# Patient Record
Sex: Female | Born: 1960
Health system: Southern US, Community
[De-identification: ages and names within clinical notes are randomized; demographics above are authoritative.]

## PROBLEM LIST (undated history)

## (undated) DIAGNOSIS — R079 Chest pain, unspecified: Secondary | ICD-10-CM

## (undated) DIAGNOSIS — K219 Gastro-esophageal reflux disease without esophagitis: Secondary | ICD-10-CM

## (undated) DIAGNOSIS — M51369 Other intervertebral disc degeneration, lumbar region without mention of lumbar back pain or lower extremity pain: Secondary | ICD-10-CM

## (undated) DIAGNOSIS — F419 Anxiety disorder, unspecified: Secondary | ICD-10-CM

## (undated) DIAGNOSIS — M25541 Pain in joints of right hand: Secondary | ICD-10-CM

## (undated) DIAGNOSIS — E039 Hypothyroidism, unspecified: Secondary | ICD-10-CM

## (undated) DIAGNOSIS — I10 Essential (primary) hypertension: Secondary | ICD-10-CM

## (undated) DIAGNOSIS — M5136 Other intervertebral disc degeneration, lumbar region: Secondary | ICD-10-CM

## (undated) DIAGNOSIS — F32A Depression, unspecified: Secondary | ICD-10-CM

## (undated) DIAGNOSIS — M542 Cervicalgia: Secondary | ICD-10-CM

## (undated) DIAGNOSIS — M549 Dorsalgia, unspecified: Secondary | ICD-10-CM

## (undated) DIAGNOSIS — R0602 Shortness of breath: Secondary | ICD-10-CM

## (undated) DIAGNOSIS — M25542 Pain in joints of left hand: Secondary | ICD-10-CM

## (undated) DIAGNOSIS — K5900 Constipation, unspecified: Secondary | ICD-10-CM

## (undated) DIAGNOSIS — M791 Myalgia, unspecified site: Secondary | ICD-10-CM

## (undated) DIAGNOSIS — R5383 Other fatigue: Secondary | ICD-10-CM

## (undated) DIAGNOSIS — F329 Major depressive disorder, single episode, unspecified: Secondary | ICD-10-CM

## (undated) HISTORY — DX: Chest pain, unspecified: R07.9

## (undated) HISTORY — DX: Major depressive disorder, single episode, unspecified: F32.9

## (undated) HISTORY — DX: Other intervertebral disc degeneration, lumbar region without mention of lumbar back pain or lower extremity pain: M51.369

## (undated) HISTORY — DX: Essential (primary) hypertension: I10

## (undated) HISTORY — DX: Dorsalgia, unspecified: M54.9

## (undated) HISTORY — DX: Myalgia, unspecified site: M79.10

## (undated) HISTORY — DX: Other fatigue: R53.83

## (undated) HISTORY — DX: Other intervertebral disc degeneration, lumbar region: M51.36

## (undated) HISTORY — DX: Morbid (severe) obesity due to excess calories: E66.01

## (undated) HISTORY — DX: Shortness of breath: R06.02

## (undated) HISTORY — DX: Depression, unspecified: F32.A

## (undated) HISTORY — PX: TUBAL LIGATION: SHX77

## (undated) HISTORY — DX: Pain in joints of right hand: M25.542

## (undated) HISTORY — DX: Constipation, unspecified: K59.00

## (undated) HISTORY — DX: Pain in joints of right hand: M25.541

## (undated) HISTORY — PX: KNEE SURGERY: SHX244

## (undated) HISTORY — DX: Anxiety disorder, unspecified: F41.9

## (undated) HISTORY — PX: CHOLECYSTECTOMY: SHX55

---

## 1999-10-05 ENCOUNTER — Emergency Department (HOSPITAL_COMMUNITY): Admission: EM | Admit: 1999-10-05 | Discharge: 1999-10-05 | Payer: Self-pay | Admitting: Emergency Medicine

## 2009-07-08 ENCOUNTER — Inpatient Hospital Stay: Payer: Self-pay | Admitting: Surgery

## 2009-08-22 ENCOUNTER — Inpatient Hospital Stay: Payer: Self-pay | Admitting: Internal Medicine

## 2009-10-15 ENCOUNTER — Ambulatory Visit: Payer: Self-pay | Admitting: Gastroenterology

## 2010-05-20 ENCOUNTER — Ambulatory Visit: Payer: Self-pay

## 2010-05-29 ENCOUNTER — Ambulatory Visit: Payer: Self-pay | Admitting: Family Medicine

## 2011-06-02 ENCOUNTER — Ambulatory Visit: Payer: Self-pay

## 2013-01-17 ENCOUNTER — Ambulatory Visit: Payer: Self-pay

## 2013-11-01 ENCOUNTER — Ambulatory Visit (INDEPENDENT_AMBULATORY_CARE_PROVIDER_SITE_OTHER): Payer: BC Managed Care – PPO | Admitting: Cardiovascular Disease

## 2013-11-01 ENCOUNTER — Encounter: Payer: Self-pay | Admitting: Cardiovascular Disease

## 2013-11-01 ENCOUNTER — Encounter (INDEPENDENT_AMBULATORY_CARE_PROVIDER_SITE_OTHER): Payer: Self-pay

## 2013-11-01 VITALS — BP 148/94 | HR 73 | Ht 64.0 in | Wt 180.5 lb

## 2013-11-01 VITALS — BP 140/82 | HR 68 | Ht 64.0 in | Wt 180.5 lb

## 2013-11-01 DIAGNOSIS — M542 Cervicalgia: Secondary | ICD-10-CM

## 2013-11-01 DIAGNOSIS — R5383 Other fatigue: Secondary | ICD-10-CM

## 2013-11-01 DIAGNOSIS — R5381 Other malaise: Secondary | ICD-10-CM

## 2013-11-01 DIAGNOSIS — R0602 Shortness of breath: Secondary | ICD-10-CM | POA: Insufficient documentation

## 2013-11-01 DIAGNOSIS — R000 Tachycardia, unspecified: Secondary | ICD-10-CM

## 2013-11-01 NOTE — Procedures (Signed)
Exercise Treadmill Test  Treadmill ordered for recent epsiodes of chest pain.  Resting EKG shows NSR with rate of 69 bpm, no significant ST or T wave changes Resting blood pressure of 148/94. Stand bruce protocal was used.  Patient exercised for 9 min,  Peak heart rate of 146 bpm.  This was 88% of the maximum predicted heart rate (target heart rate 141). Achieved 10.1 METS No symptoms of chest pain or lightheadedness were reported at peak stress or in recovery.  Peak Blood pressure recorded was 198/90 Heart rate at 3 minutes in recovery was 86 bpm. No significant ST changes concerning for ischemia.   FINAL IMPRESSION: Normal exercise stress test. No significant EKG changes concerning for ischemia. Good exercise tolerance.

## 2013-11-01 NOTE — Patient Instructions (Signed)
  No medication changes were made. For shortness of breath, fatigue, we will schedule a routine treadmill study   Please call us if you have new issues that need to be addressed before your next appt.

## 2013-11-01 NOTE — Assessment & Plan Note (Signed)
Neck pain sounds atypical in nature. Suggest she try Aleve or other NSAIDs. We'll order routine treadmill study to exclude ischemia

## 2013-11-01 NOTE — Progress Notes (Signed)
Patient ID: Emily [name], female    DOB: 1961-01-13, 53 y.o.   MRN: [id number]  HPI Comments: [name] is a very pleasant 53 year old woman with history of depression, hypertension, obesity is a patient of Dr. Dossie Arbourrissman who presents for evaluation of shortness of breath, pain in her neck and jaw.  She reports that her husband has a long history of coronary artery disease and given her symptoms over the past several weeks, he encouraged her to be evaluated in our office. She reports that over the past several weeks she has had episodes of neck pain on the left radiating up to her jaw. She denies any heavy lifting, no trauma to her neck. Denies having reproducible neck pain or jaw pain or chest pain with exertion. She does have shortness of breath but is uncertain if this is from weight gain of 25 pounds over the past year. Recently had a honeymoon and noticed shortness of breath on [place] trip to [place]. She also feels tired much of the time and is concerned her fatigue could be from heart disease.   She does report a significant family history. Father had MI in his 360s, other grandparents had heart attacks in their 4960s. Mother had stroke and heart failure in her 7070s  No significant smoking history apart from when she was very young, no diabetes history  EKG shows normal sinus rhythm with heart rates in the 60s, no significant ST or T wave changes   Outpatient Encounter Prescriptions as of 11/01/2013  Medication Sig  . levothyroxine (SYNTHROID, LEVOTHROID) 75 MCG tablet Take 75 mcg by mouth daily before breakfast.   . losartan (COZAAR) 50 MG tablet Take 50 mg by mouth daily.  . Nutritional Supplements (JUICE PLUS FIBRE PO) Take by mouth daily.  Marland Kitchen. venlafaxine XR (EFFEXOR-XR) 75 MG 24 hr capsule Take 75 mg by mouth daily with breakfast.     Review of Systems  Constitutional: Negative.   HENT: Negative.   Eyes: Negative.   Respiratory: Positive for shortness of breath.   Cardiovascular: Negative.    Gastrointestinal: Negative.   Endocrine: Negative.   Musculoskeletal: Positive for neck pain.  Skin: Negative.   Allergic/Immunologic: Negative.   Neurological: Negative.   Hematological: Negative.   Psychiatric/Behavioral: Negative.   All other systems reviewed and are negative.   BP 140/82  Pulse 68  Ht 5\' 4"  (1.626 m)  Wt 180 lb 8 oz (81.874 kg)  BMI 30.97 kg/m2  Physical Exam  Nursing note and vitals reviewed. Constitutional: She is oriented to person, place, and time. She appears well-developed and well-nourished.  HENT:  Head: Normocephalic.  Nose: Nose normal.  Mouth/Throat: Oropharynx is clear and moist.  Eyes: Conjunctivae are normal. Pupils are equal, round, and reactive to light.  Neck: Normal range of motion. Neck supple. No JVD present.  Cardiovascular: Normal rate, regular rhythm, S1 normal, S2 normal, normal heart sounds and intact distal pulses.  Exam reveals no gallop and no friction rub.   No murmur heard. Pulmonary/Chest: Effort normal and breath sounds normal. No respiratory distress. She has no wheezes. She has no rales. She exhibits no tenderness.  Abdominal: Soft. Bowel sounds are normal. She exhibits no distension. There is no tenderness.  Musculoskeletal: Normal range of motion. She exhibits no edema and no tenderness.  Lymphadenopathy:    She has no cervical adenopathy.  Neurological: She is alert and oriented to person, place, and time. Coordination normal.  Skin: Skin is warm and dry. No rash noted.  No erythema.  Psychiatric: She has a normal mood and affect. Her behavior is normal. Judgment and thought content normal.    Assessment and Plan

## 2013-11-01 NOTE — Patient Instructions (Signed)
You are doing well. No medication changes were made.  Your stress test did not show significant changes concerning for heart blockage. No further testing at this time.  Please call us if you have new issues that need to be addressed before your next appt.

## 2013-11-01 NOTE — Assessment & Plan Note (Signed)
Etiology of shortness of breath is unclear. She reports 25 pound weight gain over the past year. She does not do any regular exercise. Routine treadmill study has been ordered to exclude cardiac source of her shortness of breath. Clinical exam is essentially benign, EKG normal.

## 2013-11-01 NOTE — Assessment & Plan Note (Signed)
Etiology of her fatigue is unclear. She reports good sleep hygiene at nighttime. Uncertain if this could be from history of depression. Unable to exclude ischemia as a cause of her symptoms. We will do a routine treadmill study today to exclude cardiac etiology

## 2014-03-03 ENCOUNTER — Encounter: Payer: Self-pay | Admitting: Neurology

## 2014-03-03 ENCOUNTER — Ambulatory Visit (INDEPENDENT_AMBULATORY_CARE_PROVIDER_SITE_OTHER): Payer: BC Managed Care – PPO | Admitting: Neurology

## 2014-03-03 VITALS — BP 122/70 | HR 74 | Temp 98.0°F | Resp 18 | Ht 64.0 in | Wt 190.6 lb

## 2014-03-03 DIAGNOSIS — R413 Other amnesia: Secondary | ICD-10-CM

## 2014-03-03 DIAGNOSIS — R209 Unspecified disturbances of skin sensation: Secondary | ICD-10-CM

## 2014-03-03 DIAGNOSIS — G5710 Meralgia paresthetica, unspecified lower limb: Secondary | ICD-10-CM

## 2014-03-03 DIAGNOSIS — R200 Anesthesia of skin: Secondary | ICD-10-CM

## 2014-03-03 DIAGNOSIS — G5711 Meralgia paresthetica, right lower limb: Secondary | ICD-10-CM

## 2014-03-03 DIAGNOSIS — B0229 Other postherpetic nervous system involvement: Secondary | ICD-10-CM

## 2014-03-03 DIAGNOSIS — M2550 Pain in unspecified joint: Secondary | ICD-10-CM

## 2014-03-03 NOTE — Patient Instructions (Signed)
1.  We will check a vitamin B12 level. 2.  We will refer you for neuropsychological testing.  At this point I don't think you have dementia or cognitive impairment and it may be due to stress and anxiety. 3.  Recommend wearing wrist splints at night for hand numbness.  It may be carpal tunnel.  Can get them at any drug store. 4.  For the thigh numbness, recommend routine exercise and losing weight. 5.  Have rheumatologist send me his note. 6.  Will see you after the neuropsych testing.
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
## 2014-03-04 LAB — VITAMIN B12: Vitamin B-12: 546 pg/mL (ref 211–911)

## 2014-03-06 LAB — B. BURGDORFI ANTIBODIES: B burgdorferi Ab IgG+IgM: 0.45 {ISR}

## 2014-10-24 ENCOUNTER — Emergency Department: Payer: Self-pay | Admitting: Emergency Medicine

## 2014-10-24 LAB — BASIC METABOLIC PANEL
ANION GAP: 6 — AB (ref 7–16)
BUN: 21 mg/dL — ABNORMAL HIGH (ref 7–18)
CALCIUM: 8.8 mg/dL (ref 8.5–10.1)
CO2: 27 mmol/L (ref 21–32)
CREATININE: 0.76 mg/dL (ref 0.60–1.30)
Chloride: 106 mmol/L (ref 98–107)
GLUCOSE: 108 mg/dL — AB (ref 65–99)
Osmolality: 281 (ref 275–301)
Potassium: 3.9 mmol/L (ref 3.5–5.1)
SODIUM: 139 mmol/L (ref 136–145)

## 2014-10-24 LAB — CBC
HCT: 39.3 % (ref 35.0–47.0)
HGB: 13.1 g/dL (ref 12.0–16.0)
MCH: 31.3 pg (ref 26.0–34.0)
MCHC: 33.3 g/dL (ref 32.0–36.0)
MCV: 94 fL (ref 80–100)
Platelet: 211 10*3/uL (ref 150–440)
RBC: 4.18 10*6/uL (ref 3.80–5.20)
RDW: 13.2 % (ref 11.5–14.5)
WBC: 5.6 10*3/uL (ref 3.6–11.0)

## 2014-10-24 LAB — TROPONIN I: Troponin-I: <0.02 ng/mL

## 2014-11-17 ENCOUNTER — Ambulatory Visit: Payer: Self-pay | Admitting: Family Medicine

## 2014-12-28 ENCOUNTER — Ambulatory Visit (INDEPENDENT_AMBULATORY_CARE_PROVIDER_SITE_OTHER): Payer: BLUE CROSS/BLUE SHIELD

## 2014-12-28 ENCOUNTER — Ambulatory Visit (INDEPENDENT_AMBULATORY_CARE_PROVIDER_SITE_OTHER): Payer: BLUE CROSS/BLUE SHIELD | Admitting: Podiatry

## 2014-12-28 ENCOUNTER — Encounter: Payer: Self-pay | Admitting: Podiatry

## 2014-12-28 DIAGNOSIS — R52 Pain, unspecified: Secondary | ICD-10-CM

## 2014-12-28 DIAGNOSIS — M722 Plantar fascial fibromatosis: Secondary | ICD-10-CM

## 2014-12-28 MED ORDER — MELOXICAM 7.5 MG PO TABS: 7.5000 mg | ORAL_TABLET | Freq: Every day | ORAL | Status: DC

## 2014-12-28 MED ORDER — TRIAMCINOLONE ACETONIDE 10 MG/ML IJ SUSP
10.0000 mg | Freq: Once | INTRAMUSCULAR | Status: AC
  Administered 2014-12-29: 10 mg

## 2014-12-28 NOTE — Progress Notes (Signed)
   Subjective:    Patient ID: Emily [name], female    DOB: [birth year], 54 y.o.   MRN: [id number]  HPI 54 year old female presents the office today with her husband for bilateral heel pain which has been ongoing for approximately 3-4 months. She states that she has pain in the bottoms of her feet particularly the heel which is worse in the morning or after periods of rest which relieved by ambulation. 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 She denies any history of injury to the area or any change or increase in activity of the time of onset of symptoms. Denies any to wear numbness to her feet. The pain does not wake her up at night. She has tried stretching activities as well as soaking her foot without any resolution. Denies any swelling or redness to the area. No other complaints at this time.  Review of Systems  All other systems reviewed and are negative.      Objective:   Physical Exam AAO x3, NAD DP/PT pulses palpable bilaterally, CRT less than 3 seconds Protective sensation intact with Simms Weinstein monofilament, vibratory sensation intact, Achilles tendon reflex intact Tenderness to palpation overlying the plantar medial tubercle of the calcaneus to bilateral heels at the insertion of the plantar fascia. There is no pain along the course of plantar fascia within the arch of the foot and the plantar fascia appears intact bilaterally. There is no pain with lateral compression of the calcaneus or pain the vibratory sensation. No pain on the posterior aspect of the calcaneus or along the course/insertion of the Achilles tendon. There is no overlying edema, erythema, increase in warmth. No other areas of tenderness palpation or pain with vibratory sensation to the foot/ankle b/l. MMT 5/5, ROM WNL No open lesions or pre-ulcerative lesions are identified. No pain with calf compression, swelling, warmth, erythema.      Assessment  & Plan:  54 year old female with bilateral heel pain, likely plantar fasciitis. -X-rays were obtained and reviewed with the patient. -Treatment options were discussed with the patient including all alternatives, risks, complications. -Patient elects to proceed with steroid injection into the bilateral heels. Under sterile skin preparation, a total of 2.5cc of kenalog 10, 0.5% Marcaine plain, and 2% lidocaine plain were infiltrated into the symptomatic area without complication. A band-aid was applied. Patient tolerated the injection well without complication. Post-injection care with discussed with the patient. Discussed with the patient to ice the area over the next couple of days to help prevent a steroid flare.  -Plantar fascial tapings were applied. Also dispensed plantar fascial braces to wear once the tape is removed. -Prescribed meloxicam. Discussed side effects the medication directed to stop if any are to occur. -Continue stretching and icing activities. -Discussed shoe gear modifications. -Discussed orthotics. Will discuss more next appointment as symptoms resolve. -Follow-up in 3 weeks or sooner if any problems are to arise. In the meantime encouraged to call the office with any questions, concerns, change in symptoms.

## 2014-12-28 NOTE — Patient Instructions (Signed)
Plantar Fasciitis (Heel Spur Syndrome) with Rehab The plantar fascia is a fibrous, ligament-like, soft-tissue structure that spans the bottom of the foot. Plantar fasciitis is a condition that causes pain in the foot due to inflammation of the tissue. SYMPTOMS   Pain and tenderness on the underneath side of the foot.  Pain that worsens with standing or walking. CAUSES  Plantar fasciitis is caused by irritation and injury to the plantar fascia on the underneath side of the foot. Common mechanisms of injury include:  Direct trauma to bottom of the foot.  Damage to a small nerve that runs under the foot where the main fascia attaches to the heel bone.  Stress placed on the plantar fascia due to bone spurs. RISK INCREASES WITH:   Activities that place stress on the plantar fascia (running, jumping, pivoting, or cutting).  Poor strength and flexibility.  Improperly fitted shoes.  Tight calf muscles.  Flat feet.  Failure to warm-up properly before activity.  Obesity. PREVENTION  Warm up and stretch properly before activity.  Allow for adequate recovery between workouts.  Maintain physical fitness:  Strength, flexibility, and endurance.  Cardiovascular fitness.  Maintain a health body weight.  Avoid stress on the plantar fascia.  Wear properly fitted shoes, including arch supports for individuals who have flat feet. PROGNOSIS  If treated properly, then the symptoms of plantar fasciitis usually resolve without surgery. However, occasionally surgery is necessary. RELATED COMPLICATIONS   Recurrent symptoms that may result in a chronic condition.  Problems of the lower back that are caused by compensating for the injury, such as limping.  Pain or weakness of the foot during push-off following surgery.  Chronic inflammation, scarring, and partial or complete fascia tear, occurring more often from repeated injections. TREATMENT  Treatment initially involves the use of  ice and medication to help reduce pain and inflammation. The use of strengthening and stretching exercises may help reduce pain with activity, especially stretches of the Achilles tendon. These exercises may be performed at home or with a therapist. Your caregiver may recommend that you use heel cups of arch supports to help reduce stress on the plantar fascia. Occasionally, corticosteroid injections are given to reduce inflammation. If symptoms persist for greater than 6 months despite non-surgical (conservative), then surgery may be recommended.  MEDICATION   If pain medication is necessary, then nonsteroidal anti-inflammatory medications, such as aspirin and ibuprofen, or other minor pain relievers, such as acetaminophen, are often recommended.  Do not take pain medication within 7 days before surgery.  Prescription pain relievers may be given if deemed necessary by your caregiver. Use only as directed and only as much as you need.  Corticosteroid injections may be given by your caregiver. These injections should be reserved for the most serious cases, because they may only be given a certain number of times. HEAT AND COLD  Cold treatment (icing) relieves pain and reduces inflammation. Cold treatment should be applied for 10 to 15 minutes every 2 to 3 hours for inflammation and pain and immediately after any activity that aggravates your symptoms. Use ice packs or massage the area with a piece of ice (ice massage).  Heat treatment may be used prior to performing the stretching and strengthening activities prescribed by your caregiver, physical therapist, or athletic trainer. Use a heat pack or soak the injury in warm water. SEEK IMMEDIATE MEDICAL CARE IF:  Treatment seems to offer no benefit, or the condition worsens.  Any medications produce adverse side effects. EXERCISES RANGE   OF MOTION (ROM) AND STRETCHING EXERCISES - Plantar Fasciitis (Heel Spur Syndrome) These exercises may help you  when beginning to rehabilitate your injury. Your symptoms may resolve with or without further involvement from your physician, physical therapist or athletic trainer. While completing these exercises, remember:   Restoring tissue flexibility helps normal motion to return to the joints. This allows healthier, less painful movement and activity.  An effective stretch should be held for at least 30 seconds.  A stretch should never be painful. You should only feel a gentle lengthening or release in the stretched tissue. RANGE OF MOTION - Toe Extension, Flexion  Sit with your right / left leg crossed over your opposite knee.  Grasp your toes and gently pull them back toward the top of your foot. You should feel a stretch on the bottom of your toes and/or foot.  Hold this stretch for __________ seconds.  Now, gently pull your toes toward the bottom of your foot. You should feel a stretch on the top of your toes and or foot.  Hold this stretch for __________ seconds. Repeat __________ times. Complete this stretch __________ times per day.  RANGE OF MOTION - Ankle Dorsiflexion, Active Assisted  Remove shoes and sit on a chair that is preferably not on a carpeted surface.  Place right / left foot under knee. Extend your opposite leg for support.  Keeping your heel down, slide your right / left foot back toward the chair until you feel a stretch at your ankle or calf. If you do not feel a stretch, slide your bottom forward to the edge of the chair, while still keeping your heel down.  Hold this stretch for __________ seconds. Repeat __________ times. Complete this stretch __________ times per day.  STRETCH - Gastroc, Standing  Place hands on wall.  Extend right / left leg, keeping the front knee somewhat bent.  Slightly point your toes inward on your back foot.  Keeping your right / left heel on the floor and your knee straight, shift your weight toward the wall, not allowing your back to  arch.  You should feel a gentle stretch in the right / left calf. Hold this position for __________ seconds. Repeat __________ times. Complete this stretch __________ times per day. STRETCH - Soleus, Standing  Place hands on wall.  Extend right / left leg, keeping the other knee somewhat bent.  Slightly point your toes inward on your back foot.  Keep your right / left heel on the floor, bend your back knee, and slightly shift your weight over the back leg so that you feel a gentle stretch deep in your back calf.  Hold this position for __________ seconds. Repeat __________ times. Complete this stretch __________ times per day. STRETCH - Gastrocsoleus, Standing  Note: This exercise can place a lot of stress on your foot and ankle. Please complete this exercise only if specifically instructed by your caregiver.   Place the ball of your right / left foot on a step, keeping your other foot firmly on the same step.  Hold on to the wall or a rail for balance.  Slowly lift your other foot, allowing your body weight to press your heel down over the edge of the step.  You should feel a stretch in your right / left calf.  Hold this position for __________ seconds.  Repeat this exercise with a slight bend in your right / left knee. Repeat __________ times. Complete this stretch __________ times per day.    STRENGTHENING EXERCISES - Plantar Fasciitis (Heel Spur Syndrome)  These exercises may help you when beginning to rehabilitate your injury. They may resolve your symptoms with or without further involvement from your physician, physical therapist or athletic trainer. While completing these exercises, remember:   Muscles can gain both the endurance and the strength needed for everyday activities through controlled exercises.  Complete these exercises as instructed by your physician, physical therapist or athletic trainer. Progress the resistance and repetitions only as guided. STRENGTH -  Towel Curls  Sit in a chair positioned on a non-carpeted surface.  Place your foot on a towel, keeping your heel on the floor.  Pull the towel toward your heel by only curling your toes. Keep your heel on the floor.  If instructed by your physician, physical therapist or athletic trainer, add ____________________ at the end of the towel. Repeat __________ times. Complete this exercise __________ times per day. STRENGTH - Ankle Inversion  Secure one end of a rubber exercise band/tubing to a fixed object (table, pole). Loop the other end around your foot just before your toes.  Place your fists between your knees. This will focus your strengthening at your ankle.  Slowly, pull your big toe up and in, making sure the band/tubing is positioned to resist the entire motion.  Hold this position for __________ seconds.  Have your muscles resist the band/tubing as it slowly pulls your foot back to the starting position. Repeat __________ times. Complete this exercises __________ times per day.  Document Released: 10/06/2005 Document Revised: 12/29/2011 Document Reviewed: 01/18/2009 [employer] Patient Information 2015 [employer]. This information is not intended to replace advice given to you by your health care provider. Make sure you discuss any questions you have with your health care provider.  

## 2014-12-29 DIAGNOSIS — M722 Plantar fascial fibromatosis: Secondary | ICD-10-CM | POA: Diagnosis not present

## 2015-01-18 ENCOUNTER — Encounter: Payer: Self-pay | Admitting: Podiatry

## 2015-01-18 ENCOUNTER — Ambulatory Visit (INDEPENDENT_AMBULATORY_CARE_PROVIDER_SITE_OTHER): Payer: BLUE CROSS/BLUE SHIELD | Admitting: Podiatry

## 2015-01-18 VITALS — BP 127/68 | HR 85 | Resp 16

## 2015-01-18 DIAGNOSIS — M722 Plantar fascial fibromatosis: Secondary | ICD-10-CM | POA: Diagnosis not present

## 2015-01-18 MED ORDER — TRIAMCINOLONE ACETONIDE 10 MG/ML IJ SUSP
10.0000 mg | Freq: Once | INTRAMUSCULAR | Status: AC
  Administered 2015-01-18: 10 mg

## 2015-01-18 NOTE — Progress Notes (Signed)
Patient ID: Emily [name], female   DOB: 1961/04/16, 54 y.o.   MRN: [id number]  Subjective: 54 year old female presents the office today follow-up evaluation of bilateral heel pain, plantar fasciitis. She states that since last appointment she does have some reduction symptoms her she does continue to have pain. She says that she has pain in the morning or after periods of rest which is relieved by ambulation. Denies any numbness or tingling. Denies any swelling or redness. She's continue the stretching, icing, plantar fascial brace, anti-inflammatory. No other complaints at this time.  Objective: AAO x3, NAD DP/PT pulses palpable bilaterally, CRT less than 3 seconds Protective sensation intact with Simms Weinstein monofilament, vibratory sensation intact, Achilles There is continued tenderness to palpation overlying the plantar medial tubercle of the calcaneus to bilateral heels with the left > right at the insertion of the plantar fascia, although it does appear improved. There is no pain along the course of plantar fascia within the arch of the foot. There is no pain with lateral compression of the calcaneus or pain the vibratory sensation. No pain on the posterior aspect of the calcaneus or along the course/insertion of the Achilles tendon. There is mild equinus bilaterally.There is no overlying edema, erythema, increase in warmth. No other areas of tenderness palpation or pain with vibratory sensation to the foot/ankle. MMT 5/5, ROM WNL No open lesions or pre-ulcerative lesions are identified. No pain with calf compression, swelling, warmth, erythema.  Assessment: 54 year old female with continued heel pain bilaterally although resolving (left greater than right).  Plan: -Treatment options were discussed including alternatives, risks, complications. -Patient elects to proceed with steroid injection into the bilateral heels. Under sterile skin preparation, a total of 2.5cc of kenalog 10,  0.5% Marcaine plain, and 2% lidocaine plain were infiltrated into the symptomatic area without complication. A band-aid was applied. Patient tolerated the injection well without complication. Post-injection care with discussed with the patient. Discussed with the patient to ice the area over the next couple of days to help prevent a steroid flare.  -Plantar fascial taken and applied. At the same was removed she can continue with the plantar fascial brace. -Continue anti-inflammatory -Ice to the area -Continue stretching activities -Dispensed night splint -Discussed shoe gear modifications -Follow up in 3 weeks or sooner if any problems are to arise. In the meantime encouraged to call the office with any questions, concerns, change in symptoms.

## 2015-01-18 NOTE — Patient Instructions (Signed)
Plantar Fasciitis (Heel Spur Syndrome) with Rehab The plantar fascia is a fibrous, ligament-like, soft-tissue structure that spans the bottom of the foot. Plantar fasciitis is a condition that causes pain in the foot due to inflammation of the tissue. SYMPTOMS   Pain and tenderness on the underneath side of the foot.  Pain that worsens with standing or walking. CAUSES  Plantar fasciitis is caused by irritation and injury to the plantar fascia on the underneath side of the foot. Common mechanisms of injury include:  Direct trauma to bottom of the foot.  Damage to a small nerve that runs under the foot where the main fascia attaches to the heel bone.  Stress placed on the plantar fascia due to bone spurs. RISK INCREASES WITH:   Activities that place stress on the plantar fascia (running, jumping, pivoting, or cutting).  Poor strength and flexibility.  Improperly fitted shoes.  Tight calf muscles.  Flat feet.  Failure to warm-up properly before activity.  Obesity. PREVENTION  Warm up and stretch properly before activity.  Allow for adequate recovery between workouts.  Maintain physical fitness:  Strength, flexibility, and endurance.  Cardiovascular fitness.  Maintain a health body weight.  Avoid stress on the plantar fascia.  Wear properly fitted shoes, including arch supports for individuals who have flat feet. PROGNOSIS  If treated properly, then the symptoms of plantar fasciitis usually resolve without surgery. However, occasionally surgery is necessary. RELATED COMPLICATIONS   Recurrent symptoms that may result in a chronic condition.  Problems of the lower back that are caused by compensating for the injury, such as limping.  Pain or weakness of the foot during push-off following surgery.  Chronic inflammation, scarring, and partial or complete fascia tear, occurring more often from repeated injections. TREATMENT  Treatment initially involves the use of  ice and medication to help reduce pain and inflammation. The use of strengthening and stretching exercises may help reduce pain with activity, especially stretches of the Achilles tendon. These exercises may be performed at home or with a therapist. Your caregiver may recommend that you use heel cups of arch supports to help reduce stress on the plantar fascia. Occasionally, corticosteroid injections are given to reduce inflammation. If symptoms persist for greater than 6 months despite non-surgical (conservative), then surgery may be recommended.  MEDICATION   If pain medication is necessary, then nonsteroidal anti-inflammatory medications, such as aspirin and ibuprofen, or other minor pain relievers, such as acetaminophen, are often recommended.  Do not take pain medication within 7 days before surgery.  Prescription pain relievers may be given if deemed necessary by your caregiver. Use only as directed and only as much as you need.  Corticosteroid injections may be given by your caregiver. These injections should be reserved for the most serious cases, because they may only be given a certain number of times. HEAT AND COLD  Cold treatment (icing) relieves pain and reduces inflammation. Cold treatment should be applied for 10 to 15 minutes every 2 to 3 hours for inflammation and pain and immediately after any activity that aggravates your symptoms. Use ice packs or massage the area with a piece of ice (ice massage).  Heat treatment may be used prior to performing the stretching and strengthening activities prescribed by your caregiver, physical therapist, or athletic trainer. Use a heat pack or soak the injury in warm water. SEEK IMMEDIATE MEDICAL CARE IF:  Treatment seems to offer no benefit, or the condition worsens.  Any medications produce adverse side effects. EXERCISES RANGE   OF MOTION (ROM) AND STRETCHING EXERCISES - Plantar Fasciitis (Heel Spur Syndrome) These exercises may help you  when beginning to rehabilitate your injury. Your symptoms may resolve with or without further involvement from your physician, physical therapist or athletic trainer. While completing these exercises, remember:   Restoring tissue flexibility helps normal motion to return to the joints. This allows healthier, less painful movement and activity.  An effective stretch should be held for at least 30 seconds.  A stretch should never be painful. You should only feel a gentle lengthening or release in the stretched tissue. RANGE OF MOTION - Toe Extension, Flexion  Sit with your right / left leg crossed over your opposite knee.  Grasp your toes and gently pull them back toward the top of your foot. You should feel a stretch on the bottom of your toes and/or foot.  Hold this stretch for __________ seconds.  Now, gently pull your toes toward the bottom of your foot. You should feel a stretch on the top of your toes and or foot.  Hold this stretch for __________ seconds. Repeat __________ times. Complete this stretch __________ times per day.  RANGE OF MOTION - Ankle Dorsiflexion, Active Assisted  Remove shoes and sit on a chair that is preferably not on a carpeted surface.  Place right / left foot under knee. Extend your opposite leg for support.  Keeping your heel down, slide your right / left foot back toward the chair until you feel a stretch at your ankle or calf. If you do not feel a stretch, slide your bottom forward to the edge of the chair, while still keeping your heel down.  Hold this stretch for __________ seconds. Repeat __________ times. Complete this stretch __________ times per day.  STRETCH - Gastroc, Standing  Place hands on wall.  Extend right / left leg, keeping the front knee somewhat bent.  Slightly point your toes inward on your back foot.  Keeping your right / left heel on the floor and your knee straight, shift your weight toward the wall, not allowing your back to  arch.  You should feel a gentle stretch in the right / left calf. Hold this position for __________ seconds. Repeat __________ times. Complete this stretch __________ times per day. STRETCH - Soleus, Standing  Place hands on wall.  Extend right / left leg, keeping the other knee somewhat bent.  Slightly point your toes inward on your back foot.  Keep your right / left heel on the floor, bend your back knee, and slightly shift your weight over the back leg so that you feel a gentle stretch deep in your back calf.  Hold this position for __________ seconds. Repeat __________ times. Complete this stretch __________ times per day. STRETCH - Gastrocsoleus, Standing  Note: This exercise can place a lot of stress on your foot and ankle. Please complete this exercise only if specifically instructed by your caregiver.   Place the ball of your right / left foot on a step, keeping your other foot firmly on the same step.  Hold on to the wall or a rail for balance.  Slowly lift your other foot, allowing your body weight to press your heel down over the edge of the step.  You should feel a stretch in your right / left calf.  Hold this position for __________ seconds.  Repeat this exercise with a slight bend in your right / left knee. Repeat __________ times. Complete this stretch __________ times per day.    STRENGTHENING EXERCISES - Plantar Fasciitis (Heel Spur Syndrome)  These exercises may help you when beginning to rehabilitate your injury. They may resolve your symptoms with or without further involvement from your physician, physical therapist or athletic trainer. While completing these exercises, remember:   Muscles can gain both the endurance and the strength needed for everyday activities through controlled exercises.  Complete these exercises as instructed by your physician, physical therapist or athletic trainer. Progress the resistance and repetitions only as guided. STRENGTH -  Towel Curls  Sit in a chair positioned on a non-carpeted surface.  Place your foot on a towel, keeping your heel on the floor.  Pull the towel toward your heel by only curling your toes. Keep your heel on the floor.  If instructed by your physician, physical therapist or athletic trainer, add ____________________ at the end of the towel. Repeat __________ times. Complete this exercise __________ times per day. STRENGTH - Ankle Inversion  Secure one end of a rubber exercise band/tubing to a fixed object (table, pole). Loop the other end around your foot just before your toes.  Place your fists between your knees. This will focus your strengthening at your ankle.  Slowly, pull your big toe up and in, making sure the band/tubing is positioned to resist the entire motion.  Hold this position for __________ seconds.  Have your muscles resist the band/tubing as it slowly pulls your foot back to the starting position. Repeat __________ times. Complete this exercises __________ times per day.  Document Released: 10/06/2005 Document Revised: 12/29/2011 Document Reviewed: 01/18/2009 [employer] Patient Information 2015 [employer]. This information is not intended to replace advice given to you by your health care provider. Make sure you discuss any questions you have with your health care provider.  

## 2015-02-08 ENCOUNTER — Ambulatory Visit: Payer: BLUE CROSS/BLUE SHIELD | Admitting: Podiatry

## 2015-03-27 ENCOUNTER — Telehealth: Payer: Self-pay

## 2015-03-27 NOTE — Telephone Encounter (Signed)
Venlafaxine 150 mg Capsules

## 2015-03-28 MED ORDER — VENLAFAXINE HCL ER 75 MG PO CP24: 75.0000 mg | ORAL_CAPSULE | Freq: Every day | ORAL | Status: DC

## 2015-03-28 NOTE — Telephone Encounter (Signed)
Refilled

## 2015-04-12 DIAGNOSIS — F419 Anxiety disorder, unspecified: Secondary | ICD-10-CM

## 2015-04-12 DIAGNOSIS — I159 Secondary hypertension, unspecified: Secondary | ICD-10-CM

## 2015-04-12 DIAGNOSIS — F339 Major depressive disorder, recurrent, unspecified: Secondary | ICD-10-CM | POA: Insufficient documentation

## 2015-04-12 DIAGNOSIS — M791 Myalgia, unspecified site: Secondary | ICD-10-CM

## 2015-04-12 DIAGNOSIS — L409 Psoriasis, unspecified: Secondary | ICD-10-CM

## 2015-04-12 DIAGNOSIS — F32A Depression, unspecified: Secondary | ICD-10-CM

## 2015-04-12 DIAGNOSIS — F329 Major depressive disorder, single episode, unspecified: Secondary | ICD-10-CM

## 2015-04-12 DIAGNOSIS — M1990 Unspecified osteoarthritis, unspecified site: Secondary | ICD-10-CM | POA: Insufficient documentation

## 2015-04-12 DIAGNOSIS — I10 Essential (primary) hypertension: Secondary | ICD-10-CM | POA: Insufficient documentation

## 2015-04-13 ENCOUNTER — Ambulatory Visit (INDEPENDENT_AMBULATORY_CARE_PROVIDER_SITE_OTHER): Payer: BLUE CROSS/BLUE SHIELD | Admitting: Unknown Physician Specialty

## 2015-04-13 ENCOUNTER — Encounter: Payer: Self-pay | Admitting: Unknown Physician Specialty

## 2015-04-13 VITALS — BP 141/82 | HR 88 | Temp 98.5°F | Ht 63.0 in | Wt 173.0 lb

## 2015-04-13 DIAGNOSIS — K219 Gastro-esophageal reflux disease without esophagitis: Secondary | ICD-10-CM | POA: Diagnosis not present

## 2015-04-13 DIAGNOSIS — K5900 Constipation, unspecified: Secondary | ICD-10-CM

## 2015-04-13 MED ORDER — OMEPRAZOLE 20 MG PO CPDR: 20.0000 mg | DELAYED_RELEASE_CAPSULE | Freq: Every day | ORAL | Status: DC

## 2015-04-13 NOTE — Patient Instructions (Signed)
f you have chronic pain and are looking for alternatives to medication and surgery, you have a lot of options.   However, not all alternative pain treatments work. Some can even be risky. Some alternative treatments may help with pain from bad backs, osteoarthritis, and headaches, but have no effect on chronic pain from fibromyalgia or diabetic nerve damage.  Here's a rundown of the most commonly used alternative treatments for chronic pain.  Acupuncture. Once seen as bizarre, acupuncture is rapidly becoming a mainstream treatment for pain. Studies have found that it works for pain caused by many conditions, including fibromyalgia, osteoarthritis, back injuries, and sports injuries. How does it work? No one's quite sure. It could release pain-numbing chemicals in the body. Or it might block the pain signals coming from the nerves.  Exercise. Motion is lotion Going for a walk or swim isn't a treatment, exactly. But regular physical activity has big benefits for people with many different painful conditions. Study after study has found that physical activity can help relieve chronic pain, as well as boost energy and mood.   This is particularly true of pain related to arthritis .    Chiropractic manipulation. Although mainstream medicine has traditionally regarded spinal manipulation with suspicion, it's becoming a more accepted treatment.  I think many people respond will th chiropractic treatment.    Supplements and vitamins. There is evidence that certain dietary supplements and vitamins can help with certain types of pain. Fish oil and flaxseed oil is often used to reduce pain associated with swelling. Topical capsaicin, derived from chili peppers, may help with arthritis, diabetic nerve pain, and other conditions. There's evidence that glucosamine can help relieve moderate to severe pain from osteoarthritis in the knee.  A spice Tumeric is used my many to treat chronic pain.  Curcumin is the active  ingredient and thought to have anti-inflammatory effects and reduces pain and stiffness.  This can be found as capsules or extract (more likely to be free of contaminants). For OA: Capsule, typically 400 mg to 600 mg, three times per day; or 0.5 g to 1 g of powdered root up to 3 g per day. For RA: 500 mg twice daily.  It's best absorbed if it contains black pepper.    But when it comes to supplements, you have to be careful. High doses of B6 can damage the nerves.   Some studies suggest that supplements such as ginkgo biloba and ginseng can thin the blood and increase the risk of bleeding. This could lead to serious consequences for anyone getting surgery for chronic pain.  Finally, supplements don't always contain what they claim as supplement manufacturers are not regulated by the FDA.  I get very confused with the thousands of supplements available and which to choose.  Brands to consider: [employer], [employer], [employer], [employer], [employer], [employer] and [employer]   I usually go on Amazon and look for the highly rated products.  [employer] or [employer] are great resources and have done the research for you.     Cognitive Behavioral Therapy. Some people with chronic pain balk at the idea of seeing a therapist -- they think it implies that their pain isn't real. But studies show that depression and chronic pain often go together. Chronic pain can cause or worsen depression; depression can lower a person's tolerance for pain.  Stress-reduction techniques. There are number of approaches, including: Yoga. There's good evidence that yoga can help with chronic pain,    specifically fibromyalgia, neck pain, back pain, and arthritis. Relaxation therapy. This is actually a category of techniques that help people calm the body and release tension -- a process that might also reduce pain. Some approaches teach people how to focus on their breathing. Research shows that relaxation  therapy can help with fibromyalgia, headache, osteoarthritis, and other conditions. Hypnosis. Studies have found this approach helpful with different sorts of pain, like back pain, repetitive strain injuries, and cancer pain. Guided imagery. Research shows that guided imagery can help with conditions like headache pain, cancer pain, osteoarthritis, and fibromyalgia. How does it work? An expert would teach you ways to direct your thoughts by focusing on specific images. Music therapy. This approach gets people to either perform or listen to music. Studies have found that it can help with many different pain conditions, like osteoarthritis and cancer pain. Biofeedback. This approach teaches you how to control normally unconscious bodily functions, like blood pressure or your heart rate. Studies have found that it can help with headaches, fibromyalgia, and other conditions. Massage. It's undeniably relaxing. And there's some evidence that massage can help ease pain from rheumatoid arthritis, neck and back injuries, and fibromyalgia.  Risky Alternative Pain Treatments Experts say you should keep your expectations for alternative pain treatments modest -- especially when it comes to "miracle cures." Controlling chronic pain is not simple. A single supplement, device, or treatment is not going to make your chronic pain disappear. You a need to be suspicious of anyone pushing a treatment when the financial motive is blatant. That doesn't only apply to pain treatments advertised on dubious web sites asking for your credit card number.  Experts say that you should try to keep up to date with research into alternative treatments for chronic pain. The options for people with chronic pain are always growing -- and some of the odder treatments of today might become the mainstream treatments of tomorrow.   

## 2015-04-13 NOTE — Progress Notes (Signed)
   BP 141/82 mmHg  Pulse 88  Temp(Src) 98.5 F (36.9 C) (Oral)  Ht 5\' 3"  (1.6 m)  Wt 173 lb (78.472 kg)  BMI 30.65 kg/m2   Subjective:    Patient ID: Emily [name], female    DOB: 03/24/1961, 54 y.o.   MRN: [id number]  HPI: Emily [name] is a 54 y.o. female  Chief Complaint  Patient presents with  . Abdominal Pain    Top of stomach and goes around the right to the middle of back and shoulder blades   Abdominal Pain This is a new problem. The current episode started in the past 7 days. The onset quality is sudden. The problem occurs daily. The problem has been unchanged. The pain is located in the epigastric region. The quality of the pain is burning. The abdominal pain radiates to the back. Associated symptoms include belching, constipation and nausea. Exacerbated by: food. The pain is relieved by nothing. She has tried antacids for the symptoms. The treatment provided mild relief.     Relevant past medical, surgical, family and social history reviewed and updated as indicated. Interim medical history since our last visit reviewed. Allergies and medications reviewed and updated.  Review of Systems  Gastrointestinal: Positive for nausea, abdominal pain and constipation.    Per HPI unless specifically indicated above     Objective:    BP 141/82 mmHg  Pulse 88  Temp(Src) 98.5 F (36.9 C) (Oral)  Ht 5\' 3"  (1.6 m)  Wt 173 lb (78.472 kg)  BMI 30.65 kg/m2  Wt Readings from Last 3 Encounters:  04/13/15 173 lb (78.472 kg)  11/27/14 175 lb (79.379 kg)  03/03/14 190 lb 9.6 oz (86.456 kg)    Physical Exam  Constitutional: She is oriented to person, place, and time. She appears well-developed and well-nourished. No distress.  HENT:  Head: Normocephalic and atraumatic.  Eyes: Conjunctivae and lids are normal. Right eye exhibits no discharge. Left eye exhibits no discharge. No scleral icterus.  Cardiovascular: Normal rate and regular rhythm.   Pulmonary/Chest:  Effort normal. No respiratory distress.  Abdominal: Soft. Normal appearance and bowel sounds are normal. She exhibits no distension. There is no splenomegaly or hepatomegaly. There is tenderness in the epigastric area. There is no rigidity, no rebound, no guarding and no CVA tenderness.  Musculoskeletal: Normal range of motion.  Neurological: She is alert and oriented to person, place, and time.  Skin: Skin is intact. No rash noted. No pallor.  Psychiatric: She has a normal mood and affect. Her behavior is normal. Judgment and thought content normal.      Assessment & Plan:   Problem List Items Addressed This Visit    None    Visit Diagnoses    Gastroesophageal reflux disease without esophagitis    -  Primary    Rx for Omeprazole.  Encouraged weaning off the Meloxicam    Relevant Medications    omeprazole (PRILOSEC) 20 MG capsule    Constipation, unspecified constipation type        Probably contributing to GERD. Discussed taking Miralax        Follow up plan: Return in about 4 weeks (around 05/11/2015) for Appt with Dr Laural Benes.

## 2015-04-17 ENCOUNTER — Encounter: Payer: Self-pay | Admitting: Family Medicine

## 2015-04-17 ENCOUNTER — Ambulatory Visit (INDEPENDENT_AMBULATORY_CARE_PROVIDER_SITE_OTHER): Payer: BLUE CROSS/BLUE SHIELD | Admitting: Family Medicine

## 2015-04-17 VITALS — BP 135/79 | HR 75 | Temp 98.0°F | Ht 62.5 in | Wt 173.4 lb

## 2015-04-17 DIAGNOSIS — F32A Depression, unspecified: Secondary | ICD-10-CM

## 2015-04-17 DIAGNOSIS — F329 Major depressive disorder, single episode, unspecified: Secondary | ICD-10-CM | POA: Diagnosis not present

## 2015-04-17 MED ORDER — VENLAFAXINE HCL ER 150 MG PO CP24: 150.0000 mg | ORAL_CAPSULE | Freq: Every day | ORAL | Status: DC

## 2015-04-17 NOTE — Assessment & Plan Note (Signed)
Has been on the lower dose of her medicine again and is feeling the difference. Refill of her  of her effexor sent to the pharmacy. Continue to monitor. Call with any problems.

## 2015-04-17 NOTE — Progress Notes (Signed)
BP 135/79 mmHg  Pulse 75  Temp(Src) 98 F (36.7 C) (Oral)  Ht 5' 2.5" (1.588 m)  Wt 173 lb 6.4 oz (78.654 kg)  BMI 31.19 kg/m2  SpO2 97%   Subjective:    Patient ID: Emily [name], female    DOB: 25-Apr-1961, 54 y.o.   MRN: [id number]  HPI: Emily [name] is a 54 y.o. female  Chief Complaint  Patient presents with  . FMLA paperwork   Her father has alzheimers and her step mom is 83yo and may need open heart surgery. Thinks that she may need to take care of her dad when his wife is not able to. She needs her FMLA paperwork filled out again. She is otherwise doing well. She does note that she is on the lower dose of her medicine again and doesn't find that it is as helpful.   DEPRESSION Mood status: better Satisfied with current treatment?: yes Symptom severity: mild  Duration of current treatment : chronic Side effects: no Medication compliance: excellent compliance but last Rx came back at  and she was doing better on the 150 Psychotherapy/counseling: no  Depressed mood: yes Anxious mood: yes Anhedonia: no Significant weight loss or gain: no Insomnia: yes  Fatigue: yes Feelings of worthlessness or guilt: no Impaired concentration/indecisiveness: no Suicidal ideations: no Hopelessness: no Crying spells: no  Relevant past medical, surgical, family and social history reviewed and updated as indicated. Interim medical history since our last visit reviewed. Allergies and medications reviewed and updated.  Review of Systems  Constitutional: Negative.   Respiratory: Negative.   Cardiovascular: Negative.   Neurological: Negative.   Psychiatric/Behavioral: Negative.    Per HPI unless specifically indicated above     Objective:    BP 135/79 mmHg  Pulse 75  Temp(Src) 98 F (36.7 C) (Oral)  Ht 5' 2.5" (1.588 m)  Wt 173 lb 6.4 oz (78.654 kg)  BMI 31.19 kg/m2  SpO2 97%  Wt Readings from Last 3 Encounters:  04/17/15 173 lb 6.4 oz (78.654 kg)   04/13/15 173 lb (78.472 kg)  11/27/14 175 lb (79.379 kg)    Physical Exam  Constitutional: She is oriented to person, place, and time. She appears well-developed and well-nourished. No distress.  HENT:  Head: Normocephalic and atraumatic.  Right Ear: Hearing normal.  Left Ear: Hearing normal.  Nose: Nose normal.  Eyes: Conjunctivae and lids are normal. Right eye exhibits no discharge. Left eye exhibits no discharge. No scleral icterus.  Pulmonary/Chest: Effort normal. No respiratory distress.  Musculoskeletal: Normal range of motion.  Neurological: She is alert and oriented to person, place, and time.  Skin: Skin is warm and intact. No rash noted. No erythema. No pallor.  Psychiatric: Her speech is normal and behavior is normal. Judgment and thought content normal. Cognition and memory are normal.  Flat affect       Assessment & Plan:   Problem List Items Addressed This Visit      Other   Depression - Primary    Has been on the lower dose of her medicine again and is feeling the difference. Refill of her  of her effexor sent to the pharmacy. Continue to monitor. Call with any problems.       Relevant Medications   venlafaxine XR (EFFEXOR-XR) 150 MG 24 hr capsule    FMLA paperwork filled out for patient and give to her to take to work. Copy made and put in to be scanned into the system.    Follow  up plan: Return in about 6 months (around 10/17/2015).

## 2015-05-18 ENCOUNTER — Encounter: Payer: Self-pay | Admitting: Family Medicine

## 2015-05-18 ENCOUNTER — Ambulatory Visit (INDEPENDENT_AMBULATORY_CARE_PROVIDER_SITE_OTHER): Payer: BLUE CROSS/BLUE SHIELD | Admitting: Family Medicine

## 2015-05-18 VITALS — BP 132/70 | HR 76 | Temp 98.6°F | Wt 174.8 lb

## 2015-05-18 DIAGNOSIS — Z636 Dependent relative needing care at home: Secondary | ICD-10-CM | POA: Insufficient documentation

## 2015-05-18 NOTE — Assessment & Plan Note (Signed)
FMLA filled out for care of her father who has Alzheimer's and stepmother who is going to have open heart surgery. Faxed into her work.

## 2015-05-18 NOTE — Progress Notes (Signed)
  BP 132/70 mmHg  Pulse 76  Temp(Src) 98.6 F (37 C)  Wt 174 lb 12.8 oz (79.289 kg)  SpO2 98%   Subjective:    Patient ID: Emily [name], female    DOB: Jun 17, 1961, 54 y.o.   MRN: [id number]  HPI: Siya Flurry is a 54 y.o. female  Chief Complaint  Patient presents with  . FMLA    Relevant past medical, surgical, family and social history reviewed and updated as indicated. Interim medical history since our last visit reviewed. Allergies and medications reviewed and updated.  Review of Systems  Constitutional: Negative.   Respiratory: Negative.   Cardiovascular: Negative.   Psychiatric/Behavioral: Negative.     Per HPI unless specifically indicated above     Objective:    BP 132/70 mmHg  Pulse 76  Temp(Src) 98.6 F (37 C)  Wt 174 lb 12.8 oz (79.289 kg)  SpO2 98%  Wt Readings from Last 3 Encounters:  05/18/15 174 lb 12.8 oz (79.289 kg)  04/17/15 173 lb 6.4 oz (78.654 kg)  04/13/15 173 lb (78.472 kg)    Physical Exam  Constitutional: She is oriented to person, place, and time. She appears well-developed and well-nourished. No distress.  HENT:  Head: Normocephalic and atraumatic.  Right Ear: Hearing normal.  Left Ear: Hearing normal.  Nose: Nose normal.  Eyes: Conjunctivae and lids are normal. Right eye exhibits no discharge. Left eye exhibits no discharge. No scleral icterus.  Pulmonary/Chest: Effort normal. No respiratory distress.  Musculoskeletal: Normal range of motion.  Neurological: She is alert and oriented to person, place, and time.  Skin: Skin is warm, dry and intact. No rash noted. No erythema. No pallor.  Psychiatric: She has a normal mood and affect. Her speech is normal and behavior is normal. Judgment and thought content normal. Cognition and memory are normal.  Nursing note and vitals reviewed.   Results for orders placed or performed in visit on 10/24/14  Troponin I  Result Value Ref Range   Troponin-I < 0.02 ng/mL  CBC   Result Value Ref Range   WBC 5.6 3.6-11.0 x10 3/mm 3   RBC 4.18 3.80-5.20 X10 6/mm 3   HGB 13.1 12.0-16.0 g/dL   HCT 39.3 35.0-47.0 %   MCV 94 80-100 fL   MCH 31.3 26.0-34.0 pg   MCHC 33.3 32.0-36.0 g/dL   RDW 13.2 11.5-14.5 %   Platelet 211 150-440 x10 3/mm 3  Basic metabolic panel  Result Value Ref Range   Glucose 108 (H) 65-99 mg/dL   BUN 21 (H) 7-18 mg/dL   Creatinine 0.76 0.60-1.30 mg/dL   Sodium 139 136-145 mmol/L   Potassium 3.9 3.5-5.1 mmol/L   Chloride 106 98-107 mmol/L   Co2 27 21-32 mmol/L   Calcium, Total 8.8 8.5-10.1 mg/dL   Osmolality 281 275-301   Anion Gap 6 (L) 7-16   EGFR (African American) >60 >62mL/min   EGFR (Non-African Amer.) >60 >20mL/min      Assessment & Plan:   Problem List Items Addressed This Visit      Other   Caregiver stress - Primary    FMLA filled out for care of her father who has Alzheimer's and stepmother who is going to have open heart surgery. Faxed into her work.           Follow up plan: No Follow-up on file.

## 2015-06-22 ENCOUNTER — Encounter: Payer: Self-pay | Admitting: Family Medicine

## 2015-06-22 ENCOUNTER — Ambulatory Visit (INDEPENDENT_AMBULATORY_CARE_PROVIDER_SITE_OTHER): Payer: BLUE CROSS/BLUE SHIELD | Admitting: Family Medicine

## 2015-06-22 VITALS — BP 140/84 | HR 94 | Temp 97.6°F | Ht 63.5 in | Wt 179.0 lb

## 2015-06-22 DIAGNOSIS — G5602 Carpal tunnel syndrome, left upper limb: Secondary | ICD-10-CM | POA: Diagnosis not present

## 2015-06-22 DIAGNOSIS — G5601 Carpal tunnel syndrome, right upper limb: Secondary | ICD-10-CM

## 2015-06-22 DIAGNOSIS — I10 Essential (primary) hypertension: Secondary | ICD-10-CM

## 2015-06-22 DIAGNOSIS — E038 Other specified hypothyroidism: Secondary | ICD-10-CM | POA: Diagnosis not present

## 2015-06-22 DIAGNOSIS — E039 Hypothyroidism, unspecified: Secondary | ICD-10-CM | POA: Insufficient documentation

## 2015-06-22 DIAGNOSIS — G5603 Carpal tunnel syndrome, bilateral upper limbs: Secondary | ICD-10-CM | POA: Insufficient documentation

## 2015-06-22 NOTE — Assessment & Plan Note (Signed)
Not at goal; continue ARB; limit salt; try DASH guidelines

## 2015-06-22 NOTE — Assessment & Plan Note (Signed)
TSH was normal in January 2016

## 2015-06-22 NOTE — Patient Instructions (Addendum)
We'll refer you to a surgeon Continue the brace Try some of the recommendations below Your thyroid test in January was normal Try to avoid salt and limit sodium in your foods Try Aleve instead of ibuprofen, 220 mg every twelve hours, but take with food; stop if you develop worsening heartburn Try turmeric as a natural anti-inflammatory (for pain and arthritis). It comes in capsules where you buy aspirin and fish oil, but also as a spice where you buy pepper and garlic powder.    Carpal Tunnel Syndrome The carpal tunnel is a narrow area located on the palm side of your wrist. The tunnel is formed by the wrist bones and ligaments. Nerves, blood vessels, and tendons pass through the carpal tunnel. Repeated wrist motion or certain diseases may cause swelling within the tunnel. This swelling pinches the main nerve in the wrist (median nerve) and causes the painful hand and arm condition called carpal tunnel syndrome. CAUSES   Repeated wrist motions.  Wrist injuries.  Certain diseases like arthritis, diabetes, alcoholism, hyperthyroidism, and kidney failure.  Obesity.  Pregnancy. SYMPTOMS   A "pins and needles" feeling in your fingers or hand, especially in your thumb, index and middle fingers.  Tingling or numbness in your fingers or hand.  An aching feeling in your entire arm, especially when your wrist and elbow are bent for long periods of time.  Wrist pain that goes up your arm to your shoulder.  Pain that goes down into your palm or fingers.  A weak feeling in your hands. DIAGNOSIS  Your health care provider will take your history and perform a physical exam. An electromyography test may be needed. This test measures electrical signals sent out by your nerves into the muscles. The electrical signals are usually slowed by carpal tunnel syndrome. You may also need X-rays. TREATMENT  Carpal tunnel syndrome may clear up by itself. Your health care provider may recommend a wrist  splint or medicine such as a nonsteroidal anti-inflammatory medicine. Cortisone injections may help. Sometimes, surgery may be needed to free the pinched nerve.  HOME CARE INSTRUCTIONS   Take all medicine as directed by your health care provider. Only take over-the-counter or prescription medicines for pain, discomfort, or fever as directed by your health care provider.  If you were given a splint to keep your wrist from bending, wear it as directed. It is important to wear the splint at night. Wear the splint for as long as you have pain or numbness in your hand, arm, or wrist. This may take 1 to 2 months.  Rest your wrist from any activity that may be causing your pain. If your symptoms are work-related, you may need to talk to your employer about changing to a job that does not require using your wrist.  Put ice on your wrist after long periods of wrist activity.  Put ice in a plastic bag.  Place a towel between your skin and the bag.  Leave the ice on for 15-20 minutes, 03-04 times a day.  Keep all follow-up visits as directed by your health care provider. This includes any orthopedic referrals, physical therapy, and rehabilitation. Any delay in getting necessary care could result in a delay or failure of your condition to heal. SEEK IMMEDIATE MEDICAL CARE IF:   You have new, unexplained symptoms.  Your symptoms get worse and are not helped or controlled with medicines. MAKE SURE YOU:   Understand these instructions.  Will watch your condition.  Will get help  right away if you are not doing well or get worse. Document Released: 10/03/2000 Document Revised: 02/20/2014 Document Reviewed: 08/22/2011 [facility] Patient Information 2015 [place], [place]. This information is not intended to replace advice given to you by your health care provider. Make sure you discuss any questions you have with your health care provider. Carpal Tunnel Release Carpal tunnel release is done to relieve the  pressure on the nerves and tendons on the bottom side of your wrist.  LET YOUR CAREGIVER KNOW ABOUT:   Allergies to food or medicine.  Medicines taken, including vitamins, herbs, eyedrops, over-the-counter medicines, and creams.  Use of steroids (by mouth or creams).  Previous problems with anesthetics or numbing medicines.  History of bleeding problems or blood clots.  Previous surgery.  Other health problems, including diabetes and kidney problems.  Possibility of pregnancy, if this applies. RISKS AND COMPLICATIONS  Some problems that may happen after this procedure include:  Infection.  Damage to the nerves, arteries or tendons could occur. This would be very uncommon.  Bleeding. BEFORE THE PROCEDURE   This surgery may be done while you are asleep (general anesthetic) or may be done under a block where only your forearm and the surgical area is numb.  If the surgery is done under a block, the numbness will gradually wear off within several hours after surgery. HOME CARE INSTRUCTIONS   Have a responsible person with you for 24 hours.  Do not drive a car or use public transportation for 24 hours.  Only take over-the-counter or prescription medicines for pain, discomfort, or fever as directed by your caregiver. Take them as directed.  You may put ice on the palm side of the affected wrist.  Put ice in a plastic bag.  Place a towel between your skin and the bag.  Leave the ice on for 20 to 30 minutes, 4 times per day.  If you were given a splint to keep your wrist from bending, use it as directed. It is important to wear the splint at night or as directed. Use the splint for as long as you have pain or numbness in your hand, arm, or wrist. This may take 1 to 2 months.  Keep your hand raised (elevated) above the level of your heart as much as possible. This keeps swelling down and helps with discomfort.  Change bandages (dressings) as directed.  Keep the wound clean  and dry. SEEK MEDICAL CARE IF:   You develop pain not relieved with medications.  You develop numbness of your hand.  You develop bleeding from your surgical site.  You have an oral temperature above 102 F (38.9 C).  You develop redness or swelling of the surgical site.  You develop new, unexplained problems. SEEK IMMEDIATE MEDICAL CARE IF:   You develop a rash.  You have difficulty breathing.  You develop any reaction or side effects to medications given. Document Released: 12/27/2003 Document Revised: 12/29/2011 Document Reviewed: 08/12/2007 [facility] Patient Information 2015 [place], [place]. This information is not intended to replace advice given to you by your health care provider. Make sure you discuss any questions you have with your health care provider. DASH Eating Plan DASH stands for "Dietary Approaches to Stop Hypertension." The DASH eating plan is a healthy eating plan that has been shown to reduce high blood pressure (hypertension). Additional health benefits may include reducing the risk of type 2 diabetes mellitus, heart disease, and stroke. The DASH eating plan may also help with weight loss. WHAT  DO I NEED TO KNOW ABOUT THE DASH EATING PLAN? For the DASH eating plan, you will follow these general guidelines:  Choose foods with a percent daily value for sodium of less than 5% (as listed on the food label).  Use salt-free seasonings or herbs instead of table salt or sea salt.  Check with your health care provider or pharmacist before using salt substitutes.  Eat lower-sodium products, often labeled as "lower sodium" or "no salt added."  Eat fresh foods.  Eat more vegetables, fruits, and low-fat dairy products.  Choose whole grains. Look for the word "whole" as the first word in the ingredient list.  Choose fish and skinless chicken or [place] more often than red meat. Limit fish, poultry, and meat to 6 oz (170 g) each day.  Limit sweets, desserts, sugars,  and sugary drinks.  Choose heart-healthy fats.  Limit cheese to 1 oz (28 g) per day.  Eat more home-cooked food and less restaurant, buffet, and fast food.  Limit fried foods.  Cook foods using methods other than frying.  Limit canned vegetables. If you do use them, rinse them well to decrease the sodium.  When eating at a restaurant, ask that your food be prepared with less salt, or no salt if possible. WHAT FOODS CAN I EAT? Seek help from a dietitian for individual calorie needs. Grains Whole grain or whole wheat bread. Brown rice. Whole grain or whole wheat pasta. Quinoa, bulgur, and whole grain cereals. Low-sodium cereals. Corn or whole wheat flour tortillas. Whole grain cornbread. Whole grain crackers. Low-sodium crackers. Vegetables Fresh or frozen vegetables (raw, steamed, roasted, or grilled). Low-sodium or reduced-sodium tomato and vegetable juices. Low-sodium or reduced-sodium tomato sauce and paste. Low-sodium or reduced-sodium canned vegetables.  Fruits All fresh, canned (in natural juice), or frozen fruits. Meat and Other Protein Products Ground beef (85% or leaner), grass-fed beef, or beef trimmed of fat. Skinless chicken or [place]. Ground chicken or [place]. Pork trimmed of fat. All fish and seafood. Eggs. Dried beans, peas, or lentils. Unsalted nuts and seeds. Unsalted canned beans. Dairy Low-fat dairy products, such as skim or 1% milk, 2% or reduced-fat cheeses, low-fat ricotta or cottage cheese, or plain low-fat yogurt. Low-sodium or reduced-sodium cheeses. Fats and Oils Tub margarines without trans fats. Light or reduced-fat mayonnaise and salad dressings (reduced sodium). Avocado. Safflower, olive, or canola oils. Natural peanut or almond butter. Other Unsalted popcorn and pretzels. The items listed above may not be a complete list of recommended foods or beverages. Contact your dietitian for more options. WHAT FOODS ARE NOT RECOMMENDED? Grains White bread. White  pasta. White rice. Refined cornbread. Bagels and croissants. Crackers that contain trans fat. Vegetables Creamed or fried vegetables. Vegetables in a cheese sauce. Regular canned vegetables. Regular canned tomato sauce and paste. Regular tomato and vegetable juices. Fruits Dried fruits. Canned fruit in light or heavy syrup. Fruit juice. Meat and Other Protein Products Fatty cuts of meat. Ribs, chicken wings, bacon, sausage, bologna, salami, chitterlings, fatback, hot dogs, bratwurst, and packaged luncheon meats. Salted nuts and seeds. Canned beans with salt. Dairy Whole or 2% milk, cream, half-and-half, and cream cheese. Whole-fat or sweetened yogurt. Full-fat cheeses or blue cheese. Nondairy creamers and whipped toppings. Processed cheese, cheese spreads, or cheese curds. Condiments Onion and garlic salt, seasoned salt, table salt, and sea salt. Canned and packaged gravies. Worcestershire sauce. Tartar sauce. Barbecue sauce. Teriyaki sauce. Soy sauce, including reduced sodium. Steak sauce. Fish sauce. Oyster sauce. Cocktail sauce. Horseradish. Ketchup and mustard.  Meat flavorings and tenderizers. Bouillon cubes. Hot sauce. Tabasco sauce. Marinades. Taco seasonings. Relishes. Fats and Oils Butter, stick margarine, lard, shortening, ghee, and bacon fat. Coconut, palm kernel, or palm oils. Regular salad dressings. Other Pickles and olives. Salted popcorn and pretzels. The items listed above may not be a complete list of foods and beverages to avoid. Contact your dietitian for more information. WHERE CAN I FIND MORE INFORMATION? National Heart, Lung, and Blood Institute: [employer] Document Released: 09/25/2011 Document Revised: 02/20/2014 Document Reviewed: 08/10/2013 [facility] Patient Information 2015 [place], [place]. This information is not intended to replace advice given to you by your health care provider. Make sure you discuss any questions you have with  your health care provider.

## 2015-06-22 NOTE — Progress Notes (Signed)
BP 140/84 mmHg  Pulse 94  Temp(Src) 97.6 F (36.4 C)  Ht 5' 3.5" (1.613 m)  Wt 179 lb (81.194 kg)  BMI 31.21 kg/m2  SpO2 96%   Subjective:    Patient ID: Emily [name], female    DOB: 1960-10-21, 54 y.o.   MRN: [id number]  HPI: Emily [name] is a 54 y.o. female  Chief Complaint  Patient presents with  . Hand Pain    Bilateral hand pain and numbness. She thinks she needs surgery on them. 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   She has had numbness in her hands, goes all the way She has seen Dr. 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 Interim medical history since our last visit reviewed. Allergies and medications reviewed and updated.  Review of Systems Per HPI unless specifically indicated above     Objective:    BP 140/84 mmHg  Pulse 94  Temp(Src) 97.6 F (36.4 C)  Ht 5' 3.5" (1.613 m)  Wt 179 lb (81.194 kg)  BMI 31.21 kg/m2  SpO2 96%  Wt Readings from Last 3 Encounters:  06/22/15 179 lb (81.194 kg)  05/18/15 174 lb 12.8 oz (79.289 kg)  04/17/15 173 lb 6.4 oz (78.654 kg)    Physical Exam  Constitutional: She appears well-developed and well-nourished.  HENT:  Mouth/Throat: Mucous membranes are  normal.  Eyes: EOM are normal. No scleral icterus.  Cardiovascular: Normal rate and regular rhythm.   Pulmonary/Chest: Effort normal and breath sounds normal.  Musculoskeletal:       Right hand: She exhibits no tenderness, no bony tenderness, no deformity and no swelling. Decreased sensation noted. Decreased sensation is present in the medial distribution. Decreased strength noted. She exhibits thumb/finger opposition.       Left hand: She exhibits no tenderness, no bony tenderness, no deformity and no swelling. Decreased sensation noted. Decreased sensation is present in the medial distribution. Decreased strength noted. She exhibits thumb/finger opposition.  Positive Phalen's test bilaterally within 20 seconds  Skin: Skin is warm and dry. She is not diaphoretic. No pallor.  Psychiatric: She has a normal mood and affect. Her behavior is normal.  Quiet but good eye contact with examiner      Assessment & Plan:   Problem List Items Addressed This Visit      Cardiovascular and Mediastinum   Hypertension    Not at goal; continue ARB; limit salt; try DASH guidelines        Endocrine   Other specified hypothyroidism    TSH was normal in January 2016        Nervous and Auditory   Carpal tunnel syndrome, bilateral -  Primary    Refer to orthopaedist / hand specialist for evaluation and consideration of surgery; continue the bracing; avoid repetitive motions with the hands and wrists         Follow up plan: Return if symptoms worsen or fail to improve. An after-visit summary was printed and given to the patient at check-out.  Please see the patient instructions which may contain other information and recommendations beyond what is mentioned above in the assessment and plan.

## 2015-06-22 NOTE — Assessment & Plan Note (Signed)
Refer to orthopaedist / hand specialist for evaluation and consideration of surgery; continue the bracing; avoid repetitive motions with the hands and wrists

## 2015-06-26 ENCOUNTER — Telehealth: Payer: Self-pay | Admitting: Family Medicine

## 2015-06-26 NOTE — Telephone Encounter (Signed)
Dr. Sherie Don, how long do you want her work note to be for?

## 2015-06-26 NOTE — Telephone Encounter (Signed)
Pt called stated she has not been contacted about her referral for her hands. Pt stated she will need a doctors note as she can not work. Pt stated she will also need Dr. Sherie Don to complete FMLA paperwork, she will drop this off if that's ok. Please call pt when the work excuse is ready for pick up. Thanks.

## 2015-06-26 NOTE — Telephone Encounter (Signed)
Please send note to Tiffany next to work on ortho referral Okay to be out, FL[employer] paperwork for carpal tunnel

## 2015-06-26 NOTE — Telephone Encounter (Signed)
I'll write her out until she sees the orthopaedist and then they will take this over; approve absence until the day she sees him/her (If her appt is Sept 13th for example, write her out through Sept 14th and they will extend it however far they believe it is needed) She should be seeing someone pretty soon, within the next week or so

## 2015-06-26 NOTE — Telephone Encounter (Signed)
Dr. Sherie Don, she needs a work note. Tiffany, will you check on her referral?

## 2015-06-29 NOTE — Telephone Encounter (Signed)
Emily [name] from [facility] called inquiring about a surgical clearance for the patient. There are no slots open next week for an appointment. Pt's surgery is next Tuesday (07/03/15). Please call Cordelia Pen or Tiffany @ [facility] ASAP.

## 2015-06-29 NOTE — Telephone Encounter (Signed)
Tiffany Foxx faxed clearance to [facility] 06/29/15. Thanks.

## 2015-07-02 ENCOUNTER — Other Ambulatory Visit: Payer: Self-pay

## 2015-07-02 ENCOUNTER — Encounter: Payer: Self-pay | Admitting: *Deleted

## 2015-07-02 NOTE — Patient Instructions (Signed)
  Your procedure is scheduled on: 07-03-15 Report to [facility] 2ND FLOOR. To find out your arrival time please call [phone] between 1PM - 3PM on 07-02-15  Remember: Instructions that are not followed completely may result in serious medical risk, up to and including death, or upon the discretion of your surgeon and anesthesiologist your surgery may need to be rescheduled.    _X___ 1. Do not eat food or drink liquids after midnight. No gum chewing or hard candies.     _X___ 2. No Alcohol for 24 hours before or after surgery.   ____ 3. Bring all medications with you on the day of surgery if instructed.    ____ 4. Notify your doctor if there is any change in your medical condition     (cold, fever, infections).     Do not wear jewelry, make-up, hairpins, clips or nail polish.  Do not wear lotions, powders, or perfumes. You may wear deodorant.  Do not shave 48 hours prior to surgery. Men may shave face and neck.  Do not bring valuables to the hospital.    [facility] is not responsible for any belongings or valuables.               Contacts, dentures or bridgework may not be worn into surgery.  Leave your suitcase in the car. After surgery it may be brought to your room.  For patients admitted to the hospital, discharge time is determined by your treatment team.   Patients discharged the day of surgery will not be allowed to drive home.   Please read over the following fact sheets that you were given:      _X___ Take these medicines the morning of surgery with A SIP OF WATER:    1. SYNTHROID  2. EFFEXOR  3. LOSARTAN  4. PRILOSEC  5. TAKE A PRILOSEC Monday NIGHT   6.  ____ Fleet Enema (as directed)   ____ Use CHG Soap as directed  ____ Use inhalers on the day of surgery  ____ Stop metformin 2 days prior to surgery    ____ Take 1/2 of usual insulin dose the night before surgery and none on the morning of surgery.   ____ Stop  Coumadin/Plavix/aspirin-N/A  __X_ Stop Anti-inflammatories-STOP ALEVE NOW-NO NSAIDS OR ASA PRODUCTS-TYLENOL OK   ___ Stop supplements until after surgery-PT ALREADY TOOK FISH OIL TODAY(07-02-15)  ____ Bring C-Pap to the hospital.

## 2015-07-03 ENCOUNTER — Encounter: Payer: Self-pay | Admitting: *Deleted

## 2015-07-03 ENCOUNTER — Ambulatory Visit: Payer: BLUE CROSS/BLUE SHIELD | Admitting: Certified Registered"

## 2015-07-03 ENCOUNTER — Encounter
Admission: RE | Disposition: A | Discharge status: Home / Self Care | Payer: Self-pay | Source: Ambulatory Visit | Attending: Specialist

## 2015-07-03 ENCOUNTER — Ambulatory Visit
Admission: RE | Admit: 2015-07-03 | Discharge: 2015-07-03 | Disposition: A | Discharge status: Home / Self Care | Payer: BLUE CROSS/BLUE SHIELD | Source: Ambulatory Visit | Attending: Specialist | Admitting: Specialist

## 2015-07-03 DIAGNOSIS — R0602 Shortness of breath: Secondary | ICD-10-CM | POA: Insufficient documentation

## 2015-07-03 DIAGNOSIS — E039 Hypothyroidism, unspecified: Secondary | ICD-10-CM | POA: Diagnosis not present

## 2015-07-03 DIAGNOSIS — I10 Essential (primary) hypertension: Secondary | ICD-10-CM | POA: Insufficient documentation

## 2015-07-03 DIAGNOSIS — Z87891 Personal history of nicotine dependence: Secondary | ICD-10-CM | POA: Insufficient documentation

## 2015-07-03 DIAGNOSIS — G5602 Carpal tunnel syndrome, left upper limb: Secondary | ICD-10-CM | POA: Insufficient documentation

## 2015-07-03 DIAGNOSIS — K219 Gastro-esophageal reflux disease without esophagitis: Secondary | ICD-10-CM | POA: Insufficient documentation

## 2015-07-03 DIAGNOSIS — G709 Myoneural disorder, unspecified: Secondary | ICD-10-CM | POA: Insufficient documentation

## 2015-07-03 HISTORY — PX: CARPAL TUNNEL RELEASE: SHX101

## 2015-07-03 HISTORY — DX: Hypothyroidism, unspecified: E03.9

## 2015-07-03 HISTORY — DX: Gastro-esophageal reflux disease without esophagitis: K21.9

## 2015-07-03 SURGERY — CARPAL TUNNEL RELEASE
Anesthesia: General | Laterality: Left

## 2015-07-03 MED ORDER — ONDANSETRON HCL 4 MG/2ML IJ SOLN
INTRAMUSCULAR | Status: DC | PRN
  Administered 2015-07-03: 4 mg via INTRAVENOUS

## 2015-07-03 MED ORDER — KETAMINE HCL 50 MG/ML IJ SOLN
INTRAMUSCULAR | Status: DC | PRN
  Administered 2015-07-03: 25 mg via INTRAVENOUS

## 2015-07-03 MED ORDER — BUPIVACAINE HCL (PF) 0.5 % IJ SOLN
INTRAMUSCULAR | Status: AC
  Filled 2015-07-03: qty 30

## 2015-07-03 MED ORDER — HYDROCODONE-ACETAMINOPHEN 5-325 MG PO TABS: 1.0000 | ORAL_TABLET | Freq: Four times a day (QID) | ORAL | Status: DC | PRN

## 2015-07-03 MED ORDER — PROPOFOL 10 MG/ML IV BOLUS
INTRAVENOUS | Status: DC | PRN
  Administered 2015-07-03: 150 mg via INTRAVENOUS

## 2015-07-03 MED ORDER — BUPIVACAINE HCL 0.5 % IJ SOLN
INTRAMUSCULAR | Status: DC | PRN
  Administered 2015-07-03: 6 mL

## 2015-07-03 MED ORDER — HYDROCODONE-ACETAMINOPHEN 5-325 MG PO TABS
ORAL_TABLET | ORAL | Status: AC
  Filled 2015-07-03: qty 1

## 2015-07-03 MED ORDER — MIDAZOLAM HCL 2 MG/2ML IJ SOLN
INTRAMUSCULAR | Status: DC | PRN
  Administered 2015-07-03: 2 mg via INTRAVENOUS

## 2015-07-03 MED ORDER — ONDANSETRON HCL 4 MG/2ML IJ SOLN: 4.0000 mg | Freq: Once | INTRAMUSCULAR | Status: DC | PRN

## 2015-07-03 MED ORDER — LACTATED RINGERS IV SOLN
INTRAVENOUS | Status: DC
  Administered 2015-07-03: 07:00:00 via INTRAVENOUS

## 2015-07-03 MED ORDER — FENTANYL CITRATE (PF) 100 MCG/2ML IJ SOLN
INTRAMUSCULAR | Status: DC | PRN
  Administered 2015-07-03 (×2): 50 ug via INTRAVENOUS

## 2015-07-03 MED ORDER — LIDOCAINE HCL (CARDIAC) 20 MG/ML IV SOLN
INTRAVENOUS | Status: DC | PRN
  Administered 2015-07-03: 50 mg via INTRAVENOUS

## 2015-07-03 MED ORDER — DEXAMETHASONE SODIUM PHOSPHATE 4 MG/ML IJ SOLN
INTRAMUSCULAR | Status: DC | PRN
  Administered 2015-07-03: 5 mg via INTRAVENOUS

## 2015-07-03 MED ORDER — FENTANYL CITRATE (PF) 100 MCG/2ML IJ SOLN: 25.0000 ug | INTRAMUSCULAR | Status: DC | PRN

## 2015-07-03 MED ORDER — HYDROCODONE-ACETAMINOPHEN 5-325 MG PO TABS
1.0000 | ORAL_TABLET | Freq: Four times a day (QID) | ORAL | Status: DC | PRN
  Administered 2015-07-03: 1 via ORAL

## 2015-07-03 MED ORDER — GLYCOPYRROLATE 0.2 MG/ML IJ SOLN
INTRAMUSCULAR | Status: DC | PRN
  Administered 2015-07-03: 0.2 mg via INTRAVENOUS

## 2015-07-03 SURGICAL SUPPLY — 24 items
BANDAGE ELASTIC 3 CLIP NS LF (GAUZE/BANDAGES/DRESSINGS) ×3 IMPLANT
BNDG ESMARK 4X12 TAN STRL LF (GAUZE/BANDAGES/DRESSINGS) ×3 IMPLANT
CANISTER SUCT 1200ML W/VALVE (MISCELLANEOUS) ×3 IMPLANT
CAST PADDING 3X4FT ST 30246 (SOFTGOODS) ×4
CHLORAPREP W/TINT 26ML (MISCELLANEOUS) ×3 IMPLANT
GAUZE PETRO XEROFOAM 1X8 (MISCELLANEOUS) ×3 IMPLANT
GAUZE SPONGE 4X4 12PLY STRL (GAUZE/BANDAGES/DRESSINGS) ×3 IMPLANT
GLOVE BIO SURGEON STRL SZ7.5 (GLOVE) ×3 IMPLANT
GOWN STRL REUS W/ TWL LRG LVL3 (GOWN DISPOSABLE) ×2 IMPLANT
GOWN STRL REUS W/TWL LRG LVL3 (GOWN DISPOSABLE) ×6
KIT RM TURNOVER STRD PROC AR (KITS) ×3 IMPLANT
NS IRRIG 500ML POUR BTL (IV SOLUTION) ×3 IMPLANT
PACK EXTREMITY ARMC (MISCELLANEOUS) ×3 IMPLANT
PAD CAST CTTN 3X4 STRL (SOFTGOODS) ×2 IMPLANT
PAD GROUND ADULT SPLIT (MISCELLANEOUS) ×3 IMPLANT
PAD PREP 24X41 OB/GYN DISP (PERSONAL CARE ITEMS) ×3 IMPLANT
PADDING CAST 3IN STRL (MISCELLANEOUS) ×2
PADDING CAST BLEND 3X4 STRL (MISCELLANEOUS) ×1 IMPLANT
PADDING CAST COTTON 3X4 STRL (SOFTGOODS) ×2
STOCKINETTE 48X4 2 PLY STRL (GAUZE/BANDAGES/DRESSINGS) ×1 IMPLANT
STOCKINETTE STRL 4IN 9604848 (GAUZE/BANDAGES/DRESSINGS) ×3 IMPLANT
SUT ETHILON 4-0 (SUTURE) ×3
SUT ETHILON 4-0 FS2 18XMFL BLK (SUTURE) ×1
SUTURE ETHLN 4-0 FS2 18XMF BLK (SUTURE) ×1 IMPLANT

## 2015-07-03 NOTE — H&P (Signed)
  54 year old with bilateral carpal tunnel syndrome.  History and physical exam has been inserted into the chart in the form of a paper document.  Heart and lungs are clear.  ENT normal.  Plan: carpal tunnel release today via SDS.

## 2015-07-03 NOTE — Anesthesia Preprocedure Evaluation (Signed)
Anesthesia Evaluation  Patient identified by MRN, date of birth, ID band Patient awake    Reviewed: Allergy & Precautions, H&P , NPO status , Patient's Chart, lab work & pertinent test results, reviewed documented beta blocker date and time   Airway Mallampati: II  TM Distance: >3 FB Neck ROM: full    Dental   Pulmonary neg pulmonary ROS, neg shortness of breath, former smoker,           Cardiovascular Exercise Tolerance: Good hypertension, Normal cardiovascular exam Rate:Normal     Neuro/Psych PSYCHIATRIC DISORDERS  Neuromuscular disease    GI/Hepatic Neg liver ROS, GERD  Controlled,  Endo/Other  Hypothyroidism   Renal/GU negative Renal ROS  negative genitourinary   Musculoskeletal   Abdominal   Peds  Hematology negative hematology ROS (+)   Anesthesia Other Findings   Reproductive/Obstetrics negative OB ROS                             Anesthesia Physical Anesthesia Plan  ASA: II  Anesthesia Plan: General LMA   Post-op Pain Management:    Induction:   Airway Management Planned:   Additional Equipment:   Intra-op Plan:   Post-operative Plan:   Informed Consent: I have reviewed the patients History and Physical, chart, labs and discussed the procedure including the risks, benefits and alternatives for the proposed anesthesia with the patient or authorized representative who has indicated his/her understanding and acceptance.     Plan Discussed with: CRNA  Anesthesia Plan Comments:         Anesthesia Quick Evaluation

## 2015-07-03 NOTE — Progress Notes (Signed)
   07/03/15 0930  Clinical Encounter Type  Visited With Patient and family together  Visit Type Initial  Provided pastoral presence and support to patient and her husband in post op same day surgery area.  [employer] Kyannah Climer-pager [phone]

## 2015-07-03 NOTE — Discharge Instructions (Signed)
Elevate arm as much as possible May remove entire bandage in 24 hours, bathe, get wet, etc. Cover wound with Bandaid Wear brace as necessary for comfort after bandage is changed

## 2015-07-03 NOTE — Transfer of Care (Signed)
Immediate Anesthesia Transfer of Care Note  Patient: Emily [name]  Procedure(s) Performed: Procedure(s): CARPAL TUNNEL RELEASE (Left)  Patient Location: PACU  Anesthesia Type:General  Level of Consciousness: awake  Airway & Oxygen Therapy: Patient Spontanous Breathing and Patient connected to face mask oxygen  Post-op Assessment: Report given to RN  Post vital signs: Reviewed  Last Vitals:  Filed Vitals:   07/03/15 0811  BP: 167/72  Pulse: 66  Temp: 36.1 C  Resp: 14    Complications: No apparent anesthesia complications

## 2015-07-03 NOTE — Brief Op Note (Signed)
07/03/2015  8:16 AM  PATIENT:  Emily [name]  54 y.o. female  PRE-OPERATIVE DIAGNOSIS:  carpal tunnel syndrome  POST-OPERATIVE DIAGNOSIS:  carpal tunnel syndrome  PROCEDURE:  Procedure(s): CARPAL TUNNEL RELEASE (Left)  SURGEON:  Surgeon(s) and Role:    * Myra Rude, MD - Primary  PHYSICIAN ASSISTANT:   ASSISTANTS: none   ANESTHESIA:   general  EBL:  Total I/O In: 400 [I.V.:400] Out: -   BLOOD ADMINISTERED:none  DRAINS: none   LOCAL MEDICATIONS USED:  MARCAINE     SPECIMEN:  No Specimen  DISPOSITION OF SPECIMEN:  N/A  COUNTS:  YES  TOURNIQUET:   Total Tourniquet Time Documented: Upper Arm (Left) - 16 minutes Total: Upper Arm (Left) - 16 minutes   DICTATION: .Other Dictation: Dictation Number [id number]  PLAN OF CARE: Discharge to home after PACU  PATIENT DISPOSITION:  PACU - hemodynamically stable.   Delay start of Pharmacological VTE agent (>24hrs) due to surgical blood loss or risk of bleeding: not applicable

## 2015-07-03 NOTE — Anesthesia Procedure Notes (Signed)
Procedure Name: LMA Insertion Performed by: Everhett Bozard Pre-anesthesia Checklist: Patient identified, Patient being monitored, Timeout performed, Emergency Drugs available and Suction available Patient Re-evaluated:Patient Re-evaluated prior to inductionOxygen Delivery Method: Circle system utilized Preoxygenation: Pre-oxygenation with 100% oxygen Intubation Type: IV induction LMA: LMA inserted LMA Size: 3.0 Tube type: Oral Number of attempts: 1 Placement Confirmation: positive ETCO2 and breath sounds checked- equal and bilateral Tube secured with: Tape Dental Injury: Teeth and Oropharynx as per pre-operative assessment        

## 2015-07-04 NOTE — Op Note (Signed)
NAMEVYLET, MAFFIA NO.:  [id number]  MEDICAL RECORD NO.:  [id number]  LOCATION:  [facility]                         FACILITY:  [facility]  PHYSICIAN:  Reita Chard, MD        DATE OF BIRTH:  November 02, 1960  DATE OF PROCEDURE:  07/03/2015 DATE OF DISCHARGE:  07/03/2015                              OPERATIVE REPORT   PREOPERATIVE DIAGNOSIS:  Left carpal tunnel syndrome.  POSTOPERATIVE DIAGNOSIS:  Left carpal tunnel syndrome.  PROCEDURE:  Left carpal tunnel release.  SURGEON:  Reita Chard, M.D.  ANESTHESIA:  General.  COMPLICATIONS:  None.  TOURNIQUET TIME:  Approximately 15 minutes.  DESCRIPTION OF PROCEDURE:  After adequate induction of general anesthesia, the left upper extremity was thoroughly prepped with alcohol and ChloraPrep and draped in standard sterile fashion.  The extremity was wrapped out with the Esmarch bandage and pneumatic tourniquet elevated to 250 mmHg.  Under loupe magnification, standard volar carpal tunnel incision was made.  The dissection was carried down to the transverse retinacular ligament.  The midportion was incised with a knife.  The distal release was performed with the small scissors and the proximal release was performed with the small scissors and the carpal tunnel scissors.  Careful check was made both proximally and distally to ensure that complete release had been obtained.  The wound was thoroughly irrigated multiple times.  Skin edges were closed with 4-0 nylon.  Skin edges were infiltrated with 0.5% plain Marcaine.  A soft bulky dressing was applied.  Tourniquet was released.  The patient returned to the recovery room in satisfactory condition, having tolerated the procedure quite well.          ______________________________ Reita Chard, MD     CS/MEDQ  D:  07/03/2015  T:  07/04/2015  Job:  [id number]

## 2015-07-06 NOTE — Anesthesia Postprocedure Evaluation (Signed)
  Anesthesia Post-op Note  Patient: Emily [name]  Procedure(s) Performed: Procedure(s): CARPAL TUNNEL RELEASE (Left)  Anesthesia type:General LMA  Patient location: PACU  Post pain: Pain level controlled  Post assessment: Post-op Vital signs reviewed, Patient's Cardiovascular Status Stable, Respiratory Function Stable, Patent Airway and No signs of Nausea or vomiting  Post vital signs: Reviewed and stable  Last Vitals:  Filed Vitals:   07/03/15 0945  BP: 116/74  Pulse: 67  Temp: 36.3 C  Resp: 16    Level of consciousness: awake, alert  and patient cooperative  Complications: No apparent anesthesia complications

## 2015-08-21 ENCOUNTER — Telehealth: Payer: Self-pay

## 2015-08-21 NOTE — Telephone Encounter (Signed)
We got new paperwork for surgical clearance for her right carpal tunnel surgery. We just did surgical clearance for her left side back in Sept. Does she need to schedule another appt or can you fill out her form?

## 2015-08-21 NOTE — Telephone Encounter (Signed)
Brief visit please; it's been about two months since she was last seen Ask her who she wants to see; Dr. Laural BenesJohnson is listed as her primary; I think I saw her as an acute because Dr. Laural BenesJohnson was gone

## 2015-08-21 NOTE — Telephone Encounter (Signed)
11/3/ appointment with Dr. Laural BenesJohnson

## 2015-08-23 ENCOUNTER — Ambulatory Visit: Payer: BLUE CROSS/BLUE SHIELD | Admitting: Family Medicine

## 2015-08-27 ENCOUNTER — Ambulatory Visit (INDEPENDENT_AMBULATORY_CARE_PROVIDER_SITE_OTHER): Payer: BLUE CROSS/BLUE SHIELD | Admitting: Family Medicine

## 2015-08-27 ENCOUNTER — Encounter: Payer: Self-pay | Admitting: Family Medicine

## 2015-08-27 VITALS — BP 132/76 | HR 78 | Temp 98.2°F | Ht 63.2 in | Wt 192.0 lb

## 2015-08-27 DIAGNOSIS — Z23 Encounter for immunization: Secondary | ICD-10-CM | POA: Diagnosis not present

## 2015-08-27 DIAGNOSIS — Z01818 Encounter for other preprocedural examination: Secondary | ICD-10-CM

## 2015-08-27 DIAGNOSIS — Z1239 Encounter for other screening for malignant neoplasm of breast: Secondary | ICD-10-CM | POA: Diagnosis not present

## 2015-08-27 NOTE — Patient Instructions (Signed)
Stop the meloxicam, naproxen and fish oil Friday Nov 11 Should be good for surgery on the 18th

## 2015-08-27 NOTE — Progress Notes (Signed)
BP 132/76 mmHg  Pulse 78  Temp(Src) 98.2 F (36.8 C)  Ht 5' 3.2" (1.605 m)  Wt 192 lb (87.091 kg)  BMI 33.81 kg/m2  SpO2 98%   Subjective:    Patient ID: Emily [name], female    DOB: [birth year], 54 y.o.   MRN: [id number]  HPI: [name] is a 54 y.o. female  Chief Complaint  Patient presents with  . surgical clearance   To have CTS done on next Friday Had her 1st one done on Sept 13 and did very well with no complications.  No problems with anesthesia at that time.  Never had problems with anesthesia.  Has been feeling well. No CP, SOB or other issues. She has been pretty stressed as she has been taking care of her Dad while her Mom had open heart surgery and he has Alzheimer's disease. She is otherwise feeling well with no other concerns or complaints at this time.  She is taking fish oil and NSAIDs which will need to be stopped prior to surgery. Will need to continue BP, depression and thyroid medicine.   Relevant past medical, surgical, family and social history reviewed and updated as indicated. Interim medical history since our last visit reviewed. Allergies and medications reviewed and updated.  Review of Systems  Constitutional: Negative.   HENT: Negative.   Respiratory: Negative.   Cardiovascular: Negative.   Gastrointestinal: Negative.   Endocrine: Negative.   Neurological: Negative.   Psychiatric/Behavioral: Negative.     Per HPI unless specifically indicated above     Objective:    BP 132/76 mmHg  Pulse 78  Temp(Src) 98.2 F (36.8 C)  Ht 5' 3.2" (1.605 m)  Wt 192 lb (87.091 kg)  BMI 33.81 kg/m2  SpO2 98%  Wt Readings from Last 3 Encounters:  08/27/15 192 lb (87.091 kg)  07/02/15 175 lb (79.379 kg)  06/22/15 179 lb (81.194 kg)    Physical Exam  Constitutional: She is oriented to person, place, and time. She appears well-developed and well-nourished. No distress.  HENT:  Head: Normocephalic and atraumatic.  Right Ear: Hearing normal.  Left  Ear: Hearing normal.  Nose: Nose normal.  Mouth/Throat: Oropharynx is clear and moist. No oropharyngeal exudate.  Eyes: Conjunctivae and lids are normal. Pupils are equal, round, and reactive to light. Right eye exhibits no discharge. Left eye exhibits no discharge. No scleral icterus.  Neck: Normal range of motion. Neck supple. No JVD present. No tracheal deviation present. No thyromegaly present.  Cardiovascular: Normal rate, regular rhythm, normal heart sounds and intact distal pulses.  Exam reveals no gallop and no friction rub.   No murmur heard. Pulmonary/Chest: Effort normal and breath sounds normal. No stridor. No respiratory distress. She has no wheezes. She has no rales. She exhibits no tenderness.  Abdominal: Soft. Bowel sounds are normal. She exhibits no distension and no mass. There is no tenderness. There is no rebound and no guarding.  Musculoskeletal: Normal range of motion.  Lymphadenopathy:    She has no cervical adenopathy.  Neurological: She is alert and oriented to person, place, and time.  Skin: Skin is warm, dry and intact. No rash noted. No erythema. No pallor.  Psychiatric: She has a normal mood and affect. Her speech is normal and behavior is normal. Judgment and thought content normal. Cognition and memory are normal.  Nursing note and vitals reviewed.   Results for orders placed or performed in visit on 10/24/14  Troponin I  Result Value Ref Range  Troponin-I < 0.02 ng/mL  CBC  Result Value Ref Range   WBC 5.6 3.6-11.0 x10 3/mm 3   RBC 4.18 3.80-5.20 X10 6/mm 3   HGB 13.1 12.0-16.0 g/dL   HCT 39.3 35.0-47.0 %   MCV 94 80-100 fL   MCH 31.3 26.0-34.0 pg   MCHC 33.3 32.0-36.0 g/dL   RDW 13.2 11.5-14.5 %   Platelet 211 150-440 x10 3/mm 3  Basic metabolic panel  Result Value Ref Range   Glucose 108 (H) 65-99 mg/dL   BUN 21 (H) 7-18 mg/dL   Creatinine 0.76 0.60-1.30 mg/dL   Sodium 139 136-145 mmol/L   Potassium 3.9 3.5-5.1 mmol/L   Chloride 106 98-107  mmol/L   Co2 27 21-32 mmol/L   Calcium, Total 8.8 8.5-10.1 mg/dL   Osmolality 281 275-301   Anion Gap 6 (L) 7-16   EGFR (African American) >60 >63m/min   EGFR (Non-African Amer.) >60 >625mmin      Assessment & Plan:   Problem List Items Addressed This Visit    None    Visit Diagnoses    Preop examination    -  Primary    Never had a problem with anesthesia. BP under good control. Lungs clear. Stop NSAIDs and fish oil 1 week out. Cleared for surgery. Form filled out.     Screening for breast cancer        Mammogram ordered today.    Relevant Orders    MM Digital Screening    Immunization due        Flu shot given today.    Relevant Orders    Flu Vaccine QUAD 36+ mos PF IM (Fluarix & Fluzone Quad PF) (Completed)        Follow up plan: Return Early Feb, late Jan (1/27 or later), for Physical.

## 2015-09-07 ENCOUNTER — Encounter
Admission: RE | Disposition: A | Discharge status: Home / Self Care | Payer: Self-pay | Source: Ambulatory Visit | Attending: Specialist

## 2015-09-07 ENCOUNTER — Ambulatory Visit: Payer: BLUE CROSS/BLUE SHIELD | Admitting: Anesthesiology

## 2015-09-07 ENCOUNTER — Encounter: Payer: Self-pay | Admitting: *Deleted

## 2015-09-07 ENCOUNTER — Ambulatory Visit: Payer: Self-pay | Admitting: Specialist

## 2015-09-07 ENCOUNTER — Ambulatory Visit
Admission: RE | Admit: 2015-09-07 | Discharge: 2015-09-07 | Disposition: A | Discharge status: Home / Self Care | Payer: BLUE CROSS/BLUE SHIELD | Source: Ambulatory Visit | Attending: Specialist | Admitting: Specialist

## 2015-09-07 DIAGNOSIS — R0602 Shortness of breath: Secondary | ICD-10-CM | POA: Diagnosis not present

## 2015-09-07 DIAGNOSIS — I10 Essential (primary) hypertension: Secondary | ICD-10-CM | POA: Insufficient documentation

## 2015-09-07 DIAGNOSIS — M1990 Unspecified osteoarthritis, unspecified site: Secondary | ICD-10-CM | POA: Diagnosis not present

## 2015-09-07 DIAGNOSIS — E039 Hypothyroidism, unspecified: Secondary | ICD-10-CM | POA: Diagnosis not present

## 2015-09-07 DIAGNOSIS — Z79899 Other long term (current) drug therapy: Secondary | ICD-10-CM | POA: Diagnosis not present

## 2015-09-07 DIAGNOSIS — Z87891 Personal history of nicotine dependence: Secondary | ICD-10-CM | POA: Insufficient documentation

## 2015-09-07 DIAGNOSIS — F418 Other specified anxiety disorders: Secondary | ICD-10-CM | POA: Insufficient documentation

## 2015-09-07 DIAGNOSIS — G5601 Carpal tunnel syndrome, right upper limb: Secondary | ICD-10-CM | POA: Insufficient documentation

## 2015-09-07 DIAGNOSIS — K219 Gastro-esophageal reflux disease without esophagitis: Secondary | ICD-10-CM | POA: Insufficient documentation

## 2015-09-07 HISTORY — PX: CARPAL TUNNEL RELEASE: SHX101

## 2015-09-07 SURGERY — CARPAL TUNNEL RELEASE
Anesthesia: General | Site: Hand | Laterality: Right | Wound class: Clean

## 2015-09-07 MED ORDER — LACTATED RINGERS IV SOLN
INTRAVENOUS | Status: DC
  Administered 2015-09-07: 09:00:00 via INTRAVENOUS

## 2015-09-07 MED ORDER — FAMOTIDINE 20 MG PO TABS
ORAL_TABLET | ORAL | Status: AC
  Filled 2015-09-07: qty 1

## 2015-09-07 MED ORDER — PROPOFOL 10 MG/ML IV BOLUS
INTRAVENOUS | Status: DC | PRN
  Administered 2015-09-07: 200 mg via INTRAVENOUS

## 2015-09-07 MED ORDER — ONDANSETRON HCL 4 MG/2ML IJ SOLN
INTRAMUSCULAR | Status: DC | PRN
  Administered 2015-09-07: 4 mg via INTRAVENOUS

## 2015-09-07 MED ORDER — EPHEDRINE SULFATE 50 MG/ML IJ SOLN
INTRAMUSCULAR | Status: DC | PRN
  Administered 2015-09-07: 10 mg via INTRAVENOUS

## 2015-09-07 MED ORDER — BUPIVACAINE HCL 0.5 % IJ SOLN
INTRAMUSCULAR | Status: DC | PRN
  Administered 2015-09-07: 5 mL

## 2015-09-07 MED ORDER — FAMOTIDINE 20 MG PO TABS
20.0000 mg | ORAL_TABLET | Freq: Once | ORAL | Status: AC
  Administered 2015-09-07: 20 mg via ORAL

## 2015-09-07 MED ORDER — ONDANSETRON HCL 4 MG/2ML IJ SOLN
4.0000 mg | Freq: Once | INTRAMUSCULAR | Status: DC | PRN
Start: 2015-09-07 — End: 2015-09-07

## 2015-09-07 MED ORDER — ACETAMINOPHEN 10 MG/ML IV SOLN
INTRAVENOUS | Status: AC
  Filled 2015-09-07: qty 100

## 2015-09-07 MED ORDER — CHLORHEXIDINE GLUCONATE 4 % EX LIQD: Freq: Once | CUTANEOUS | Status: DC

## 2015-09-07 MED ORDER — BUPIVACAINE HCL (PF) 0.5 % IJ SOLN
INTRAMUSCULAR | Status: AC
  Filled 2015-09-07: qty 30

## 2015-09-07 MED ORDER — MIDAZOLAM HCL 2 MG/2ML IJ SOLN
INTRAMUSCULAR | Status: DC | PRN
  Administered 2015-09-07: 2 mg via INTRAVENOUS

## 2015-09-07 MED ORDER — TRAMADOL HCL 50 MG PO TABS: 50.0000 mg | ORAL_TABLET | Freq: Four times a day (QID) | ORAL | Status: DC | PRN

## 2015-09-07 MED ORDER — DEXAMETHASONE SODIUM PHOSPHATE 4 MG/ML IJ SOLN
INTRAMUSCULAR | Status: DC | PRN
  Administered 2015-09-07: 4 mg via INTRAVENOUS

## 2015-09-07 MED ORDER — LIDOCAINE HCL (CARDIAC) 20 MG/ML IV SOLN
INTRAVENOUS | Status: DC | PRN
  Administered 2015-09-07: 100 mg via INTRAVENOUS

## 2015-09-07 MED ORDER — ACETAMINOPHEN 10 MG/ML IV SOLN
INTRAVENOUS | Status: DC | PRN
  Administered 2015-09-07: 1000 mg via INTRAVENOUS

## 2015-09-07 MED ORDER — FENTANYL CITRATE (PF) 100 MCG/2ML IJ SOLN: 25.0000 ug | INTRAMUSCULAR | Status: DC | PRN

## 2015-09-07 MED ORDER — FENTANYL CITRATE (PF) 100 MCG/2ML IJ SOLN
INTRAMUSCULAR | Status: DC | PRN
  Administered 2015-09-07: 25 ug via INTRAVENOUS
  Administered 2015-09-07: 50 ug via INTRAVENOUS
  Administered 2015-09-07: 25 ug via INTRAVENOUS

## 2015-09-07 SURGICAL SUPPLY — 25 items
BANDAGE ELASTIC 3 LF NS (GAUZE/BANDAGES/DRESSINGS) ×2 IMPLANT
BNDG CMPR MED 5X3 ELC HKLP NS (GAUZE/BANDAGES/DRESSINGS) ×1
BNDG ESMARK 4X12 TAN STRL LF (GAUZE/BANDAGES/DRESSINGS) ×2 IMPLANT
CANISTER SUCT 1200ML W/VALVE (MISCELLANEOUS) ×2 IMPLANT
CAST PADDING 3X4FT ST 30246 (SOFTGOODS) ×2
CHLORAPREP W/TINT 26ML (MISCELLANEOUS) ×2 IMPLANT
GAUZE PETRO XEROFOAM 1X8 (MISCELLANEOUS) ×2 IMPLANT
GAUZE SPONGE 4X4 12PLY STRL (GAUZE/BANDAGES/DRESSINGS) ×2 IMPLANT
GLOVE BIO SURGEON STRL SZ7.5 (GLOVE) ×4 IMPLANT
GOWN STRL REUS W/ TWL LRG LVL3 (GOWN DISPOSABLE) ×2 IMPLANT
GOWN STRL REUS W/TWL LRG LVL3 (GOWN DISPOSABLE) ×4
KIT RM TURNOVER STRD PROC AR (KITS) ×2 IMPLANT
NS IRRIG 500ML POUR BTL (IV SOLUTION) ×2 IMPLANT
PACK EXTREMITY ARMC (MISCELLANEOUS) ×2 IMPLANT
PAD CAST CTTN 3X4 STRL (SOFTGOODS) ×2 IMPLANT
PAD GROUND ADULT SPLIT (MISCELLANEOUS) ×2 IMPLANT
PAD PREP 24X41 OB/GYN DISP (PERSONAL CARE ITEMS) ×1 IMPLANT
PADDING CAST 3IN STRL (MISCELLANEOUS) ×1
PADDING CAST BLEND 3X4 STRL (MISCELLANEOUS) ×1 IMPLANT
PADDING CAST COTTON 3X4 STRL (SOFTGOODS) ×2
STOCKINETTE 48X4 2 PLY STRL (GAUZE/BANDAGES/DRESSINGS) ×1 IMPLANT
STOCKINETTE STRL 4IN 9604848 (GAUZE/BANDAGES/DRESSINGS) ×2 IMPLANT
SUT ETHILON 4-0 (SUTURE) ×2
SUT ETHILON 4-0 FS2 18XMFL BLK (SUTURE) ×1
SUTURE ETHLN 4-0 FS2 18XMF BLK (SUTURE) ×1 IMPLANT

## 2015-09-07 NOTE — Transfer of Care (Signed)
Immediate Anesthesia Transfer of Care Note  Patient: Emily [name]  Procedure(s) Performed: Procedure(s): CARPAL TUNNEL RELEASE (Right)  Patient Location: PACU  Anesthesia Type:General  Level of Consciousness: awake and sedated  Airway & Oxygen Therapy: Patient Spontanous Breathing and Patient connected to nasal cannula oxygen  Post-op Assessment: Report given to RN and Post -op Vital signs reviewed and stable  Post vital signs: Reviewed and stable  Last Vitals:  Filed Vitals:   09/07/15 0838  BP: 127/71  Pulse: 68  Temp: 36.7 C  Resp: 16    Complications: No apparent anesthesia complications

## 2015-09-07 NOTE — Anesthesia Procedure Notes (Signed)
Procedure Name: LMA Insertion Date/Time: 09/07/2015 9:59 AM Performed by: Ginger CarneMICHELET, Deziray Nabi Pre-anesthesia Checklist: Patient identified, Emergency Drugs available, Suction available, Patient being monitored and Timeout performed Patient Re-evaluated:Patient Re-evaluated prior to inductionOxygen Delivery Method: Circle system utilized Preoxygenation: Pre-oxygenation with 100% oxygen Intubation Type: IV induction LMA: LMA inserted LMA Size: 3.5 Tube type: Oral Number of attempts: 1 Placement Confirmation: positive ETCO2 Tube secured with: Tape Dental Injury: Teeth and Oropharynx as per pre-operative assessment

## 2015-09-07 NOTE — Progress Notes (Signed)
Dr Katrinka BlazingSmith in to see pt 0930 - aware of burn on right wrist, advises will proceed with surgery

## 2015-09-07 NOTE — Anesthesia Preprocedure Evaluation (Signed)
Anesthesia Evaluation  Patient identified by MRN, date of birth, ID band Patient awake    Reviewed: Allergy & Precautions, H&P , NPO status , Patient's Chart, lab work & pertinent test results, reviewed documented beta blocker date and time   Airway Mallampati: II  TM Distance: >3 FB Neck ROM: full    Dental   Pulmonary neg pulmonary ROS, shortness of breath and with exertion, former smoker,    Pulmonary exam normal breath sounds clear to auscultation       Cardiovascular Exercise Tolerance: Good hypertension, Pt. on medications Normal cardiovascular exam Rate:Normal     Neuro/Psych PSYCHIATRIC DISORDERS Anxiety Depression Carpal tunnel syndrome  Neuromuscular disease    GI/Hepatic Neg liver ROS, GERD  Controlled,  Endo/Other  Hypothyroidism   Renal/GU negative Renal ROS  negative genitourinary   Musculoskeletal  (+) Arthritis , Osteoarthritis,    Abdominal Normal abdominal exam  (+)   Peds negative pediatric ROS (+)  Hematology negative hematology ROS (+)   Anesthesia Other Findings   Reproductive/Obstetrics negative OB ROS                             Anesthesia Physical Anesthesia Plan  ASA: II  Anesthesia Plan: General   Post-op Pain Management:    Induction: Intravenous  Airway Management Planned: LMA  Additional Equipment:   Intra-op Plan:   Post-operative Plan: Extubation in OR  Informed Consent:   Dental advisory given  Plan Discussed with: CRNA and Surgeon  Anesthesia Plan Comments:         Anesthesia Quick Evaluation

## 2015-09-07 NOTE — Discharge Instructions (Signed)
Elevate arrm at all times May remove entire bandage in 24 hours, bathe, get wet etc., cover wound with bandaid and wear brace prn    AMBULATORY SURGERY  DISCHARGE INSTRUCTIONS   1) The drugs that you were given will stay in your system until tomorrow so for the next 24 hours you should not:  A) Drive an automobile B) Make any legal decisions C) Drink any alcoholic beverage   2) You may resume regular meals tomorrow.  Today it is better to start with liquids and gradually work up to solid foods.  You may eat anything you prefer, but it is better to start with liquids, then soup and crackers, and gradually work up to solid foods.   3) Please notify your doctor immediately if you have any unusual bleeding, trouble breathing, redness and pain at the surgery site, drainage, fever, or pain not relieved by medication.    4) Additional Instructions:        Please contact your physician with any problems or Same Day Surgery at [phone], Monday through Friday 6 am to 4 pm, or [place] at [facility] Main number at [phone].

## 2015-09-07 NOTE — H&P (Signed)
  54 year old female with right carpal tunnel syndrome.  History and physical exam has been inserted into the record in the form of a paper document.  Heart and lungs are clear.  ENT is normal.  Plan: right carpal tunnel release.

## 2015-09-07 NOTE — Progress Notes (Signed)
Pr has area on right wrist, reddened, states "I burned myself about a week ago", OR room called, Alden HippMelody Tilly, RN will notify Dr. Katrinka BlazingSmith of same, will proceed with getting pt ready for surgery

## 2015-09-07 NOTE — Brief Op Note (Signed)
09/07/2015  10:54 AM  PATIENT:  Chauncey Fischerina W Ranes  54 y.o. female  PRE-OPERATIVE DIAGNOSIS:  CARPAL TUNNEL SYNDROME, right   POST-OPERATIVE DIAGNOSIS:  CARPAL TUNNEL SYNDROME, right   PROCEDURE:  Procedure(s): CARPAL TUNNEL RELEASE (Right)  SURGEON:  Surgeon(s) and Role:    * Myra Rudehristopher Ryanna Teschner, MD - Primary  PHYSICIAN ASSISTANT:   ASSISTANTS: none   ANESTHESIA:   general  EBL:  Total I/O In: 400 [I.V.:400] Out: 0   BLOOD ADMINISTERED:none  DRAINS: none   LOCAL MEDICATIONS USED:  MARCAINE     SPECIMEN:  No Specimen  DISPOSITION OF SPECIMEN:  N/A  COUNTS:  YES  TOURNIQUET:    DICTATION: .Other Dictation: Dictation Number [id number]  PLAN OF CARE: Discharge to home after PACU  PATIENT DISPOSITION:  PACU - hemodynamically stable.   Delay start of Pharmacological VTE agent (>24hrs) due to surgical blood loss or risk of bleeding: not applicable

## 2015-09-08 NOTE — Op Note (Signed)
NAMAngela [name]:  [name], Emily                 ACCOUNT NO.:  [id number]  MEDICAL RECORD NO.:  [id number]  LOCATION:  [facility]                         FACILITY:  [facility]  PHYSICIAN:  Reita Chardhris Corley Maffeo, MD        DATE OF BIRTH:  09-17-1961  DATE OF PROCEDURE:  09/07/2015 DATE OF DISCHARGE:  09/07/2015                              OPERATIVE REPORT   PREOPERATIVE DIAGNOSIS:  Right carpal tunnel syndrome.  POSTOPERATIVE DIAGNOSIS:  Right carpal tunnel syndrome.  PROCEDURE:  Right carpal tunnel release.  SURGEON:  Reita Chardhris Lachina Salsberry, MD  ANESTHESIA:  General.  COMPLICATIONS:  None.  TOURNIQUET TIME:  17 minutes.  DESCRIPTION OF PROCEDURE:  After adequate induction of general anesthesia, the right upper extremity was thoroughly prepped with alcohol and ChloraPrep and draped in standard sterile fashion.  The extremity was prepped out with the Esmarch bandage and pneumatic tourniquet elevated to 250 mmHg.  Under loupe magnification, standard volar carpal tunnel incision was made and the dissection was carried down to the transverse retinacular ligament.  This was incised in the midportion with the knife.  The distal release was performed with the small scissors and the proximal release was performed with the small scissors and the carpal tunnel scissors.  Careful check was made both proximally and distally to ensure the complete release had been obtained.  The wound was thoroughly irrigated multiple times.  Skin edges are infiltrated with 0.5% plain Marcaine.  Skin was closed with 4- 0 nylon.  Soft bulky dressing was applied.  Tourniquet was released. Patient was returned to the recovery room in satisfactory condition having tolerated the procedure quite well.          ______________________________ Reita Chardhris Manar Smalling, MD     CS/MEDQ  D:  09/07/2015  T:  09/08/2015  Job:  [id number]

## 2015-09-10 NOTE — Anesthesia Postprocedure Evaluation (Signed)
Anesthesia Post Note  Patient: Emily [name]  Procedure(s) Performed: Procedure(s) (LRB): CARPAL TUNNEL RELEASE (Right)  Patient location during evaluation: PACU Anesthesia Type: General Level of consciousness: awake, awake and alert and oriented Pain management: pain level controlled Vital Signs Assessment: post-procedure vital signs reviewed and stable Respiratory status: spontaneous breathing Cardiovascular status: blood pressure returned to baseline Anesthetic complications: no    Last Vitals:  Filed Vitals:   09/07/15 1131 09/07/15 1137  BP: 103/55 109/61  Pulse: 66 67  Temp: 36.4 C   Resp: 16 16    Last Pain:  Filed Vitals:   09/07/15 1155  PainSc: 0-No pain                 [name]

## 2015-10-16 ENCOUNTER — Encounter: Payer: BLUE CROSS/BLUE SHIELD | Admitting: Family Medicine

## 2015-10-25 ENCOUNTER — Other Ambulatory Visit: Payer: Self-pay | Admitting: Family Medicine

## 2015-10-31 ENCOUNTER — Other Ambulatory Visit: Payer: Self-pay | Admitting: Unknown Physician Specialty

## 2015-10-31 NOTE — Telephone Encounter (Signed)
Your patient.  Thanks 

## 2015-11-04 ENCOUNTER — Other Ambulatory Visit: Payer: Self-pay | Admitting: Unknown Physician Specialty

## 2015-11-05 NOTE — Telephone Encounter (Signed)
Your patient.  Thanks 

## 2015-11-12 ENCOUNTER — Ambulatory Visit
Admission: RE | Admit: 2015-11-12 | Discharge: 2015-11-12 | Disposition: A | Discharge status: Home / Self Care | Payer: BLUE CROSS/BLUE SHIELD | Source: Ambulatory Visit | Attending: Physician Assistant | Admitting: Physician Assistant

## 2015-11-12 ENCOUNTER — Telehealth: Payer: Self-pay | Admitting: *Deleted

## 2015-11-12 ENCOUNTER — Ambulatory Visit (INDEPENDENT_AMBULATORY_CARE_PROVIDER_SITE_OTHER): Payer: BLUE CROSS/BLUE SHIELD | Admitting: Physician Assistant

## 2015-11-12 ENCOUNTER — Other Ambulatory Visit
Admission: RE | Admit: 2015-11-12 | Discharge: 2015-11-12 | Disposition: A | Discharge status: Home / Self Care | Payer: BLUE CROSS/BLUE SHIELD | Source: Ambulatory Visit | Attending: Physician Assistant | Admitting: Physician Assistant

## 2015-11-12 ENCOUNTER — Encounter: Payer: Self-pay | Admitting: Physician Assistant

## 2015-11-12 VITALS — BP 142/82 | HR 78 | Ht 63.0 in | Wt 202.2 lb

## 2015-11-12 DIAGNOSIS — E039 Hypothyroidism, unspecified: Secondary | ICD-10-CM

## 2015-11-12 DIAGNOSIS — I10 Essential (primary) hypertension: Secondary | ICD-10-CM

## 2015-11-12 DIAGNOSIS — Z01812 Encounter for preprocedural laboratory examination: Secondary | ICD-10-CM

## 2015-11-12 DIAGNOSIS — R000 Tachycardia, unspecified: Secondary | ICD-10-CM

## 2015-11-12 DIAGNOSIS — R0602 Shortness of breath: Secondary | ICD-10-CM | POA: Diagnosis not present

## 2015-11-12 DIAGNOSIS — R5383 Other fatigue: Secondary | ICD-10-CM

## 2015-11-12 DIAGNOSIS — I200 Unstable angina: Secondary | ICD-10-CM

## 2015-11-12 LAB — CBC
HCT: 40.7 % (ref 35.0–47.0)
Hemoglobin: 13.7 g/dL (ref 12.0–16.0)
MCH: 30.9 pg (ref 26.0–34.0)
MCHC: 33.7 g/dL (ref 32.0–36.0)
MCV: 91.6 fL (ref 80.0–100.0)
PLATELETS: 226 10*3/uL (ref 150–440)
RBC: 4.44 MIL/uL (ref 3.80–5.20)
RDW: 12.9 % (ref 11.5–14.5)
WBC: 7.5 10*3/uL (ref 3.6–11.0)

## 2015-11-12 LAB — BASIC METABOLIC PANEL
ANION GAP: 9 (ref 5–15)
BUN: 19 mg/dL (ref 6–20)
CALCIUM: 9.6 mg/dL (ref 8.9–10.3)
CO2: 27 mmol/L (ref 22–32)
CREATININE: 0.63 mg/dL (ref 0.44–1.00)
Chloride: 103 mmol/L (ref 101–111)
GFR calc Af Amer: >60 mL/min (ref >60)
GLUCOSE: 97 mg/dL (ref 65–99)
Potassium: 3.6 mmol/L (ref 3.5–5.1)
Sodium: 139 mmol/L (ref 135–145)

## 2015-11-12 LAB — PROTIME-INR
INR: 0.97
PROTHROMBIN TIME: 13.1 s (ref 11.4–15.0)

## 2015-11-12 LAB — TSH: TSH: 5.303 u[IU]/mL — AB (ref 0.350–4.500)

## 2015-11-12 NOTE — Progress Notes (Signed)
 Cardiology Office Note Date:  11/12/2015  Patient ID:  Emily [name], DOB 02/23/1961, MRN [id number] PCP:  [name], DO  Cardiologist:  Dr. Gollan, MD    Chief Complaint: Chest pain and SOB for > 6 months  History of Present Illness: Emily [name] is a 54 y.o. female with history of HTN, obesity, depression, and hypothyroidism who was last seen in clinic on 10/2013 for SOB and neck/jaw pain returns to the office today for evaluation of SOB and fatigue.   She had a treadmill stress test in the office on 11/01/2013 that was normal. Since that visit she has had continued SOB, fatigue, and intermittent chest pain. She does have a remote history of tobacco abuse when she was younger for 15 years at <1ppd, no DM. Her father had in MI in his 60s, other grandparents had heart attacks in their 60s. Mother had stroke and heart failure in her 70s. She also reports a cousin who recently passed away from an MI at age 53 as well as a friend in her 40s. She was sitting in church 3 months ago when she developed chest pain radiating to her jaw. She did not seek medical attention at that time. She has has more recently developed shoulder, neck, and intermittent chest pain. Symptoms are not related to exertion or rest. Some nausea and diaphoresis. Weight has also been increasing. She relates this to no longer exercising as she is afraid to do so. She does not check her BP at home so is uncertain of her readings.    Past Medical History  Diagnosis Date  . Thyroid disease   . Hypertension   . HTN (hypertension)   . Depression   . Anxiety   . Myalgia   . Hypothyroidism   . GERD (gastroesophageal reflux disease)     Past Surgical History  Procedure Laterality Date  . Cholecystectomy    . Knee surgery Right   . Tubal ligation    . Carpal tunnel release Left 07/03/2015    Procedure: CARPAL TUNNEL RELEASE;  Surgeon: Christopher Smith, MD;  Location: [facility] ORS;  Service: Orthopedics;  Laterality: Left;  .  Carpal tunnel release Right 09/07/2015    Procedure: CARPAL TUNNEL RELEASE;  Surgeon: Christopher Smith, MD;  Location: [facility] ORS;  Service: Orthopedics;  Laterality: Right;    Current Outpatient Prescriptions  Medication Sig Dispense Refill  . cholecalciferol (VITAMIN D) 1000 UNITS tablet Take 1,000 Units by mouth daily.    . levothyroxine (SYNTHROID, LEVOTHROID) 75 MCG tablet TAKE 1 TABLET BY MOUTH EVERY DAY 30 tablet 0  . losartan (COZAAR) 100 MG tablet TAKE 1 TABLET BY MOUTH EVERY DAY 30 tablet 0  . meloxicam (MOBIC) 7.5 MG tablet Take 7.5 mg by mouth daily.   3  . Naproxen Sodium (ALEVE) 220 MG CAPS Take 1 capsule by mouth as needed.    . Nutritional Supplements (JUICE PLUS FIBRE PO) Take by mouth daily.    . Omega-3 Fatty Acids (FISH OIL PO) Take 1 tablet by mouth daily.    . traMADol (ULTRAM) 50 MG tablet Take 1 tablet (50 mg total) by mouth every 6 (six) hours as needed. 30 tablet 0  . venlafaxine XR (EFFEXOR-XR) 150 MG 24 hr capsule TAKE 1 CAPSULE(150 MG) BY MOUTH DAILY WITH BREAKFAST 90 capsule 1   No current facility-administered medications for this visit.    Allergies:   Review of patient's allergies indicates no known allergies.   Social History:  The patient    reports that she quit smoking about 20 years ago. Her smoking use included Cigarettes. She has a 2.5 pack-year smoking history. She has never used smokeless tobacco. She reports that she does not drink alcohol or use illicit drugs.   Family History:  The patient's family history includes Alzheimer's disease in her father; CAD (age of onset: 53) in her cousin; Heart attack (age of onset: 57) in her father; Heart disease in her father and mother; Heart failure in her mother; Hyperlipidemia in her father; Hypertension in her father and mother; Stroke in her mother.  ROS:   Review of Systems  Constitutional: Positive for malaise/fatigue and diaphoresis. Negative for fever, chills and weight loss.  HENT: Negative for  congestion.   Eyes: Negative for discharge and redness.  Respiratory: Positive for shortness of breath. Negative for cough, hemoptysis, sputum production and wheezing.   Cardiovascular: Positive for chest pain and palpitations. Negative for orthopnea, claudication, leg swelling and PND.  Gastrointestinal: Negative for nausea, vomiting and abdominal pain.  Musculoskeletal: Negative for falls.  Skin: Negative for rash.  Neurological: Positive for weakness. Negative for dizziness, tingling, tremors, sensory change, speech change, focal weakness and loss of consciousness.  Endo/Heme/Allergies: Does not bruise/bleed easily.  Psychiatric/Behavioral: Negative for substance abuse. The patient is nervous/anxious.   All other systems reviewed and are negative.    PHYSICAL EXAM:  VS:  BP 142/82 mmHg  Pulse 78  Ht 5' 3" (1.6 m)  Wt 202 lb 4 oz (91.74 kg)  BMI 35.84 kg/m2 BMI: Body mass index is 35.84 kg/(m^2). Well nourished, well developed, in no acute distress HEENT: normocephalic, atraumatic Neck: no JVD, carotid bruits or masses Cardiac:  normal S1, S2; RRR; no murmurs, rubs, or gallops Lungs:  clear to auscultation bilaterally, no wheezing, rhonchi or rales Abd: obese, soft, nontender, no hepatomegaly, + BS MS: no deformity or atrophy Ext: no edema Skin: warm and dry, no rash Neuro:  moves all extremities spontaneously, no focal abnormalities noted, follows commands Psych: euthymic mood, full affect   EKG:  Was ordered today. Shows NSR, 79 bpm, slight lateral st flattening   Recent Labs: No results found for requested labs within last 365 days.  No results found for requested labs within last 365 days.   CrCl cannot be calculated (Patient has no serum creatinine result on file.).   Wt Readings from Last 3 Encounters:  11/12/15 202 lb 4 oz (91.74 kg)  09/07/15 185 lb (83.915 kg)  08/27/15 192 lb (87.091 kg)     Other studies reviewed: Additional studies/records reviewed today  include: summarized above  ASSESSMENT AND PLAN:  1. Unstable angina: Patient prefers cardiac catheterization at this time over nuclear stress testing stating, "I just want to know." She declines nuclear stress testing. Risks and benefits of cardiac catheterization have been discussed with the patient including risks of bleeding, bruising, infection, kidney damage, stroke, heart attack, and death. The patient understands these risks and is willing to proceed with the procedure. All questions have been answered and concerns listened to. She and her husband agreeable to move forward with cardiac cath after listening to the above risks. She will need to hold losartan and her prn NSAIDs pre-cath.   2. HTN: Well controlled at this time. Continue losartan 100 mg daily.   3. SOB/fatigue/obesity: Likely all playing a role together. Check a TSH as she does have hypothyroidism. Weight loss is advised. Possible sleep apnea. Sleep study is advised.   4. Hypothyroidism: On Synthroid per PCP.     Disposition: F/u with Dr. Gollan s/p cardiac cath  Current medicines are reviewed at length with the patient today.  The patient did not have any concerns regarding medicines.  Signed, Jaeliana Lococo PA-C 11/12/2015 4:48 PM     [facility] [address] [place], [place] [address] [phone]  

## 2015-11-12 NOTE — Telephone Encounter (Signed)
Pt husband calling stating pt is having some of the old symptoms again. SOB Shoulder pain and fatigue Been going on for 6 months Pt c/o Shortness Of Breath: STAT if SOB developed within the last 24 hours or pt is noticeably SOB on the phone  1. Are you currently SOB (can you hear that pt is SOB on the phone)? Can't tell   2. How long have you been experiencing SOB? 6 months   3. Are you SOB when sitting or when up moving around? Random   4. Are you currently experiencing any other symptoms?   Shoulder pain, states she also has some tightness in her chest.  Denies sob,cp right now.  Please advise.    (pt is coming to see Eula Listen P.A at 3:30 pm)

## 2015-11-12 NOTE — Patient Instructions (Addendum)
Medication Instructions:  Your physician recommends that you continue on your current medications as directed. Please refer to the Current Medication list given to you today.   Labwork: BMET, CBC, PT/INR, TSH at the hospital  Testing/Procedures: Your physician has requested that you have a cardiac catheterization. Cardiac catheterization is used to diagnose and/or treat various heart conditions. Doctors may recommend this procedure for a number of different reasons. The most common reason is to evaluate chest pain. Chest pain can be a symptom of coronary artery disease (CAD), and cardiac catheterization can show whether plaque is narrowing or blocking your heart's arteries. This procedure is also used to evaluate the valves, as well as measure the blood flow and oxygen levels in different parts of your heart. For further information please visit [email or web address] Please follow instruction sheet, as given.  [facility] Cardiac Cath Instructions   You are scheduled for a Cardiac Cath on:_________________________  Please arrive at _______am on the day of your procedure  You will need to pre-register prior to the day of your procedure.  Enter through the [employer] at [facility].  Registration is the first desk on your right.  Please take the procedure order we have given you in order to be registered appropriately  Do not eat/drink anything after midnight  Someone will need to drive you home  It is recommended someone be with you for the first 24 hours after your procedure  Wear clothes that are easy to get on/off and wear slip on shoes if possible   Medications bring a current list of all medications with you   __xx_ Do not take these medications before your procedure: Hold losartan, meloxicam, and naproxen the morning of your procedure.   Day of your procedure: Arrive at the [facility] entrance.  Free valet service is available.  After entering the [facility] please check-in at the  registration desk (1st desk on your right) to receive your armband. After receiving your armband someone will escort you to the cardiac cath/special procedures waiting area.  The usual length of stay after your procedure is about 2 to 3 hours.  This can vary.  If you have any questions, please call our office at [phone], or you may call the cardiac cath lab at [facility] directly at [phone]   A chest x-ray takes a picture of the organs and structures inside the chest, including the heart, lungs, and blood vessels. This test can show several things, including, whether the heart is enlarges; whether fluid is building up in the lungs; and whether pacemaker / defibrillator leads are still in place.   Follow-Up: Your physician recommends that you schedule a follow-up appointment with Dr. Mariah Milling after cath.   Any Other Special Instructions Will Be Listed Below (If Applicable).     If you need a refill on your cardiac medications before your next appointment, please call your pharmacy.  Angiogram An angiogram, also called angiography, is a procedure used to look at the blood vessels. In this procedure, dye is injected through a long, thin tube (catheter) into an artery. X-rays are then taken. The X-rays will show if there is a blockage or problem in a blood vessel.  LET [facility] CARE PROVIDER KNOW ABOUT:  Any allergies you have, including allergies to shellfish or contrast dye.   All medicines you are taking, including vitamins, herbs, eye drops, creams, and over-the-counter medicines.   Previous problems you or members of your family have had with the use of anesthetics.  Any blood disorders you have.   Previous surgeries you have had.  Any previous kidney problems or failure you have had.  Medical conditions you have.   Possibility of pregnancy, if this applies. RISKS AND COMPLICATIONS Generally, an angiogram is a safe procedure. However, as with any procedure,  problems can occur. Possible problems include:  Injury to the blood vessels, including rupture or bleeding.  Infection or bruising at the catheter site.  Allergic reaction to the dye or contrast used.  Kidney damage from the dye or contrast used.  Blood clots that can lead to a stroke or heart attack. BEFORE THE PROCEDURE  Do not eat or drink after midnight on the night before the procedure, or as directed by your health care provider.   Ask your health care provider if you may drink enough water to take any needed medicines the morning of the procedure.  PROCEDURE  You may be given a medicine to help you relax (sedative) before and during the procedure. This medicine is given through an IV access tube that is inserted into one of your veins.   The area where the catheter will be inserted will be washed and shaved. This is usually done in the groin but may be done in the fold of your arm (near your elbow) or in the wrist.  A medicine will be given to numb the area where the catheter will be inserted (local anesthetic).  The catheter will be inserted with a guide wire into an artery. The catheter is guided by using a type of X-ray (fluoroscopy) to the blood vessel being examined.   Dye is then injected into the catheter, and X-rays are taken. The dye helps to show where any narrowing or blockages are located.  AFTER THE PROCEDURE   If the procedure is done through the leg, you will be kept in bed lying flat for several hours. You will be instructed to not bend or cross your legs.  The insertion site will be checked frequently.  The pulse in your feet or wrist will be checked frequently.  Additional blood tests, X-rays, and electrocardiography may be done.   You may need to stay in the hospital overnight for observation.    This information is not intended to replace advice given to you by your health care provider. Make sure you discuss any questions you have with your  health care provider.   Document Released: 07/16/2005 Document Revised: 10/27/2014 Document Reviewed: 03/09/2013 Elsevier Interactive Patient Education [employer]

## 2015-11-13 ENCOUNTER — Telehealth: Payer: Self-pay

## 2015-11-13 ENCOUNTER — Other Ambulatory Visit: Payer: Self-pay

## 2015-11-13 ENCOUNTER — Ambulatory Visit
Admission: RE | Admit: 2015-11-13 | Discharge: 2015-11-13 | Disposition: A | Discharge status: Home / Self Care | Payer: BLUE CROSS/BLUE SHIELD | Source: Ambulatory Visit | Attending: Physician Assistant | Admitting: Physician Assistant

## 2015-11-13 ENCOUNTER — Ambulatory Visit
Admission: RE | Admit: 2015-11-13 | Discharge: 2015-11-13 | Disposition: A | Discharge status: Home / Self Care | Payer: BLUE CROSS/BLUE SHIELD | Source: Ambulatory Visit | Attending: Cardiovascular Disease | Admitting: Cardiovascular Disease

## 2015-11-13 DIAGNOSIS — Z01818 Encounter for other preprocedural examination: Secondary | ICD-10-CM | POA: Insufficient documentation

## 2015-11-13 DIAGNOSIS — E039 Hypothyroidism, unspecified: Secondary | ICD-10-CM

## 2015-11-13 DIAGNOSIS — R0602 Shortness of breath: Secondary | ICD-10-CM | POA: Insufficient documentation

## 2015-11-13 DIAGNOSIS — Z01812 Encounter for preprocedural laboratory examination: Secondary | ICD-10-CM

## 2015-11-13 LAB — T4, FREE: FREE T4: 0.78 ng/dL (ref 0.61–1.12)

## 2015-11-13 NOTE — Telephone Encounter (Signed)
Left message on pt home VM of Feb 2, 10:30am at [facility] with arrival time of 9:30am. Requested CB to confirm receipt of message.

## 2015-11-13 NOTE — Telephone Encounter (Signed)
Pt spouse, Trey Paula, returned called and states pt would like cath sooner than Feb 2 and are willing to have procedure at [facility]. Per Eula Listen, ok to schedule at [facility].  Left heart cath with Dr. Kirke Corin Jan 25. Arrival time 8am, procedure time 10am.  Reviewed date, time, and instructions w/Jeff who verbalized understanding and is agreeable w/plan. Labs yesterday . No CXR as he states they attempted but were told it could not be done as the orders were not signed. I have printed and have had Ryan sign a copy of the CXR order for pt to p/u today. Spouse understands to come by our office to pick this up then go to [facility] for xray. Spouse had no further questions.

## 2015-11-13 NOTE — Telephone Encounter (Signed)
S/w Darel Hong in scheduling. Feb 2 cath at [facility] cancelled and moved to [facility] Jan 25

## 2015-11-14 ENCOUNTER — Encounter (HOSPITAL_COMMUNITY)
Admission: RE | Disposition: A | Discharge status: Home / Self Care | Payer: Self-pay | Source: Ambulatory Visit | Attending: Cardiovascular Disease

## 2015-11-14 ENCOUNTER — Ambulatory Visit (HOSPITAL_COMMUNITY)
Admission: RE | Admit: 2015-11-14 | Discharge: 2015-11-14 | Disposition: A | Discharge status: Home / Self Care | Payer: BLUE CROSS/BLUE SHIELD | Source: Ambulatory Visit | Attending: Cardiovascular Disease | Admitting: Cardiovascular Disease

## 2015-11-14 DIAGNOSIS — R6884 Jaw pain: Secondary | ICD-10-CM | POA: Diagnosis not present

## 2015-11-14 DIAGNOSIS — Z6835 Body mass index (BMI) 35.0-35.9, adult: Secondary | ICD-10-CM | POA: Insufficient documentation

## 2015-11-14 DIAGNOSIS — F329 Major depressive disorder, single episode, unspecified: Secondary | ICD-10-CM | POA: Diagnosis not present

## 2015-11-14 DIAGNOSIS — Z8249 Family history of ischemic heart disease and other diseases of the circulatory system: Secondary | ICD-10-CM | POA: Diagnosis not present

## 2015-11-14 DIAGNOSIS — M791 Myalgia: Secondary | ICD-10-CM | POA: Insufficient documentation

## 2015-11-14 DIAGNOSIS — K219 Gastro-esophageal reflux disease without esophagitis: Secondary | ICD-10-CM | POA: Insufficient documentation

## 2015-11-14 DIAGNOSIS — I200 Unstable angina: Secondary | ICD-10-CM | POA: Insufficient documentation

## 2015-11-14 DIAGNOSIS — Z87891 Personal history of nicotine dependence: Secondary | ICD-10-CM | POA: Diagnosis not present

## 2015-11-14 DIAGNOSIS — E669 Obesity, unspecified: Secondary | ICD-10-CM | POA: Insufficient documentation

## 2015-11-14 DIAGNOSIS — F419 Anxiety disorder, unspecified: Secondary | ICD-10-CM | POA: Diagnosis not present

## 2015-11-14 DIAGNOSIS — I10 Essential (primary) hypertension: Secondary | ICD-10-CM | POA: Insufficient documentation

## 2015-11-14 DIAGNOSIS — E039 Hypothyroidism, unspecified: Secondary | ICD-10-CM | POA: Diagnosis not present

## 2015-11-14 DIAGNOSIS — R5383 Other fatigue: Secondary | ICD-10-CM | POA: Diagnosis not present

## 2015-11-14 HISTORY — PX: CARDIAC CATHETERIZATION: SHX172

## 2015-11-14 SURGERY — LEFT HEART CATH AND CORONARY ANGIOGRAPHY

## 2015-11-14 MED ORDER — IOHEXOL 350 MG/ML SOLN
INTRAVENOUS | Status: DC | PRN
  Administered 2015-11-14: 60 mL via INTRA_ARTERIAL

## 2015-11-14 MED ORDER — HEPARIN SODIUM (PORCINE) 1000 UNIT/ML IJ SOLN
INTRAMUSCULAR | Status: DC | PRN
  Administered 2015-11-14: 5000 [IU] via INTRAVENOUS

## 2015-11-14 MED ORDER — HEPARIN SODIUM (PORCINE) 1000 UNIT/ML IJ SOLN
INTRAMUSCULAR | Status: AC
  Filled 2015-11-14: qty 1

## 2015-11-14 MED ORDER — ASPIRIN 81 MG PO CHEW
81.0000 mg | CHEWABLE_TABLET | ORAL | Status: AC
Start: 2015-11-14 — End: 2015-11-14
  Administered 2015-11-14: 81 mg via ORAL

## 2015-11-14 MED ORDER — LIDOCAINE HCL (PF) 1 % IJ SOLN
INTRAMUSCULAR | Status: AC
  Filled 2015-11-14: qty 30

## 2015-11-14 MED ORDER — ASPIRIN 81 MG PO CHEW
CHEWABLE_TABLET | ORAL | Status: AC
  Filled 2015-11-14: qty 1

## 2015-11-14 MED ORDER — SODIUM CHLORIDE 0.9 % IV SOLN: 250.0000 mL | INTRAVENOUS | Status: DC | PRN

## 2015-11-14 MED ORDER — SODIUM CHLORIDE 0.9% FLUSH: 3.0000 mL | Freq: Two times a day (BID) | INTRAVENOUS | Status: DC

## 2015-11-14 MED ORDER — FENTANYL CITRATE (PF) 100 MCG/2ML IJ SOLN
INTRAMUSCULAR | Status: DC | PRN
  Administered 2015-11-14: 50 ug via INTRAVENOUS

## 2015-11-14 MED ORDER — MIDAZOLAM HCL 2 MG/2ML IJ SOLN
INTRAMUSCULAR | Status: DC | PRN
  Administered 2015-11-14: 1 mg via INTRAVENOUS

## 2015-11-14 MED ORDER — ACETAMINOPHEN 325 MG PO TABS
650.0000 mg | ORAL_TABLET | Freq: Once | ORAL | Status: AC
  Administered 2015-11-14: 650 mg via ORAL

## 2015-11-14 MED ORDER — FENTANYL CITRATE (PF) 100 MCG/2ML IJ SOLN
INTRAMUSCULAR | Status: AC
  Filled 2015-11-14: qty 2

## 2015-11-14 MED ORDER — SODIUM CHLORIDE 0.9 % WEIGHT BASED INFUSION: 1.0000 mL/kg/h | INTRAVENOUS | Status: AC

## 2015-11-14 MED ORDER — SODIUM CHLORIDE 0.9% FLUSH: 3.0000 mL | INTRAVENOUS | Status: DC | PRN

## 2015-11-14 MED ORDER — SODIUM CHLORIDE 0.9 % WEIGHT BASED INFUSION
3.0000 mL/kg/h | INTRAVENOUS | Status: AC
  Administered 2015-11-14: 3 mL/kg/h via INTRAVENOUS

## 2015-11-14 MED ORDER — MIDAZOLAM HCL 2 MG/2ML IJ SOLN
INTRAMUSCULAR | Status: AC
  Filled 2015-11-14: qty 2

## 2015-11-14 MED ORDER — LIDOCAINE HCL (PF) 1 % IJ SOLN
INTRAMUSCULAR | Status: DC | PRN
  Administered 2015-11-14: 5 mL

## 2015-11-14 MED ORDER — VERAPAMIL HCL 2.5 MG/ML IV SOLN
INTRAVENOUS | Status: AC
  Filled 2015-11-14: qty 2

## 2015-11-14 MED ORDER — SODIUM CHLORIDE 0.9 % WEIGHT BASED INFUSION: 1.0000 mL/kg/h | INTRAVENOUS | Status: DC

## 2015-11-14 MED ORDER — HEPARIN (PORCINE) IN NACL 2-0.9 UNIT/ML-% IJ SOLN
INTRAMUSCULAR | Status: AC
  Filled 2015-11-14: qty 1000

## 2015-11-14 MED ORDER — VERAPAMIL HCL 2.5 MG/ML IV SOLN
INTRAVENOUS | Status: DC | PRN
  Administered 2015-11-14: 8 mL via INTRA_ARTERIAL

## 2015-11-14 MED ORDER — ACETAMINOPHEN 325 MG PO TABS
ORAL_TABLET | ORAL | Status: AC
  Filled 2015-11-14: qty 2

## 2015-11-14 SURGICAL SUPPLY — 11 items
CATH INFINITI 5 FR JL3.5 (CATHETERS) ×2 IMPLANT
CATH INFINITI 5FR ANG PIGTAIL (CATHETERS) ×3 IMPLANT
CATH OPTITORQUE JACKY 4.0 5F (CATHETERS) ×3 IMPLANT
DEVICE RAD COMP TR BAND LRG (VASCULAR PRODUCTS) ×3 IMPLANT
GLIDESHEATH SLEND SS 6F .021 (SHEATH) ×3 IMPLANT
KIT HEART LEFT (KITS) ×3 IMPLANT
PACK CARDIAC CATHETERIZATION (CUSTOM PROCEDURE TRAY) ×3 IMPLANT
SYR MEDRAD MARK V 150ML (SYRINGE) ×3 IMPLANT
TRANSDUCER W/STOPCOCK (MISCELLANEOUS) ×3 IMPLANT
TUBING CIL FLEX 10 FLL-RA (TUBING) ×3 IMPLANT
WIRE SAFE-T 1.5MM-J .035X260CM (WIRE) ×3 IMPLANT

## 2015-11-14 NOTE — Interval H&P Note (Signed)
History and Physical Interval Note:  11/14/2015 10:19 AM  Emily [name]  has presented today for surgery, with the diagnosis of cp  The various methods of treatment have been discussed with the patient and family. After consideration of risks, benefits and other options for treatment, the patient has consented to  Procedure(s): Left Heart Cath and Coronary Angiography (N/A) as a surgical intervention .  The patient's history has been reviewed, patient examined, no change in status, stable for surgery.  I have reviewed the patient's chart and labs.  Questions were answered to the patient's satisfaction.     [name]

## 2015-11-14 NOTE — H&P (View-Only) (Signed)
Cardiology Office Note Date:  11/12/2015  Patient ID:  Emily, [name] [birth year], MRN [id number] PCP:  Olevia Perches, DO  Cardiologist:  Dr. Mariah Milling, MD    Chief Complaint: Chest pain and SOB for > 6 months  History of Present Illness: Emily [name] is a 55 y.o. female with history of HTN, obesity, depression, and hypothyroidism who was last seen in clinic on 10/2013 for SOB and neck/jaw pain returns to the office today for evaluation of SOB and fatigue.   She had a treadmill stress test in the office on 11/01/2013 that was normal. Since that visit she has had continued SOB, fatigue, and intermittent chest pain. She does have a remote history of tobacco abuse when she was younger for 15 years at <1ppd, no DM. Her father had in MI in his 45s, other grandparents had heart attacks in their 53s. Mother had stroke and heart failure in her 82s. She also reports a cousin who recently passed away from an MI at age 78 as well as a friend in her 50s. She was sitting in church 3 months ago when she developed chest pain radiating to her jaw. She did not seek medical attention at that time. She has has more recently developed shoulder, neck, and intermittent chest pain. Symptoms are not related to exertion or rest. Some nausea and diaphoresis. Weight has also been increasing. She relates this to no longer exercising as she is afraid to do so. She does not check her BP at home so is uncertain of her readings.    Past Medical History  Diagnosis Date  . Thyroid disease   . Hypertension   . HTN (hypertension)   . Depression   . Anxiety   . Myalgia   . Hypothyroidism   . GERD (gastroesophageal reflux disease)     Past Surgical History  Procedure Laterality Date  . Cholecystectomy    . Knee surgery Right   . Tubal ligation    . Carpal tunnel release Left 07/03/2015    Procedure: CARPAL TUNNEL RELEASE;  Surgeon: Myra Rude, MD;  Location: [facility] ORS;  Service: Orthopedics;  Laterality: Left;  .  Carpal tunnel release Right 09/07/2015    Procedure: CARPAL TUNNEL RELEASE;  Surgeon: Myra Rude, MD;  Location: [facility] ORS;  Service: Orthopedics;  Laterality: Right;    Current Outpatient Prescriptions  Medication Sig Dispense Refill  . cholecalciferol (VITAMIN D) 1000 UNITS tablet Take 1,000 Units by mouth daily.    Marland Kitchen levothyroxine (SYNTHROID, LEVOTHROID) 75 MCG tablet TAKE 1 TABLET BY MOUTH EVERY DAY 30 tablet 0  . losartan (COZAAR) 100 MG tablet TAKE 1 TABLET BY MOUTH EVERY DAY 30 tablet 0  . meloxicam (MOBIC) 7.5 MG tablet Take 7.5 mg by mouth daily.   3  . Naproxen Sodium (ALEVE) 220 MG CAPS Take 1 capsule by mouth as needed.    . Nutritional Supplements (JUICE PLUS FIBRE PO) Take by mouth daily.    . Omega-3 Fatty Acids (FISH OIL PO) Take 1 tablet by mouth daily.    . traMADol (ULTRAM) 50 MG tablet Take 1 tablet (50 mg total) by mouth every 6 (six) hours as needed. 30 tablet 0  . venlafaxine XR (EFFEXOR-XR) 150 MG 24 hr capsule TAKE 1 CAPSULE(150 MG) BY MOUTH DAILY WITH BREAKFAST 90 capsule 1   No current facility-administered medications for this visit.    Allergies:   Review of patient's allergies indicates no known allergies.   Social History:  The patient  reports that she quit smoking about 20 years ago. Her smoking use included Cigarettes. She has a 2.5 pack-year smoking history. She has never used smokeless tobacco. She reports that she does not drink alcohol or use illicit drugs.   Family History:  The patient's family history includes Alzheimer's disease in her father; CAD (age of onset: 33) in her cousin; Heart attack (age of onset: 31) in her father; Heart disease in her father and mother; Heart failure in her mother; Hyperlipidemia in her father; Hypertension in her father and mother; Stroke in her mother.  ROS:   Review of Systems  Constitutional: Positive for malaise/fatigue and diaphoresis. Negative for fever, chills and weight loss.  HENT: Negative for  congestion.   Eyes: Negative for discharge and redness.  Respiratory: Positive for shortness of breath. Negative for cough, hemoptysis, sputum production and wheezing.   Cardiovascular: Positive for chest pain and palpitations. Negative for orthopnea, claudication, leg swelling and PND.  Gastrointestinal: Negative for nausea, vomiting and abdominal pain.  Musculoskeletal: Negative for falls.  Skin: Negative for rash.  Neurological: Positive for weakness. Negative for dizziness, tingling, tremors, sensory change, speech change, focal weakness and loss of consciousness.  Endo/Heme/Allergies: Does not bruise/bleed easily.  Psychiatric/Behavioral: Negative for substance abuse. The patient is nervous/anxious.   All other systems reviewed and are negative.    PHYSICAL EXAM:  VS:  BP 142/82 mmHg  Pulse 78  Ht  (1.6 m)  Wt 202 lb 4 oz (91.74 kg)  BMI 35.84 kg/m2 BMI: Body mass index is 35.84 kg/(m^2). Well nourished, well developed, in no acute distress HEENT: normocephalic, atraumatic Neck: no JVD, carotid bruits or masses Cardiac:  normal S1, S2; RRR; no murmurs, rubs, or gallops Lungs:  clear to auscultation bilaterally, no wheezing, rhonchi or rales Abd: obese, soft, nontender, no hepatomegaly, + BS MS: no deformity or atrophy Ext: no edema Skin: warm and dry, no rash Neuro:  moves all extremities spontaneously, no focal abnormalities noted, follows commands Psych: euthymic mood, full affect   EKG:  Was ordered today. Shows NSR, 79 bpm, slight lateral st flattening   Recent Labs: No results found for requested labs within last 365 days.  No results found for requested labs within last 365 days.   CrCl cannot be calculated (Patient has no serum creatinine result on file.).   Wt Readings from Last 3 Encounters:  11/12/15 202 lb 4 oz (91.74 kg)  09/07/15 185 lb (83.915 kg)  08/27/15 192 lb (87.091 kg)     Other studies reviewed: Additional studies/records reviewed today  include: summarized above  ASSESSMENT AND PLAN:  1. Unstable angina: Patient prefers cardiac catheterization at this time over nuclear stress testing stating, "I just want to know." She declines nuclear stress testing. Risks and benefits of cardiac catheterization have been discussed with the patient including risks of bleeding, bruising, infection, kidney damage, stroke, heart attack, and death. The patient understands these risks and is willing to proceed with the procedure. All questions have been answered and concerns listened to. She and her husband agreeable to move forward with cardiac cath after listening to the above risks. She will need to hold losartan and her prn NSAIDs pre-cath.   2. HTN: Well controlled at this time. Continue losartan 100 mg daily.   3. SOB/fatigue/obesity: Likely all playing a role together. Check a TSH as she does have hypothyroidism. Weight loss is advised. Possible sleep apnea. Sleep study is advised.   4. Hypothyroidism: On Synthroid per PCP.  Disposition: F/u with Dr. Mariah Milling s/p cardiac cath  Current medicines are reviewed at length with the patient today.  The patient did not have any concerns regarding medicines.  Elinor Dodge PA-C 11/12/2015 4:48 PM     [facility] [place] [address] [place], [place] [address] [phone]

## 2015-11-14 NOTE — Discharge Instructions (Signed)
Radial Site Care °Refer to this sheet in the next few weeks. These instructions provide you with information about caring for yourself after your procedure. Your health care provider may also give you more specific instructions. Your treatment has been planned according to current medical practices, but problems sometimes occur. Call your health care provider if you have any problems or questions after your procedure. °WHAT TO EXPECT AFTER THE PROCEDURE °After your procedure, it is typical to have the following: °· Bruising at the radial site that usually fades within 1-2 weeks. °· Blood collecting in the tissue (hematoma) that may be painful to the touch. It should usually decrease in size and tenderness within 1-2 weeks. °HOME CARE INSTRUCTIONS °· Take medicines only as directed by your health care provider. °· You may shower 24-48 hours after the procedure or as directed by your health care provider. Remove the bandage (dressing) and gently wash the site with plain soap and water. Pat the area dry with a clean towel. Do not rub the site, because this may cause bleeding. °· Do not take baths, swim, or use a hot tub until your health care provider approves. °· Check your insertion site every day for redness, swelling, or drainage. °· Do not apply powder or lotion to the site. °· Do not flex or bend the affected arm for 24 hours or as directed by your health care provider. °· Do not push or pull heavy objects with the affected arm for 24 hours or as directed by your health care provider. °· Do not lift over 10 lb (4.5 kg) for 5 days after your procedure or as directed by your health care provider. °· Ask your health care provider when it is okay to: °¨ Return to work or school. °¨ Resume usual physical activities or sports. °¨ Resume sexual activity. °· Do not drive home if you are discharged the same day as the procedure. Have someone else drive you. °· You may drive 24 hours after the procedure unless otherwise  instructed by your health care provider. °· Do not operate machinery or power tools for 24 hours after the procedure. °· If your procedure was done as an outpatient procedure, which means that you went home the same day as your procedure, a responsible adult should be with you for the first 24 hours after you arrive home. °· Keep all follow-up visits as directed by your health care provider. This is important. °SEEK MEDICAL CARE IF: °· You have a fever. °· You have chills. °· You have increased bleeding from the radial site. Hold pressure on the site. °SEEK IMMEDIATE MEDICAL CARE IF: °· You have unusual pain at the radial site. °· You have redness, warmth, or swelling at the radial site. °· You have drainage (other than a small amount of blood on the dressing) from the radial site. °· The radial site is bleeding, and the bleeding does not stop after 30 minutes of holding steady pressure on the site. °· Your arm or hand becomes pale, cool, tingly, or numb. °  °This information is not intended to replace advice given to you by your health care provider. Make sure you discuss any questions you have with your health care provider. °  °Document Released: 11/08/2010 Document Revised: 10/27/2014 Document Reviewed: 04/24/2014 °Elsevier Interactive Patient Education ©2016 [employer] ° °

## 2015-11-14 NOTE — Research (Signed)
[place] Study Informed Consent   Subject Name: Emily [name]  Subject met inclusion and exclusion criteria.  The informed consent form, study requirements and expectations were reviewed with the subject and questions and concerns were addressed prior to the signing of the consent form.  The subject verbalized understanding of the trial requirements.  The subject agreed to participate in the [place]  trial and signed the informed consent at 0910 on 11/14/2015.  The informed consent was obtained prior to performance of any protocol-specific procedures for the subject.  A copy of the signed informed consent was given to the subject and a copy was placed in the subject's medical record.  Blossom Hoops 11/14/2015, 10:15 AM

## 2015-11-15 ENCOUNTER — Encounter (HOSPITAL_COMMUNITY): Payer: Self-pay | Admitting: Cardiovascular Disease

## 2015-11-19 ENCOUNTER — Encounter: Payer: BLUE CROSS/BLUE SHIELD | Admitting: Family Medicine

## 2015-11-20 ENCOUNTER — Encounter: Payer: Self-pay | Admitting: Family Medicine

## 2015-11-22 ENCOUNTER — Encounter: Admission: RE | Payer: Self-pay | Source: Ambulatory Visit

## 2015-11-22 ENCOUNTER — Ambulatory Visit
Admission: RE | Admit: 2015-11-22 | Payer: BLUE CROSS/BLUE SHIELD | Source: Ambulatory Visit | Admitting: Cardiovascular Disease

## 2015-11-22 SURGERY — LEFT HEART CATH AND CORONARY ANGIOGRAPHY
Anesthesia: Moderate Sedation

## 2015-11-26 ENCOUNTER — Other Ambulatory Visit: Payer: Self-pay | Admitting: Family Medicine

## 2015-11-26 DIAGNOSIS — E038 Other specified hypothyroidism: Secondary | ICD-10-CM

## 2015-11-26 NOTE — Telephone Encounter (Signed)
Please let her know that her dose changed and that she needs to come in for a blood test in 6 weeks. Thanks!

## 2015-12-04 ENCOUNTER — Ambulatory Visit (INDEPENDENT_AMBULATORY_CARE_PROVIDER_SITE_OTHER): Payer: BLUE CROSS/BLUE SHIELD | Admitting: Nurse Practitioner

## 2015-12-04 ENCOUNTER — Encounter: Payer: Self-pay | Admitting: Nurse Practitioner

## 2015-12-04 VITALS — BP 120/80 | HR 70 | Ht 63.0 in | Wt 204.5 lb

## 2015-12-04 DIAGNOSIS — Z9889 Other specified postprocedural states: Secondary | ICD-10-CM

## 2015-12-04 DIAGNOSIS — R072 Precordial pain: Secondary | ICD-10-CM

## 2015-12-04 DIAGNOSIS — I10 Essential (primary) hypertension: Secondary | ICD-10-CM | POA: Diagnosis not present

## 2015-12-04 DIAGNOSIS — R0789 Other chest pain: Secondary | ICD-10-CM

## 2015-12-04 NOTE — Progress Notes (Signed)
Office Visit    Patient Name: Emily [name] Date of Encounter: 12/04/2015  Primary Care Provider:  Olevia Perches, DO Primary Cardiologist:  Concha Se, MD   Chief Complaint    55 year old female with history of chest pain and fatigue who presents for follow-up after recent catheterization.  Past Medical History    Past Medical History  Diagnosis Date  . Essential hypertension   . Depression   . Anxiety   . Myalgia   . Hypothyroidism   . GERD (gastroesophageal reflux disease)   . Chest pain     a. 10/2013 Neg ETT; b. 10/2015 Refused MV->Cath: nl cors, nl EF.  . Morbid obesity [facility])    Past Surgical History  Procedure Laterality Date  . Cholecystectomy    . Knee surgery Right   . Tubal ligation    . Carpal tunnel release Left 07/03/2015    Procedure: CARPAL TUNNEL RELEASE;  Surgeon: Myra Rude, MD;  Location: [facility] ORS;  Service: Orthopedics;  Laterality: Left;  . Carpal tunnel release Right 09/07/2015    Procedure: CARPAL TUNNEL RELEASE;  Surgeon: Myra Rude, MD;  Location: [facility] ORS;  Service: Orthopedics;  Laterality: Right;  . Cardiac catheterization N/A 11/14/2015    Procedure: Left Heart Cath and Coronary Angiography;  Surgeon: Iran Ouch, MD;  Location: [facility] INVASIVE [facility];  Service: Cardiovascular;  Laterality: N/A;    Allergies  No Known Allergies  History of Present Illness    55 year old female with the above past medical history. She was last seen in January at which time she complained of dyspnea, chest pain, and fatigue. Stress testing was discussed, however she preferred diagnostic catheterization. This was subsequently performed and revealed normal coronary arteries and normal LV function. Since then, she has not had any additional chest pain. She continues to note some dyspnea on exertion, which she is now attributed to deconditioning. Her biggest concern today is her weight gain over the past few years. She admits to being sedentary in the  setting of having to take care of her mother and then father and simply not having time to exercise. She does want to lose weight and feels motivated to do so.  Home Medications    Prior to Admission medications   Medication Sig Start Date End Date Taking? Authorizing Provider  cholecalciferol (VITAMIN D) 1000 UNITS tablet Take 1,000 Units by mouth daily.   Yes Historical Provider, MD  levothyroxine (SYNTHROID, LEVOTHROID) 88 MCG tablet Take 1 tablet (88 mcg total) by mouth daily. 11/26/15  Yes Megan P Johnson, DO  losartan (COZAAR) 100 MG tablet TAKE 1 TABLET BY MOUTH EVERY DAY 11/05/15  Yes Megan P Johnson, DO  Naproxen Sodium (ALEVE) 220 MG CAPS Take 1 capsule by mouth as needed.   Yes Historical Provider, MD  Nutritional Supplements (JUICE PLUS FIBRE PO) Take by mouth daily.   Yes Historical Provider, MD  Omega-3 Fatty Acids (FISH OIL PO) Take 1 tablet by mouth daily.   Yes Historical Provider, MD  venlafaxine XR (EFFEXOR-XR) 150 MG 24 hr capsule TAKE 1 CAPSULE(150 MG) BY MOUTH DAILY WITH BREAKFAST 10/25/15  Yes Megan P Johnson, DO  meloxicam (MOBIC) 7.5 MG tablet Take 7.5 mg by mouth daily. Reported on 12/04/2015 06/17/15   Historical Provider, MD    Review of Systems    She continues to report fatigue and dyspnea on exertion in the setting of deconditioning. She has not had any more chest pain.  All other systems reviewed and  are otherwise negative except as noted above.  Physical Exam    VS:  BP 120/80 mmHg  Pulse 70  Ht  (1.6 m)  Wt 204 lb 8 oz (92.761 kg)  BMI 36.23 kg/m2 , BMI Body mass index is 36.23 kg/(m^2). GEN: Well nourished, well developed, in no acute distress. HEENT: normal. Neck: Supple, no JVD, carotid bruits, or masses. Cardiac: RRR, no murmurs, rubs, or gallops. No clubbing, cyanosis, edema.  Radials/DP/PT 2+ and equal bilaterally. Right wrist catheterization site without bleeding, bruit, or hematoma. Respiratory:  Respirations regular and unlabored, clear to  auscultation bilaterally. GI: Soft, nontender, nondistended, BS + x 4. MS: no deformity or atrophy. Skin: warm and dry, no rash. Neuro:  Strength and sensation are intact. Psych: Normal affect.  Accessory Clinical Findings    ECG - regular sinus rhythm, 70, no acute ST or T changes.  Assessment & Plan    1.  Midsternal chest pain: Status post recent catheterization revealing normal coronary arteries and normal LV function. She has not had any recurrence of chest pain. No further cardiac evaluation is warranted.  2. Fatigue and dyspnea on exertion: I suspect this is secondary to sedentary lifestyle and deconditioning. She had normal LV function by ventriculogram on recent catheterization. She has no evidence of volume overload. I would also question if depression is playing a role in the setting of fairly significant caregiver stress over the past 6 months. TSH was mildly elevated recently but free T4 was normal. She is on Synthroid.  3. Morbid obesity: Patient is concerned about her weight gain and is motivated to lose weight. We had a long discussion today about caloric intake and activity. She is very sedentary and deconditioned. I have recommended that she consider using a calorie tracking application on her phone such as 'my fitness pal' to assist her in setting reasonable weight loss goals and also to increase her awareness of how many calories are in the foods that she is eating and how this can be offset by activity.  4. Essential hypertension: This is stable on Cozaar.   5. Disposition: Follow-up when necessary.   Nicolasa Ducking, NP 12/04/2015, 4:37 PM

## 2015-12-04 NOTE — Patient Instructions (Signed)
Medication Instructions:  Please continue your current medications  Labwork: None  Testing/Procedures: None  Follow-Up: Call or return to clinic prn if these symptoms worsen or fail to improve as anticipated.  If you need a refill on your cardiac medications before your next appointment, please call your pharmacy.   

## 2015-12-12 ENCOUNTER — Other Ambulatory Visit: Payer: Self-pay | Admitting: Family Medicine

## 2016-01-31 DIAGNOSIS — M7062 Trochanteric bursitis, left hip: Secondary | ICD-10-CM | POA: Diagnosis not present

## 2016-01-31 DIAGNOSIS — M150 Primary generalized (osteo)arthritis: Secondary | ICD-10-CM | POA: Diagnosis not present

## 2016-02-05 DIAGNOSIS — G5602 Carpal tunnel syndrome, left upper limb: Secondary | ICD-10-CM | POA: Diagnosis not present

## 2016-02-05 DIAGNOSIS — G5601 Carpal tunnel syndrome, right upper limb: Secondary | ICD-10-CM | POA: Diagnosis not present

## 2016-02-07 ENCOUNTER — Other Ambulatory Visit: Payer: Self-pay | Admitting: Family Medicine

## 2016-02-07 NOTE — Telephone Encounter (Signed)
Called patient, left a message for patient to come in for lab work.

## 2016-02-07 NOTE — Telephone Encounter (Signed)
Needs to come in for blood work. (Lab only OK)

## 2016-02-07 NOTE — Telephone Encounter (Signed)
Patient is scheduled to come in for blood work tomorrow, patient is out of her thyroid medication

## 2016-02-08 ENCOUNTER — Other Ambulatory Visit: Payer: BLUE CROSS/BLUE SHIELD

## 2016-02-08 DIAGNOSIS — E038 Other specified hypothyroidism: Secondary | ICD-10-CM | POA: Diagnosis not present

## 2016-02-09 LAB — THYROID PANEL WITH TSH
Free Thyroxine Index: 2.1 (ref 1.2–4.9)
T3 UPTAKE RATIO: 29 % (ref 24–39)
T4, Total: 7.3 ug/dL (ref 4.5–12.0)
TSH: 3.37 u[IU]/mL (ref 0.450–4.500)

## 2016-02-11 ENCOUNTER — Telehealth: Payer: Self-pay | Admitting: Family Medicine

## 2016-02-11 ENCOUNTER — Ambulatory Visit: Payer: BLUE CROSS/BLUE SHIELD | Admitting: Occupational Therapy

## 2016-02-11 MED ORDER — LEVOTHYROXINE SODIUM 88 MCG PO TABS: 88.0000 ug | ORAL_TABLET | Freq: Every day | ORAL | Status: DC

## 2016-02-11 NOTE — Telephone Encounter (Signed)
Please let her know that her thyroid came back in the normal range and I've sent a year supply to her pharmacy.

## 2016-02-11 NOTE — Telephone Encounter (Signed)
Voicemail left to notify patient of lab results.

## 2016-02-18 ENCOUNTER — Ambulatory Visit: Payer: BLUE CROSS/BLUE SHIELD | Admitting: Occupational Therapy

## 2016-02-18 ENCOUNTER — Ambulatory Visit (INDEPENDENT_AMBULATORY_CARE_PROVIDER_SITE_OTHER): Payer: BLUE CROSS/BLUE SHIELD | Admitting: Family Medicine

## 2016-02-18 ENCOUNTER — Encounter: Payer: Self-pay | Admitting: Family Medicine

## 2016-02-18 VITALS — BP 144/84 | HR 72 | Temp 99.1°F | Ht 63.7 in | Wt 195.0 lb

## 2016-02-18 DIAGNOSIS — I10 Essential (primary) hypertension: Secondary | ICD-10-CM | POA: Diagnosis not present

## 2016-02-18 DIAGNOSIS — F32A Depression, unspecified: Secondary | ICD-10-CM

## 2016-02-18 DIAGNOSIS — Z1231 Encounter for screening mammogram for malignant neoplasm of breast: Secondary | ICD-10-CM | POA: Diagnosis not present

## 2016-02-18 DIAGNOSIS — F329 Major depressive disorder, single episode, unspecified: Secondary | ICD-10-CM | POA: Diagnosis not present

## 2016-02-18 MED ORDER — VENLAFAXINE HCL ER 75 MG PO CP24: 75.0000 mg | ORAL_CAPSULE | Freq: Every day | ORAL | Status: DC

## 2016-02-18 MED ORDER — LEVOTHYROXINE SODIUM 88 MCG PO TABS: 88.0000 ug | ORAL_TABLET | Freq: Every day | ORAL | Status: DC

## 2016-02-18 MED ORDER — LOSARTAN POTASSIUM 50 MG PO TABS: 100.0000 mg | ORAL_TABLET | Freq: Every day | ORAL | Status: DC

## 2016-02-18 MED ORDER — VENLAFAXINE HCL ER 150 MG PO CP24: ORAL_CAPSULE | ORAL | Status: DC

## 2016-02-18 MED ORDER — LOSARTAN POTASSIUM 100 MG PO TABS: 100.0000 mg | ORAL_TABLET | Freq: Every day | ORAL | Status: DC

## 2016-02-18 NOTE — Assessment & Plan Note (Signed)
Has been out of her medicine. [facility] Refill for 1 month sent to her pharmacy. No change in dose. Call with any concerns. Recheck 1 month.

## 2016-02-18 NOTE — Assessment & Plan Note (Signed)
Not under good control. Will increase her effexor and recheck and in 1 month. If not doing better, will consider adding wellbutrin. Call with any concerns. Continue to monitor closely.

## 2016-02-18 NOTE — Patient Instructions (Signed)
Health Maintenance, Female Adopting a healthy lifestyle and getting preventive care can go a long way to promote health and wellness. Talk with your health care provider about what schedule of regular examinations is right for you. This is a good chance for you to check in with your provider about disease prevention and staying healthy. In between checkups, there are plenty of things you can do on your own. Experts have done a lot of research about which lifestyle changes and preventive measures are most likely to keep you healthy. Ask your health care provider for more information. WEIGHT AND DIET  Eat a healthy diet  Be sure to include plenty of vegetables, fruits, low-fat dairy products, and lean protein.  Do not eat a lot of foods high in solid fats, added sugars, or salt.  Get regular exercise. This is one of the most important things you can do for your health.  Most adults should exercise for at least 150 minutes each week. The exercise should increase your heart rate and make you sweat (moderate-intensity exercise).  Most adults should also do strengthening exercises at least twice a week. This is in addition to the moderate-intensity exercise.  Maintain a healthy weight  Body mass index (BMI) is a measurement that can be used to identify possible weight problems. It estimates body fat based on height and weight. Your health care provider can help determine your BMI and help you achieve or maintain a healthy weight.  For females 20 years of age and older:   A BMI below 18.5 is considered underweight.  A BMI of 18.5 to 24.9 is normal.  A BMI of 25 to 29.9 is considered overweight.  A BMI of 30 and above is considered obese.  Watch levels of cholesterol and blood lipids  You should start having your blood tested for lipids and cholesterol at 57 years of age, then have this test every 5 years.  You may need to have your cholesterol levels checked more often if:  Your lipid  or cholesterol levels are high.  You are older than 57 years of age.  You are at high risk for heart disease.  CANCER SCREENING   Lung Cancer  Lung cancer screening is recommended for adults 55-80 years old who are at high risk for lung cancer because of a history of smoking.  A yearly low-dose CT scan of the lungs is recommended for people who:  Currently smoke.  Have quit within the past 15 years.  Have at least a 30-pack-year history of smoking. A pack year is smoking an average of one pack of cigarettes a day for 1 year.  Yearly screening should continue until it has been 15 years since you quit.  Yearly screening should stop if you develop a health problem that would prevent you from having lung cancer treatment.  Breast Cancer  Practice breast self-awareness. This means understanding how your breasts normally appear and feel.  It also means doing regular breast self-exams. Let your health care provider know about any changes, no matter how small.  If you are in your 20s or 30s, you should have a clinical breast exam (CBE) by a health care provider every 1-3 years as part of a regular health exam.  If you are 40 or older, have a CBE every year. Also consider having a breast X-ray (mammogram) every year.  If you have a family history of breast cancer, talk to your health care provider about genetic screening.  If you   are at high risk for breast cancer, talk to your health care provider about having an MRI and a mammogram every year.  Breast cancer gene (BRCA) assessment is recommended for women who have family members with BRCA-related cancers. BRCA-related cancers include:  Breast.  Ovarian.  Tubal.  Peritoneal cancers.  Results of the assessment will determine the need for genetic counseling and BRCA1 and BRCA2 testing. Cervical Cancer Your health care provider may recommend that you be screened regularly for cancer of the pelvic organs (ovaries, uterus, and  vagina). This screening involves a pelvic examination, including checking for microscopic changes to the surface of your cervix (Pap test). You may be encouraged to have this screening done every 3 years, beginning at age 21.  For women ages 30-65, health care providers may recommend pelvic exams and Pap testing every 3 years, or they may recommend the Pap and pelvic exam, combined with testing for human papilloma virus (HPV), every 5 years. Some types of HPV increase your risk of cervical cancer. Testing for HPV may also be done on women of any age with unclear Pap test results.  Other health care providers may not recommend any screening for nonpregnant women who are considered low risk for pelvic cancer and who do not have symptoms. Ask your health care provider if a screening pelvic exam is right for you.  If you have had past treatment for cervical cancer or a condition that could lead to cancer, you need Pap tests and screening for cancer for at least 20 years after your treatment. If Pap tests have been discontinued, your risk factors (such as having a new sexual partner) need to be reassessed to determine if screening should resume. Some women have medical problems that increase the chance of getting cervical cancer. In these cases, your health care provider may recommend more frequent screening and Pap tests. Colorectal Cancer  This type of cancer can be detected and often prevented.  Routine colorectal cancer screening usually begins at 57 years of age and continues through 57 years of age.  Your health care provider may recommend screening at an earlier age if you have risk factors for colon cancer.  Your health care provider may also recommend using home test kits to check for hidden blood in the stool.  A small camera at the end of a tube can be used to examine your colon directly (sigmoidoscopy or colonoscopy). This is done to check for the earliest forms of colorectal  cancer.  Routine screening usually begins at age 50.  Direct examination of the colon should be repeated every 5-10 years through 57 years of age. However, you may need to be screened more often if early forms of precancerous polyps or small growths are found. Skin Cancer  Check your skin from head to toe regularly.  Tell your health care provider about any new moles or changes in moles, especially if there is a change in a mole's shape or color.  Also tell your health care provider if you have a mole that is larger than the size of a pencil eraser.  Always use sunscreen. Apply sunscreen liberally and repeatedly throughout the day.  Protect yourself by wearing long sleeves, pants, a wide-brimmed hat, and sunglasses whenever you are outside. HEART DISEASE, DIABETES, AND HIGH BLOOD PRESSURE   High blood pressure causes heart disease and increases the risk of stroke. High blood pressure is more likely to develop in:  People who have blood pressure in the high end   of the normal range (130-139/85-89 mm Hg).  People who are overweight or obese.  People who are African American.  If you are 38-23 years of age, have your blood pressure checked every 3-5 years. If you are 61 years of age or older, have your blood pressure checked every year. You should have your blood pressure measured twice--once when you are at a hospital or clinic, and once when you are not at a hospital or clinic. Record the average of the two measurements. To check your blood pressure when you are not at a hospital or clinic, you can use:  An automated blood pressure machine at a pharmacy.  A home blood pressure monitor.  If you are between 45 years and 39 years old, ask your health care provider if you should take aspirin to prevent strokes.  Have regular diabetes screenings. This involves taking a blood sample to check your fasting blood sugar level.  If you are at a normal weight and have a low risk for diabetes,  have this test once every three years after 57 years of age.  If you are overweight and have a high risk for diabetes, consider being tested at a younger age or more often. PREVENTING INFECTION  Hepatitis B  If you have a higher risk for hepatitis B, you should be screened for this virus. You are considered at high risk for hepatitis B if:  You were born in a country where hepatitis B is common. Ask your health care provider which countries are considered high risk.  Your parents were born in a high-risk country, and you have not been immunized against hepatitis B (hepatitis B vaccine).  You have HIV or AIDS.  You use needles to inject street drugs.  You live with someone who has hepatitis B.  You have had sex with someone who has hepatitis B.  You get hemodialysis treatment.  You take certain medicines for conditions, including cancer, organ transplantation, and autoimmune conditions. Hepatitis C  Blood testing is recommended for:  Everyone born from [birth year] through 1967.  Anyone with known risk factors for hepatitis C. Sexually transmitted infections (STIs)  You should be screened for sexually transmitted infections (STIs) including gonorrhea and chlamydia if:  You are sexually active and are younger than 57 years of age.  You are older than 57 years of age and your health care provider tells you that you are at risk for this type of infection.  Your sexual activity has changed since you were last screened and you are at an increased risk for chlamydia or gonorrhea. Ask your health care provider if you are at risk.  If you do not have HIV, but are at risk, it may be recommended that you take a prescription medicine daily to prevent HIV infection. This is called pre-exposure prophylaxis (PrEP). You are considered at risk if:  You are sexually active and do not regularly use condoms or know the HIV status of your partner(s).  You take drugs by injection.  You are sexually  active with a partner who has HIV. Talk with your health care provider about whether you are at high risk of being infected with HIV. If you choose to begin PrEP, you should first be tested for HIV. You should then be tested every 3 months for as long as you are taking PrEP.  PREGNANCY   If you are premenopausal and you may become pregnant, ask your health care provider about preconception counseling.  If you may  PrEP). You are considered at risk if:    You are sexually active and do not regularly use condoms or know the HIV status of your partner(s).    You take drugs by injection.    You are sexually  active with a partner who has HIV.  Talk with your health care provider about whether you are at high risk of being infected with HIV. If you choose to begin PrEP, you should first be tested for HIV. You should then be tested every 3 months for as long as you are taking PrEP.   PREGNANCY   · If you are premenopausal and you may become pregnant, ask your health care provider about preconception counseling.  · If you may become pregnant, take 400 to 800 micrograms (mcg) of folic acid every day.  · If you want to prevent pregnancy, talk to your health care provider about birth control (contraception).  OSTEOPOROSIS AND MENOPAUSE   · Osteoporosis is a disease in which the bones lose minerals and strength with aging. This can result in serious bone fractures. Your risk for osteoporosis can be identified using a bone density scan.  · If you are 65 years of age or older, or if you are at risk for osteoporosis and fractures, ask your health care provider if you should be screened.  · Ask your health care provider whether you should take a calcium or vitamin D supplement to lower your risk for osteoporosis.  · Menopause may have certain physical symptoms and risks.  · Hormone replacement therapy may reduce some of these symptoms and risks.  Talk to your health care provider about whether hormone replacement therapy is right for you.   HOME CARE INSTRUCTIONS   · Schedule regular health, dental, and eye exams.  · Stay current with your immunizations.    · Do not use any tobacco products including cigarettes, chewing tobacco, or electronic cigarettes.  · If you are pregnant, do not drink alcohol.  · If you are breastfeeding, limit how much and how often you drink alcohol.  · Limit alcohol intake to no more than 1 drink per day for nonpregnant women. One drink equals 12 ounces of beer, 5 ounces of wine, or 1½ ounces of hard liquor.  · Do not use street drugs.  · Do not share needles.  · Ask your health care provider for help if  you need support or information about quitting drugs.  · Tell your health care provider if you often feel depressed.  · Tell your health care provider if you have ever been abused or do not feel safe at home.     This information is not intended to replace advice given to you by your health care provider. Make sure you discuss any questions you have with your health care provider.     Document Released: 04/21/2011 Document Revised: 10/27/2014 Document Reviewed: 09/07/2013  [employer]vier Interactive Patient Education ©2016 [employer]  Menopause is a normal process in which your reproductive ability comes to an end. This process happens gradually over a span of months to years, usually between the ages of 48 and 55. Menopause is complete when you have missed 12 consecutive menstrual periods.  It is important to talk with your health care provider about some of the most common conditions that affect postmenopausal women, such as heart disease, cancer, and bone loss (osteoporosis). Adopting a healthy lifestyle and getting preventive care can help to promote your health and wellness. Those actions can also   lower your chances of developing some of these common conditions.  WHAT SHOULD I KNOW ABOUT MENOPAUSE?  During menopause, you may experience a number of symptoms, such as:  · Moderate-to-severe hot flashes.  · Night sweats.  · Decrease in sex drive.  · Mood swings.  · Headaches.  · Tiredness.  · Irritability.  · Memory problems.  · Insomnia.  Choosing to treat or not to treat menopausal changes is an individual decision that you make with your health care provider.  WHAT SHOULD I KNOW ABOUT HORMONE REPLACEMENT THERAPY AND SUPPLEMENTS?  Hormone therapy products are effective for treating symptoms that are associated with menopause, such as hot flashes and night sweats. Hormone replacement carries certain risks, especially as you become older. If you are thinking about using estrogen or estrogen with progestin treatments,  discuss the benefits and risks with your health care provider.  WHAT SHOULD I KNOW ABOUT HEART DISEASE AND STROKE?  Heart disease, heart attack, and stroke become more likely as you age. This may be due, in part, to the hormonal changes that your body experiences during menopause. These can affect how your body processes dietary fats, triglycerides, and cholesterol. Heart attack and stroke are both medical emergencies.  There are many things that you can do to help prevent heart disease and stroke:  · Have your blood pressure checked at least every 1-2 years. High blood pressure causes heart disease and increases the risk of stroke.  · If you are 55-79 years old, ask your health care provider if you should take aspirin to prevent a heart attack or a stroke.  · Do not use any tobacco products, including cigarettes, chewing tobacco, or electronic cigarettes. If you need help quitting, ask your health care provider.  · It is important to eat a healthy diet and maintain a healthy weight.    Be sure to include plenty of vegetables, fruits, low-fat dairy products, and lean protein.    Avoid eating foods that are high in solid fats, added sugars, or salt (sodium).  · Get regular exercise. This is one of the most important things that you can do for your health.    Try to exercise for at least 150 minutes each week. The type of exercise that you do should increase your heart rate and make you sweat. This is known as moderate-intensity exercise.    Try to do strengthening exercises at least twice each week. Do these in addition to the moderate-intensity exercise.  · Know your numbers. Ask your health care provider to check your cholesterol and your blood glucose. Continue to have your blood tested as directed by your health care provider.  WHAT SHOULD I KNOW ABOUT CANCER SCREENING?  There are several types of cancer. Take the following steps to reduce your risk and to catch any cancer development as early as  possible.  Breast Cancer  · Practice breast self-awareness.    This means understanding how your breasts normally appear and feel.    It also means doing regular breast self-exams. Let your health care provider know about any changes, no matter how small.  · If you are 40 or older, have a clinician do a breast exam (clinical breast exam or CBE) every year. Depending on your age, family history, and medical history, it may be recommended that you also have a yearly breast X-ray (mammogram).  · If you have a family history of breast cancer, talk with your health care provider about genetic screening.  ·   If you are at high risk for breast cancer, talk with your health care provider about having an MRI and a mammogram every year.  · Breast cancer (BRCA) gene test is recommended for women who have family members with BRCA-related cancers. Results of the assessment will determine the need for genetic counseling and BRCA1 and for BRCA2 testing. BRCA-related cancers include these types:    Breast. This occurs in males or females.    Ovarian.    Tubal. This may also be called fallopian tube cancer.    Cancer of the abdominal or pelvic lining (peritoneal cancer).    Prostate.    Pancreatic.  Cervical, Uterine, and Ovarian Cancer  Your health care provider may recommend that you be screened regularly for cancer of the pelvic organs. These include your ovaries, uterus, and vagina. This screening involves a pelvic exam, which includes checking for microscopic changes to the surface of your cervix (Pap test).  · For women ages 21-65, health care providers may recommend a pelvic exam and a Pap test every three years. For women ages 30-65, they may recommend the Pap test and pelvic exam, combined with testing for human papilloma virus (HPV), every five years. Some types of HPV increase your risk of cervical cancer. Testing for HPV may also be done on women of any age who have unclear Pap test results.  · Other health care providers  may not recommend any screening for nonpregnant women who are considered low risk for pelvic cancer and have no symptoms. Ask your health care provider if a screening pelvic exam is right for you.  · If you have had past treatment for cervical cancer or a condition that could lead to cancer, you need Pap tests and screening for cancer for at least 20 years after your treatment. If Pap tests have been discontinued for you, your risk factors (such as having a new sexual partner) need to be reassessed to determine if you should start having screenings again. Some women have medical problems that increase the chance of getting cervical cancer. In these cases, your health care provider may recommend that you have screening and Pap tests more often.  · If you have a family history of uterine cancer or ovarian cancer, talk with your health care provider about genetic screening.  · If you have vaginal bleeding after reaching menopause, tell your health care provider.  · There are currently no reliable tests available to screen for ovarian cancer.  Lung Cancer  Lung cancer screening is recommended for adults 55-80 years old who are at high risk for lung cancer because of a history of smoking. A yearly low-dose CT scan of the lungs is recommended if you:  · Currently smoke.  · Have a history of at least 30 pack-years of smoking and you currently smoke or have quit within the past 15 years. A pack-year is smoking an average of one pack of cigarettes per day for one year.  Yearly screening should:  · Continue until it has been 15 years since you quit.  · Stop if you develop a health problem that would prevent you from having lung cancer treatment.  Colorectal Cancer  · This type of cancer can be detected and can often be prevented.  · Routine colorectal cancer screening usually begins at age 50 and continues through age 75.  · If you have risk factors for colon cancer, your health care provider may recommend that you be  screened at an earlier age.  ·   If you have a family history of colorectal cancer, talk with your health care provider about genetic screening.  · Your health care provider may also recommend using home test kits to check for hidden blood in your stool.  · A small camera at the end of a tube can be used to examine your colon directly (sigmoidoscopy or colonoscopy). This is done to check for the earliest forms of colorectal cancer.  · Direct examination of the colon should be repeated every 5-10 years until age 75. However, if early forms of precancerous polyps or small growths are found or if you have a family history or genetic risk for colorectal cancer, you may need to be screened more often.  Skin Cancer  · Check your skin from head to toe regularly.  · Monitor any moles. Be sure to tell your health care provider:    About any new moles or changes in moles, especially if there is a change in a mole's shape or color.    If you have a mole that is larger than the size of a pencil eraser.  · If any of your family members has a history of skin cancer, especially at a young age, talk with your health care provider about genetic screening.  · Always use sunscreen. Apply sunscreen liberally and repeatedly throughout the day.  · Whenever you are outside, protect yourself by wearing long sleeves, pants, a wide-brimmed hat, and sunglasses.  WHAT SHOULD I KNOW ABOUT OSTEOPOROSIS?  Osteoporosis is a condition in which bone destruction happens more quickly than new bone creation. After menopause, you may be at an increased risk for osteoporosis. To help prevent osteoporosis or the bone fractures that can happen because of osteoporosis, the following is recommended:  · If you are 19-50 years old, get at least 1,000 mg of calcium and at least 600 mg of vitamin D per day.  · If you are older than age 50 but younger than age 70, get at least 1,200 mg of calcium and at least 600 mg of vitamin D per day.  · If you are older than  age 70, get at least 1,200 mg of calcium and at least 800 mg of vitamin D per day.  Smoking and excessive alcohol intake increase the risk of osteoporosis. Eat foods that are rich in calcium and vitamin D, and do weight-bearing exercises several times each week as directed by your health care provider.  WHAT SHOULD I KNOW ABOUT HOW MENOPAUSE AFFECTS MY MENTAL HEALTH?  Depression may occur at any age, but it is more common as you become older. Common symptoms of depression include:  · Low or sad mood.  · Changes in sleep patterns.  · Changes in appetite or eating patterns.  · Feeling an overall lack of motivation or enjoyment of activities that you previously enjoyed.  · Frequent crying spells.  Talk with your health care provider if you think that you are experiencing depression.  WHAT SHOULD I KNOW ABOUT IMMUNIZATIONS?  It is important that you get and maintain your immunizations. These include:  · Tetanus, diphtheria, and pertussis (Tdap) booster vaccine.  · Influenza every year before the flu season begins.  · Pneumonia vaccine.  · Shingles vaccine.  Your health care provider may also recommend other immunizations.     This information is not intended to replace advice given to you by your health care provider. Make sure you discuss any questions you have with your health care provider.

## 2016-02-18 NOTE — Progress Notes (Signed)
BP 144/84 mmHg  Pulse 72  Temp(Src) 99.1 F (37.3 C)  Ht 5' 3.7" (1.618 m)  Wt 195 lb (88.451 kg)  BMI 33.79 kg/m2  SpO2 95%   Subjective:    Patient ID: Emily [name], female    DOB: 11/23/1960, 55 y.o.   MRN: [id number]  HPI: Emily [name] is a 55 y.o. female  Chief Complaint  Patient presents with  . Depression    she feels like she is just going thru the motions.    DEPRESSION Mood status: exacerbated Satisfied with current treatment?: no Symptom severity: moderate  Duration of current treatment : chronic Side effects: no Medication compliance: excellent compliance Psychotherapy/counseling: no  Previous psychiatric medications: effexor Depressed mood: yes Anxious mood: yes Anhedonia: yes Significant weight loss or gain: yes- with effort Insomnia: yes hard to fall asleep Fatigue: yes Feelings of worthlessness or guilt: yes Impaired concentration/indecisiveness: yes Suicidal ideations: no Hopelessness: no Crying spells: no Depression screen PHQ 2/9 02/18/2016  Decreased Interest 3  Down, Depressed, Hopeless 3  PHQ - 2 Score 6  Altered sleeping 3  Tired, decreased energy 3  Change in appetite 1  Feeling bad or failure about yourself  3  Trouble concentrating 3  Moving slowly or fidgety/restless 0  Suicidal thoughts 0  PHQ-9 Score 19  Difficult doing work/chores Very difficult   HYPERTENSION- ran out of her blood pressure medicine and didn't take it today Hypertension status: uncontrolled  Satisfied with current treatment? yes Duration of hypertension: chronic BP monitoring frequency:  not checking BP medication side effects:  no Medication compliance: good compliance Aspirin: no Recurrent headaches: no Visual changes: no Palpitations: no Dyspnea: no Chest pain: no Lower extremity edema: no Dizzy/lightheaded: no  Relevant past medical, surgical, family and social history reviewed and updated as indicated. Interim medical history since our last visit  reviewed. Allergies and medications reviewed and updated.  Review of Systems  Constitutional: Negative.   Respiratory: Negative.   Cardiovascular: Negative.   Psychiatric/Behavioral: Positive for confusion, sleep disturbance, dysphoric mood and decreased concentration. Negative for suicidal ideas, hallucinations, behavioral problems, self-injury and agitation. The patient is nervous/anxious. The patient is not hyperactive.     Per HPI unless specifically indicated above     Objective:    BP 144/84 mmHg  Pulse 72  Temp(Src) 99.1 F (37.3 C)  Ht 5' 3.7" (1.618 m)  Wt 195 lb (88.451 kg)  BMI 33.79 kg/m2  SpO2 95%  Wt Readings from Last 3 Encounters:  02/18/16 195 lb (88.451 kg)  12/04/15 204 lb 8 oz (92.761 kg)  11/14/15 200 lb (90.719 kg)    Physical Exam  Constitutional: She is oriented to person, place, and time. She appears well-developed and well-nourished. No distress.  HENT:  Head: Normocephalic and atraumatic.  Right Ear: Hearing normal.  Left Ear: Hearing normal.  Nose: Nose normal.  Eyes: Conjunctivae and lids are normal. Right eye exhibits no discharge. Left eye exhibits no discharge. No scleral icterus.  Cardiovascular: Normal rate, regular rhythm, normal heart sounds and intact distal pulses.  Exam reveals no gallop and no friction rub.   No murmur heard. Pulmonary/Chest: Effort normal and breath sounds normal. No respiratory distress. She has no wheezes. She has no rales. She exhibits no tenderness.  Musculoskeletal: Normal range of motion.  Neurological: She is alert and oriented to person, place, and time.  Skin: Skin is warm, dry and intact. No rash noted.  Psychiatric: Her speech is normal. Judgment and thought content normal.  She is slowed. Cognition and memory are normal. She exhibits a depressed mood.  Nursing note and vitals reviewed.   Results for orders placed or performed in visit on 02/08/16  Thyroid Panel With TSH  Result Value Ref Range   TSH  3.370 0.450 - 4.500 uIU/mL   T4, Total 7.3 4.5 - 12.0 ug/dL   T3 Uptake Ratio 29 24 - 39 %   Free Thyroxine Index 2.1 1.2 - 4.9      Assessment & Plan:   Problem List Items Addressed This Visit      Cardiovascular and Mediastinum   Hypertension    Has been out of her medicine. [facility] Refill for 1 month sent to her pharmacy. No change in dose. Call with any concerns. Recheck 1 month.       Relevant Medications   losartan (COZAAR) 100 MG tablet     Other   Depression    Not under good control. Will increase her effexor and recheck and in 1 month. If not doing better, will consider adding wellbutrin. Call with any concerns. Continue to monitor closely.      Relevant Medications   venlafaxine XR (EFFEXOR-XR) 150 MG 24 hr capsule   venlafaxine XR (EFFEXOR-XR) 75 MG 24 hr capsule    Other Visit Diagnoses    Visit for screening mammogram    -  Primary    Ordered today.    Relevant Orders    MM SCREENING BREAST TOMO BILATERAL        Follow up plan: Return in about 4 weeks (around 03/17/2016) for recheck mood and BP.

## 2016-02-21 ENCOUNTER — Ambulatory Visit: Payer: BLUE CROSS/BLUE SHIELD | Attending: Specialist | Admitting: Occupational Therapy

## 2016-02-21 ENCOUNTER — Other Ambulatory Visit: Payer: Self-pay | Admitting: Family Medicine

## 2016-02-21 DIAGNOSIS — M6281 Muscle weakness (generalized): Secondary | ICD-10-CM

## 2016-02-21 DIAGNOSIS — M25632 Stiffness of left wrist, not elsewhere classified: Secondary | ICD-10-CM | POA: Diagnosis not present

## 2016-02-21 NOTE — Patient Instructions (Signed)
Pt to do heat  Prayer stretch for wrist extention  1 lbs for wrist flexion and extention  or 16oz hammer for wrist UD and RD , sup/pro 10 reps  2 x day   Teal putty for grip , lat ad 3 point as well as pulling with all digits 10 reps  X 2 day  Increase to 2 sets by Sunday if not increase pain

## 2016-02-21 NOTE — Therapy (Signed)
[place] [facility] [address] [place], [address] Phone: [phone]   Fax:  [phone]  Occupational Therapy Treatment  Patient Details  Name: Emily [name] MRN: [id number] Date of Birth: 30-May-1961 Referring Provider: Hyacinth Meeker   Encounter Date: 02/21/2016      OT End of Session - 02/21/16 1804    Visit Number 1   Number of Visits 8   Date for OT Re-Evaluation 03/20/16   OT Start Time 1640   OT Stop Time 1744   OT Time Calculation (min) 64 min   Activity Tolerance Patient tolerated treatment well   Behavior During Therapy [facility] for tasks assessed/performed      Past Medical History  Diagnosis Date  . Essential hypertension   . Depression   . Anxiety   . Myalgia   . Hypothyroidism   . GERD (gastroesophageal reflux disease)   . Chest pain     a. 10/2013 Neg ETT; b. 10/2015 Refused MV->Cath: nl cors, nl EF.  . Morbid obesity [facility])     Past Surgical History  Procedure Laterality Date  . Cholecystectomy    . Knee surgery Right   . Tubal ligation    . Carpal tunnel release Left 07/03/2015    Procedure: CARPAL TUNNEL RELEASE;  Surgeon: Myra Rude, MD;  Location: [facility] ORS;  Service: Orthopedics;  Laterality: Left;  . Carpal tunnel release Right 09/07/2015    Procedure: CARPAL TUNNEL RELEASE;  Surgeon: Myra Rude, MD;  Location: [facility] ORS;  Service: Orthopedics;  Laterality: Right;  . Cardiac catheterization N/A 11/14/2015    Procedure: Left Heart Cath and Coronary Angiography;  Surgeon: Iran Ouch, MD;  [facility] INV[facility];  Service: Cardiovascular;  Laterality: N/A;    There were no vitals filed for this visit.      Subjective Assessment - 02/21/16 1756    Subjective  My hands are just weak since CT surgery - cannot pick up anything with weight , milk jug, open jars, cut food or peel potatoes - no pain    Patient Stated Goals If I can get my strenght in my hands back to do yard  work , Financial risk analyst, cut stuff, pick up objects with some weight    Currently in Pain? No/denies            [facility] OT Assessment - 02/21/16 0001    Assessment   Diagnosis Bilateral CTR   Referring Provider Hyacinth Meeker    Onset Date 06/21/15   Home  Environment   Lives With Spouse   Prior Function   Level of Independence Independent   Vocation Full time employment   Leisure Pt is R hand dominant - work for more than 30 yrs at [employer] - likes to  do yard work , do own house work , use cell phone , crafts    AROM   Overall AROM Comments Bilateral hands digits AROM including thumbs WNL    Right Wrist Extension 45 Degrees   Right Wrist Flexion 75 Degrees   Left Wrist Extension 55 Degrees   Left Wrist Flexion 85 Degrees   Strength   Right Hand Grip (lbs) 15   Right Hand Lateral Pinch 11 lbs   Right Hand 3 Point Pinch 8 lbs   Left Hand Grip (lbs) 25   Left Hand Lateral Pinch 10 lbs   Left Hand 3 Point Pinch 8 lbs      Pt ed on HEP and hand  out provided - reviewed and see pt  instruction                    OT Education - 02/21/16 1804    Education provided Yes   Education Details HEP   Person(s) Educated Patient   Methods Explanation;Demonstration;Tactile cues;Verbal cues;Handout   Comprehension Verbal cues required;Returned demonstration;Verbalized understanding          OT Short Term Goals - 02/21/16 1808    OT SHORT TERM GOAL #1   Title Pt to be ind in HEP to increase grip in bilateral hands by 10 lbs to report able to carry 10 lbs with more ease ad lift ojbects    Baseline Grip 15 R , L 25    Time 3   Period Weeks   Status New           OT Long Term Goals - 02/21/16 1809    OT LONG TERM GOAL #1   Title Function score on PRWHE improve with  10 points    Baseline Score on PRWHE for function 22.5/50   Time 4   Period Weeks   Status New               Plan - 02/21/16 1805    Clinical Impression Statement Pt present S/P CTR on L Sept last  year and R Nov last year - pt refer with hand weakness , no pain and report some pins and needles still in finger tips - but better than prior to surgery - pt grip and prehension is impaired and not WFL for her age - pt has increase difficutly using hands - thumb PA and RA  AROM WNL but decrease strength as well as wrist  in all planes -   Rehab Potential Good   OT Frequency 2x / week   OT Duration 4 weeks   OT Treatment/Interventions Self-care/ADL training;Parrafin;DME and/or AE instruction;Patient/family education;Therapeutic exercises;Scar mobilization;Passive range of motion;Manual Therapy   Plan assess grip and prehension - update HEP    OT Home Exercise Plan see pt instruction    Consulted and Agree with Plan of Care Patient      Patient will benefit from skilled therapeutic intervention in order to improve the following deficits and impairments:  Decreased range of motion, Impaired flexibility, Impaired UE functional use, Decreased strength  Visit Diagnosis: Muscle weakness (generalized) - Plan: Ot plan of care cert/re-cert  Stiffness of left wrist, not elsewhere classified - Plan: Ot plan of care cert/re-cert    Problem List Patient Active Problem List   Diagnosis Date Noted  . Unstable angina (HCC)   . Carpal tunnel syndrome, bilateral 06/22/2015  . Other specified hypothyroidism 06/22/2015  . Caregiver stress 05/18/2015  . Depression 04/12/2015  . Acute anxiety 04/12/2015  . Hypertension 04/12/2015  . Myalgia 04/12/2015  . Arthritis 04/12/2015  . Psoriasis 04/12/2015  . SOB (shortness of breath) 11/01/2013  . Fatigue 11/01/2013  . Neck pain 11/01/2013    Oletta CohnuPreez, Wynter Grave OTR/L,CLT 02/21/2016, 6:33 PM  [facility] [address] [place], [place], [address] Phone: [phone]   Fax:  [phone]  Name: Emily [name] MRN: [id number] Date of Birth: 1961-06-22

## 2016-02-27 ENCOUNTER — Ambulatory Visit: Payer: BLUE CROSS/BLUE SHIELD | Admitting: Occupational Therapy

## 2016-02-27 DIAGNOSIS — M6281 Muscle weakness (generalized): Secondary | ICD-10-CM

## 2016-02-27 DIAGNOSIS — M25632 Stiffness of left wrist, not elsewhere classified: Secondary | ICD-10-CM | POA: Diagnosis not present

## 2016-02-27 NOTE — Patient Instructions (Signed)
   Increase resistance to green firm  - 10 reps each hand  - also needed min v/c to do correctly  Pulling with all fingers and lat grip - pt was doing incorrect  3 point  All 10 reps  2 x day add to HEP   hand gripper setup with 10 lbs ( 2 yellow's) 10 reps  And then 5 x hold 10 sec  Add to HEP  And increase to 2 lbs for wrist in all planes 10 reps

## 2016-02-27 NOTE — Therapy (Signed)
[place] [facility] [address] [place], [address] Phone: [phone]   Fax:  [phone]  Occupational Therapy Treatment  Patient Details  Name: Emily [name] MRN: [id number] Date of Birth: 09/22/1961 Referring Provider: Hyacinth Meeker   Encounter Date: 02/27/2016      OT End of Session - 02/27/16 1457    Visit Number 2   Number of Visits 8   Date for OT Re-Evaluation 03/20/16   OT Start Time 1308   OT Stop Time 1340   OT Time Calculation (min) 32 min   Activity Tolerance Patient tolerated treatment well   Behavior During Therapy [facility] for tasks assessed/performed      Past Medical History  Diagnosis Date  . Essential hypertension   . Depression   . Anxiety   . Myalgia   . Hypothyroidism   . GERD (gastroesophageal reflux disease)   . Chest pain     a. 10/2013 Neg ETT; b. 10/2015 Refused MV->Cath: nl cors, nl EF.  . Morbid obesity [facility])     Past Surgical History  Procedure Laterality Date  . Cholecystectomy    . Knee surgery Right   . Tubal ligation    . Carpal tunnel release Left 07/03/2015    Procedure: CARPAL TUNNEL RELEASE;  Surgeon: Myra Rude, MD;  Location: [facility] ORS;  Service: Orthopedics;  Laterality: Left;  . Carpal tunnel release Right 09/07/2015    Procedure: CARPAL TUNNEL RELEASE;  Surgeon: Myra Rude, MD;  Location: [facility] ORS;  Service: Orthopedics;  Laterality: Right;  . Cardiac catheterization N/A 11/14/2015    Procedure: Left Heart Cath and Coronary Angiography;  Surgeon: Iran Ouch, MD;  [facility];  Service: Cardiovascular;  Laterality: N/A;    There were no vitals filed for this visit.      Subjective Assessment - 02/27/16 1452    Subjective  I can tell if my hands are better - I did the exercises about 2 x day - still little pins and needles in finger tips   Patient Stated Goals If I can get my strenght in my hands back to do yard work , cook, cut  stuff, pick up objects with some weight    Currently in Pain? No/denies            [facility] OT Assessment - 02/27/16 0001    Strength   Right Hand Grip (lbs) (p) 17   Right Hand 3 Point Pinch (p) 10 lbs   Left Hand Grip (lbs) (p) 25   Left Hand Lateral Pinch (p) 10 lbs   Left Hand 3 Point Pinch (p) 9 lbs         Measurements for grip and prehension - R grip , and bilateral 3 point increase - but no grip on L or lateral grip  See flowsheet   Reviewed with pt HEP -if doing correctly  Increase resistance to green firm  - 10 reps each hand  - also needed min v/c to do correctly  Pulling with all fingers and lat grip - pt was doing incorrect  3 point  All 10 reps  2 x day add to HEP   hand gripper setup with 10 lbs ( 2 yellow's) 10 reps  And then 5 x hold 10 sec  Add to HEP  And increase to 2 lbs for wrist in all planes 10 reps  OT Education - 02/27/16 1457    Education provided Yes   Education Details HEP   Person(s) Educated Patient;Spouse   Methods Explanation;Demonstration;Tactile cues;Verbal cues;Handout   Comprehension Verbal cues required;Returned demonstration;Verbalized understanding          OT Short Term Goals - 02/21/16 1808    OT SHORT TERM GOAL #1   Title Pt to be ind in HEP to increase grip in bilateral hands by 10 lbs to report able to carry 10 lbs with more ease ad lift ojbects    Baseline Grip 15 R , L 25    Time 3   Period Weeks   Status New           OT Long Term Goals - 02/21/16 1809    OT LONG TERM GOAL #1   Title Function score on PRWHE improve with  10 points    Baseline Score on PRWHE for function 22.5/50   Time 4   Period Weeks   Status New               Plan - 02/27/16 1458    Clinical Impression Statement Pt did not make as much progress as this OT would have expected - pt and husband present this date - did increase her putty resistance and add hand gripper - pt did needed some  assistance to do HEP correctly  - will reassess in week    Rehab Potential Good   OT Frequency 1x / week   OT Duration 4 weeks   OT Treatment/Interventions Self-care/ADL training;Parrafin;DME and/or AE instruction;Patient/family education;Therapeutic exercises;Scar mobilization;Passive range of motion;Manual Therapy   Plan assess grip and preheinsion - upgrade HEP    OT Home Exercise Plan see pt instruction    Consulted and Agree with Plan of Care Patient      Patient will benefit from skilled therapeutic intervention in order to improve the following deficits and impairments:  Decreased range of motion, Impaired flexibility, Impaired UE functional use, Decreased strength  Visit Diagnosis: Muscle weakness (generalized)  Stiffness of left wrist, not elsewhere classified    Problem List Patient Active Problem List   Diagnosis Date Noted  . Unstable angina (HCC)   . Carpal tunnel syndrome, bilateral 06/22/2015  . Other specified hypothyroidism 06/22/2015  . Caregiver stress 05/18/2015  . Depression 04/12/2015  . Acute anxiety 04/12/2015  . Hypertension 04/12/2015  . Myalgia 04/12/2015  . Arthritis 04/12/2015  . Psoriasis 04/12/2015  . SOB (shortness of breath) 11/01/2013  . Fatigue 11/01/2013  . Neck pain 11/01/2013    Oletta CohnuPreez, Dariush Mcnellis OTR/L,CLT 02/27/2016, 3:00 PM  [place] [facility] [address] [place], [place], [address] Phone: [phone]   Fax:  [phone]  Name: Emily [name] MRN: [id number] Date of Birth: 1961-03-25

## 2016-02-29 ENCOUNTER — Ambulatory Visit: Payer: BLUE CROSS/BLUE SHIELD | Admitting: Occupational Therapy

## 2016-03-05 ENCOUNTER — Ambulatory Visit: Payer: BLUE CROSS/BLUE SHIELD | Admitting: Occupational Therapy

## 2016-03-05 DIAGNOSIS — M25632 Stiffness of left wrist, not elsewhere classified: Secondary | ICD-10-CM

## 2016-03-05 DIAGNOSIS — M6281 Muscle weakness (generalized): Secondary | ICD-10-CM | POA: Diagnosis not present

## 2016-03-05 NOTE — Patient Instructions (Signed)
upgrade Increase resistance to green mix with dark blue firm -  2 x 15 reps  3 point and lat grip 2 x 10 reps   Pulling with all fingers and twisting - 2 x 15 reps    2 x day add to HEP - if no pain can increase reps or sets   hand gripper setup with 10 lbs and did bilateral hands  ( 2 yellow's)  10 reps  And then 5 x hold 10 sec Then upgrade to 15 lbs for R hand

## 2016-03-05 NOTE — Therapy (Signed)
[facility] [address] [place], [place], [address] Phone: [phone]   Fax:  [phone]  Occupational Therapy Treatment  Patient Details  Name: Emily [name] MRN: [id number] Date of Birth: 04-Dec-1960 Referring Provider: Hyacinth MeekerMiller   Encounter Date: 03/05/2016      OT End of Session - 03/05/16 1246    Visit Number 3   Number of Visits 8   Date for OT Re-Evaluation 03/20/16   OT Start Time 1226   OT Stop Time 1308   OT Time Calculation (min) 42 min   Activity Tolerance Patient tolerated treatment well   Behavior During Therapy [facility] for tasks assessed/performed      Past Medical History  Diagnosis Date  . Essential hypertension   . Depression   . Anxiety   . Myalgia   . Hypothyroidism   . GERD (gastroesophageal reflux disease)   . Chest pain     a. 10/2013 Neg ETT; b. 10/2015 Refused MV->Cath: nl cors, nl EF.  . Morbid obesity [facility](HCC)     Past Surgical History  Procedure Laterality Date  . Cholecystectomy    . Knee surgery Right   . Tubal ligation    . Carpal tunnel release Left 07/03/2015    Procedure: CARPAL TUNNEL RELEASE;  Surgeon: Myra Rudehristopher Smith, MD;  Location: [facility] ORS;  Service: Orthopedics;  Laterality: Left;  . Carpal tunnel release Right 09/07/2015    Procedure: CARPAL TUNNEL RELEASE;  Surgeon: Myra Rudehristopher Smith, MD;  Location: [facility] ORS;  Service: Orthopedics;  Laterality: Right;  . Cardiac catheterization N/A 11/14/2015    Procedure: Left Heart Cath and Coronary Angiography;  Surgeon: Iran OuchMuhammad A Arida, MD;  [facility];  Service: Cardiovascular;  Laterality: N/A;    There were no vitals filed for this visit.      Subjective Assessment - 03/05/16 1412    Subjective  I think I am getting stronger - but still dropping things - did cut the lawn    Patient Stated Goals If I can get my strenght in my hands back to do yard work , cook, cut stuff, pick up objects with some weight     Currently in Pain? No/denies            [facility] - 03/05/16 0001    Strength   Right Hand Grip (lbs) 35   Right Hand Lateral Pinch 10 lbs   Right Hand 3 Point Pinch 10 lbs   Left Hand Grip (lbs) 27   Left Hand Lateral Pinch 10 lbs   Left Hand 3 Point Pinch 9 lbs      Measurements for grip and prehension - R grip improved greatly   See flow sheet Pt forgot last time her hand out and went on memory   Reviewed with pt HEP And upgrade Increase resistance to green mix with dark blue firm -  2 x 15 reps  3 point and lat grip 2 x 10 reps   Pulling with all fingers and twisting - 2 x 15 reps    2 x day add to HEP - if no pain can increase reps or sets   hand gripper setup with 10 lbs and did bilateral hands  ( 2 yellow's)  10 reps  And then 5 x hold 10 sec Then upgrade to 15 lbs for R hand                   OT  Education - 03/05/16 1245    Education Details HEP    Person(s) Educated Patient   Methods Explanation;Tactile cues;Handout;Verbal cues;Demonstration   Comprehension Verbalized understanding;Returned demonstration;Verbal cues required          OT Short Term Goals - 03/05/16 1418    OT SHORT TERM GOAL #1   Title Pt to be ind in HEP to increase grip in bilateral hands by 10 lbs to report able to carry 10 lbs with more ease ad lift ojbects    Baseline R improve but not L yet   Time 3   Period Weeks   Status On-going           OT Long Term Goals - 03/05/16 1418    OT LONG TERM GOAL #1   Title Function score on PRWHE improve with  10 points    Baseline Score on PRWHE for function 22.5/50 still dropping    Time 4   Period Weeks   Status On-going               Plan - 03/05/16 1248    Clinical Impression Statement Pt made great progress in R hand grip but prehension and L grip still slowly improvement - pt did forgot her hand out last time - did increase her resistance on putty and gripper for R hand - and to start  HEP with L hand - pt to see MD early next week    Rehab Potential Good   OT Frequency 1x / week   OT Duration 4 weeks   OT Treatment/Interventions Self-care/ADL training;Parrafin;DME and/or AE instruction;Patient/family education;Therapeutic exercises;Scar mobilization;Passive range of motion;Manual Therapy   OT Home Exercise Plan see pt instruction    Consulted and Agree with Plan of Care Patient      Patient will benefit from skilled therapeutic intervention in order to improve the following deficits and impairments:  Decreased range of motion, Impaired flexibility, Impaired UE functional use, Decreased strength  Visit Diagnosis: Muscle weakness (generalized)  Stiffness of left wrist, not elsewhere classified    Problem List Patient Active Problem List   Diagnosis Date Noted  . Unstable angina (HCC)   . Carpal tunnel syndrome, bilateral 06/22/2015  . Other specified hypothyroidism 06/22/2015  . Caregiver stress 05/18/2015  . Depression 04/12/2015  . Acute anxiety 04/12/2015  . Hypertension 04/12/2015  . Myalgia 04/12/2015  . Arthritis 04/12/2015  . Psoriasis 04/12/2015  . SOB (shortness of breath) 11/01/2013  . Fatigue 11/01/2013  . Neck pain 11/01/2013    Oletta Cohn OTR/L,CLT  03/05/2016, 2:19 PM  [place] [facility]ICINE [address], [place], [address] Phone: [phone]   Fax:  [phone]  Name: Emily [name] MRN: [id number] Date of Birth: 1961-07-21

## 2016-03-11 DIAGNOSIS — G5601 Carpal tunnel syndrome, right upper limb: Secondary | ICD-10-CM | POA: Diagnosis not present

## 2016-03-11 DIAGNOSIS — G5602 Carpal tunnel syndrome, left upper limb: Secondary | ICD-10-CM | POA: Diagnosis not present

## 2016-03-13 ENCOUNTER — Ambulatory Visit: Payer: BLUE CROSS/BLUE SHIELD | Admitting: Occupational Therapy

## 2016-03-19 ENCOUNTER — Ambulatory Visit: Payer: BLUE CROSS/BLUE SHIELD | Admitting: Occupational Therapy

## 2016-03-25 ENCOUNTER — Encounter: Payer: BLUE CROSS/BLUE SHIELD | Admitting: Family Medicine

## 2016-03-27 ENCOUNTER — Ambulatory Visit: Payer: BLUE CROSS/BLUE SHIELD | Admitting: Occupational Therapy

## 2016-04-01 ENCOUNTER — Ambulatory Visit: Payer: BLUE CROSS/BLUE SHIELD | Attending: Specialist | Admitting: Occupational Therapy

## 2016-04-07 ENCOUNTER — Ambulatory Visit (INDEPENDENT_AMBULATORY_CARE_PROVIDER_SITE_OTHER): Payer: BLUE CROSS/BLUE SHIELD | Admitting: Family Medicine

## 2016-04-07 ENCOUNTER — Encounter: Payer: Self-pay | Admitting: Family Medicine

## 2016-04-07 VITALS — BP 140/82 | HR 77 | Temp 99.0°F | Ht 64.0 in | Wt 201.0 lb

## 2016-04-07 DIAGNOSIS — N390 Urinary tract infection, site not specified: Secondary | ICD-10-CM

## 2016-04-07 DIAGNOSIS — Z1159 Encounter for screening for other viral diseases: Secondary | ICD-10-CM

## 2016-04-07 DIAGNOSIS — Z1239 Encounter for other screening for malignant neoplasm of breast: Secondary | ICD-10-CM

## 2016-04-07 DIAGNOSIS — R8281 Pyuria: Secondary | ICD-10-CM

## 2016-04-07 DIAGNOSIS — E038 Other specified hypothyroidism: Secondary | ICD-10-CM

## 2016-04-07 DIAGNOSIS — M542 Cervicalgia: Secondary | ICD-10-CM

## 2016-04-07 DIAGNOSIS — Z124 Encounter for screening for malignant neoplasm of cervix: Secondary | ICD-10-CM

## 2016-04-07 DIAGNOSIS — F32A Depression, unspecified: Secondary | ICD-10-CM

## 2016-04-07 DIAGNOSIS — Z0000 Encounter for general adult medical examination without abnormal findings: Secondary | ICD-10-CM | POA: Diagnosis not present

## 2016-04-07 DIAGNOSIS — F329 Major depressive disorder, single episode, unspecified: Secondary | ICD-10-CM | POA: Diagnosis not present

## 2016-04-07 DIAGNOSIS — Z23 Encounter for immunization: Secondary | ICD-10-CM | POA: Diagnosis not present

## 2016-04-07 DIAGNOSIS — R5383 Other fatigue: Secondary | ICD-10-CM

## 2016-04-07 DIAGNOSIS — Z114 Encounter for screening for human immunodeficiency virus [HIV]: Secondary | ICD-10-CM | POA: Diagnosis not present

## 2016-04-07 DIAGNOSIS — I10 Essential (primary) hypertension: Secondary | ICD-10-CM | POA: Diagnosis not present

## 2016-04-07 MED ORDER — CYCLOBENZAPRINE HCL 10 MG PO TABS: 10.0000 mg | ORAL_TABLET | Freq: Every day | ORAL | Status: DC

## 2016-04-07 MED ORDER — BUPROPION HCL ER (SR) 150 MG PO TB12: ORAL_TABLET | ORAL | Status: DC

## 2016-04-07 NOTE — Assessment & Plan Note (Signed)
Seems to have trap spasm. Start cyclobenzaprine at night and start stretches. Follow up in 1 month.

## 2016-04-07 NOTE — Patient Instructions (Addendum)
- Please call to get your mammogram scheduled. The number is on the card we gave you today. - Please check with your insurance to see if they will cover your shingles vaccine. If they cover it here, we can get it for you, if not, you can get it at the pharmacy.  Health Maintenance, Female Adopting a healthy lifestyle and getting preventive care can go a long way to promote health and wellness. Talk with your health care provider about what schedule of regular examinations is right for you. This is a good chance for you to check in with your provider about disease prevention and staying healthy. In between checkups, there are plenty of things you can do on your own. Experts have done a lot of research about which lifestyle changes and preventive measures are most likely to keep you healthy. Ask your health care provider for more information. WEIGHT AND DIET  Eat a healthy diet  Be sure to include plenty of vegetables, fruits, low-fat dairy products, and lean protein.  Do not eat a lot of foods high in solid fats, added sugars, or salt.  Get regular exercise. This is one of the most important things you can do for your health.  Most adults should exercise for at least 150 minutes each week. The exercise should increase your heart rate and make you sweat (moderate-intensity exercise).  Most adults should also do strengthening exercises at least twice a week. This is in addition to the moderate-intensity exercise.  Maintain a healthy weight  Body mass index (BMI) is a measurement that can be used to identify possible weight problems. It estimates body fat based on height and weight. Your health care provider can help determine your BMI and help you achieve or maintain a healthy weight.  For females 77 years of age and older:   A BMI below 18.5 is considered underweight.  A BMI of 18.5 to 24.9 is normal.  A BMI of 25 to 29.9 is considered overweight.  A BMI of 30 and above is considered  obese.  Watch levels of cholesterol and blood lipids  You should start having your blood tested for lipids and cholesterol at 57 years of age, then have this test every 5 years.  You may need to have your cholesterol levels checked more often if:  Your lipid or cholesterol levels are high.  You are older than 57 years of age.  You are at high risk for heart disease.  CANCER SCREENING   Lung Cancer  Lung cancer screening is recommended for adults 19-4 years old who are at high risk for lung cancer because of a history of smoking.  A yearly low-dose CT scan of the lungs is recommended for people who:  Currently smoke.  Have quit within the past 15 years.  Have at least a 30-pack-year history of smoking. A pack year is smoking an average of one pack of cigarettes a day for 1 year.  Yearly screening should continue until it has been 15 years since you quit.  Yearly screening should stop if you develop a health problem that would prevent you from having lung cancer treatment.  Breast Cancer  Practice breast self-awareness. This means understanding how your breasts normally appear and feel.  It also means doing regular breast self-exams. Let your health care provider know about any changes, no matter how small.  If you are in your 20s or 30s, you should have a clinical breast exam (CBE) by a health care  provider every 1-3 years as part of a regular health exam.  If you are 40 or older, have a CBE every year. Also consider having a breast X-ray (mammogram) every year.  If you have a family history of breast cancer, talk to your health care provider about genetic screening.  If you are at high risk for breast cancer, talk to your health care provider about having an MRI and a mammogram every year.  Breast cancer gene (BRCA) assessment is recommended for women who have family members with BRCA-related cancers. BRCA-related cancers  include:  Breast.  Ovarian.  Tubal.  Peritoneal cancers.  Results of the assessment will determine the need for genetic counseling and BRCA1 and BRCA2 testing. Cervical Cancer Your health care provider may recommend that you be screened regularly for cancer of the pelvic organs (ovaries, uterus, and vagina). This screening involves a pelvic examination, including checking for microscopic changes to the surface of your cervix (Pap test). You may be encouraged to have this screening done every 3 years, beginning at age 21.  For women ages 30-65, health care providers may recommend pelvic exams and Pap testing every 3 years, or they may recommend the Pap and pelvic exam, combined with testing for human papilloma virus (HPV), every 5 years. Some types of HPV increase your risk of cervical cancer. Testing for HPV may also be done on women of any age with unclear Pap test results.  Other health care providers may not recommend any screening for nonpregnant women who are considered low risk for pelvic cancer and who do not have symptoms. Ask your health care provider if a screening pelvic exam is right for you.  If you have had past treatment for cervical cancer or a condition that could lead to cancer, you need Pap tests and screening for cancer for at least 20 years after your treatment. If Pap tests have been discontinued, your risk factors (such as having a new sexual partner) need to be reassessed to determine if screening should resume. Some women have medical problems that increase the chance of getting cervical cancer. In these cases, your health care provider may recommend more frequent screening and Pap tests. Colorectal Cancer  This type of cancer can be detected and often prevented.  Routine colorectal cancer screening usually begins at 57 years of age and continues through 57 years of age.  Your health care provider may recommend screening at an earlier age if you have risk factors for  colon cancer.  Your health care provider may also recommend using home test kits to check for hidden blood in the stool.  A small camera at the end of a tube can be used to examine your colon directly (sigmoidoscopy or colonoscopy). This is done to check for the earliest forms of colorectal cancer.  Routine screening usually begins at age 50.  Direct examination of the colon should be repeated every 5-10 years through 57 years of age. However, you may need to be screened more often if early forms of precancerous polyps or small growths are found. Skin Cancer  Check your skin from head to toe regularly.  Tell your health care provider about any new moles or changes in moles, especially if there is a change in a mole's shape or color.  Also tell your health care provider if you have a mole that is larger than the size of a pencil eraser.  Always use sunscreen. Apply sunscreen liberally and repeatedly throughout the day.  Protect yourself   by wearing long sleeves, pants, a wide-brimmed hat, and sunglasses whenever you are outside. HEART DISEASE, DIABETES, AND HIGH BLOOD PRESSURE   High blood pressure causes heart disease and increases the risk of stroke. High blood pressure is more likely to develop in:  People who have blood pressure in the high end of the normal range (130-139/85-89 mm Hg).  People who are overweight or obese.  People who are African American.  If you are 69-2 years of age, have your blood pressure checked every 3-5 years. If you are 13 years of age or older, have your blood pressure checked every year. You should have your blood pressure measured twice--once when you are at a hospital or clinic, and once when you are not at a hospital or clinic. Record the average of the two measurements. To check your blood pressure when you are not at a hospital or clinic, you can use:  An automated blood pressure machine at a pharmacy.  A home blood pressure monitor.  If you  are between 66 years and 34 years old, ask your health care provider if you should take aspirin to prevent strokes.  Have regular diabetes screenings. This involves taking a blood sample to check your fasting blood sugar level.  If you are at a normal weight and have a low risk for diabetes, have this test once every three years after 57 years of age.  If you are overweight and have a high risk for diabetes, consider being tested at a younger age or more often. PREVENTING INFECTION  Hepatitis B  If you have a higher risk for hepatitis B, you should be screened for this virus. You are considered at high risk for hepatitis B if:  You were born in a country where hepatitis B is common. Ask your health care provider which countries are considered high risk.  Your parents were born in a high-risk country, and you have not been immunized against hepatitis B (hepatitis B vaccine).  You have HIV or AIDS.  You use needles to inject street drugs.  You live with someone who has hepatitis B.  You have had sex with someone who has hepatitis B.  You get hemodialysis treatment.  You take certain medicines for conditions, including cancer, organ transplantation, and autoimmune conditions. Hepatitis C  Blood testing is recommended for:  Everyone born from [birth year] through 1967.  Anyone with known risk factors for hepatitis C. Sexually transmitted infections (STIs)  You should be screened for sexually transmitted infections (STIs) including gonorrhea and chlamydia if:  You are sexually active and are younger than 57 years of age.  You are older than 57 years of age and your health care provider tells you that you are at risk for this type of infection.  Your sexual activity has changed since you were last screened and you are at an increased risk for chlamydia or gonorrhea. Ask your health care provider if you are at risk.  If you do not have HIV, but are at risk, it may be recommended that you  take a prescription medicine daily to prevent HIV infection. This is called pre-exposure prophylaxis (PrEP). You are considered at risk if:  You are sexually active and do not regularly use condoms or know the HIV status of your partner(s).  You take drugs by injection.  You are sexually active with a partner who has HIV. Talk with your health care provider about whether you are at high risk of being infected with HIV.  If you choose to begin PrEP, you should first be tested for HIV. You should then be tested every 3 months for as long as you are taking PrEP.  PREGNANCY   If you are premenopausal and you may become pregnant, ask your health care provider about preconception counseling.  If you may become pregnant, take 400 to 800 micrograms (mcg) of folic acid every day.  If you want to prevent pregnancy, talk to your health care provider about birth control (contraception). OSTEOPOROSIS AND MENOPAUSE   Osteoporosis is a disease in which the bones lose minerals and strength with aging. This can result in serious bone fractures. Your risk for osteoporosis can be identified using a bone density scan.  If you are 77 years of age or older, or if you are at risk for osteoporosis and fractures, ask your health care provider if you should be screened.  Ask your health care provider whether you should take a calcium or vitamin D supplement to lower your risk for osteoporosis.  Menopause may have certain physical symptoms and risks.  Hormone replacement therapy may reduce some of these symptoms and risks. Talk to your health care provider about whether hormone replacement therapy is right for you.  HOME CARE INSTRUCTIONS   Schedule regular health, dental, and eye exams.  Stay current with your immunizations.   Do not use any tobacco products including cigarettes, chewing tobacco, or electronic cigarettes.  If you are pregnant, do not drink alcohol.  If you are breastfeeding, limit how  much and how often you drink alcohol.  Limit alcohol intake to no more than 1 drink per day for nonpregnant women. One drink equals 12 ounces of beer, 5 ounces of wine, or 1 ounces of hard liquor.  Do not use street drugs.  Do not share needles.  Ask your health care provider for help if you need support or information about quitting drugs.  Tell your health care provider if you often feel depressed.  Tell your health care provider if you have ever been abused or do not feel safe at home.   This information is not intended to replace advice given to you by your health care provider. Make sure you discuss any questions you have with your health care provider.   Document Released: 04/21/2011 Document Revised: 10/27/2014 Document Reviewed: 09/07/2013 Elsevier Interactive Patient Education [employer]. Menopause is a normal process in which your reproductive ability comes to an end. This process happens gradually over a span of months to years, usually between the ages of 62 and 27. Menopause is complete when you have missed 12 consecutive menstrual periods. It is important to talk with your health care provider about some of the most common conditions that affect postmenopausal women, such as heart disease, cancer, and bone loss (osteoporosis). Adopting a healthy lifestyle and getting preventive care can help to promote your health and wellness. Those actions can also lower your chances of developing some of these common conditions. WHAT SHOULD I KNOW ABOUT MENOPAUSE? During menopause, you may experience a number of symptoms, such as:  Moderate-to-severe hot flashes.  Night sweats.  Decrease in sex drive.  Mood swings.  Headaches.  Tiredness.  Irritability.  Memory problems.  Insomnia. Choosing to treat or not to treat menopausal changes is an individual decision that you make with your health care provider. WHAT SHOULD I KNOW ABOUT HORMONE REPLACEMENT THERAPY AND  SUPPLEMENTS? Hormone therapy products are effective for treating symptoms that are associated with menopause, such  as hot flashes and night sweats. Hormone replacement carries certain risks, especially as you become older. If you are thinking about using estrogen or estrogen with progestin treatments, discuss the benefits and risks with your health care provider. WHAT SHOULD I KNOW ABOUT HEART DISEASE AND STROKE? Heart disease, heart attack, and stroke become more likely as you age. This may be due, in part, to the hormonal changes that your body experiences during menopause. These can affect how your body processes dietary fats, triglycerides, and cholesterol. Heart attack and stroke are both medical emergencies. There are many things that you can do to help prevent heart disease and stroke:  Have your blood pressure checked at least every 1-2 years. High blood pressure causes heart disease and increases the risk of stroke.  If you are 80-37 years old, ask your health care provider if you should take aspirin to prevent a heart attack or a stroke.  Do not use any tobacco products, including cigarettes, chewing tobacco, or electronic cigarettes. If you need help quitting, ask your health care provider.  It is important to eat a healthy diet and maintain a healthy weight.  Be sure to include plenty of vegetables, fruits, low-fat dairy products, and lean protein.  Avoid eating foods that are high in solid fats, added sugars, or salt (sodium).  Get regular exercise. This is one of the most important things that you can do for your health.  Try to exercise for at least 150 minutes each week. The type of exercise that you do should increase your heart rate and make you sweat. This is known as moderate-intensity exercise.  Try to do strengthening exercises at least twice each week. Do these in addition to the moderate-intensity exercise.  Know your numbers.Ask your health care provider to check  your cholesterol and your blood glucose. Continue to have your blood tested as directed by your health care provider. WHAT SHOULD I KNOW ABOUT CANCER SCREENING? There are several types of cancer. Take the following steps to reduce your risk and to catch any cancer development as early as possible. Breast Cancer  Practice breast self-awareness.  This means understanding how your breasts normally appear and feel.  It also means doing regular breast self-exams. Let your health care provider know about any changes, no matter how small.  If you are 85 or older, have a clinician do a breast exam (clinical breast exam or CBE) every year. Depending on your age, family history, and medical history, it may be recommended that you also have a yearly breast X-ray (mammogram).  If you have a family history of breast cancer, talk with your health care provider about genetic screening.  If you are at high risk for breast cancer, talk with your health care provider about having an MRI and a mammogram every year.  Breast cancer (BRCA) gene test is recommended for women who have family members with BRCA-related cancers. Results of the assessment will determine the need for genetic counseling and BRCA1 and for BRCA2 testing. BRCA-related cancers include these types:  Breast. This occurs in males or females.  Ovarian.  Tubal. This may also be called fallopian tube cancer.  Cancer of the abdominal or pelvic lining (peritoneal cancer).  Prostate.  Pancreatic. Cervical, Uterine, and Ovarian Cancer Your health care provider may recommend that you be screened regularly for cancer of the pelvic organs. These include your ovaries, uterus, and vagina. This screening involves a pelvic exam, which includes checking for microscopic changes to the surface  of your cervix (Pap test).  For women ages 21-65, health care providers may recommend a pelvic exam and a Pap test every three years. For women ages 66-65, they  may recommend the Pap test and pelvic exam, combined with testing for human papilloma virus (HPV), every five years. Some types of HPV increase your risk of cervical cancer. Testing for HPV may also be done on women of any age who have unclear Pap test results.  Other health care providers may not recommend any screening for nonpregnant women who are considered low risk for pelvic cancer and have no symptoms. Ask your health care provider if a screening pelvic exam is right for you.  If you have had past treatment for cervical cancer or a condition that could lead to cancer, you need Pap tests and screening for cancer for at least 20 years after your treatment. If Pap tests have been discontinued for you, your risk factors (such as having a new sexual partner) need to be reassessed to determine if you should start having screenings again. Some women have medical problems that increase the chance of getting cervical cancer. In these cases, your health care provider may recommend that you have screening and Pap tests more often.  If you have a family history of uterine cancer or ovarian cancer, talk with your health care provider about genetic screening.  If you have vaginal bleeding after reaching menopause, tell your health care provider.  There are currently no reliable tests available to screen for ovarian cancer. Lung Cancer Lung cancer screening is recommended for adults 36-101 years old who are at high risk for lung cancer because of a history of smoking. A yearly low-dose CT scan of the lungs is recommended if you:  Currently smoke.  Have a history of at least 30 pack-years of smoking and you currently smoke or have quit within the past 15 years. A pack-year is smoking an average of one pack of cigarettes per day for one year. Yearly screening should:  Continue until it has been 15 years since you quit.  Stop if you develop a health problem that would prevent you from having lung cancer  treatment. Colorectal Cancer  This type of cancer can be detected and can often be prevented.  Routine colorectal cancer screening usually begins at age 77 and continues through age 57.  If you have risk factors for colon cancer, your health care provider may recommend that you be screened at an earlier age.  If you have a family history of colorectal cancer, talk with your health care provider about genetic screening.  Your health care provider may also recommend using home test kits to check for hidden blood in your stool.  A small camera at the end of a tube can be used to examine your colon directly (sigmoidoscopy or colonoscopy). This is done to check for the earliest forms of colorectal cancer.  Direct examination of the colon should be repeated every 5-10 years until age 87. However, if early forms of precancerous polyps or small growths are found or if you have a family history or genetic risk for colorectal cancer, you may need to be screened more often. Skin Cancer  Check your skin from head to toe regularly.  Monitor any moles. Be sure to tell your health care provider:  About any new moles or changes in moles, especially if there is a change in a mole's shape or color.  If you have a mole that is larger  than the size of a pencil eraser.  If any of your family members has a history of skin cancer, especially at a young age, talk with your health care provider about genetic screening.  Always use sunscreen. Apply sunscreen liberally and repeatedly throughout the day.  Whenever you are outside, protect yourself by wearing long sleeves, pants, a wide-brimmed hat, and sunglasses. WHAT SHOULD I KNOW ABOUT OSTEOPOROSIS? Osteoporosis is a condition in which bone destruction happens more quickly than new bone creation. After menopause, you may be at an increased risk for osteoporosis. To help prevent osteoporosis or the bone fractures that can happen because of osteoporosis, the  following is recommended:  If you are 67-33 years old, get at least 1,000 mg of calcium and at least 600 mg of vitamin D per day.  If you are older than age 58 but younger than age 100, get at least 1,200 mg of calcium and at least 600 mg of vitamin D per day.  If you are older than age 67, get at least 1,200 mg of calcium and at least 800 mg of vitamin D per day. Smoking and excessive alcohol intake increase the risk of osteoporosis. Eat foods that are rich in calcium and vitamin D, and do weight-bearing exercises several times each week as directed by your health care provider. WHAT SHOULD I KNOW ABOUT HOW MENOPAUSE AFFECTS [place]? Depression may occur at any age, but it is more common as you become older. Common symptoms of depression include:  Low or sad mood.  Changes in sleep patterns.  Changes in appetite or eating patterns.  Feeling an overall lack of motivation or enjoyment of activities that you previously enjoyed.  Frequent crying spells. Talk with your health care provider if you think that you are experiencing depression. WHAT SHOULD I KNOW ABOUT IMMUNIZATIONS? It is important that you get and maintain your immunizations. These include:  Tetanus, diphtheria, and pertussis (Tdap) booster vaccine.  Influenza every year before the flu season begins.  Pneumonia vaccine.  Shingles vaccine. Your health care provider may also recommend other immunizations.   This information is not intended to replace advice given to you by your health care provider. Make sure you discuss any questions you have with your health care provider.   Document Released: 11/28/2005 Document Revised: 10/27/2014 Document Reviewed: 06/08/2014 Elsevier Interactive Patient Education 2016 [place]. Pneumococcal Vaccine, Polyvalent solution for injection What is this medicine? PNEUMOCOCCAL VACCINE, POLYVALENT (NEU mo KOK al vak SEEN, pol ee VEY luhnt) is a vaccine to prevent pneumococcus  bacteria infection. These bacteria are a major cause of ear infections, Strep throat infections, and serious pneumonia, meningitis, or blood infections worldwide. These vaccines help the body to produce antibodies (protective substances) that help your body defend against these bacteria. This vaccine is recommended for people 40 years of age and older with health problems. It is also recommended for all adults over 38 years old. This vaccine will not treat an infection. This medicine may be used for other purposes; ask your health care provider or pharmacist if you have questions. What should I tell my health care provider before I take this medicine? They need to know if you have any of these conditions: -bleeding problems -bone marrow or organ transplant -cancer, Hodgkin's disease -fever -infection -immune system problems -low platelet count in the blood -seizures -an unusual or allergic reaction to pneumococcal vaccine, diphtheria toxoid, other vaccines, latex, other medicines, foods, dyes, or preservatives -pregnant or trying to get  pregnant -breast-feeding How should I use this medicine? This vaccine is for injection into a muscle or under the skin. It is given by a health care professional. A copy of Vaccine Information Statements will be given before each vaccination. Read this sheet carefully each time. The sheet may change frequently. Talk to your pediatrician regarding the use of this medicine in children. While this drug may be prescribed for children as young as 7 years of age for selected conditions, precautions do apply. Overdosage: If you think you have taken too much of this medicine contact a poison control center or emergency room at once. NOTE: This medicine is only for you. Do not share this medicine with others. What if I miss a dose? It is important not to miss your dose. Call your doctor or health care professional if you are unable to keep an appointment. What may  interact with this medicine? -medicines for cancer chemotherapy -medicines that suppress your immune function -medicines that treat or prevent blood clots like warfarin, enoxaparin, and dalteparin -steroid medicines like prednisone or cortisone This list may not describe all possible interactions. Give your health care provider a list of all the medicines, herbs, non-prescription drugs, or dietary supplements you use. Also tell them if you smoke, drink alcohol, or use illegal drugs. Some items may interact with your medicine. What should I watch for while using this medicine? Mild fever and pain should go away in 3 days or less. Report any unusual symptoms to your doctor or health care professional. What side effects may I notice from receiving this medicine? Side effects that you should report to your doctor or health care professional as soon as possible: -allergic reactions like skin rash, itching or hives, swelling of the face, lips, or tongue -breathing problems -confused -fever over 102 degrees F -pain, tingling, numbness in the hands or feet -seizures -unusual bleeding or bruising -unusual muscle weakness Side effects that usually do not require medical attention (report to your doctor or health care professional if they continue or are bothersome): -aches and pains -diarrhea -fever of 102 degrees F or less -headache -irritable -loss of appetite -pain, tender at site where injected -trouble sleeping This list may not describe all possible side effects. Call your doctor for medical advice about side effects. You may report side effects to FDA at 1-800-FDA-1088. Where should I keep my medicine? This does not apply. This vaccine is given in a clinic, pharmacy, doctor's office, or other health care setting and will not be stored at home. NOTE: This sheet is a summary. It may not cover all possible information. If you have questions about this medicine, talk to your doctor,  pharmacist, or health care provider.    2016, [employer]. (2008-05-12 14:32:37)  EXERCISES  RANGE OF MOTION (ROM) AND STRETCHING EXERCISES - Trapezius spasm  These exercises may help you when beginning to rehabilitate your injury. Your symptoms may resolve with or without further involvement from your physician, physical therapist or athletic trainer. While completing these exercises, remember:  Restoring tissue flexibility helps normal motion to return to the joints. This allows healthier, less painful movement and activity.  An effective stretch should be held for at least 30 seconds.  A stretch should never be painful. You should only feel a gentle lengthening or release in the stretched tissue.   STRETCH - Flexion, Standing  Stand with good posture. With an underhand grip on your right / left and an overhand grip on the opposite hand, grasp  a broomstick or cane so that your hands are a little more than shoulder-width apart.  Keeping your right / left elbow straight and shoulder muscles relaxed, push the stick with your opposite hand to raise your right / left arm in front of your body and then overhead. Raise your arm until you feel a stretch in your right / left shoulder, but before you have increased shoulder pain.  Try to avoid shrugging your right / left shoulder as your arm rises by keeping your shoulder blade tucked down and toward your mid-back spine. Hold __________ seconds.  Slowly return to the starting position. Repeat __________ times. Complete this exercise __________ times per day.    STRETCH - Abduction, Supine  Stand with good posture. With an underhand grip on your right / left and an overhand grip on the opposite hand, grasp a broomstick or cane so that your hands are a little more than shoulder-width apart.  Keeping your right / left elbow straight and shoulder muscles relaxed, push the stick with your opposite hand to raise your right / left arm out to the  side of your body and then overhead. Raise your arm until you feel a stretch in your right / left shoulder, but before you have increased shoulder pain.  Try to avoid shrugging your right / left shoulder as your arm rises by keeping your shoulder blade tucked down and toward your mid-back spine. Hold __________ seconds.  Slowly return to the starting position. Repeat __________ times. Complete this exercise __________ times per day.    ROM - Flexion, Active-Assisted  Lie on your back. You may bend your knees for comfort.  Grasp a broomstick or cane so your hands are about shoulder-width apart. Your right / left hand should grip the end of the stick/cane so that your hand is positioned "thumbs-up," as if you were about to shake hands.  Using your healthy arm to lead, raise your right / left arm overhead until you feel a gentle stretch in your shoulder. Hold __________ seconds.  Use the stick/cane to assist in returning your right / left arm to its starting position. Repeat __________ times. Complete this exercise __________ times per day.    STRETCH - External Rotation and Abduction  Stagger your stance through a doorframe. It does not matter which foot is forward.  Choose one of the following positions as instructed by your physician, physical therapist or athletic trainer: place your hands:  and forearms above your head and on the door frame.  and forearms at head-height and on the door frame.  at elbow-height and on the door frame. Keeping your head and chest upright and your stomach muscles tight to prevent over-extending your low-back, slowly shift your weight onto your front foot until you feel a stretch across your chest and/or in the front of your shoulders.  Hold __________ seconds. Shift your weight to your back foot to release the stretch. Repeat __________ times. Complete this stretch __________ times per day.  STRENGTHENING EXERCISES - Trapezius Spasm These exercises may help you  when beginning to rehabilitate your injury. They may resolve your symptoms with or without further involvement from your physician, physical therapist or athletic trainer. While completing these exercises, remember:  Muscles can gain both the endurance and the strength needed for everyday activities through controlled exercises.  Complete these exercises as instructed by your physician, physical therapist or athletic trainer. Progress with the resistance and repetition exercises only as your caregiver advises.  You  may experience muscle soreness or fatigue, but the pain or discomfort you are trying to eliminate should never worsen during these exercises. If this pain does worsen, stop and make certain you are following the directions exactly. If the pain is still present after adjustments, discontinue the exercise until you can discuss the trouble with your clinician.   STRENGTH - Scapular Depression and Adduction  With good posture, sit on a firm chair. Support your arms in front of you with pillows, arm rests or a table top. Have your elbows in line with the sides of your body.  Gently draw your shoulder blades down and toward your mid-back spine. Gradually increase the tension without tensing the muscles along the top of your shoulders and the back of your neck.  Hold for __________ seconds. Slowly release the tension and relax your muscles completely before completing the next repetition.  After you have practiced this exercise, remove the arm support and complete it while standing as well as sitting. Repeat __________ times. Complete this exercise __________ times per day.    STRENGTH - Shoulder Abductors, Isometric  With good posture, stand or sit about 4-6 inches from a wall with your right / left side facing the wall.  Bend your right / left elbow. Gently press your right / left elbow into the wall. Increase the pressure gradually until you are pressing as hard as you can without shrugging  your shoulder or increasing any shoulder discomfort.  Hold __________ seconds.  Release the tension slowly. Relax your shoulder muscles completely before you do the next repetition. Repeat __________ times. Complete this exercise __________ times per day.    STRENGTH - Shoulder Flexion, Isometric  With good posture and facing a wall, stand or sit about 4-6 inches away.  Keeping your right / left elbow straight, gently press the top of your fist into the wall. Increase the pressure gradually until you are pressing as hard as you can without shrugging your shoulder or increasing any shoulder discomfort.  Hold __________ seconds.  Release the tension slowly. Relax your shoulder muscles completely before you do the next repetition. Repeat __________ times. Complete this exercise __________ times per day.    STRENGTH - Internal Rotators  Secure a rubber exercise band/tubing to a fixed object so that it is at the same height as your right / left elbow when you are standing or sitting on a firm surface.  Stand or sit so that the secured exercise band/tubing is at your right / left side.  Bend your elbow 90 degrees. Place a folded towel or small pillow under your right / left arm so that your elbow is a few inches away from your side.  Keeping the tension on the exercise band/tubing, pull it across your body toward your abdomen. Be sure to keep your body steady so that the movement is only coming from your shoulder rotating.  Hold __________ seconds. Release the tension in a controlled manner as you return to the starting position. Repeat __________ times. Complete this exercise __________ times per day.    STRENGTH - External Rotators  Secure a rubber exercise band/tubing to a fixed object so that it is at the same height as your right / left elbow when you are standing or sitting on a firm surface.  Stand or sit so that the secured exercise band/tubing is at your side that is not injured.  Bend  your elbow 90 degrees. Place a folded towel or small pillow under your  right / left arm so that your elbow is a few inches away from your side.  Keeping the tension on the exercise band/tubing, pull it away from your body, as if pivoting on your elbow. Be sure to keep your body steady so that the movement is only coming from your shoulder rotating.  Hold __________ seconds. Release the tension in a controlled manner as you return to the starting position. Repeat __________ times. Complete this exercise __________ times per day.    STRENGTH - Shoulder Extensors  Secure a rubber exercise band/tubing so that it is at the height of your shoulders when you are either standing or sitting on a firm arm-less chair.  With a thumbs-up grip, grasp an end of the band/tubing in each hand. Straighten your elbows and lift your hands straight in front of you at shoulder height. Step back away from the secured end of band/tubing until it becomes tense.  Squeezing your shoulder blades together, pull your hands down to the sides of your thighs. Do not allow your hands to go behind you.  Hold for __________ seconds. Slowly ease the tension on the band/tubing as you reverse the directions and return to the starting position. Repeat __________ times. Complete this exercise __________ times per day.    STRENGTH - Shoulder Extensors, Prone  Lie on your stomach on a firm surface so that your right / left arm overhangs the edge. Rest your forehead on your opposite forearm. With your thumb facing away from your body and your elbow straight, hold a __________ weight in your hand.  Squeeze your right / left shoulder blade to your mid-back spine and then slowly raise your arm behind you to the height of the bed.  Hold for __________ seconds. Slowly reverse the directions and return to the starting position, controlling the weight as you lower your arm. Repeat __________ times. Complete this exercise __________ times per day.     STRENGTH - Horizontal Abductors  Choose one of the two oppositions to complete this exercise.  Prone (lying on stomach):  Lie on your stomach on a firm surface so that your right / left arm overhangs the edge. Rest your forehead on your opposite forearm. With your palm facing the floor and your elbow straight, hold a __________ weight in your hand.  Squeeze your right / left shoulder blade to your mid-back spine and then slowly raise your arm to the height of the bed.  Hold for __________ seconds. Slowly reverse the directions and return to the starting position, controlling the weight as you lower your arm. Repeat __________ times. Complete this exercise __________ times per day.  Standing:  Secure a rubber exercise band/tubing so that it is at the height of your shoulders when you are either standing or sitting on a firm arm-less chair.  Grasp an end of the band/tubing in each hand and have your palms face each other. Straighten your elbows and lift your hands straight in front of you at shoulder height. Step back away from the secured end of band/tubing until it becomes tense.  Squeeze your shoulder blades together. Keeping your elbows locked and your hands at shoulder-height, bring your hands out to your side.  Hold __________ seconds. Slowly ease the tension on the band/tubing as you reverse the directions and return to the starting position. Repeat __________ times. Complete this exercise __________ times per day.    STRENGTH - Scapular Retractors and Elevators  Secure a rubber exercise band/tubing so that it  is at the height of your shoulders when you are either standing or sitting on a firm arm-less chair.  With a thumbs-up grip, grasp an end of the band/tubing in each hand. Step back away from the secured end of band/tubing until it becomes tense.  Squeezing your shoulder blades together, straighten your elbows and lift your hands straight over your head.  Hold for __________  seconds. Slowly ease the tension on the band/tubing as you reverse the directions and return to the starting position. Repeat __________ times. Complete this exercise __________ times per day.

## 2016-04-07 NOTE — Assessment & Plan Note (Signed)
Rechecking levels today. Will adjust as needed. Await results.

## 2016-04-07 NOTE — Assessment & Plan Note (Signed)
Likely due to depression. Will adjust medicine, check labs and continue to monitor.

## 2016-04-07 NOTE — Assessment & Plan Note (Signed)
Not doing well. Still very depressed. On max dose effexor. Will start wellbutrin and recheck in 1 month.

## 2016-04-07 NOTE — Progress Notes (Signed)
BP 140/82 mmHg  Pulse 77  Temp(Src) 99 F (37.2 C)  Ht  (1.626 m)  Wt 201 lb (91.173 kg)  BMI 34.48 kg/m2  SpO2 96%   Subjective:    Patient ID: Emily [name], female    DOB: 1961-10-11, 55 y.o.   MRN: [id number]  HPI: Emily [name] is a 55 y.o. female presenting on 04/07/2016 for comprehensive medical examination. Current medical complaints include:  HYPERTENSION Hypertension status: stable  Satisfied with current treatment? yes Duration of hypertension: chronic BP monitoring frequency:  not checking BP medication side effects:  no Medication compliance: excellent compliance Aspirin: no Recurrent headaches: no Visual changes: no Palpitations: yes Dyspnea: no Chest pain: no Lower extremity edema: no Dizzy/lightheaded: no  HYPOTHYROIDISM Thyroid control status:controlled Satisfied with current treatment? yes Medication side effects: no Medication compliance: excellent compliance Recent dose adjustment:yes Fatigue: yes Cold intolerance: no Heat intolerance: no Weight gain: no Weight loss: no Constipation: no Diarrhea/loose stools: no Palpitations: yes Lower extremity edema: no Anxiety/depressed mood: yes  DEPRESSION Mood status: uncontrolled Satisfied with current treatment?: no Symptom severity: moderate  Duration of current treatment : chronic Side effects: no Medication compliance: excellent compliance Psychotherapy/counseling: no  Depressed mood: yes Anxious mood: yes Anhedonia: yes Significant weight loss or gain: no Insomnia: no  Fatigue: yes Feelings of worthlessness or guilt: yes Impaired concentration/indecisiveness: yes Suicidal ideations: no Hopelessness: yes Crying spells: no Depression screen [facility] 2/9 04/07/2016 02/18/2016  Decreased Interest 0 3  Down, Depressed, Hopeless 2 3  PHQ - 2 Score 2 6  Altered sleeping 3 3  Tired, decreased energy 3 3  Change in appetite 2 1  Feeling bad or failure about yourself  1 3  Trouble  concentrating 3 3  Moving slowly or fidgety/restless 0 0  Suicidal thoughts 0 0  PHQ-9 Score 14 19  Difficult doing work/chores Somewhat difficult Very difficult   NECK PAIN- has been stiff for about 2 months.  Status: exacerbated Treatments attempted: icy hot   Relief with NSAIDs?:  No NSAIDs Taken Location:Left Duration:2 months Severity: moderate Quality: dull Frequency: constant Radiation: headache Aggravating factors: laying on it Alleviating factors: nothing Weakness:  yes Paresthesias / decreased sensation:  no  Fevers:  no  She currently lives with: husband Menopausal Symptoms: yes- hot flashes under good control  Past Medical History:  Past Medical History  Diagnosis Date  . Essential hypertension   . Depression   . Anxiety   . Myalgia   . Hypothyroidism   . GERD (gastroesophageal reflux disease)   . Chest pain     a. 10/2013 Neg ETT; b. 10/2015 Refused MV->Cath: nl cors, nl EF.  . Morbid obesity [facility])     Surgical History:  Past Surgical History  Procedure Laterality Date  . Cholecystectomy    . Knee surgery Right   . Tubal ligation    . Carpal tunnel release Left 07/03/2015    Procedure: CARPAL TUNNEL RELEASE;  Surgeon: Myra Rude, MD;  Location: [facility] ORS;  Service: Orthopedics;  Laterality: Left;  . Carpal tunnel release Right 09/07/2015    Procedure: CARPAL TUNNEL RELEASE;  Surgeon: Myra Rude, MD;  Location: [facility] ORS;  Service: Orthopedics;  Laterality: Right;  . Cardiac catheterization N/A 11/14/2015    Procedure: Left Heart Cath and Coronary Angiography;  Surgeon: Iran Ouch, MD;  Location: [facility];  Service: Cardiovascular;  Laterality: N/A;    Medications:  Current Outpatient Prescriptions on File Prior to Visit  Medication Sig  .  cholecalciferol (VITAMIN D) 1000 UNITS tablet Take 1,000 Units by mouth daily.  Marland Kitchen gabapentin (NEURONTIN) 100 MG capsule Take 100 mg by mouth 3 (three) times daily.  Marland Kitchen levothyroxine  (SYNTHROID, LEVOTHROID) 88 MCG tablet Take 1 tablet (88 mcg total) by mouth daily.  Marland Kitchen losartan (COZAAR) 100 MG tablet Take 1 tablet (100 mg total) by mouth daily.  . meloxicam (MOBIC) 7.5 MG tablet Take 7.5 mg by mouth daily. Reported on 12/04/2015  . Naproxen Sodium (ALEVE) 220 MG CAPS Take 1 capsule by mouth as needed.  . Omega-3 Fatty Acids (FISH OIL PO) Take 1 tablet by mouth daily.  Marland Kitchen omeprazole (PRILOSEC) 20 MG capsule Take 20 mg by mouth daily as needed.  . venlafaxine XR (EFFEXOR-XR) 150 MG 24 hr capsule TAKE 1 CAPSULE(150 MG) BY MOUTH DAILY WITH BREAKFAST  . venlafaxine XR (EFFEXOR-XR) 75 MG 24 hr capsule Take 1 capsule (75 mg total) by mouth daily with breakfast.   No current facility-administered medications on file prior to visit.   Allergies:  No Known Allergies  Social History:  Social History   Social History  . Marital Status: Married    Spouse Name: N/A  . Number of Children: N/A  . Years of Education: N/A   Occupational History  . Not on file.   Social History Main Topics  . Smoking status: Former Smoker -- 0.25 packs/day for 10 years    Types: Cigarettes    Quit date: 07/02/1995  . Smokeless tobacco: Never Used  . Alcohol Use: No  . Drug Use: No  . Sexual Activity:    Partners: Male   Other Topics Concern  . Not on file   Social History Narrative   History  Smoking status  . Former Smoker -- 0.25 packs/day for 10 years  . Types: Cigarettes  . Quit date: 07/02/1995  Smokeless tobacco  . Never Used   History  Alcohol Use No    Family History:  Family History  Problem Relation Age of Onset  . Stroke Mother   . Heart failure Mother   . Heart disease Mother   . Hypertension Mother   . Heart attack Father 68  . Hypertension Father   . Hyperlipidemia Father   . Alzheimer's disease Father   . Heart disease Father   . CAD Cousin 53    passed  . Cancer Paternal Uncle     prostate  . Diabetes Maternal Grandmother   . Diabetes Paternal  Grandmother     Past medical history, surgical history, medications, allergies, family history and social history reviewed with patient today and changes made to appropriate areas of the chart.   Review of Systems  Constitutional: Negative.   Eyes: Negative.   Respiratory: Negative.   Cardiovascular: Positive for palpitations. Negative for chest pain, orthopnea, claudication, leg swelling and PND.  Gastrointestinal: Positive for heartburn. Negative for nausea, vomiting, abdominal pain, diarrhea, constipation, blood in stool and melena.  Genitourinary: Negative.   Musculoskeletal: Positive for neck pain. Negative for myalgias, back pain, joint pain and falls.  Skin: Negative.   Neurological: Negative.   Endo/Heme/Allergies: Negative.   Psychiatric/Behavioral: Positive for depression. Negative for suicidal ideas, hallucinations, memory loss and substance abuse. The patient is nervous/anxious. The patient does not have insomnia.     All other ROS negative except what is listed above and in the HPI.      Objective:    BP 140/82 mmHg  Pulse 77  Temp(Src) 99 F (37.2 C)  Ht  5\' 4"  (1.626 m)  Wt 201 lb (91.173 kg)  BMI 34.48 kg/m2  SpO2 96%  Wt Readings from Last 3 Encounters:  04/07/16 201 lb (91.173 kg)  02/18/16 195 lb (88.451 kg)  12/04/15 204 lb 8 oz (92.761 kg)    Physical Exam  Constitutional: She is oriented to person, place, and time. She appears well-developed and well-nourished. No distress.  HENT:  Head: Normocephalic and atraumatic.  Right Ear: Hearing, tympanic membrane, external ear and ear canal normal.  Left Ear: Hearing, tympanic membrane, external ear and ear canal normal.  Nose: Nose normal.  Mouth/Throat: Uvula is midline, oropharynx is clear and moist and mucous membranes are normal. No oropharyngeal exudate.  Eyes: Conjunctivae, EOM and lids are normal. Pupils are equal, round, and reactive to light. Right eye exhibits no discharge. Left eye exhibits no  discharge. No scleral icterus.  Neck: Normal range of motion. Neck supple. No JVD present. No tracheal deviation present. No thyromegaly present.  Cardiovascular: Normal rate, regular rhythm, normal heart sounds and intact distal pulses.  Exam reveals no gallop and no friction rub.   No murmur heard. Pulmonary/Chest: Effort normal and breath sounds normal. No stridor. No respiratory distress. She has no wheezes. She has no rales. She exhibits no tenderness. Right breast exhibits no inverted nipple, no mass, no nipple discharge, no skin change and no tenderness. Left breast exhibits no inverted nipple, no mass, no nipple discharge, no skin change and no tenderness. Breasts are symmetrical.  Abdominal: Soft. Bowel sounds are normal. She exhibits no distension and no mass. There is no tenderness. There is no rebound and no guarding.  Genitourinary: Vagina normal. No vaginal discharge found.  Musculoskeletal: She exhibits no edema or tenderness.  Lymphadenopathy:    She has no cervical adenopathy.  Neurological: She is alert and oriented to person, place, and time. She has normal reflexes. She displays normal reflexes. No cranial nerve deficit. She exhibits normal muscle tone. Coordination normal.  Skin: Skin is warm, dry and intact. No rash noted. She is not diaphoretic. No erythema. No pallor.  Psychiatric: She has a normal mood and affect. Her speech is normal and behavior is normal. Judgment and thought content normal. Cognition and memory are normal.  Nursing note and vitals reviewed. Neck Exam:    Tenderness to Palpation: yes    Midline cervical spine: no    Paraspinal neck musculature: no    Trapezius: yes    Sternocleidomastoid: no     Range of Motion:     Flexion: Decreased    Extension: Decreased    Lateral rotation: Decreased    Lateral bending: Decreased     Neuro Examination: Upper extremity DTRs normal & symmetric.  Strength and sensation intact.    Special Tests:     Spurling  test: negative   Results for orders placed or performed in visit on 02/08/16  Thyroid Panel With TSH  Result Value Ref Range   TSH 3.370 0.450 - 4.500 uIU/mL   T4, Total 7.3 4.5 - 12.0 ug/dL   T3 Uptake Ratio 29 24 - 39 %   Free Thyroxine Index 2.1 1.2 - 4.9      Assessment & Plan:   Problem List Items Addressed This Visit      Cardiovascular and Mediastinum   Hypertension    Slightly elevated. Under a lot of stress. Will work on diet and exercise and recheck in 1 month.       Relevant Orders   Comprehensive metabolic  panel   UA/M w/rflx Culture, Routine     Endocrine   Other specified hypothyroidism    Rechecking levels today. Will adjust as needed. Await results.       Relevant Orders   TSH     Other   Fatigue    Likely due to depression. Will adjust medicine, check labs and continue to monitor.       Relevant Orders   CBC with Differential/Platelet   UA/M w/rflx Culture, Routine   Neck pain    Seems to have trap spasm. Start cyclobenzaprine at night and start stretches. Follow up in 1 month.       Depression    Not doing well. Still very depressed. On max dose effexor. Will start wellbutrin and recheck in 1 month.       Relevant Medications   buPROPion (WELLBUTRIN SR) 150 MG 12 hr tablet    Other Visit Diagnoses    Routine general medical examination at a health care facility    -  Primary    Labs checked today. Vaccines up to date. Mammogram orderd. Colonoscopy up to date. Pap done today. Diet and exercise encouraged.     Relevant Orders    CBC with Differential/Platelet    Comprehensive metabolic panel    Lipid Panel w/o Chol/HDL Ratio    Microalbumin, Urine Waived    TSH    UA/M w/rflx Culture, Routine    Need for hepatitis C screening test        Labs drawn today. Await results.     Relevant Orders    Hepatitis C Antibody    Screening for HIV without presence of risk factors        Labs drawn today. Await results.     Relevant Orders    HIV  antibody    Screening for breast cancer        Order already in. Card given. Encouraged patient to call for appointment.    Immunization due        Pneumovax given today due to former smoking    Relevant Orders    Pneumococcal polysaccharide vaccine 23-valent greater than or equal to 2yo subcutaneous/IM (Completed)    Screening for cervical cancer        Pap and cotesting done today. Await results.     Relevant Orders    IGP, Aptima HPV, rfx 16/18,45        Follow up plan: Return in about 4 weeks (around 05/05/2016) for follow up on mood.   LABORATORY TESTING:  - Pap smear: pap done  IMMUNIZATIONS:   - Tdap: Tetanus vaccination status reviewed: last tetanus booster within 10 years. - Influenza: Postponed to flu season - Pneumovax: Administered today due to former smoker - Prevnar: Not applicable - Zostavax vaccine: Will check with insurance  SCREENING: -Mammogram: Ordered today  - Colonoscopy: Up to date   PATIENT COUNSELING:   Advised to take 1 mg of folate supplement per day if capable of pregnancy.   Sexuality: Discussed sexually transmitted diseases, partner selection, use of condoms, avoidance of unintended pregnancy  and contraceptive alternatives.   Advised to avoid cigarette smoking.  I discussed with the patient that most people either abstain from alcohol or drink within safe limits (<=14/week and <=4 drinks/occasion for males, <=7/weeks and <= 3 drinks/occasion for females) and that the risk for alcohol disorders and other health effects rises proportionally with the number of drinks per week and how often a drinker exceeds daily limits.  Discussed cessation/primary prevention of drug use and availability of treatment for abuse.   Diet: Encouraged to adjust caloric intake to maintain  or achieve ideal body weight, to reduce intake of dietary saturated fat and total fat, to limit sodium intake by avoiding high sodium foods and not adding table salt, and to maintain  adequate dietary potassium and calcium preferably from fresh fruits, vegetables, and low-fat dairy products.    stressed the importance of regular exercise  Injury prevention: Discussed safety belts, safety helmets, smoke detector, smoking near bedding or upholstery.   Dental health: Discussed importance of regular tooth brushing, flossing, and dental visits.    NEXT PREVENTATIVE PHYSICAL DUE IN 1 YEAR. Return in about 4 weeks (around 05/05/2016) for follow up on mood.

## 2016-04-07 NOTE — Assessment & Plan Note (Signed)
Slightly elevated. Under a lot of stress. Will work on diet and exercise and recheck in 1 month.

## 2016-04-08 ENCOUNTER — Telehealth: Payer: Self-pay | Admitting: Family Medicine

## 2016-04-08 DIAGNOSIS — E039 Hypothyroidism, unspecified: Secondary | ICD-10-CM

## 2016-04-08 LAB — MICROSCOPIC EXAMINATION

## 2016-04-08 LAB — CBC WITH DIFFERENTIAL/PLATELET
BASOS ABS: 0.1 10*3/uL (ref 0.0–0.2)
Basos: 2 %
EOS (ABSOLUTE): 0.1 10*3/uL (ref 0.0–0.4)
EOS: 3 %
HEMATOCRIT: 41.1 % (ref 34.0–46.6)
Hemoglobin: 13.7 g/dL (ref 11.1–15.9)
IMMATURE GRANULOCYTES: 0 %
Immature Grans (Abs): 0 10*3/uL (ref 0.0–0.1)
LYMPHS ABS: 2.2 10*3/uL (ref 0.7–3.1)
Lymphs: 41 %
MCH: 30.1 pg (ref 26.6–33.0)
MCHC: 33.3 g/dL (ref 31.5–35.7)
MCV: 90 fL (ref 79–97)
MONOS ABS: 0.4 10*3/uL (ref 0.1–0.9)
Monocytes: 8 %
NEUTROS PCT: 46 %
Neutrophils Absolute: 2.6 10*3/uL (ref 1.4–7.0)
PLATELETS: 278 10*3/uL (ref 150–379)
RBC: 4.55 x10E6/uL (ref 3.77–5.28)
RDW: 13.4 % (ref 12.3–15.4)
WBC: 5.5 10*3/uL (ref 3.4–10.8)

## 2016-04-08 LAB — COMPREHENSIVE METABOLIC PANEL
A/G RATIO: 1.3 (ref 1.2–2.2)
ALT: 23 IU/L (ref 0–32)
AST: 18 IU/L (ref 0–40)
Albumin: 4.4 g/dL (ref 3.5–5.5)
Alkaline Phosphatase: 89 IU/L (ref 39–117)
BILIRUBIN TOTAL: 0.3 mg/dL (ref 0.0–1.2)
BUN/Creatinine Ratio: 24 — ABNORMAL HIGH (ref 9–23)
BUN: 17 mg/dL (ref 6–24)
CO2: 26 mmol/L (ref 18–29)
Calcium: 9.7 mg/dL (ref 8.7–10.2)
Chloride: 98 mmol/L (ref 96–106)
Creatinine, Ser: 0.71 mg/dL (ref 0.57–1.00)
GFR calc non Af Amer: 96 mL/min/{1.73_m2} (ref >59)
GFR, EST AFRICAN AMERICAN: 110 mL/min/{1.73_m2} (ref >59)
GLOBULIN, TOTAL: 3.3 g/dL (ref 1.5–4.5)
Glucose: 89 mg/dL (ref 65–99)
POTASSIUM: 4.3 mmol/L (ref 3.5–5.2)
SODIUM: 140 mmol/L (ref 134–144)
TOTAL PROTEIN: 7.7 g/dL (ref 6.0–8.5)

## 2016-04-08 LAB — UA/M W/RFLX CULTURE, ROUTINE
Bilirubin, UA: NEGATIVE
GLUCOSE, UA: NEGATIVE
Ketones, UA: NEGATIVE
Nitrite, UA: NEGATIVE
RBC, UA: NEGATIVE
SPEC GRAV UA: 1.015 (ref 1.005–1.030)
Urobilinogen, Ur: 0.2 mg/dL (ref 0.2–1.0)
pH, UA: 7 (ref 5.0–7.5)

## 2016-04-08 LAB — LIPID PANEL W/O CHOL/HDL RATIO
CHOLESTEROL TOTAL: 244 mg/dL — AB (ref 100–199)
HDL: 60 mg/dL (ref >39)
LDL CALC: 159 mg/dL — AB (ref 0–99)
TRIGLYCERIDES: 126 mg/dL (ref 0–149)
VLDL CHOLESTEROL CAL: 25 mg/dL (ref 5–40)

## 2016-04-08 LAB — MICROALBUMIN, URINE WAIVED
Creatinine, Urine Waived: 200 mg/dL (ref 10–300)
MICROALB, UR WAIVED: 30 mg/L — AB (ref 0–19)
Microalb/Creat Ratio: <30 mg/g (ref <30)

## 2016-04-08 LAB — HEPATITIS C ANTIBODY

## 2016-04-08 LAB — URINE CULTURE, REFLEX: Organism ID, Bacteria: NO GROWTH

## 2016-04-08 LAB — TSH: TSH: 4.87 u[IU]/mL — ABNORMAL HIGH (ref 0.450–4.500)

## 2016-04-08 LAB — HIV ANTIBODY (ROUTINE TESTING W REFLEX): HIV Screen 4th Generation wRfx: NONREACTIVE

## 2016-04-08 MED ORDER — LEVOTHYROXINE SODIUM 100 MCG PO TABS: 100.0000 ug | ORAL_TABLET | Freq: Every day | ORAL | Status: DC

## 2016-04-08 NOTE — Telephone Encounter (Signed)
Called no answer,unable to leave message. Will try again.

## 2016-04-08 NOTE — Telephone Encounter (Signed)
Please let Emily [name] know that her labs came back normal except her cholesterol was really high and her thyroid was under active. I've gone up on her thyroid medicine and we'll recheck it in 6 weeks. Was she fasting yesterday? If she wasn't, come in fasting in 6 weeks and we'll check. Otherwise she has the choice of working on diet and exercise for 6 months or starting medicine. Let me know what she wants to do. Thanks!

## 2016-04-09 ENCOUNTER — Encounter: Payer: Self-pay | Admitting: Family Medicine

## 2016-04-09 LAB — IGP, APTIMA HPV, RFX 16/18,45
HPV APTIMA: NEGATIVE
PAP Smear Comment: 0

## 2016-04-10 NOTE — Telephone Encounter (Signed)
Patient notified, she will try diet and exercise for her cholesterol, she will come back in for labs in 6 weeks. Appointment made for May 22, 2016.

## 2016-04-29 DIAGNOSIS — G5601 Carpal tunnel syndrome, right upper limb: Secondary | ICD-10-CM | POA: Diagnosis not present

## 2016-04-29 DIAGNOSIS — G5602 Carpal tunnel syndrome, left upper limb: Secondary | ICD-10-CM | POA: Diagnosis not present

## 2016-05-13 ENCOUNTER — Other Ambulatory Visit: Payer: Self-pay | Admitting: Family Medicine

## 2016-05-13 DIAGNOSIS — M542 Cervicalgia: Secondary | ICD-10-CM

## 2016-05-14 ENCOUNTER — Ambulatory Visit: Payer: BLUE CROSS/BLUE SHIELD | Admitting: Family Medicine

## 2016-05-14 NOTE — Progress Notes (Deleted)
There were no vitals taken for this visit.   Subjective:    Patient ID: Emily [name], female    DOB: February 08, 1961, 55 y.o.   MRN: [id number]  HPI: Emily [name] is a 55 y.o. female  No chief complaint on file.  DEPRESSION Mood status: {Blank single:19197::"controlled","uncontrolled","better","worse","exacerbated","stable"} Satisfied with current treatment?: {Blank single:19197::"yes","no"} Symptom severity: {Blank single:19197::"mild","moderate","severe"}  Duration of current treatment : {Blank single:19197::"chronic","months","years"} Side effects: {Blank single:19197::"yes","no"} Medication compliance: {Blank single:19197::"excellent compliance","good compliance","fair compliance","poor compliance"} Psychotherapy/counseling: {Blank single:19197::"yes","no"} {Blank single:19197::"current","in the past"} Previous psychiatric medications: {Blank multiple:19196::"abilify","amitryptiline","buspar","celexa","cymbalta","depakote","effexor","lamictal","lexapro","lithium","nortryptiline","paxil","prozac","pristiq (desvenlafaxine","seroquel","wellbutrin","zoloft","zyprexa"} Depressed mood: {Blank single:19197::"yes","no"} Anxious mood: {Blank single:19197::"yes","no"} Anhedonia: {Blank single:19197::"yes","no"} Significant weight loss or gain: {Blank single:19197::"yes","no"} Insomnia: {Blank single:19197::"yes","no"} {Blank single:19197::"hard to fall asleep","hard to stay asleep"} Fatigue: {Blank single:19197::"yes","no"} Feelings of worthlessness or guilt: {Blank single:19197::"yes","no"} Impaired concentration/indecisiveness: {Blank single:19197::"yes","no"} Suicidal ideations: {Blank single:19197::"yes","no"} Hopelessness: {Blank single:19197::"yes","no"} Crying spells: {Blank single:19197::"yes","no"} Depression screen [facility] 2/9 04/07/2016 02/18/2016  Decreased Interest 0 3  Down, Depressed, Hopeless 2 3  PHQ - 2 Score 2 6  Altered sleeping 3 3  Tired, decreased energy 3 3  Change in  appetite 2 1  Feeling bad or failure about yourself  1 3  Trouble concentrating 3 3  Moving slowly or fidgety/restless 0 0  Suicidal thoughts 0 0  PHQ-9 Score 14 19  Difficult doing work/chores Somewhat difficult Very difficult   HYPERTENSION Hypertension status: {Blank single:19197::"controlled","uncontrolled","better","worse","exacerbated","stable"}  Satisfied with current treatment? {Blank single:19197::"yes","no"} Duration of hypertension: chronic BP monitoring frequency:  not checking BP medication side effects:  no Aspirin: yes Recurrent headaches: {Blank single:19197::"yes","no"} Visual changes: {Blank single:19197::"yes","no"} Palpitations: {Blank single:19197::"yes","no"} Dyspnea: {Blank single:19197::"yes","no"} Chest pain: {Blank single:19197::"yes","no"} Lower extremity edema: {Blank single:19197::"yes","no"} Dizzy/lightheaded: {Blank single:19197::"yes","no"}  Relevant past medical, surgical, family and social history reviewed and updated as indicated. Interim medical history since our last visit reviewed. Allergies and medications reviewed and updated.  Review of Systems  Per HPI unless specifically indicated above     Objective:    There were no vitals taken for this visit.  Wt Readings from Last 3 Encounters:  04/07/16 201 lb (91.2 kg)  02/18/16 195 lb (88.5 kg)  12/04/15 204 lb 8 oz (92.8 kg)    Physical Exam  Results for orders placed or performed in visit on 04/07/16  Microscopic Examination  Result Value Ref Range   WBC, UA 6-10 (A) 0 - 5 /hpf   RBC, UA 0-2 0 - 2 /hpf   Epithelial Cells (non renal) 0-10 0 - 10 /hpf   Bacteria, UA Few None seen/Few  CBC with Differential/Platelet  Result Value Ref Range   WBC 5.5 3.4 - 10.8 x10E3/uL   RBC 4.55 3.77 - 5.28 x10E6/uL   Hemoglobin 13.7 11.1 - 15.9 g/dL   Hematocrit 36.6 44.0 - 46.6 %   MCV 90 79 - 97 fL   MCH 30.1 26.6 - 33.0 pg   MCHC 33.3 31.5 - 35.7 g/dL   RDW 34.7 42.5 - 95.6 %   Platelets  278 150 - 379 x10E3/uL   Neutrophils 46 %   Lymphs 41 %   Monocytes 8 %   Eos 3 %   Basos 2 %   Neutrophils Absolute 2.6 1.4 - 7.0 x10E3/uL   Lymphocytes Absolute 2.2 0.7 - 3.1 x10E3/uL   Monocytes Absolute 0.4 0.1 - 0.9 x10E3/uL   EOS (ABSOLUTE) 0.1 0.0 - 0.4 x10E3/uL   Basophils Absolute 0.1 0.0 - 0.2 x10E3/uL   Immature Granulocytes 0 %   Immature Grans (Abs) 0.0 0.0 -  0.1 x10E3/uL  Comprehensive metabolic panel  Result Value Ref Range   Glucose 89 65 - 99 mg/dL   BUN 17 6 - 24 mg/dL   Creatinine, Ser 5.03 0.57 - 1.00 mg/dL   GFR calc non Af Amer 96 >59 mL/min/1.73   GFR calc Af Amer 110 >59 mL/min/1.73   BUN/Creatinine Ratio 24 (H) 9 - 23   Sodium 140 134 - 144 mmol/L   Potassium 4.3 3.5 - 5.2 mmol/L   Chloride 98 96 - 106 mmol/L   CO2 26 18 - 29 mmol/L   Calcium 9.7 8.7 - 10.2 mg/dL   Total Protein 7.7 6.0 - 8.5 g/dL   Albumin 4.4 3.5 - 5.5 g/dL   Globulin, Total 3.3 1.5 - 4.5 g/dL   Albumin/Globulin Ratio 1.3 1.2 - 2.2   Bilirubin Total 0.3 0.0 - 1.2 mg/dL   Alkaline Phosphatase 89 39 - 117 IU/L   AST 18 0 - 40 IU/L   ALT 23 0 - 32 IU/L  Lipid Panel w/o Chol/HDL Ratio  Result Value Ref Range   Cholesterol, Total 244 (H) 100 - 199 mg/dL   Triglycerides 546 0 - 149 mg/dL   HDL 60 >56 mg/dL   VLDL Cholesterol Cal 25 5 - 40 mg/dL   LDL Calculated 812 (H) 0 - 99 mg/dL  Microalbumin, Urine Waived  Result Value Ref Range   Microalb, Ur Waived 30 (H) 0 - 19 mg/L   Creatinine, Urine Waived 200 10 - 300 mg/dL   Microalb/Creat Ratio <30 <30 mg/g  TSH  Result Value Ref Range   TSH 4.870 (H) 0.450 - 4.500 uIU/mL  UA/M w/rflx Culture, Routine  Result Value Ref Range   Specific Gravity, UA 1.015 1.005 - 1.030   pH, UA 7.0 5.0 - 7.5   Color, UA Yellow Yellow   Appearance Ur Cloudy (A) Clear   Leukocytes, UA 2+ (A) Negative   Protein, UA 1+ (A) Negative/Trace   Glucose, UA Negative Negative   Ketones, UA Negative Negative   RBC, UA Negative Negative   Bilirubin, UA  Negative Negative   Urobilinogen, Ur 0.2 0.2 - 1.0 mg/dL   Nitrite, UA Negative Negative   Microscopic Examination See below:    Urinalysis Reflex Comment   HIV antibody  Result Value Ref Range   HIV Screen 4th Generation wRfx Non Reactive Non Reactive  Hepatitis C Antibody  Result Value Ref Range   Hep C Virus Ab <0.1 0.0 - 0.9 s/co ratio  Urine Culture, Routine  Result Value Ref Range   Urine Culture, Routine Final report    Urine Culture result 1 No growth   IGP, Aptima HPV, rfx 16/18,45  Result Value Ref Range   DIAGNOSIS: Comment    Specimen adequacy: Comment    CLINICIAN PROVIDED ICD10: Comment    Performed by: Comment    PAP SMEAR COMMENT .    Note: Comment    Test Methodology Comment    HPV Aptima Negative Negative      Assessment & Plan:   Problem List Items Addressed This Visit    None    Visit Diagnoses   None.      Follow up plan: No Follow-up on file.

## 2016-05-14 NOTE — Telephone Encounter (Signed)
meg

## 2016-05-22 ENCOUNTER — Other Ambulatory Visit: Payer: BLUE CROSS/BLUE SHIELD

## 2016-05-22 DIAGNOSIS — E039 Hypothyroidism, unspecified: Secondary | ICD-10-CM | POA: Diagnosis not present

## 2016-05-23 ENCOUNTER — Telehealth: Payer: Self-pay | Admitting: Family Medicine

## 2016-05-23 DIAGNOSIS — E038 Other specified hypothyroidism: Secondary | ICD-10-CM

## 2016-05-23 LAB — THYROID PANEL WITH TSH
Free Thyroxine Index: 3.5 (ref 1.2–4.9)
T3 Uptake Ratio: 38 % (ref 24–39)
T4, Total: 9.2 ug/dL (ref 4.5–12.0)
TSH: 0.262 u[IU]/mL — AB (ref 0.450–4.500)

## 2016-05-23 MED ORDER — LEVOTHYROXINE SODIUM 88 MCG PO TABS: 88.0000 ug | ORAL_TABLET | Freq: Every day | ORAL | 1 refills | Status: DC

## 2016-05-23 NOTE — Telephone Encounter (Signed)
Please let her know that we are over treating her thyroid. I sent a new RX to her pharmacy and we'll recheck in 6 weeks to see how it's doing. Thanks!

## 2016-05-23 NOTE — Telephone Encounter (Signed)
Spoke with patient, she has been taking of her thyroid medication. Verbal from Dr.Johnson that she needs to only take the and recheck in 6weeks.  Lab appointment made.  New prescription cancelled at pharmacy.

## 2016-07-04 ENCOUNTER — Other Ambulatory Visit: Payer: Self-pay

## 2016-07-24 ENCOUNTER — Other Ambulatory Visit: Payer: Self-pay | Admitting: Family Medicine

## 2016-07-26 ENCOUNTER — Other Ambulatory Visit: Payer: Self-pay | Admitting: Family Medicine

## 2016-08-13 DIAGNOSIS — M542 Cervicalgia: Secondary | ICD-10-CM | POA: Diagnosis not present

## 2016-08-13 DIAGNOSIS — M545 Low back pain: Secondary | ICD-10-CM | POA: Diagnosis not present

## 2016-08-13 DIAGNOSIS — M150 Primary generalized (osteo)arthritis: Secondary | ICD-10-CM | POA: Diagnosis not present

## 2016-08-13 DIAGNOSIS — M7062 Trochanteric bursitis, left hip: Secondary | ICD-10-CM | POA: Diagnosis not present

## 2016-08-14 DIAGNOSIS — M19042 Primary osteoarthritis, left hand: Secondary | ICD-10-CM | POA: Diagnosis not present

## 2016-08-14 DIAGNOSIS — M19041 Primary osteoarthritis, right hand: Secondary | ICD-10-CM | POA: Diagnosis not present

## 2016-10-15 ENCOUNTER — Ambulatory Visit: Payer: BLUE CROSS/BLUE SHIELD | Admitting: Family Medicine

## 2016-11-03 ENCOUNTER — Ambulatory Visit (INDEPENDENT_AMBULATORY_CARE_PROVIDER_SITE_OTHER): Payer: BLUE CROSS/BLUE SHIELD | Admitting: Family Medicine

## 2016-11-03 ENCOUNTER — Encounter: Payer: Self-pay | Admitting: Family Medicine

## 2016-11-03 VITALS — BP 131/87 | HR 85 | Temp 98.9°F | Wt 211.6 lb

## 2016-11-03 DIAGNOSIS — M542 Cervicalgia: Secondary | ICD-10-CM | POA: Diagnosis not present

## 2016-11-03 DIAGNOSIS — M1990 Unspecified osteoarthritis, unspecified site: Secondary | ICD-10-CM

## 2016-11-03 DIAGNOSIS — R2232 Localized swelling, mass and lump, left upper limb: Secondary | ICD-10-CM

## 2016-11-03 MED ORDER — CYCLOBENZAPRINE HCL 10 MG PO TABS: 10.0000 mg | ORAL_TABLET | Freq: Every day | ORAL | 0 refills | Status: DC

## 2016-11-03 NOTE — Assessment & Plan Note (Signed)
Appears to have torticollis. Will start her on flexeril at night and will get her into PT. Call with any concerns.

## 2016-11-03 NOTE — Assessment & Plan Note (Signed)
Will have patient get functional assessment. Advised her that we do not do long-term disability. She can contact social services to work on this.

## 2016-11-03 NOTE — Progress Notes (Signed)
BP 131/87 (BP Location: Left Arm, Patient Position: Sitting, Cuff Size: Large)   Pulse 85   Temp 98.9 F (37.2 C)   Wt 211 lb 9.6 oz (96 kg)   SpO2 96%   BMI 36.32 kg/m    Subjective:    Patient ID: Emily [name], female    DOB: 1961-04-30, 56 y.o.   MRN: [id number]  HPI: Emily [name] is a 56 y.o. female  Chief Complaint  Patient presents with  . Breast Mass    Left breast, it started at 3 o'clock, now it seems to have moved to 1 o'clock. Patient states that it is sore to the touch.    LUMP Duration: 2 months Location: L breast Onset: sudden Painful: no Discomfort: yes Status:  Moved under her arm Trauma: no Redness: yes Bruising: no Recent infection: no Swollen lymph nodes: yes Requesting removal: no History of cancer: no Associated signs and symptoms: no  NECK PAIN- concern as she has been out of work. Would like long term disability.  Duration: 6-8 months Treatments attempted: tylenol and heat patches, saw Dr. Baron Sane  tried muscle relaxor without benefit Compliant with recommended treatment: average Relief with NSAIDs?:  no Location:Left Duration: 6-8 months Severity: moderate Quality: pressure and feels like a crick in neck that goes up into her head Frequency: nightly Radiation: headache Aggravating factors: sleeping Alleviating factors: Nothing Weakness:  yes Paresthesias / decreased sensation:  yes  Fevers:  no   Relevant past medical, surgical, family and social history reviewed and updated as indicated. Interim medical history since our last visit reviewed. Allergies and medications reviewed and updated.  Review of Systems  Constitutional: Negative.   Respiratory: Negative.   Cardiovascular: Negative.   Musculoskeletal: Positive for myalgias, neck pain and neck stiffness. Negative for arthralgias, back pain, gait problem and joint swelling.  Psychiatric/Behavioral: Negative.     Per HPI unless specifically indicated above       Objective:    BP 131/87 (BP Location: Left Arm, Patient Position: Sitting, Cuff Size: Large)   Pulse 85   Temp 98.9 F (37.2 C)   Wt 211 lb 9.6 oz (96 kg)   SpO2 96%   BMI 36.32 kg/m   Wt Readings from Last 3 Encounters:  11/03/16 211 lb 9.6 oz (96 kg)  04/07/16 201 lb (91.2 kg)  02/18/16 195 lb (88.5 kg)    Physical Exam  Cardiovascular: Normal rate, regular rhythm, normal heart sounds and intact distal pulses.  Exam reveals no gallop and no friction rub.   No murmur heard. Pulmonary/Chest: Effort normal and breath sounds normal. No respiratory distress. She has no wheezes. She has no rales. She exhibits no tenderness.    Musculoskeletal: She exhibits tenderness. She exhibits no edema or deformity.  SCM spasm on the L  Nursing note and vitals reviewed.   Results for orders placed or performed in visit on 05/22/16  Thyroid Panel With TSH  Result Value Ref Range   TSH 0.262 (L) 0.450 - 4.500 uIU/mL   T4, Total 9.2 4.5 - 12.0 ug/dL   T3 Uptake Ratio 38 24 - 39 %   Free Thyroxine Index 3.5 1.2 - 4.9      Assessment & Plan:   Problem List Items Addressed This Visit      Musculoskeletal and Integument   Arthritis    Will have patient get functional assessment. Advised her that we do not do long-term disability. She can contact social services to work on this.  Relevant Medications   cyclobenzaprine (FLEXERIL) 10 MG tablet     Other   Neck pain    Appears to have torticollis. Will start her on flexeril at night and will get her into PT. Call with any concerns.        Other Visit Diagnoses    Lump of axilla, left    -  Primary   Will get her scheduled for mammogram and ultrasound. Feels like a lymph node. Will have her use heat. Call if not getting better.    Relevant Orders   MM Digital Diagnostic Bilat   US BREAST COMPLETE UNI LEFT INC AXILLA   US BREAST COMPLETE UNI RIGHT INC AXILLA       Follow up plan: Return in about 4 weeks (around 12/01/2016) for  follow up neck pain.

## 2016-11-07 ENCOUNTER — Other Ambulatory Visit: Payer: Self-pay | Admitting: *Deleted

## 2016-11-07 ENCOUNTER — Other Ambulatory Visit: Payer: Self-pay | Admitting: Family Medicine

## 2016-11-07 ENCOUNTER — Telehealth: Payer: Self-pay

## 2016-11-07 ENCOUNTER — Inpatient Hospital Stay
Admission: RE | Admit: 2016-11-07 | Discharge: 2016-11-07 | Disposition: A | Discharge status: Home / Self Care | Payer: Self-pay | Source: Ambulatory Visit | Attending: *Deleted | Admitting: *Deleted

## 2016-11-07 DIAGNOSIS — Z9289 Personal history of other medical treatment: Secondary | ICD-10-CM

## 2016-11-07 DIAGNOSIS — R2232 Localized swelling, mass and lump, left upper limb: Secondary | ICD-10-CM

## 2016-11-07 NOTE — Telephone Encounter (Signed)
Received a voicemail from [place] at [facility], they need new imaging codes added for patients mammogram.  Imaging added.

## 2016-11-10 ENCOUNTER — Encounter: Payer: Self-pay | Admitting: Family Medicine

## 2016-11-10 ENCOUNTER — Ambulatory Visit (INDEPENDENT_AMBULATORY_CARE_PROVIDER_SITE_OTHER): Payer: BLUE CROSS/BLUE SHIELD | Admitting: Family Medicine

## 2016-11-10 ENCOUNTER — Other Ambulatory Visit: Payer: Self-pay

## 2016-11-10 VITALS — BP 110/74 | HR 90 | Temp 99.6°F | Wt 211.0 lb

## 2016-11-10 DIAGNOSIS — R6889 Other general symptoms and signs: Secondary | ICD-10-CM | POA: Diagnosis not present

## 2016-11-10 LAB — VERITOR FLU A/B WAIVED
INFLUENZA B: NEGATIVE
Influenza A: POSITIVE — AB

## 2016-11-10 MED ORDER — OSELTAMIVIR PHOSPHATE 75 MG PO CAPS: 75.0000 mg | ORAL_CAPSULE | Freq: Two times a day (BID) | ORAL | 0 refills | Status: DC

## 2016-11-10 MED ORDER — HYDROCOD POLST-CPM POLST ER 10-8 MG/5ML PO SUER: 5.0000 mL | Freq: Two times a day (BID) | ORAL | 0 refills | Status: DC | PRN

## 2016-11-10 MED ORDER — ONDANSETRON 4 MG PO TBDP: 4.0000 mg | ORAL_TABLET | Freq: Three times a day (TID) | ORAL | 0 refills | Status: DC | PRN

## 2016-11-10 NOTE — Patient Instructions (Signed)
Follow up as needed

## 2016-11-10 NOTE — Progress Notes (Signed)
BP 110/74   Pulse 90   Temp 99.6 F (37.6 C)   Wt 211 lb (95.7 kg)   SpO2 96%   BMI 36.22 kg/m    Subjective:    Patient ID: Emily [name], female    DOB: Sep 13, 1961, 56 y.o.   MRN: [id number]  HPI: Emily [name] is a 56 y.o. female  Chief Complaint  Patient presents with  . URI    x 1 day, fever, body aches, cough, head/chest congstion, sore throat, head ache, runny nose   Patient presents with 1 day of sudden onset fever, chills, body aches, painful cough, congestion, sore throat, rhinorrhea. Husband sick with same symptoms, began about 12 hours earlier for her. Denies ear pain, facial pain, SOB. Taking OTC cold medicines with no relief. Also c/o some intermittent nausea since onset.   Past Medical History:  Diagnosis Date  . Anxiety   . Chest pain    a. 10/2013 Neg ETT; b. 10/2015 Refused MV->Cath: nl cors, nl EF.  Marland Kitchen Depression   . Essential hypertension   . GERD (gastroesophageal reflux disease)   . Hypothyroidism   . Morbid obesity (HCC)   . Myalgia    Social History   Social History  . Marital status: Married    Spouse name: N/A  . Number of children: N/A  . Years of education: N/A   Occupational History  . Not on file.   Social History Main Topics  . Smoking status: Former Smoker    Packs/day: 0.25    Years: 10.00    Types: Cigarettes    Quit date: 07/02/1995  . Smokeless tobacco: Never Used  . Alcohol use No  . Drug use: No  . Sexual activity: Yes    Partners: Male   Other Topics Concern  . Not on file   Social History Narrative  . No narrative on file    Relevant past medical, surgical, family and social history reviewed and updated as indicated. Interim medical history since our last visit reviewed. Allergies and medications reviewed and updated.  Review of Systems  Constitutional: Positive for chills, diaphoresis, fatigue and fever.  HENT: Positive for congestion, rhinorrhea and sore throat.   Eyes: Negative.   Respiratory: Positive  for cough.   Cardiovascular: Negative.   Gastrointestinal: Positive for nausea.  Genitourinary: Negative.   Musculoskeletal: Positive for myalgias.  Neurological: Negative.   Psychiatric/Behavioral: Negative.     Per HPI unless specifically indicated above     Objective:    BP 110/74   Pulse 90   Temp 99.6 F (37.6 C)   Wt 211 lb (95.7 kg)   SpO2 96%   BMI 36.22 kg/m   Wt Readings from Last 3 Encounters:  11/10/16 211 lb (95.7 kg)  11/03/16 211 lb 9.6 oz (96 kg)  04/07/16 201 lb (91.2 kg)    Physical Exam  Constitutional: She is oriented to person, place, and time. She appears well-developed and well-nourished.  HENT:  Head: Atraumatic.  Right Ear: External ear normal.  Left Ear: External ear normal.  Clear drainage present in b/l nares Oropharynx erythematous  Eyes: Conjunctivae are normal. Pupils are equal, round, and reactive to light.  Neck: Normal range of motion. Neck supple.  Cardiovascular: Normal rate and normal heart sounds.   Pulmonary/Chest: Effort normal and breath sounds normal. No respiratory distress.  Musculoskeletal: Normal range of motion.  Neurological: She is alert and oriented to person, place, and time.  Skin: Skin is warm and  dry.  Psychiatric: She has a normal mood and affect. Her behavior is normal.  Nursing note and vitals reviewed.     Assessment & Plan:   Problem List Items Addressed This Visit    None    Visit Diagnoses    influenza a    -  Primary   Will treat with tamiflu, tussionex, and zofran prn. Discussed supportive care. Follow up if no improvement   Relevant Orders   Influenza A & B (STAT)       Follow up plan: Return if symptoms worsen or fail to improve.

## 2016-11-17 ENCOUNTER — Telehealth: Payer: Self-pay

## 2016-11-17 MED ORDER — AZITHROMYCIN 250 MG PO TABS: ORAL_TABLET | ORAL | 0 refills | Status: DC

## 2016-11-17 MED ORDER — HYDROCOD POLST-CPM POLST ER 10-8 MG/5ML PO SUER: 5.0000 mL | Freq: Two times a day (BID) | ORAL | 0 refills | Status: DC | PRN

## 2016-11-17 NOTE — Telephone Encounter (Signed)
Spoke with patient's husband, they are both having persistent productive cough, fatigue, and congestion. Up all night coughing, tussionex helping but she is out. Azithromycin and tussionex sent.

## 2016-11-17 NOTE — Telephone Encounter (Signed)
Patient is not improved, wants to know if something can be called in.

## 2016-11-18 ENCOUNTER — Other Ambulatory Visit: Payer: Self-pay | Admitting: Family Medicine

## 2016-11-18 MED ORDER — HYDROCOD POLST-CPM POLST ER 10-8 MG/5ML PO SUER: 5.0000 mL | Freq: Two times a day (BID) | ORAL | 0 refills | Status: DC | PRN

## 2016-11-26 ENCOUNTER — Telehealth: Payer: Self-pay | Admitting: Family Medicine

## 2016-11-26 NOTE — Telephone Encounter (Signed)
Routing to provider  

## 2016-11-26 NOTE — Telephone Encounter (Signed)
Ok to give note 

## 2016-11-26 NOTE — Telephone Encounter (Signed)
Called to get dates, she was seen 11/10/16, has still been out and unable to attend PT due to flu. They will come by to pick it up this afternoon.

## 2016-12-03 ENCOUNTER — Ambulatory Visit
Admission: RE | Admit: 2016-12-03 | Discharge: 2016-12-03 | Disposition: A | Discharge status: Home / Self Care | Payer: BLUE CROSS/BLUE SHIELD | Source: Ambulatory Visit | Attending: Family Medicine | Admitting: Family Medicine

## 2016-12-03 ENCOUNTER — Other Ambulatory Visit: Payer: Self-pay | Admitting: Family Medicine

## 2016-12-03 DIAGNOSIS — R2232 Localized swelling, mass and lump, left upper limb: Secondary | ICD-10-CM

## 2016-12-03 DIAGNOSIS — R928 Other abnormal and inconclusive findings on diagnostic imaging of breast: Secondary | ICD-10-CM | POA: Diagnosis not present

## 2016-12-03 DIAGNOSIS — N6489 Other specified disorders of breast: Secondary | ICD-10-CM | POA: Diagnosis not present

## 2016-12-07 ENCOUNTER — Other Ambulatory Visit: Payer: Self-pay | Admitting: Family Medicine

## 2016-12-11 ENCOUNTER — Ambulatory Visit: Payer: Self-pay | Admitting: Family Medicine

## 2016-12-17 ENCOUNTER — Ambulatory Visit (INDEPENDENT_AMBULATORY_CARE_PROVIDER_SITE_OTHER): Payer: BLUE CROSS/BLUE SHIELD | Admitting: Family Medicine

## 2016-12-17 ENCOUNTER — Encounter: Payer: Self-pay | Admitting: Family Medicine

## 2016-12-17 DIAGNOSIS — M542 Cervicalgia: Secondary | ICD-10-CM | POA: Diagnosis not present

## 2016-12-17 DIAGNOSIS — E038 Other specified hypothyroidism: Secondary | ICD-10-CM | POA: Diagnosis not present

## 2016-12-17 DIAGNOSIS — M130 Polyarthritis, unspecified: Secondary | ICD-10-CM | POA: Diagnosis not present

## 2016-12-17 DIAGNOSIS — M19042 Primary osteoarthritis, left hand: Secondary | ICD-10-CM | POA: Diagnosis not present

## 2016-12-17 DIAGNOSIS — M47812 Spondylosis without myelopathy or radiculopathy, cervical region: Secondary | ICD-10-CM | POA: Diagnosis not present

## 2016-12-17 DIAGNOSIS — M13841 Other specified arthritis, right hand: Secondary | ICD-10-CM | POA: Diagnosis not present

## 2016-12-17 MED ORDER — VENLAFAXINE HCL ER 75 MG PO CP24: ORAL_CAPSULE | ORAL | 1 refills | Status: DC

## 2016-12-17 NOTE — Progress Notes (Signed)
BP 139/85 (BP Location: Left Arm, Patient Position: Sitting, Cuff Size: Large)   Pulse 82   Temp 99.2 F (37.3 C) (Oral)   Resp 17   Ht 5\' 4"  (1.626 m)   Wt 212 lb (96.2 kg)   SpO2 95%   BMI 36.39 kg/m    Subjective:    Patient ID: Emily [name], female    DOB: 04-11-1961, 56 y.o.   MRN: [id number]  HPI: Emily [name] is a 56 y.o. female  Chief Complaint  Patient presents with  . Neck Pain    f/u   NECK PAIN FOLLOW UP- she feels like the muscle relaxer helped a little bit. She ended up getting sick with the flu after the last time she was here, so she hasn't made it to PT yet. She is going to start it this week or next week.  Status: stable Treatments attempted: rest, ice, heat, APAP, ibuprofen, aleve and muscle relaxer  Compliant with recommended treatment: no Relief with NSAIDs?:  no Location:Left Duration:6-8 months Severity: moderate Quality: pressure and feels like a crick in neck that goes up into her head Frequency: nightly Radiation: headache Aggravating factors: sleeping Alleviating factors: Nothing Weakness:  yes Paresthesias / decreased sensation:  yes  Fevers:  no  HYPOTHYROIDISM Thyroid control status:uncontrolled Satisfied with current treatment? yes Medication side effects: no Medication compliance: fair compliance Recent dose adjustment:no Fatigue: yes Cold intolerance: yes Heat intolerance: no Weight gain: yes Weight loss: no Constipation: yes Diarrhea/loose stools: no Palpitations: yes Lower extremity edema: no Anxiety/depressed mood: yes   Relevant past medical, surgical, family and social history reviewed and updated as indicated. Interim medical history since our last visit reviewed. Allergies and medications reviewed and updated.  Review of Systems  Constitutional: Negative.   Respiratory: Negative.   Cardiovascular: Negative.     Per HPI unless specifically indicated above     Objective:    BP 139/85 (BP Location: Left  Arm, Patient Position: Sitting, Cuff Size: Large)   Pulse 82   Temp 99.2 F (37.3 C) (Oral)   Resp 17   Ht 5\' 4"  (1.626 m)   Wt 212 lb (96.2 kg)   SpO2 95%   BMI 36.39 kg/m   Wt Readings from Last 3 Encounters:  12/17/16 212 lb (96.2 kg)  11/10/16 211 lb (95.7 kg)  11/03/16 211 lb 9.6 oz (96 kg)    Physical Exam  Constitutional: She is oriented to person, place, and time. She appears well-developed and well-nourished. No distress.  HENT:  Head: Normocephalic and atraumatic.  Right Ear: Hearing normal.  Left Ear: Hearing normal.  Nose: Nose normal.  Eyes: Conjunctivae and lids are normal. Right eye exhibits no discharge. Left eye exhibits no discharge. No scleral icterus.  Cardiovascular: Normal rate, regular rhythm, normal heart sounds and intact distal pulses.  Exam reveals no gallop and no friction rub.   No murmur heard. Pulmonary/Chest: Effort normal and breath sounds normal. No respiratory distress. She has no wheezes. She has no rales. She exhibits no tenderness.  Musculoskeletal: Normal range of motion. She exhibits tenderness. She exhibits no edema or deformity.  SCM hypertonic on the L   Neurological: She is alert and oriented to person, place, and time.  Skin: Skin is warm, dry and intact. No rash noted. No erythema. No pallor.  Psychiatric: She has a normal mood and affect. Her speech is normal and behavior is normal. Judgment and thought content normal. Cognition and memory are normal.  Nursing  note and vitals reviewed.   Results for orders placed or performed in visit on 11/10/16  Influenza A & B (STAT)  Result Value Ref Range   Influenza A Positive (A) Negative   Influenza B Negative Negative      Assessment & Plan:   Problem List Items Addressed This Visit      Endocrine   Other specified hypothyroidism    Rechecking levels today. Will adjust dose as needed.         Other   Neck pain    Has not done PT. Will start PT this week and see how she does.  Follow up with rheum and ortho as needed. Will check in in 2 weeks to see how she's doing.           Follow up plan: Return in about 4 weeks (around 01/14/2017) for Follow up BP/Depression/Neck.

## 2016-12-17 NOTE — Patient Instructions (Signed)
[employer] [address] [place], [place]- [id number] [phone]

## 2016-12-17 NOTE — Assessment & Plan Note (Signed)
Has not done PT. Will start PT this week and see how she does. Follow up with rheum and ortho as needed. Will check in in 2 weeks to see how she's doing.

## 2016-12-17 NOTE — Assessment & Plan Note (Signed)
Rechecking levels today. Will adjust dose as needed.

## 2016-12-18 ENCOUNTER — Telehealth: Payer: Self-pay | Admitting: Family Medicine

## 2016-12-18 ENCOUNTER — Telehealth: Payer: Self-pay | Admitting: *Deleted

## 2016-12-18 LAB — TSH: TSH: 11.65 u[IU]/mL — ABNORMAL HIGH (ref 0.450–4.500)

## 2016-12-18 MED ORDER — LEVOTHYROXINE SODIUM 100 MCG PO TABS: 100.0000 ug | ORAL_TABLET | Freq: Every day | ORAL | 1 refills | Status: DC

## 2016-12-18 NOTE — Telephone Encounter (Signed)
Spoke with [employer] Let them know that we do not do Long Term Disability paper work but we could send over info from last 3 office visits. They stated that they would appreciate that and to fax it to #[phone]. I let patient know we would be doing that.

## 2016-12-18 NOTE — Telephone Encounter (Signed)
Please let her know that her thyroid came back underactive. Please check to see if she has been taking her medicine every day? If she has- I'm going to increase her dose. If she hasn't, she needs to take it every day. Either way we'll recheck it in 6 weeks.

## 2016-12-18 NOTE — Telephone Encounter (Signed)
Informed patient to take thyroid med in am before food but also that you will send in a higher dose for her to pharmacy. She will call and schedule a 6 week f/u

## 2016-12-23 DIAGNOSIS — M130 Polyarthritis, unspecified: Secondary | ICD-10-CM | POA: Diagnosis not present

## 2016-12-31 ENCOUNTER — Telehealth: Payer: Self-pay | Admitting: Family Medicine

## 2016-12-31 NOTE — Telephone Encounter (Signed)
-----   Message from Dorcas CarrowMegan P Estefano [place], [place] sent at 12/17/2016 10:01 AM EST ----- Call to check in on her neck

## 2016-12-31 NOTE — Telephone Encounter (Signed)
Called to find out about her neck. Trey PaulaJeff states that she is not feeling any better. She has not done any PT. She saw Dr. Hyacinth MeekerMiller for her hands and he notes that she has DDD in her neck and a bone spur. He is sending her to specialists as below.   Dr. Imelda PillowAlrich for her hands at [facility], unknown doctor for her joints Social security doctor.   Passed on that we hope she feels better soon.

## 2017-01-01 ENCOUNTER — Ambulatory Visit: Payer: BLUE CROSS/BLUE SHIELD | Admitting: Family Medicine

## 2017-01-02 DIAGNOSIS — M13841 Other specified arthritis, right hand: Secondary | ICD-10-CM | POA: Diagnosis not present

## 2017-01-02 DIAGNOSIS — G5602 Carpal tunnel syndrome, left upper limb: Secondary | ICD-10-CM | POA: Diagnosis not present

## 2017-01-02 DIAGNOSIS — G5601 Carpal tunnel syndrome, right upper limb: Secondary | ICD-10-CM | POA: Diagnosis not present

## 2017-01-02 DIAGNOSIS — M19042 Primary osteoarthritis, left hand: Secondary | ICD-10-CM | POA: Diagnosis not present

## 2017-01-03 ENCOUNTER — Other Ambulatory Visit: Payer: Self-pay | Admitting: Family Medicine

## 2017-01-08 DIAGNOSIS — M25549 Pain in joints of unspecified hand: Secondary | ICD-10-CM | POA: Diagnosis not present

## 2017-01-08 DIAGNOSIS — M25542 Pain in joints of left hand: Secondary | ICD-10-CM | POA: Diagnosis not present

## 2017-01-08 DIAGNOSIS — M25561 Pain in right knee: Secondary | ICD-10-CM | POA: Diagnosis not present

## 2017-01-08 DIAGNOSIS — M545 Low back pain: Secondary | ICD-10-CM | POA: Diagnosis not present

## 2017-01-08 DIAGNOSIS — M25541 Pain in joints of right hand: Secondary | ICD-10-CM | POA: Diagnosis not present

## 2017-01-08 DIAGNOSIS — M25562 Pain in left knee: Secondary | ICD-10-CM | POA: Diagnosis not present

## 2017-01-12 ENCOUNTER — Other Ambulatory Visit: Payer: Self-pay | Admitting: Family Medicine

## 2017-01-12 MED ORDER — VENLAFAXINE HCL ER 150 MG PO CP24: 150.0000 mg | ORAL_CAPSULE | Freq: Every day | ORAL | 1 refills | Status: DC

## 2017-01-12 NOTE — Telephone Encounter (Signed)
Please let Emily [name] know that her losartan should be at [facility] so we can see if it needs to be adjusted when I see her on the 12th. Thanks!

## 2017-01-13 ENCOUNTER — Telehealth: Payer: Self-pay | Admitting: *Deleted

## 2017-01-13 MED ORDER — LOSARTAN POTASSIUM 100 MG PO TABS: 100.0000 mg | ORAL_TABLET | Freq: Every day | ORAL | 1 refills | Status: DC

## 2017-01-13 NOTE — Telephone Encounter (Signed)
Pharmacy sent request for 90 day supply of Losartan 100 mg and L-Thyroxine (Synthroid) Tabs 100 MCG

## 2017-01-13 NOTE — Telephone Encounter (Signed)
Losartan sent to her pharmacy. She is due in 2 weeks for a repeat TSH. She cannot have a 90 day supply until we know if it's the right dose as we're adjusting it.

## 2017-01-28 DIAGNOSIS — G5602 Carpal tunnel syndrome, left upper limb: Secondary | ICD-10-CM | POA: Diagnosis not present

## 2017-01-28 DIAGNOSIS — G5601 Carpal tunnel syndrome, right upper limb: Secondary | ICD-10-CM | POA: Diagnosis not present

## 2017-01-29 ENCOUNTER — Other Ambulatory Visit: Payer: Self-pay | Admitting: Family Medicine

## 2017-01-29 ENCOUNTER — Encounter: Payer: Self-pay | Admitting: Family Medicine

## 2017-01-29 ENCOUNTER — Telehealth: Payer: Self-pay

## 2017-01-29 ENCOUNTER — Ambulatory Visit (INDEPENDENT_AMBULATORY_CARE_PROVIDER_SITE_OTHER): Payer: BLUE CROSS/BLUE SHIELD | Admitting: Family Medicine

## 2017-01-29 VITALS — BP 158/90 | HR 76 | Temp 98.9°F | Wt 212.2 lb

## 2017-01-29 DIAGNOSIS — I10 Essential (primary) hypertension: Secondary | ICD-10-CM

## 2017-01-29 DIAGNOSIS — F331 Major depressive disorder, recurrent, moderate: Secondary | ICD-10-CM

## 2017-01-29 DIAGNOSIS — E038 Other specified hypothyroidism: Secondary | ICD-10-CM

## 2017-01-29 DIAGNOSIS — M542 Cervicalgia: Secondary | ICD-10-CM

## 2017-01-29 MED ORDER — VENLAFAXINE HCL ER 150 MG PO CP24: 150.0000 mg | ORAL_CAPSULE | Freq: Every day | ORAL | 1 refills | Status: DC

## 2017-01-29 MED ORDER — HYDROCHLOROTHIAZIDE 25 MG PO TABS: 25.0000 mg | ORAL_TABLET | Freq: Every day | ORAL | 3 refills | Status: DC

## 2017-01-29 MED ORDER — VENLAFAXINE HCL ER 75 MG PO CP24: ORAL_CAPSULE | ORAL | 1 refills | Status: DC

## 2017-01-29 NOTE — Assessment & Plan Note (Signed)
Under good control. Continue current regimen. Continue to monitor.  

## 2017-01-29 NOTE — Assessment & Plan Note (Signed)
Will recheck today and adjust dose as needed. Await results.

## 2017-01-29 NOTE — Telephone Encounter (Signed)
Please send a 90 day supply of the following to [facility]  Venlafaxine  and   Levothyroxine 

## 2017-01-29 NOTE — Assessment & Plan Note (Signed)
Continue to follow with specialist. Call with any concerns.  

## 2017-01-29 NOTE — Telephone Encounter (Signed)
Effexor sent. Levothyroxine not appropriate until we get results back tomorrow.

## 2017-01-29 NOTE — Assessment & Plan Note (Signed)
Not under good control. Will add HCTZ and recheck in 1 month.  

## 2017-01-29 NOTE — Progress Notes (Signed)
BP (!) 158/90   Pulse 76   Temp 98.9 F (37.2 C)   Wt 212 lb 3.2 oz (96.3 kg)   SpO2 95%   BMI 36.42 kg/m    Subjective:    Patient ID: Emily [name], female    DOB: 19-Oct-1961, 56 y.o.   MRN: [id number]  HPI: Emily [name] is a 56 y.o. female  Chief Complaint  Patient presents with  . Hypertension  . Depression  . Neck Pain   DEPRESSION Mood status: better Satisfied with current treatment?: yes Symptom severity: moderate  Duration of current treatment : chronic Side effects: no Medication compliance: excellent compliance Psychotherapy/counseling: no  Previous psychiatric medications: effexor Depressed mood: no Anxious mood: no Anhedonia: no Significant weight loss or gain: no Insomnia: no  Fatigue: no Feelings of worthlessness or guilt: no Impaired concentration/indecisiveness: no Suicidal ideations: no Hopelessness: no Crying spells: no Depression screen [facility] 2/9 01/29/2017 04/07/2016 02/18/2016  Decreased Interest 1 0 3  Down, Depressed, Hopeless 0 2 3  PHQ - 2 Score Altered sleeping - 3 3  Tired, decreased energy - 3 3  Change in appetite - 2 1  Feeling bad or failure about yourself  - 1 3  Trouble concentrating - 3 3  Moving slowly or fidgety/restless - 0 0  Suicidal thoughts - 0 0  PHQ-9 Score - 14 19  Difficult doing work/chores - Somewhat difficult Very difficult   HYPERTENSION Hypertension status: stable  Satisfied with current treatment? no Duration of hypertension: chronic BP monitoring frequency:  not checking BP medication side effects:  no Medication compliance: excellent compliance Previous BP meds: losartan Aspirin: no Recurrent headaches: no Visual changes: no Palpitations: no Dyspnea: no Chest pain: no Lower extremity edema: no Dizzy/lightheaded: no  NECK PAIN FOLLOW UP Diagnosis: DDD in her neck Status: stable Treatments attempted: rest, ice, heat, APAP, ibuprofen, aleve, muscle relaxer, physical therapy and HEP    Compliant with recommended treatment: average Relief with NSAIDs?:  mild Location:Left Duration:chronic Severity: moderate Quality: dull aching Frequency: intermittent Radiation: none Aggravating factors: sleeping Alleviating factors:  moving and being active Weakness:  yes Paresthesias / decreased sensation:  yes  Fevers:  no  HYPOTHYROIDISM Thyroid control status:uncontrolled Satisfied with current treatment? yes Medication side effects: no Medication compliance: excellent compliance Recent dose adjustment:yes Fatigue: yes Cold intolerance: no Heat intolerance: no Weight gain: yes Weight loss: no Constipation: yes Diarrhea/loose stools: no Palpitations: no Lower extremity edema: no Anxiety/depressed mood: yes  Relevant past medical, surgical, family and social history reviewed and updated as indicated. Interim medical history since our last visit reviewed. Allergies and medications reviewed and updated.  Review of Systems  Constitutional: Negative.   Respiratory: Negative.   Cardiovascular: Negative.   Musculoskeletal: Negative.   Psychiatric/Behavioral: Negative.     Per HPI unless specifically indicated above     Objective:    BP (!) 158/90   Pulse 76   Temp 98.9 F (37.2 C)   Wt 212 lb 3.2 oz (96.3 kg)   SpO2 95%   BMI 36.42 kg/m   Wt Readings from Last 3 Encounters:  01/29/17 212 lb 3.2 oz (96.3 kg)  12/17/16 212 lb (96.2 kg)  11/10/16 211 lb (95.7 kg)    Physical Exam  Constitutional: She is oriented to person, place, and time. She appears well-developed and well-nourished. No distress.  HENT:  Head: Normocephalic and atraumatic.  Right Ear: Hearing normal.  Left Ear: Hearing normal.  Nose: Nose normal.  Eyes: Conjunctivae and lids are normal. Right eye exhibits no discharge. Left eye exhibits no discharge. No scleral icterus.  Cardiovascular: Normal rate, regular rhythm, normal heart sounds and intact distal pulses.  Exam reveals no gallop  and no friction rub.   No murmur heard. Pulmonary/Chest: Effort normal and breath sounds normal. No respiratory distress. She has no wheezes. She has no rales. She exhibits no tenderness.  Musculoskeletal: Normal range of motion.  Neurological: She is alert and oriented to person, place, and time.  Skin: Skin is warm, dry and intact. No rash noted. She is not diaphoretic. No erythema. No pallor.  Psychiatric: She has a normal mood and affect. Her speech is normal and behavior is normal. Judgment and thought content normal. Cognition and memory are normal.  Nursing note and vitals reviewed.   Results for orders placed or performed in visit on 12/17/16  TSH  Result Value Ref Range   TSH 11.650 (H) 0.450 - 4.500 uIU/mL      Assessment & Plan:   Problem List Items Addressed This Visit      Cardiovascular and Mediastinum   Hypertension - Primary    Not under good control. Will add HCTZ and recheck in 1 month.       Relevant Medications   hydrochlorothiazide (HYDRODIURIL) 25 MG tablet     Endocrine   Other specified hypothyroidism    Will recheck today and adjust dose as needed. Await results.       Relevant Orders   TSH     Other   Neck pain    Continue to follow with specialist. Call with any concerns.       Depression    Under good control. Continue current regimen. Continue to monitor.           Follow up plan: Return in about 4 weeks (around 02/26/2017) for BP follow up.

## 2017-01-30 ENCOUNTER — Other Ambulatory Visit: Payer: Self-pay | Admitting: Family Medicine

## 2017-01-30 LAB — TSH: TSH: 5.81 u[IU]/mL — ABNORMAL HIGH (ref 0.450–4.500)

## 2017-01-30 MED ORDER — LEVOTHYROXINE SODIUM 112 MCG PO TABS: 112.0000 ug | ORAL_TABLET | Freq: Every day | ORAL | 1 refills | Status: DC

## 2017-02-12 ENCOUNTER — Other Ambulatory Visit: Payer: Self-pay | Admitting: Family Medicine

## 2017-03-02 ENCOUNTER — Encounter: Payer: Self-pay | Admitting: Family Medicine

## 2017-03-02 ENCOUNTER — Ambulatory Visit (INDEPENDENT_AMBULATORY_CARE_PROVIDER_SITE_OTHER): Payer: BLUE CROSS/BLUE SHIELD | Admitting: Family Medicine

## 2017-03-02 VITALS — BP 137/86 | HR 69 | Wt 208.0 lb

## 2017-03-02 DIAGNOSIS — I10 Essential (primary) hypertension: Secondary | ICD-10-CM | POA: Diagnosis not present

## 2017-03-02 DIAGNOSIS — E038 Other specified hypothyroidism: Secondary | ICD-10-CM

## 2017-03-02 MED ORDER — HYDROCHLOROTHIAZIDE 25 MG PO TABS: 25.0000 mg | ORAL_TABLET | Freq: Every day | ORAL | 1 refills | Status: DC

## 2017-03-02 NOTE — Assessment & Plan Note (Signed)
Rechecking levels today. Await results. Call with any concerns.  

## 2017-03-02 NOTE — Assessment & Plan Note (Signed)
Under good control. Will continue current regimen. Continue to monitor. Call with any concerns.  

## 2017-03-02 NOTE — Progress Notes (Signed)
BP 137/86   Pulse 69   Wt 208 lb (94.3 kg)   SpO2 97%   BMI 35.70 kg/m    Subjective:    Patient ID: Emily [name], female    DOB: 05/23/1961, 56 y.o.   MRN: [id number]  HPI: Emily [name] is a 56 y.o. female  Chief Complaint  Patient presents with  . Follow-up  . Hypertension   HYPERTENSION Hypertension status: controlled  Satisfied with current treatment? yes Duration of hypertension: chronic BP monitoring frequency:  not checking BP range:  BP medication side effects:  no Medication compliance: excellent compliance Previous BP meds: losartan- HCTZ Aspirin: no Recurrent headaches: no Visual changes: no Palpitations: no Dyspnea: no Chest pain: no Lower extremity edema: no Dizzy/lightheaded: no  HYPOTHYROIDISM Thyroid control status:better Satisfied with current treatment? no Medication side effects: no Medication compliance: excellent compliance Recent dose adjustment:yes Fatigue: yes Cold intolerance: no Heat intolerance: no Weight gain: no Weight loss: no Constipation: no Diarrhea/loose stools: no Palpitations: no Lower extremity edema: no Anxiety/depressed mood: no  Relevant past medical, surgical, family and social history reviewed and updated as indicated. Interim medical history since our last visit reviewed. Allergies and medications reviewed and updated.  Review of Systems  Constitutional: Negative.   Respiratory: Negative.   Cardiovascular: Negative.   Psychiatric/Behavioral: Negative.     Per HPI unless specifically indicated above     Objective:    BP 137/86   Pulse 69   Wt 208 lb (94.3 kg)   SpO2 97%   BMI 35.70 kg/m   Wt Readings from Last 3 Encounters:  03/02/17 208 lb (94.3 kg)  01/29/17 212 lb 3.2 oz (96.3 kg)  12/17/16 212 lb (96.2 kg)    Physical Exam  Constitutional: She is oriented to person, place, and time. She appears well-developed and well-nourished. No distress.  HENT:  Head: Normocephalic and atraumatic.    Right Ear: Hearing normal.  Left Ear: Hearing normal.  Nose: Nose normal.  Eyes: Conjunctivae and lids are normal. Right eye exhibits no discharge. Left eye exhibits no discharge. No scleral icterus.  Cardiovascular: Normal rate, regular rhythm, normal heart sounds and intact distal pulses.  Exam reveals no gallop and no friction rub.   No murmur heard. Pulmonary/Chest: Effort normal and breath sounds normal. No respiratory distress. She has no wheezes. She has no rales. She exhibits no tenderness.  Musculoskeletal: Normal range of motion.  Neurological: She is alert and oriented to person, place, and time.  Skin: Skin is warm, dry and intact. No rash noted. No erythema. No pallor.  Psychiatric: She has a normal mood and affect. Her speech is normal and behavior is normal. Judgment and thought content normal. Cognition and memory are normal.  Nursing note and vitals reviewed.   Results for orders placed or performed in visit on 01/29/17  TSH  Result Value Ref Range   TSH 5.810 (H) 0.450 - 4.500 uIU/mL      Assessment & Plan:   Problem List Items Addressed This Visit      Cardiovascular and Mediastinum   Hypertension - Primary    Under good control. Will continue current regimen. Continue to monitor. Call with any concerns.       Relevant Medications   hydrochlorothiazide (HYDRODIURIL) 25 MG tablet   Other Relevant Orders   Basic metabolic panel     Endocrine   Other specified hypothyroidism    Rechecking levels today. Await results. Call with any concerns.  Relevant Orders   TSH       Follow up plan: Return in about 6 months (around 09/02/2017) for Physical.

## 2017-03-03 ENCOUNTER — Other Ambulatory Visit: Payer: Self-pay | Admitting: Family Medicine

## 2017-03-03 LAB — BASIC METABOLIC PANEL
BUN / CREAT RATIO: 22 (ref 9–23)
BUN: 14 mg/dL (ref 6–24)
CALCIUM: 9.6 mg/dL (ref 8.7–10.2)
CHLORIDE: 99 mmol/L (ref 96–106)
CO2: 24 mmol/L (ref 18–29)
CREATININE: 0.65 mg/dL (ref 0.57–1.00)
GFR calc non Af Amer: 99 mL/min/{1.73_m2} (ref >59)
GFR, EST AFRICAN AMERICAN: 114 mL/min/{1.73_m2} (ref >59)
Glucose: 92 mg/dL (ref 65–99)
Potassium: 3.7 mmol/L (ref 3.5–5.2)
Sodium: 141 mmol/L (ref 134–144)

## 2017-03-03 LAB — TSH: TSH: 1.48 u[IU]/mL (ref 0.450–4.500)

## 2017-03-03 MED ORDER — LEVOTHYROXINE SODIUM 112 MCG PO TABS: 112.0000 ug | ORAL_TABLET | Freq: Every day | ORAL | 3 refills | Status: DC

## 2017-03-25 ENCOUNTER — Other Ambulatory Visit: Payer: Self-pay | Admitting: Family Medicine

## 2017-03-29 ENCOUNTER — Other Ambulatory Visit: Payer: Self-pay | Admitting: Family Medicine

## 2017-03-30 ENCOUNTER — Other Ambulatory Visit: Payer: Self-pay | Admitting: Family Medicine

## 2017-03-30 MED ORDER — LEVOTHYROXINE SODIUM 112 MCG PO TABS: 112.0000 ug | ORAL_TABLET | Freq: Every day | ORAL | 3 refills | Status: DC

## 2017-05-03 IMAGING — MG MM DIGITAL DIAGNOSTIC BILAT W/ TOMO W/ CAD
8 of 15 series · 8 of 35 positions shown · non-contrast
Comparison: 04/11/2013 and earlier

CLINICAL DATA: Patient reports palpable abnormalities in the
axillary regions bilaterally.

EXAM:
2D DIGITAL DIAGNOSTIC BILATERAL MAMMOGRAM WITH CAD AND ADJUNCT TOMO
ULTRASOUND BILATERAL BREAST

[L AT synth-2D]
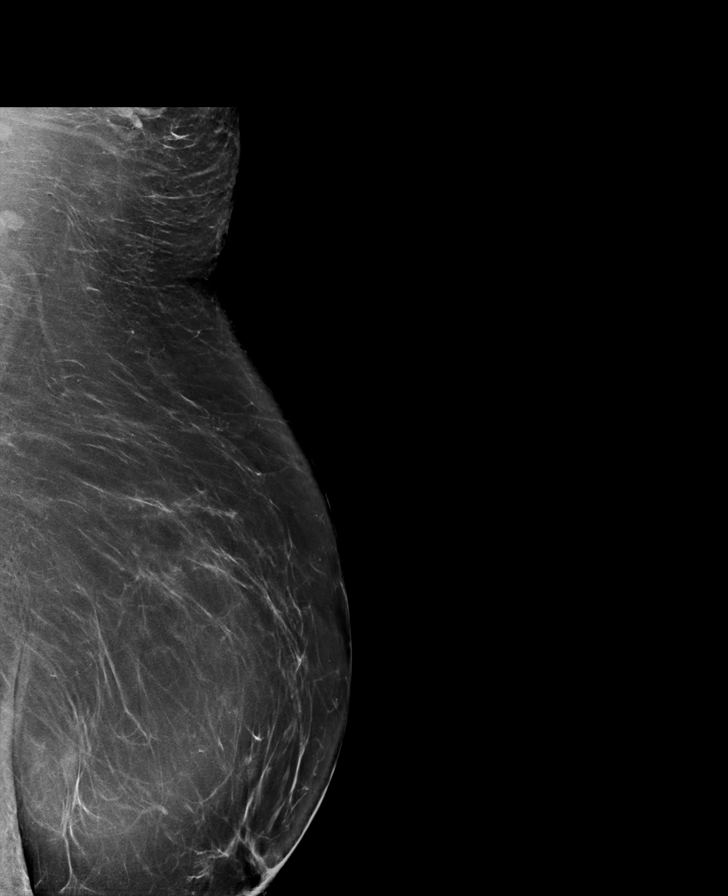

[L CC synth-2D]
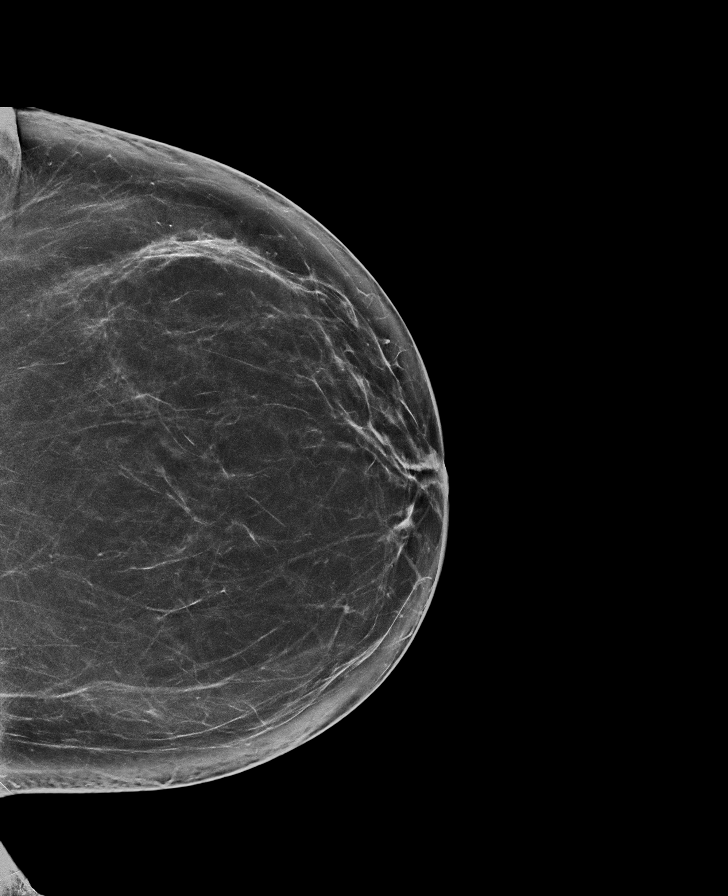

[L AT]
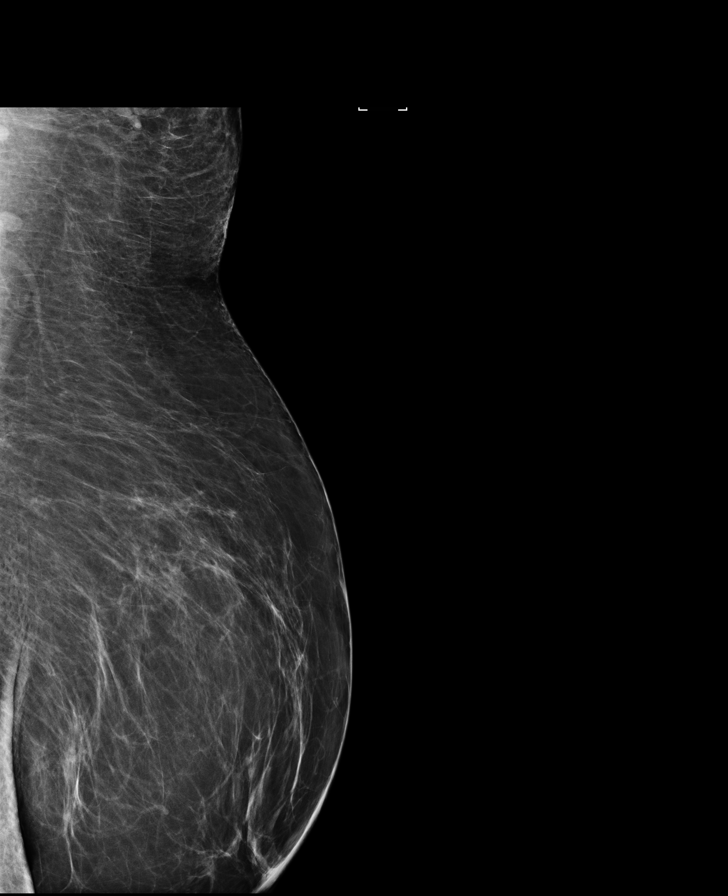

[L CC]
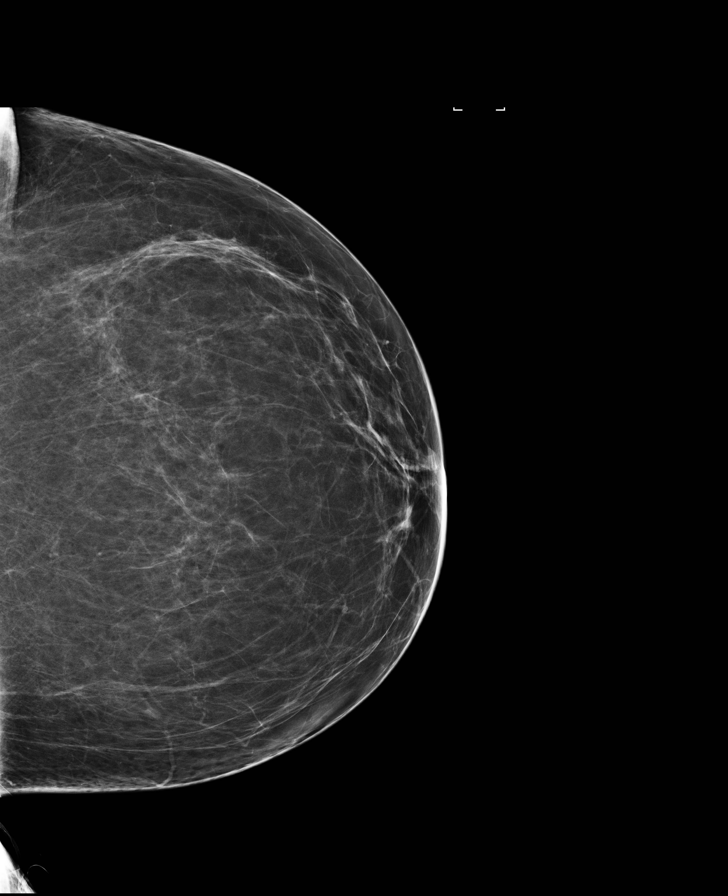

[R MLO]
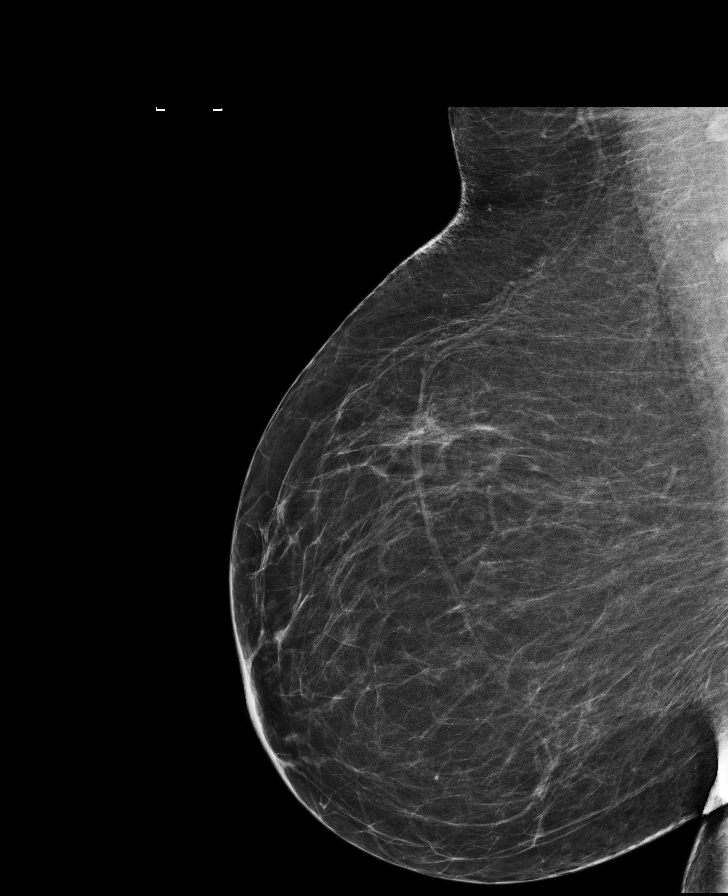

[L MLO synth-2D]
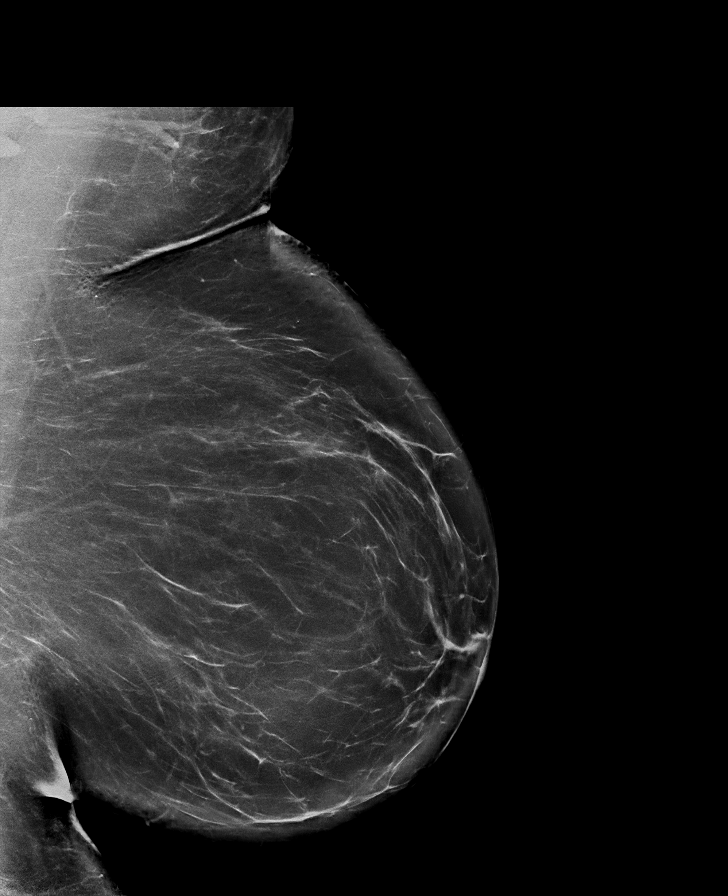

[R MLO synth-2D]
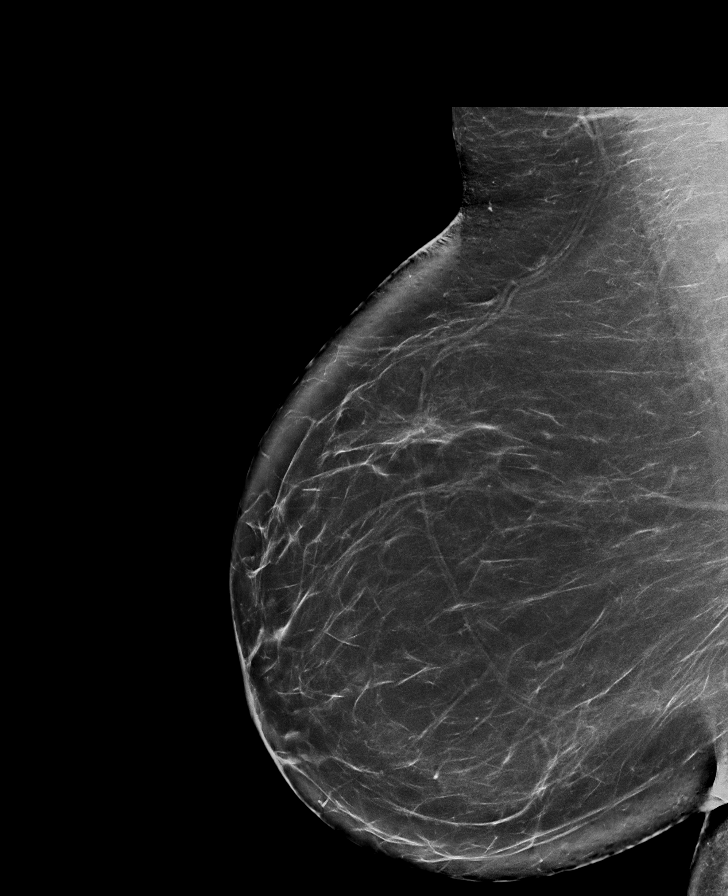

[L MLO]
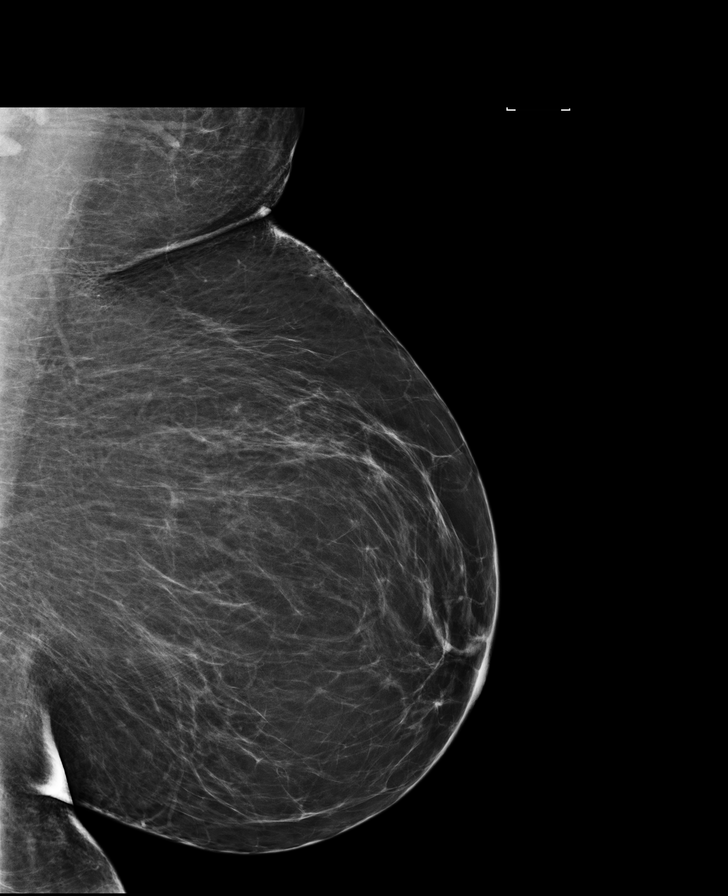

[8 of 35 positions shown; findings below may reference images not displayed]

ACR Breast Density Category b: There are scattered areas of
fibroglandular density.
FINDINGS: No suspicious mass, distortion, or microcalcifications are
identified to suggest presence of malignancy.

Mammographic images were processed with CAD.

On physical exam, within the upper left axilla there is a 1 cm
erythematous papule, tender on physical exam. There is a similar 1
cm erythematous papule in the upper right axilla in the area of
patient's concern.

Targeted ultrasound is performed, showing a superficial hypoechoic
intradermal collection in the upper left axilla, associated with a
sinus tract and measuring 0.4 x 0.3 x 0.2 cm. No underlying
parenchymal abnormality identified.

In the upper right axilla, there is a small superficial hypoechoic
intradermal mass which measures 0.5 x 0.5 x 0.7 cm. No underlying
parenchymal abnormality.
IMPRESSION: 1.  No mammographic evidence for malignancy.
2. Palpable abnormalities in the axillary regions bilaterally are
consistent with intradermal inclusion cyst. No ultrasound evidence
for malignancy.

RECOMMENDATION:
Screening mammogram in one year.(Code:[id number]-[id number])

I have discussed the findings and recommendations with the patient.
Results were also provided in writing at the conclusion of the
visit. If applicable, a reminder letter will be sent to the patient
regarding the next appointment.

BI-RADS CATEGORY  2: Benign.

## 2017-05-07 DIAGNOSIS — G5601 Carpal tunnel syndrome, right upper limb: Secondary | ICD-10-CM | POA: Diagnosis not present

## 2017-05-07 DIAGNOSIS — G5602 Carpal tunnel syndrome, left upper limb: Secondary | ICD-10-CM | POA: Diagnosis not present

## 2017-05-20 DIAGNOSIS — M50323 Other cervical disc degeneration at C6-C7 level: Secondary | ICD-10-CM | POA: Diagnosis not present

## 2017-05-20 DIAGNOSIS — M5033 Other cervical disc degeneration, cervicothoracic region: Secondary | ICD-10-CM | POA: Diagnosis not present

## 2017-05-20 DIAGNOSIS — M47812 Spondylosis without myelopathy or radiculopathy, cervical region: Secondary | ICD-10-CM | POA: Diagnosis not present

## 2017-05-25 DIAGNOSIS — M5416 Radiculopathy, lumbar region: Secondary | ICD-10-CM | POA: Diagnosis not present

## 2017-06-17 DIAGNOSIS — M4722 Other spondylosis with radiculopathy, cervical region: Secondary | ICD-10-CM | POA: Diagnosis not present

## 2017-06-24 DIAGNOSIS — M5412 Radiculopathy, cervical region: Secondary | ICD-10-CM | POA: Diagnosis not present

## 2017-07-10 ENCOUNTER — Telehealth: Payer: Self-pay | Admitting: Family Medicine

## 2017-07-10 MED ORDER — CLONAZEPAM 0.5 MG PO TABS: 0.5000 mg | ORAL_TABLET | Freq: Two times a day (BID) | ORAL | 0 refills | Status: DC | PRN

## 2017-07-10 NOTE — Telephone Encounter (Signed)
Tried calling Emily [name] to let him know this was called in, no answer though. Will try to call again later.

## 2017-07-10 NOTE — Telephone Encounter (Signed)
Prescription called into [facility] for the patient.

## 2017-07-10 NOTE — Telephone Encounter (Signed)
Patients dad passed away last night and she is having a very hard time.  Trey Paula her husband wanted to know if Dr Laural Benes would call her in something to help get her through the next few days.    [facility] in [place] is the pharmacy.  Thank Oneita Kras [phone]

## 2017-07-10 NOTE — Telephone Encounter (Signed)
Please call her klonopin into her pharmacy. Thanks!

## 2017-07-10 NOTE — Telephone Encounter (Signed)
Routing to provider  

## 2017-07-10 NOTE — Telephone Encounter (Signed)
Tried calling husband again with no success. Called and spoke with patient. I apologized for her loss and let her know that we called in a prescription for her to [facility]. Patient thanked [name].

## 2017-07-12 ENCOUNTER — Other Ambulatory Visit: Payer: Self-pay | Admitting: Family Medicine

## 2017-07-17 ENCOUNTER — Other Ambulatory Visit: Payer: Self-pay | Admitting: Family Medicine

## 2017-07-21 ENCOUNTER — Encounter: Payer: Self-pay | Admitting: Family Medicine

## 2017-07-21 ENCOUNTER — Ambulatory Visit (INDEPENDENT_AMBULATORY_CARE_PROVIDER_SITE_OTHER): Payer: BLUE CROSS/BLUE SHIELD | Admitting: Family Medicine

## 2017-07-21 VITALS — BP 173/87 | HR 81 | Temp 99.1°F | Wt 208.1 lb

## 2017-07-21 DIAGNOSIS — Z23 Encounter for immunization: Secondary | ICD-10-CM | POA: Diagnosis not present

## 2017-07-21 DIAGNOSIS — B029 Zoster without complications: Secondary | ICD-10-CM | POA: Diagnosis not present

## 2017-07-21 DIAGNOSIS — F4321 Adjustment disorder with depressed mood: Secondary | ICD-10-CM

## 2017-07-21 MED ORDER — VALACYCLOVIR HCL 1 G PO TABS: 1000.0000 mg | ORAL_TABLET | Freq: Three times a day (TID) | ORAL | 0 refills | Status: DC

## 2017-07-21 NOTE — Patient Instructions (Signed)

## 2017-07-21 NOTE — Progress Notes (Signed)
BP (!) 173/87 (BP Location: Left Arm, Patient Position: Sitting, Cuff Size: Normal)   Pulse 81   Temp 99.1 F (37.3 C)   Wt 208 lb 2 oz (94.4 kg)   SpO2 95%   BMI 35.72 kg/m    Subjective:    Patient ID: Emily [name], female    DOB: 05-Aug-1961, 56 y.o.   MRN: [id number]  HPI: Emily [name] is a 56 y.o. female  Chief Complaint  Patient presents with  . Rash    lower left abdomen   RASH Duration:  2 days  Location: L lower abdomen  Itching: no Burning: yes Redness: yes Oozing: no Scaling: yes Blisters: yes Painful: yes Fevers: no Change in detergents/soaps/personal care products: no Recent illness: no Recent travel:no History of same: no Context: stable Alleviating factors: nothing Treatments attempted:nothing Shortness of breath: no  Throat/tongue swelling: no Myalgias/arthralgias: no  Her dad passed about 2 weeks ago. Has been doing OK, but very sad.   Relevant past medical, surgical, family and social history reviewed and updated as indicated. Interim medical history since our last visit reviewed. Allergies and medications reviewed and updated.  Review of Systems  Constitutional: Negative.   Respiratory: Negative.   Cardiovascular: Negative.   Musculoskeletal: Negative.   Skin: Positive for rash. Negative for color change, pallor and wound.  Psychiatric/Behavioral: Positive for dysphoric mood. Negative for agitation, behavioral problems, confusion, decreased concentration, hallucinations, self-injury, sleep disturbance and suicidal ideas. The patient is not nervous/anxious and is not hyperactive.     Per HPI unless specifically indicated above     Objective:    BP (!) 173/87 (BP Location: Left Arm, Patient Position: Sitting, Cuff Size: Normal)   Pulse 81   Temp 99.1 F (37.3 C)   Wt 208 lb 2 oz (94.4 kg)   SpO2 95%   BMI 35.72 kg/m   Wt Readings from Last 3 Encounters:  07/21/17 208 lb 2 oz (94.4 kg)  03/02/17 208 lb (94.3 kg)  01/29/17 212  lb 3.2 oz (96.3 kg)    Physical Exam  Constitutional: She is oriented to person, place, and time. She appears well-developed and well-nourished. No distress.  HENT:  Head: Normocephalic and atraumatic.  Right Ear: Hearing normal.  Left Ear: Hearing normal.  Nose: Nose normal.  Eyes: Conjunctivae and lids are normal. Right eye exhibits no discharge. Left eye exhibits no discharge. No scleral icterus.  Cardiovascular: Normal rate, regular rhythm, normal heart sounds and intact distal pulses.  Exam reveals no gallop and no friction rub.   No murmur heard. Pulmonary/Chest: Effort normal and breath sounds normal. No respiratory distress. She has no wheezes. She has no rales. She exhibits no tenderness.  Musculoskeletal: Normal range of motion.  Neurological: She is alert and oriented to person, place, and time.  Skin: Skin is warm, dry and intact. Rash (vesicular rash on LLQ of abdomen) noted. She is not diaphoretic. No erythema. No pallor.  Psychiatric: She has a normal mood and affect. Her speech is normal and behavior is normal. Judgment and thought content normal. Cognition and memory are normal.  Nursing note and vitals reviewed.   Results for orders placed or performed in visit on 03/02/17  Basic metabolic panel  Result Value Ref Range   Glucose 92 65 - 99 mg/dL   BUN 14 6 - 24 mg/dL   Creatinine, Ser 7.82 0.57 - 1.00 mg/dL   GFR calc non Af Amer 99 >59 mL/min/1.73   GFR calc Af Denyse Dago  114 >59 mL/min/1.73   BUN/Creatinine Ratio 22 9 - 23   Sodium 141 134 - 144 mmol/L   Potassium 3.7 3.5 - 5.2 mmol/L   Chloride 99 96 - 106 mmol/L   CO2 24 18 - 29 mmol/L   Calcium 9.6 8.7 - 10.2 mg/dL  TSH  Result Value Ref Range   TSH 1.480 0.450 - 4.500 uIU/mL      Assessment & Plan:   Problem List Items Addressed This Visit    None    Visit Diagnoses    Herpes zoster without complication    -  Primary   Will treat with valcycolvir, if pain not better by Friday, will start amitriptyline.     Relevant Medications   valACYclovir (VALTREX) 1000 MG tablet   Grief       Not sure if her medicine is working. Will give her a bit more time. Has a strong support group. Call with concerns. Recheck 6 weeks.    Immunization due       Flu shot given today.   Relevant Orders   Flu Vaccine QUAD 6+ mos PF IM (Fluarix Quad PF) (Completed)       Follow up plan: Return 6 weeks, for follow up mood.

## 2017-07-23 ENCOUNTER — Telehealth: Payer: Self-pay | Admitting: Family Medicine

## 2017-07-23 NOTE — Telephone Encounter (Signed)
Patient would like to get a medication for her pain associated with her shingles. Shingles are spreading on her body but she is unsure of what is cuasing the pain. Patient would like prescription sent to [facility] in [place].   Please Advise.  Thank you

## 2017-07-24 MED ORDER — AMITRIPTYLINE HCL 25 MG PO TABS: 25.0000 mg | ORAL_TABLET | Freq: Every day | ORAL | 2 refills | Status: DC

## 2017-07-24 NOTE — Telephone Encounter (Signed)
It is not spreading, the patient states that it is on her bottom and must have not known, the blisters have burst now and she is having a lot of burning and pain.

## 2017-07-24 NOTE — Telephone Encounter (Signed)
Rx sent to her pharmacy 

## 2017-07-24 NOTE — Telephone Encounter (Signed)
Shingle should not spread... If her rash is spreading, she should be seen.

## 2017-07-24 NOTE — Telephone Encounter (Signed)
Routing to provider  

## 2017-07-24 NOTE — Telephone Encounter (Signed)
Patient notified

## 2017-07-28 ENCOUNTER — Telehealth: Payer: Self-pay | Admitting: Family Medicine

## 2017-07-28 NOTE — Telephone Encounter (Signed)
Shingles doesn't move, she needs to be seen

## 2017-07-28 NOTE — Telephone Encounter (Signed)
Routing to provider  

## 2017-07-28 NOTE — Telephone Encounter (Signed)
Patient states the shingles on her belly are drying up but she is developing new ones on the bend of her left knee. She only has 4 pills left and she also thought Dr Laural Benes was going to call her in some pain ointment medication also but she did not get it.  [facility]  Thank You

## 2017-07-28 NOTE — Telephone Encounter (Signed)
Patient notified, appointment scheduled

## 2017-07-29 ENCOUNTER — Ambulatory Visit (INDEPENDENT_AMBULATORY_CARE_PROVIDER_SITE_OTHER): Payer: BLUE CROSS/BLUE SHIELD | Admitting: Unknown Physician Specialty

## 2017-07-29 ENCOUNTER — Encounter: Payer: Self-pay | Admitting: Unknown Physician Specialty

## 2017-07-29 VITALS — BP 136/81 | HR 84 | Temp 98.4°F | Wt 210.4 lb

## 2017-07-29 DIAGNOSIS — L989 Disorder of the skin and subcutaneous tissue, unspecified: Secondary | ICD-10-CM | POA: Diagnosis not present

## 2017-07-29 DIAGNOSIS — L0291 Cutaneous abscess, unspecified: Secondary | ICD-10-CM | POA: Diagnosis not present

## 2017-07-29 MED ORDER — VALACYCLOVIR HCL 1 G PO TABS: 1000.0000 mg | ORAL_TABLET | Freq: Three times a day (TID) | ORAL | 0 refills | Status: DC

## 2017-07-29 MED ORDER — DOXYCYCLINE HYCLATE 100 MG PO TABS: 100.0000 mg | ORAL_TABLET | Freq: Two times a day (BID) | ORAL | 0 refills | Status: DC

## 2017-07-29 NOTE — Progress Notes (Signed)
BP 136/81   Pulse 84   Temp 98.4 F (36.9 C)   Wt 210 lb 6.4 oz (95.4 kg)   SpO2 90%   BMI 36.12 kg/m    Subjective:    Patient ID: Emily [name], female    DOB: July 24, 1961, 56 y.o.   MRN: [id number]  HPI: Emily [name] is a 56 y.o. female  Chief Complaint  Patient presents with  . Herpes Zoster    pt states she has a place on the back of her left knee that she thinks is shingles. Seen by Dr. Laural Benes 07/21/17 for shingles on her left lower abdomen   Pt diagnosed 10/2 with herpes zoster left abdomen.  She had another vescicular lesion left medial distal thigh.  States she started having pain.  Lesion looks similar to the areas on her abdomen.    Relevant past medical, surgical, family and social history reviewed and updated as indicated. Interim medical history since our last visit reviewed. Allergies and medications reviewed and updated.  Review of Systems  Per HPI unless specifically indicated above     Objective:    BP 136/81   Pulse 84   Temp 98.4 F (36.9 C)   Wt 210 lb 6.4 oz (95.4 kg)   SpO2 90%   BMI 36.12 kg/m   Wt Readings from Last 3 Encounters:  07/29/17 210 lb 6.4 oz (95.4 kg)  07/21/17 208 lb 2 oz (94.4 kg)  03/02/17 208 lb (94.3 kg)    Physical Exam  Constitutional: She is oriented to person, place, and time. She appears well-developed and well-nourished. No distress.  HENT:  Head: Normocephalic and atraumatic.  Eyes: Conjunctivae and lids are normal. Right eye exhibits no discharge. Left eye exhibits no discharge. No scleral icterus.  Cardiovascular: Normal rate.   Pulmonary/Chest: Effort normal.  Abdominal: Normal appearance. There is no splenomegaly or hepatomegaly.  Musculoskeletal: Normal range of motion.  Neurological: She is alert and oriented to person, place, and time.  Skin: Skin is intact.  Healing zoster rash left lower abdomen. Single lesion left inner thigh.  Lesion is a single opaque blister with nickel sized surrounding erythema    Psychiatric: She has a normal mood and affect. Her behavior is normal. Judgment and thought content normal.   After using a betadine and alcohol preparation, Using a #11 blade, lesion lanced and small 1-3 mm incision made.  Expressed a small amount of purulent drainage.    Results for orders placed or performed in visit on 03/02/17  Basic metabolic panel  Result Value Ref Range   Glucose 92 65 - 99 mg/dL   BUN 14 6 - 24 mg/dL   Creatinine, Ser 4.09 0.57 - 1.00 mg/dL   GFR calc non Af Amer 99 >59 mL/min/1.73   GFR calc Af Amer 114 >59 mL/min/1.73   BUN/Creatinine Ratio 22 9 - 23   Sodium 141 134 - 144 mmol/L   Potassium 3.7 3.5 - 5.2 mmol/L   Chloride 99 96 - 106 mmol/L   CO2 24 18 - 29 mmol/L   Calcium 9.6 8.7 - 10.2 mg/dL  TSH  Result Value Ref Range   TSH 1.480 0.450 - 4.500 uIU/mL      Assessment & Plan:   Problem List Items Addressed This Visit    None    Visit Diagnoses    Abscess    -  Primary   Rx for Doxycycline and refill Valtrex.  Await culture results   Relevant Orders  WOUND CULTURE   Skin lesion       ? related ton viral vs bacterial process.  Is not in same dermatome as her zoster.  No PCR swabs but blister lanced and sending for bacterial culture.         Follow up plan: Return if symptoms worsen or fail to improve.

## 2017-08-02 LAB — WOUND CULTURE: ORGANISM ID, BACTERIA: NONE SEEN

## 2017-08-03 ENCOUNTER — Telehealth: Payer: Self-pay | Admitting: Unknown Physician Specialty

## 2017-08-03 NOTE — Telephone Encounter (Signed)
Discussed MRSA infection.  She is on Doxycycline.  Told that any skin infections should be seen ASAP.  Avoid sharing towels

## 2017-08-27 ENCOUNTER — Encounter: Payer: Self-pay | Admitting: Family Medicine

## 2017-08-27 ENCOUNTER — Ambulatory Visit: Payer: BLUE CROSS/BLUE SHIELD | Admitting: Family Medicine

## 2017-08-27 VITALS — BP 129/84 | HR 86 | Temp 99.0°F | Wt 210.2 lb

## 2017-08-27 DIAGNOSIS — B029 Zoster without complications: Secondary | ICD-10-CM | POA: Diagnosis not present

## 2017-08-27 DIAGNOSIS — L219 Seborrheic dermatitis, unspecified: Secondary | ICD-10-CM

## 2017-08-27 DIAGNOSIS — R5383 Other fatigue: Secondary | ICD-10-CM

## 2017-08-27 MED ORDER — KETOCONAZOLE 2 % EX SHAM: 1.0000 "application " | MEDICATED_SHAMPOO | CUTANEOUS | 0 refills | Status: DC

## 2017-08-27 NOTE — Progress Notes (Signed)
BP 129/84 (BP Location: Left Arm, Patient Position: Sitting, Cuff Size: Large)   Pulse 86   Temp 99 F (37.2 C)   Wt 210 lb 4 oz (95.4 kg)   SpO2 98%   BMI 36.09 kg/m    Subjective:    Patient ID: Emily [name], female    DOB: 1961-01-11, 56 y.o.   MRN: [id number]  HPI: Emily [name] is a 56 y.o. female  Chief Complaint  Patient presents with  . Rash    around hair line   Zoster has resolved. Feeling much better.  RASH Duration:  weeks  Location: scalp  Itching: yes Burning: yes Redness: yes Oozing: no Scaling: yes Blisters: no Painful: yes Fevers: no Change in detergents/soaps/personal care products: no Recent illness: yes Recent travel:no History of same: no Context: worse Alleviating factors: nothing Treatments attempted:lotion/moisturizer Shortness of breath: no  Throat/tongue swelling: no Myalgias/arthralgias: no   Still feeling really tired. Dealing with her grief OK. Doesn't want to change her medicine now.   Relevant past medical, surgical, family and social history reviewed and updated as indicated. Interim medical history since our last visit reviewed. Allergies and medications reviewed and updated.  Review of Systems  Constitutional: Positive for fatigue. Negative for activity change, appetite change, chills, diaphoresis, fever and unexpected weight change.  Respiratory: Negative.   Cardiovascular: Negative.   Musculoskeletal: Negative.   Skin: Positive for rash. Negative for color change, pallor and wound.  Psychiatric/Behavioral: Negative.     Per HPI unless specifically indicated above     Objective:    BP 129/84 (BP Location: Left Arm, Patient Position: Sitting, Cuff Size: Large)   Pulse 86   Temp 99 F (37.2 C)   Wt 210 lb 4 oz (95.4 kg)   SpO2 98%   BMI 36.09 kg/m   Wt Readings from Last 3 Encounters:  08/27/17 210 lb 4 oz (95.4 kg)  07/29/17 210 lb 6.4 oz (95.4 kg)  07/21/17 208 lb 2 oz (94.4 kg)    Physical Exam    Constitutional: She is oriented to person, place, and time. She appears well-developed and well-nourished. No distress.  HENT:  Head: Normocephalic and atraumatic.  Right Ear: Hearing normal.  Left Ear: Hearing normal.  Nose: Nose normal.  Eyes: Conjunctivae and lids are normal. Right eye exhibits no discharge. Left eye exhibits no discharge. No scleral icterus.  Cardiovascular: Normal rate, regular rhythm, normal heart sounds and intact distal pulses. Exam reveals no gallop and no friction rub.  No murmur heard. Pulmonary/Chest: Effort normal and breath sounds normal. No respiratory distress. She has no wheezes. She has no rales. She exhibits no tenderness.  Musculoskeletal: Normal range of motion.  Neurological: She is alert and oriented to person, place, and time.  Skin: Skin is warm, dry and intact. Rash (scaley, white and red irritated rash around the hair line) noted. She is not diaphoretic. No erythema. No pallor.  Psychiatric: She has a normal mood and affect. Her speech is normal and behavior is normal. Judgment and thought content normal. Cognition and memory are normal.    Results for orders placed or performed in visit on 07/29/17  WOUND CULTURE  Result Value Ref Range   Gram Stain Result Final report    Organism ID, Bacteria Comment    Organism ID, Bacteria No organisms seen    Aerobic Bacterial Culture Final report (A)    Organism ID, Bacteria Staphylococcus aureus (A)    Antimicrobial Susceptibility Comment  Assessment & Plan:   Problem List Items Addressed This Visit    None    Visit Diagnoses    Seborrheic dermatitis of scalp    -  Primary   Will treat with ketoconazole shampoo. Let us know if not getting better or getting worse.    Other fatigue       Likely due to her grief. Will check labs. She will let us know if she wants us to add wellbutrin. Call with any concerns.    Relevant Orders   CBC with Differential/Platelet   TSH   VITAMIN D 25 Hydroxy  (Vit-D Deficiency, Fractures)   Herpes zoster without complication       Resolved. Call with any concerns.    Relevant Medications   ketoconazole (NIZORAL) 2 % shampoo       Follow up plan: Return As scheduled.

## 2017-08-27 NOTE — Patient Instructions (Addendum)
Seborrheic Dermatitis, Adult Seborrheic dermatitis is a skin disease that causes red, scaly patches. It usually occurs on the scalp, and it is often called dandruff. The patches may appear on other parts of the body. Skin patches tend to appear where there are many oil glands in the skin. Areas of the body that are commonly affected include:  Scalp.  Skin folds of the body.  Ears.  Eyebrows.  Neck.  Face.  Armpits.  The bearded area of men's faces.  The condition may come and go for no known reason, and it is often long-lasting (chronic). What are the causes? The cause of this condition is not known. What increases the risk? This condition is more likely to develop in people who:  Have certain conditions, such as: ? HIV (human immunodeficiency virus). ? AIDS (acquired immunodeficiency syndrome). ? Parkinson disease. ? Mood disorders, such as depression.  Are 40-60 years old.  What are the signs or symptoms? Symptoms of this condition include:  Thick scales on the scalp.  Redness on the face or in the armpits.  Skin that is flaky. The flakes may be white or yellow.  Skin that seems oily or dry but is not helped with moisturizers.  Itching or burning in the affected areas.  How is this diagnosed? This condition is diagnosed with a medical history and physical exam. A sample of your skin may be tested (skin biopsy). You may need to see a skin specialist (dermatologist). How is this treated? There is no cure for this condition, but treatment can help to manage the symptoms. You may get treatment to remove scales, lower the risk of skin infection, and reduce swelling or itching. Treatment may include:  Creams that reduce swelling and irritation (steroids).  Creams that reduce skin yeast.  Medicated shampoo, soaps, moisturizing creams, or ointments.  Medicated moisturizing creams or ointments.  Follow these instructions at home:  Apply over-the-counter and  prescription medicines only as told by your health care provider.  Use any medicated shampoo, soaps, skin creams, or ointments only as told by your health care provider.  Keep all follow-up visits as told by your health care provider. This is important. Contact a health care provider if:  Your symptoms do not improve with treatment.  Your symptoms get worse.  You have new symptoms. This information is not intended to replace advice given to you by your health care provider. Make sure you discuss any questions you have with your health care provider. Document Released: 10/06/2005 Document Revised: 04/25/2016 Document Reviewed: 01/24/2016 Elsevier Interactive Patient Education  2018 [employer]  

## 2017-08-28 LAB — CBC WITH DIFFERENTIAL/PLATELET
BASOS: 1 %
Basophils Absolute: 0.1 10*3/uL (ref 0.0–0.2)
EOS (ABSOLUTE): 0.2 10*3/uL (ref 0.0–0.4)
EOS: 2 %
HEMATOCRIT: 38.9 % (ref 34.0–46.6)
HEMOGLOBIN: 13.4 g/dL (ref 11.1–15.9)
IMMATURE GRANS (ABS): 0 10*3/uL (ref 0.0–0.1)
IMMATURE GRANULOCYTES: 0 %
Lymphocytes Absolute: 3.4 10*3/uL — ABNORMAL HIGH (ref 0.7–3.1)
Lymphs: 41 %
MCH: 31.5 pg (ref 26.6–33.0)
MCHC: 34.4 g/dL (ref 31.5–35.7)
MCV: 92 fL (ref 79–97)
MONOCYTES: 11 %
MONOS ABS: 0.9 10*3/uL (ref 0.1–0.9)
Neutrophils Absolute: 3.7 10*3/uL (ref 1.4–7.0)
Neutrophils: 45 %
Platelets: 272 10*3/uL (ref 150–379)
RBC: 4.25 x10E6/uL (ref 3.77–5.28)
RDW: 14 % (ref 12.3–15.4)
WBC: 8.2 10*3/uL (ref 3.4–10.8)

## 2017-08-28 LAB — TSH: TSH: 1.22 u[IU]/mL (ref 0.450–4.500)

## 2017-08-28 LAB — VITAMIN D 25 HYDROXY (VIT D DEFICIENCY, FRACTURES): VIT D 25 HYDROXY: 26.2 ng/mL — AB (ref 30.0–100.0)

## 2017-09-02 ENCOUNTER — Encounter: Payer: BLUE CROSS/BLUE SHIELD | Admitting: Family Medicine

## 2017-09-29 ENCOUNTER — Other Ambulatory Visit: Payer: Self-pay | Admitting: Family Medicine

## 2017-09-29 DIAGNOSIS — I10 Essential (primary) hypertension: Secondary | ICD-10-CM

## 2017-10-27 DIAGNOSIS — L408 Other psoriasis: Secondary | ICD-10-CM | POA: Diagnosis not present

## 2017-11-05 ENCOUNTER — Encounter: Payer: Self-pay | Admitting: Family Medicine

## 2017-11-05 ENCOUNTER — Ambulatory Visit: Payer: BLUE CROSS/BLUE SHIELD | Admitting: Family Medicine

## 2017-11-05 VITALS — BP 131/83 | HR 84 | Temp 98.9°F | Wt 213.5 lb

## 2017-11-05 DIAGNOSIS — L739 Follicular disorder, unspecified: Secondary | ICD-10-CM | POA: Diagnosis not present

## 2017-11-05 MED ORDER — CHLORHEXIDINE GLUCONATE 4 % EX SOLN: CUTANEOUS | 3 refills | Status: DC

## 2017-11-05 MED ORDER — SULFAMETHOXAZOLE-TRIMETHOPRIM 800-160 MG PO TABS: 1.0000 | ORAL_TABLET | Freq: Two times a day (BID) | ORAL | 0 refills | Status: DC

## 2017-11-05 NOTE — Progress Notes (Signed)
BP 131/83 (BP Location: Left Arm, Patient Position: Sitting, Cuff Size: Large)   Pulse 84   Temp 98.9 F (37.2 C)   Wt 213 lb 8 oz (96.8 kg)   SpO2 94%   BMI 36.65 kg/m    Subjective:    Patient ID: Emily [name], female    DOB: 01/08/1961, 57 y.o.   MRN: [id number]  HPI: Emily [name] is a 57 y.o. female  Chief Complaint  Patient presents with  . Abscess    right underarm   SKIN INFECTION Duration: about a week Location: R underarm History of trauma in area: no Pain: yes Quality: pressure Severity: moderate Redness: yes Swelling: yes Oozing: no Pus: no Fevers: no Nausea/vomiting: yes Status: worse Treatments attempted:none  Tetanus: UTD  Relevant past medical, surgical, family and social history reviewed and updated as indicated. Interim medical history since our last visit reviewed. Allergies and medications reviewed and updated.  Review of Systems  Constitutional: Negative.   Respiratory: Negative.   Cardiovascular: Negative.   Skin: Positive for color change. Negative for pallor, rash and wound.       Lump under R arm, 2 erythematous swollen spots on R collar bone and in R axilla  Psychiatric/Behavioral: Negative.     Per HPI unless specifically indicated above     Objective:    BP 131/83 (BP Location: Left Arm, Patient Position: Sitting, Cuff Size: Large)   Pulse 84   Temp 98.9 F (37.2 C)   Wt 213 lb 8 oz (96.8 kg)   SpO2 94%   BMI 36.65 kg/m   Wt Readings from Last 3 Encounters:  11/05/17 213 lb 8 oz (96.8 kg)  08/27/17 210 lb 4 oz (95.4 kg)  07/29/17 210 lb 6.4 oz (95.4 kg)    Physical Exam  Constitutional: She is oriented to person, place, and time. She appears well-developed and well-nourished. No distress.  HENT:  Head: Normocephalic and atraumatic.  Right Ear: Hearing normal.  Left Ear: Hearing normal.  Nose: Nose normal.  Eyes: Conjunctivae and lids are normal. Right eye exhibits no discharge. Left eye exhibits no discharge. No  scleral icterus.  Cardiovascular: Normal rate, regular rhythm, normal heart sounds and intact distal pulses. Exam reveals no gallop and no friction rub.  No murmur heard. Pulmonary/Chest: Effort normal and breath sounds normal. No respiratory distress. She has no wheezes. She has no rales. She exhibits no tenderness.  Musculoskeletal: Normal range of motion.  Neurological: She is alert and oriented to person, place, and time.  Skin: Skin is warm, dry and intact. No rash noted. There is erythema. No pallor.  Slightly fluctuant, erythematous Lump under R arm, 2 erythematous swollen spots on R collar bone and in R axilla   Psychiatric: She has a normal mood and affect. Her speech is normal and behavior is normal. Judgment and thought content normal. Cognition and memory are normal.  Nursing note and vitals reviewed.   Results for orders placed or performed in visit on 08/27/17  CBC with Differential/Platelet  Result Value Ref Range   WBC 8.2 3.4 - 10.8 x10E3/uL   RBC 4.25 3.77 - 5.28 x10E6/uL   Hemoglobin 13.4 11.1 - 15.9 g/dL   Hematocrit 40.9 81.1 - 46.6 %   MCV 92 79 - 97 fL   MCH 31.5 26.6 - 33.0 pg   MCHC 34.4 31.5 - 35.7 g/dL   RDW 91.4 78.2 - 95.6 %   Platelets 272 150 - 379 x10E3/uL   Neutrophils  45 Not Estab. %   Lymphs 41 Not Estab. %   Monocytes 11 Not Estab. %   Eos 2 Not Estab. %   Basos 1 Not Estab. %   Neutrophils Absolute 3.7 1.4 - 7.0 x10E3/uL   Lymphocytes Absolute 3.4 (H) 0.7 - 3.1 x10E3/uL   Monocytes Absolute 0.9 0.1 - 0.9 x10E3/uL   EOS (ABSOLUTE) 0.2 0.0 - 0.4 x10E3/uL   Basophils Absolute 0.1 0.0 - 0.2 x10E3/uL   Immature Granulocytes 0 Not Estab. %   Immature Grans (Abs) 0.0 0.0 - 0.1 x10E3/uL  TSH  Result Value Ref Range   TSH 1.220 0.450 - 4.500 uIU/mL  VITAMIN D 25 Hydroxy (Vit-D Deficiency, Fractures)  Result Value Ref Range   Vit D, 25-Hydroxy 26.2 (L) 30.0 - 100.0 ng/mL      Assessment & Plan:   Problem List Items Addressed This Visit    None     Visit Diagnoses    Folliculitis    -  Primary   Will treat with bactrim and chlorhexidine. If not getting better, will I&D on Monday.       Follow up plan: Return Monday, for I&D if needed.

## 2017-11-05 NOTE — Patient Instructions (Addendum)

## 2017-11-09 ENCOUNTER — Telehealth: Payer: Self-pay | Admitting: Family Medicine

## 2017-11-09 ENCOUNTER — Ambulatory Visit: Payer: BLUE CROSS/BLUE SHIELD | Admitting: Family Medicine

## 2017-11-09 NOTE — Telephone Encounter (Signed)
Derhonda no-showed appointment 11/09/17 with me for follow up skin infection. Appointment was made 11/06/17. Likely better- if not, needs to get in ASAP for I&D. Thanks!

## 2017-11-10 ENCOUNTER — Other Ambulatory Visit: Payer: Self-pay | Admitting: Family Medicine

## 2017-11-11 NOTE — Telephone Encounter (Signed)
Copied from CRM [id number]. Topic: General - Other >> Nov 11, 2017  1:21 PM Two StrikeHill, [place]iffany A, LPN wrote: Reason for CRM: Called patient due to missed appointment on 11/09/2017, Please reschedule with Dr.Johnson at next available time slot for follow up if patient still needs to be seen.   Please contact [facility] if patient needs transportation assistance.

## 2017-12-21 ENCOUNTER — Telehealth: Payer: Self-pay | Admitting: Family Medicine

## 2017-12-21 MED ORDER — VALACYCLOVIR HCL 1 G PO TABS: 1000.0000 mg | ORAL_TABLET | Freq: Two times a day (BID) | ORAL | 0 refills | Status: DC

## 2017-12-21 NOTE — Telephone Encounter (Signed)
Can you check to see if this is what she wanted? Thanks

## 2017-12-21 NOTE — Telephone Encounter (Signed)
Left message on machine for pt to return call to the office.  

## 2017-12-21 NOTE — Telephone Encounter (Signed)
Spoke with patient. She stated that was correct medication.

## 2017-12-21 NOTE — Telephone Encounter (Signed)
Sent in more valtrex, looks like she's coming in tomorrow to see PCP. Will route back to her for FYI but please call pt and let her know medication was sent in  Copied from CRM [id number]. Topic: General - Other >> Dec 18, 2017  3:24 PM Percival SpanishKennedy, Cheryl W wrote:  Pt husband call to say she is having sores pop up again and is asking if something can be called in   [facility]    >> Dec 18, 2017  4:56 PM Adela PortsWilliamson, Christan M wrote: Fleet ContrasRachel is this something you can look at since Dr Laural BenesJohnson is out? >> Dec 21, 2017 10:34 AM Elliot GaultBell, Tiffany M wrote: Caller name: Mr. [name]  Relation to pt: spouse  Call back number: [phone]  [facility] [address] - [place] - [address]  Reason for call:  Patient scheduled appointment with PCP 12/22/17. Spouse would like a follow up call today due to patient symptoms, please advise

## 2017-12-22 ENCOUNTER — Encounter: Payer: Self-pay | Admitting: Family Medicine

## 2017-12-22 ENCOUNTER — Ambulatory Visit (INDEPENDENT_AMBULATORY_CARE_PROVIDER_SITE_OTHER): Payer: BLUE CROSS/BLUE SHIELD | Admitting: Family Medicine

## 2017-12-22 VITALS — BP 147/86 | HR 81 | Temp 99.0°F | Wt 217.6 lb

## 2017-12-22 DIAGNOSIS — E038 Other specified hypothyroidism: Secondary | ICD-10-CM | POA: Diagnosis not present

## 2017-12-22 DIAGNOSIS — L739 Follicular disorder, unspecified: Secondary | ICD-10-CM | POA: Diagnosis not present

## 2017-12-22 DIAGNOSIS — F331 Major depressive disorder, recurrent, moderate: Secondary | ICD-10-CM | POA: Diagnosis not present

## 2017-12-22 MED ORDER — CHLORHEXIDINE GLUCONATE 4 % EX SOLN: CUTANEOUS | 3 refills | Status: DC

## 2017-12-22 MED ORDER — MUPIROCIN 2 % EX OINT: 1.0000 "application " | TOPICAL_OINTMENT | Freq: Two times a day (BID) | CUTANEOUS | 1 refills | Status: DC

## 2017-12-22 MED ORDER — BUPROPION HCL ER (SR) 150 MG PO TB12: ORAL_TABLET | ORAL | 3 refills | Status: DC

## 2017-12-22 NOTE — Assessment & Plan Note (Signed)
Not under good control. Will add wellbutrin and check in about 3-4 weeks. Checking labs. Await results.

## 2017-12-22 NOTE — Assessment & Plan Note (Signed)
Checking labs. Await results.  

## 2017-12-22 NOTE — Progress Notes (Signed)
BP (!) 147/86 (BP Location: Left Arm, Patient Position: Sitting, Cuff Size: Large)   Pulse 81   Temp 99 F (37.2 C)   Wt 217 lb 9 oz (98.7 kg)   SpO2 96%   BMI 37.34 kg/m    Subjective:    Patient ID: Emily [name], female    DOB: October 30, 1960, [age] y.o.   MRN: [id number]  HPI: Emily [name] is a 57 y.o. female  Chief Complaint  Patient presents with  . skin lesion    under left arm and right side of stomach   RASH Duration:  About a week  Location: generalized  Itching: no Burning: no Redness: yes Oozing: yes Scaling: no Blisters: no Painful: yes Fevers: no Change in detergents/soaps/personal care products: no Recent illness: no Recent travel:no History of same: yes Context: fluctuating Alleviating factors: nothing Treatments attempted:nothing Shortness of breath: no  Throat/tongue swelling: no Myalgias/arthralgias: no  DEPRESSION Mood status: exacerbated Satisfied with current treatment?: no Symptom severity: moderate  Duration of current treatment : chronic Side effects: no Medication compliance: excellent compliance Psychotherapy/counseling: no  Previous psychiatric medications: effexor Depressed mood: yes Anxious mood: yes Anhedonia: yes Significant weight loss or gain: yes Insomnia: yes sleeping all day long- so not sleeping at night Fatigue: yes Feelings of worthlessness or guilt: yes Impaired concentration/indecisiveness: yes Suicidal ideations: no Hopelessness: yes Crying spells: yes [facility] screen [facility] 2/[facility] 01/29/2017 04/07/2016 02/18/2016  Decreased Interest 1 0 3  Down, Depressed, Hopeless 0 2 3  PHQ - 2 Score 1 2 6   Altered sleeping - 3 3  Tired, decreased energy - 3 3  Change in appetite - 2 1  Feeling bad or failure about yourself  - 1 3  Trouble concentrating - 3 3  Moving slowly or fidgety/restless - 0 0  Suicidal thoughts - 0 0  PHQ-9 Score - 14 19  Difficult doing work/chores - Somewhat difficult Very difficult   Relevant past  medical, surgical, family and social history reviewed and updated as indicated. Interim medical history since our last visit reviewed. Allergies and medications reviewed and updated.  Review of Systems  Constitutional: Negative.   Respiratory: Negative.   Cardiovascular: Negative.   Psychiatric/Behavioral: Positive for decreased concentration and dysphoric mood. Negative for agitation, behavioral problems, confusion, hallucinations, self-injury, sleep disturbance and suicidal ideas. The patient is nervous/anxious. The patient is not hyperactive.    Per HPI unless specifically indicated above     Objective:    BP (!) 147/86 (BP Location: Left Arm, Patient Position: Sitting, Cuff Size: Large)   Pulse 81   Temp 99 F (37.2 C)   Wt 217 lb 9 oz (98.7 kg)   SpO2 96%   BMI 37.34 kg/m   Wt Readings from Last 3 Encounters:  12/22/17 217 lb 9 oz (98.7 kg)  11/05/17 213 lb 8 oz (96.8 kg)  08/27/17 210 lb 4 oz (95.4 kg)    Physical Exam  Constitutional: She is oriented to person, place, and time. She appears well-developed and well-nourished. No distress.  HENT:  Head: Normocephalic and atraumatic.  Right Ear: Hearing normal.  Left Ear: Hearing normal.  Nose: Nose normal.  Eyes: Conjunctivae and lids are normal. Right eye exhibits no discharge. Left eye exhibits no discharge. No scleral icterus.  Cardiovascular: Normal rate, regular rhythm, normal heart sounds and intact distal pulses. Exam reveals no gallop and no friction rub.  No murmur heard. Pulmonary/Chest: Effort normal and breath sounds normal. No respiratory distress. She has no  wheezes. She has no rales. She exhibits no tenderness.  Musculoskeletal: Normal range of motion.  Neurological: She is alert and oriented to person, place, and time.  Skin: Skin is warm, dry and intact. No rash noted. She is not diaphoretic. No erythema. No pallor.  Folliculitis under L arm and on R abdomen and R nares  Psychiatric: She has a normal  mood and affect. Her speech is normal and behavior is normal. Judgment and thought content normal. Cognition and memory are normal.  Nursing note and vitals reviewed.   Results for orders placed or performed in visit on 08/27/17  CBC with Differential/Platelet  Result Value Ref Range   WBC 8.2 3.4 - 10.8 x10E3/uL   RBC 4.25 3.77 - 5.28 x10E6/uL   Hemoglobin 13.4 11.1 - 15.9 g/dL   Hematocrit 16.1 09.6 - 46.6 %   MCV 92 79 - 97 fL   MCH 31.5 26.6 - 33.0 pg   MCHC 34.4 31.5 - 35.7 g/dL   RDW 04.5 40.9 - 81.1 %   Platelets 272 150 - 379 x10E3/uL   Neutrophils 45 Not Estab. %   Lymphs 41 Not Estab. %   Monocytes 11 Not Estab. %   Eos 2 Not Estab. %   Basos 1 Not Estab. %   Neutrophils Absolute 3.7 1.4 - 7.0 x10E3/uL   Lymphocytes Absolute 3.4 (H) 0.7 - 3.1 x10E3/uL   Monocytes Absolute 0.9 0.1 - 0.9 x10E3/uL   EOS (ABSOLUTE) 0.2 0.0 - 0.4 x10E3/uL   Basophils Absolute 0.1 0.0 - 0.2 x10E3/uL   Immature Granulocytes 0 Not Estab. %   Immature Grans (Abs) 0.0 0.0 - 0.1 x10E3/uL  TSH  Result Value Ref Range   TSH 1.220 0.450 - 4.500 uIU/mL  VITAMIN D 25 Hydroxy (Vit-D Deficiency, Fractures)  Result Value Ref Range   Vit D, 25-Hydroxy 26.2 (L) 30.0 - 100.0 ng/mL      Assessment & Plan:   Problem List Items Addressed This Visit      Endocrine   Other specified hypothyroidism    Checking labs. Await results.       Relevant Orders   Thyroid Panel With TSH   CBC with Differential/Platelet     Other   Depression - Primary    Not under good control. Will add wellbutrin and check in about 3-4 weeks. Checking labs. Await results.       Relevant Medications   buPROPion (WELLBUTRIN SR) 150 MG 12 hr tablet   Other Relevant Orders   CBC with Differential/Platelet   VITAMIN D 25 Hydroxy (Vit-D Deficiency, Fractures)    Other Visit Diagnoses    Folliculitis       Will treat with chlorhexidine and mupirocin. Call with any concerns.        Follow up plan: Return 3-4 weeks,  for follow up mood.

## 2017-12-23 LAB — CBC WITH DIFFERENTIAL/PLATELET
BASOS ABS: 0 10*3/uL (ref 0.0–0.2)
Basos: 1 %
EOS (ABSOLUTE): 0.2 10*3/uL (ref 0.0–0.4)
EOS: 3 %
HEMATOCRIT: 38.7 % (ref 34.0–46.6)
Hemoglobin: 13.4 g/dL (ref 11.1–15.9)
IMMATURE GRANULOCYTES: 0 %
Immature Grans (Abs): 0 10*3/uL (ref 0.0–0.1)
Lymphocytes Absolute: 3 10*3/uL (ref 0.7–3.1)
Lymphs: 45 %
MCH: 31.5 pg (ref 26.6–33.0)
MCHC: 34.6 g/dL (ref 31.5–35.7)
MCV: 91 fL (ref 79–97)
MONOCYTES: 5 %
MONOS ABS: 0.3 10*3/uL (ref 0.1–0.9)
NEUTROS PCT: 46 %
Neutrophils Absolute: 3.1 10*3/uL (ref 1.4–7.0)
Platelets: 270 10*3/uL (ref 150–379)
RBC: 4.25 x10E6/uL (ref 3.77–5.28)
RDW: 13.7 % (ref 12.3–15.4)
WBC: 6.7 10*3/uL (ref 3.4–10.8)

## 2017-12-23 LAB — VITAMIN D 25 HYDROXY (VIT D DEFICIENCY, FRACTURES): VIT D 25 HYDROXY: 20.5 ng/mL — AB (ref 30.0–100.0)

## 2017-12-23 LAB — THYROID PANEL WITH TSH
FREE THYROXINE INDEX: 2 (ref 1.2–4.9)
T3 Uptake Ratio: 31 % (ref 24–39)
T4 TOTAL: 6.6 ug/dL (ref 4.5–12.0)
TSH: 2.58 u[IU]/mL (ref 0.450–4.500)

## 2017-12-25 ENCOUNTER — Encounter: Payer: Self-pay | Admitting: Family Medicine

## 2018-01-09 ENCOUNTER — Other Ambulatory Visit: Payer: Self-pay | Admitting: Family Medicine

## 2018-01-13 ENCOUNTER — Other Ambulatory Visit: Payer: Self-pay | Admitting: Family Medicine

## 2018-04-01 ENCOUNTER — Other Ambulatory Visit: Payer: Self-pay | Admitting: Family Medicine

## 2018-04-01 DIAGNOSIS — I10 Essential (primary) hypertension: Secondary | ICD-10-CM

## 2018-04-01 NOTE — Telephone Encounter (Signed)
Refill request for levothyroxine 112 mcg tab, expired on 03/30/18 Last TSH  12/22/17 was 2.580 Refill request for hydrochlorothiazide 25 mg tab, LR 09/29/17 for #90tabs and 1 refill  Olevia PerchesMegan Johnson [facility]   LOV  12/22/17 NOV  None scheduled.

## 2018-06-25 DIAGNOSIS — M5412 Radiculopathy, cervical region: Secondary | ICD-10-CM | POA: Diagnosis not present

## 2018-06-25 DIAGNOSIS — M5136 Other intervertebral disc degeneration, lumbar region: Secondary | ICD-10-CM | POA: Diagnosis not present

## 2018-07-04 DIAGNOSIS — R0789 Other chest pain: Secondary | ICD-10-CM | POA: Insufficient documentation

## 2018-07-04 NOTE — Progress Notes (Deleted)
Cardiology Office Note  Date:  07/04/2018   ID:  Emily [name], DOB [birth year], MRN [id number]  PCP:  Emily [name], Emily P, DO   No chief complaint on file.   HPI:  Emily [name] is a 57 yo woman with PMH of  chronic chest pain, SOB Normal cardiac cath 2017 obesity Who presents for follow up of her chest pain and SOB    PMH:   has a past medical history of Anxiety, Chest pain, Depression, Essential hypertension, GERD (gastroesophageal reflux disease), Hypothyroidism, Morbid obesity (HCC), and Myalgia.  PSH:    Past Surgical History:  Procedure Laterality Date  . CARDIAC CATHETERIZATION N/A 11/14/2015   Procedure: Left Heart Cath and Coronary Angiography;  Surgeon: Emily OuchMuhammad A Arida, MD;  Location: [facility];  Service: Cardiovascular;  Laterality: N/A;  . CARPAL TUNNEL RELEASE Left 07/03/2015   Procedure: CARPAL TUNNEL RELEASE;  Surgeon: Emily Rudehristopher Smith, MD;  Location: [facility] ORS;  Service: Orthopedics;  Laterality: Left;  . CARPAL TUNNEL RELEASE Right 09/07/2015   Procedure: CARPAL TUNNEL RELEASE;  Surgeon: Emily Rudehristopher Smith, MD;  Location: [facility] ORS;  Service: Orthopedics;  Laterality: Right;  . CHOLECYSTECTOMY    . KNEE SURGERY Right   . TUBAL LIGATION      Current Outpatient Medications  Medication Sig Dispense Refill  . buPROPion (WELLBUTRIN SR) 150 MG 12 hr tablet Take 1 pill qAM for 1 week, then increase to 1 pill 2x a day after that 60 tablet 3  . Chlorhexidine Gluconate 4 % SOLN 1 application 2-3x a week 473 mL 3  . cholecalciferol (VITAMIN D) 1000 UNITS tablet Take 1,000 Units by mouth daily.    . cyclobenzaprine (FLEXERIL) 10 MG tablet Take 1 tablet (10 mg total) by mouth at bedtime. 30 tablet 0  . hydrochlorothiazide (HYDRODIURIL) 25 MG tablet TAKE 1 TABLET DAILY 90 tablet 1  . ketoconazole (NIZORAL) 2 % shampoo APPLY EXTERNALLY 2 TIMES A WEEK 120 mL 0  . levothyroxine (SYNTHROID, LEVOTHROID) 112 MCG tablet TAKE 1 TABLET DAILY BEFORE BREAKFAST 90 tablet 3  . losartan  (COZAAR) 100 MG tablet TAKE 1 TABLET DAILY 90 tablet 1  . meloxicam (MOBIC) 7.5 MG tablet Take 7.5 mg by mouth daily. Reported on 12/04/2015  3  . mupirocin ointment (BACTROBAN) 2 % Apply 1 application topically 2 (two) times daily. 30 g 1  . Naproxen Sodium (ALEVE) 220 MG CAPS Take 1 capsule by mouth as needed.    . Omega-3 Fatty Acids (FISH OIL PO) Take 1 tablet by mouth daily.    Marland Kitchen. omeprazole (PRILOSEC) 20 MG capsule Take 20 mg by mouth daily as needed.    . ondansetron (ZOFRAN ODT) 4 MG disintegrating tablet Take 1 tablet (4 mg total) by mouth every 8 (eight) hours as needed for nausea or vomiting. 45 tablet 0  . valACYclovir (VALTREX) 1000 MG tablet Take 1 tablet (1,000 mg total) by mouth 2 (two) times daily. 20 tablet 0  . venlafaxine XR (EFFEXOR-XR) 150 MG 24 hr capsule TAKE 1 CAPSULE DAILY WITH BREAKFAST 90 capsule 1  . venlafaxine XR (EFFEXOR-XR) 75 MG 24 hr capsule TAKE 1 CAPSULE DAILY WITH BREAKFAST 90 capsule 1   No current facility-administered medications for this visit.      Allergies:   Patient has no known allergies.   Social History:  The patient  reports that she quit smoking about 23 years ago. Her smoking use included cigarettes. She has a 2.50 pack-year smoking history. She has never used smokeless tobacco.  She reports that she does not drink alcohol or use drugs.   Family History:   family history includes Alzheimer's disease in her father; CAD (age of onset: 52) in her cousin; Cancer in her paternal uncle; Diabetes in her maternal grandmother and paternal grandmother; Heart attack (age of onset: 80) in her father; Heart disease in her father and mother; Heart failure in her mother; Hyperlipidemia in her father; Hypertension in her father and mother; Stroke in her mother.    Review of Systems: ROS   PHYSICAL EXAM: VS:  There were no vitals taken for this visit. , BMI There is no height or weight on file to calculate BMI. GEN: Well nourished, well developed, in no  acute distress HEENT: normal Neck: no JVD, carotid bruits, or masses Cardiac: RRR; no murmurs, rubs, or gallops,no edema  Respiratory:  clear to auscultation bilaterally, normal work of breathing GI: soft, nontender, nondistended, + BS Emily: no deformity or atrophy Skin: warm and dry, no rash Neuro:  Strength and sensation are intact Psych: euthymic mood, full affect    Recent Labs: 12/22/2017: Hemoglobin 13.4; Platelets 270; TSH 2.580    Lipid Panel Lab Results  Component Value Date   CHOL 244 (H) 04/07/2016   HDL 60 04/07/2016   LDLCALC 159 (H) 04/07/2016   TRIG 126 04/07/2016      Wt Readings from Last 3 Encounters:  12/22/17 217 lb 9 oz (98.7 kg)  11/05/17 213 lb 8 oz (96.8 kg)  08/27/17 210 lb 4 oz (95.4 kg)       ASSESSMENT AND PLAN:  No diagnosis found.   Disposition:   F/U  6 months  No orders of the defined types were placed in this encounter.    Signed, Dossie Arbour, M.D., Ph.D. 07/04/2018  [facility] [place], [place] [phone]

## 2018-07-05 ENCOUNTER — Ambulatory Visit: Payer: BLUE CROSS/BLUE SHIELD | Admitting: Cardiovascular Disease

## 2018-07-10 ENCOUNTER — Other Ambulatory Visit: Payer: Self-pay | Admitting: Family Medicine

## 2018-07-12 ENCOUNTER — Other Ambulatory Visit: Payer: Self-pay | Admitting: Family Medicine

## 2018-07-12 NOTE — Telephone Encounter (Signed)
Venlafaxine XR 150 MG capsule refill request  Venlafaxine XR 75 MG capsule refill request LOV 12/22/17  Last ordered 01/13/18 #90 caps with 1 refill PCP Dr. Olevia PerchesMegan Johnson [facility]

## 2018-08-06 ENCOUNTER — Other Ambulatory Visit: Payer: Self-pay | Admitting: Family Medicine

## 2018-08-06 NOTE — Telephone Encounter (Signed)
Appt scheduled 08/13/18.

## 2018-08-06 NOTE — Telephone Encounter (Signed)
Needs follow up appoitment

## 2018-08-11 ENCOUNTER — Other Ambulatory Visit: Payer: Self-pay | Admitting: Family Medicine

## 2018-08-11 MED ORDER — CYCLOBENZAPRINE HCL 10 MG PO TABS: 10.0000 mg | ORAL_TABLET | Freq: Every day | ORAL | 0 refills | Status: DC

## 2018-08-13 ENCOUNTER — Ambulatory Visit: Payer: BLUE CROSS/BLUE SHIELD | Admitting: Family Medicine

## 2018-08-15 DIAGNOSIS — R5381 Other malaise: Secondary | ICD-10-CM | POA: Insufficient documentation

## 2018-08-15 DIAGNOSIS — E6609 Other obesity due to excess calories: Secondary | ICD-10-CM | POA: Insufficient documentation

## 2018-08-15 DIAGNOSIS — Z6834 Body mass index (BMI) 34.0-34.9, adult: Secondary | ICD-10-CM | POA: Insufficient documentation

## 2018-08-15 DIAGNOSIS — Z6837 Body mass index (BMI) 37.0-37.9, adult: Secondary | ICD-10-CM

## 2018-08-15 NOTE — Progress Notes (Signed)
Cardiology Office Note  Date:  08/16/2018   ID:  Brooks, Stotz 04-16-1961, MRN [id number]  PCP:  Dorcas Carrow, DO   Chief Complaint  Patient presents with  . other    OD f/u LS 2/17 c/o fatigue, no energy, left ribcage pain radiates to back and sob. Meds reviewed verbally with pt.    HPI:  [name] is a 57 year old woman with past medical history of Chronic chest pain,  Fatigue, depression Morbid obesity Hypertension Shortness of breath on exertion, deconditioning Cardiac catheterization January 2017 normal coronary arteries Who presents for symptoms of fatigue, shortness of breath  Last seen in clinic February 2017  Reports significant weight gain over the past several years Feels she is deconditioned, no regular exercise  Having chronic Atypical left rib pain radiating around to her left back Some discomfort with movement Does not feel it is from shingles or her heart  EKG personally reviewed by myself on todays visit Shows normal sinus rhythm with rate 89 bpm no significant ST or T wave changes   PMH:   has a past medical history of Anxiety, Chest pain, Depression, Essential hypertension, GERD (gastroesophageal reflux disease), Hypothyroidism, Morbid obesity (HCC), and Myalgia.  PSH:    Past Surgical History:  Procedure Laterality Date  . CARDIAC CATHETERIZATION N/A 11/14/2015   Procedure: Left Heart Cath and Coronary Angiography;  Surgeon: Iran Ouch, MD;  Location: [facility];  Service: Cardiovascular;  Laterality: N/A;  . CARPAL TUNNEL RELEASE Left 07/03/2015   Procedure: CARPAL TUNNEL RELEASE;  Surgeon: Myra Rude, MD;  Location: [facility];  Service: Orthopedics;  Laterality: Left;  . CARPAL TUNNEL RELEASE Right 09/07/2015   Procedure: CARPAL TUNNEL RELEASE;  Surgeon: Myra Rude, MD;  Location: [facility];  Service: Orthopedics;  Laterality: Right;  . CHOLECYSTECTOMY    . KNEE SURGERY Right   . TUBAL LIGATION       Current Outpatient Medications  Medication Sig Dispense Refill  . buPROPion (WELLBUTRIN SR) 150 MG 12 hr tablet TAKE 1 TABLET BY MOUTH EVERY MORNING FOR 1 WEEK. THEN INCREASE TO 1 TABLET TWICE DAILY AFTER THAT 60 tablet 0  . Chlorhexidine Gluconate 4 % SOLN 1 application 2-3x a week (Patient taking differently: 1 application 2-3x a week prn) 473 mL 3  . cholecalciferol (VITAMIN D) 1000 UNITS tablet Take 1,000 Units by mouth daily as needed.     . cyclobenzaprine (FLEXERIL) 10 MG tablet Take 1 tablet (10 mg total) by mouth at bedtime. (Patient taking differently: Take 10 mg by mouth as needed. ) 30 tablet 0  . hydrochlorothiazide (HYDRODIURIL) 25 MG tablet TAKE 1 TABLET DAILY 90 tablet 1  . ketoconazole (NIZORAL) 2 % shampoo APPLY EXTERNALLY 2 TIMES A WEEK (Patient taking differently: as needed. ) 120 mL 0  . levothyroxine (SYNTHROID, LEVOTHROID) 112 MCG tablet TAKE 1 TABLET DAILY BEFORE BREAKFAST 90 tablet 3  . losartan (COZAAR) 100 MG tablet Take 1 tablet (100 mg total) by mouth daily. Please call the office for an appointment for more refills. 90 tablet 0  . meloxicam (MOBIC) 7.5 MG tablet Take 7.5 mg by mouth daily. Reported on 12/04/2015  3  . mupirocin ointment (BACTROBAN) 2 % Apply 1 application topically 2 (two) times daily. (Patient taking differently: Apply 1 application topically as needed. ) 30 g 1  . Naproxen Sodium (ALEVE) 220 MG CAPS Take 1 capsule by mouth as needed.    . Omega-3 Fatty Acids (FISH OIL PO) Take  1 tablet by mouth as needed.     Marland Kitchen omeprazole (PRILOSEC) 20 MG capsule Take 20 mg by mouth daily as needed.    . ondansetron (ZOFRAN ODT) 4 MG disintegrating tablet Take 1 tablet (4 mg total) by mouth every 8 (eight) hours as needed for nausea or vomiting. 45 tablet 0  . valACYclovir (VALTREX) 1000 MG tablet Take 1 tablet (1,000 mg total) by mouth 2 (two) times daily. 20 tablet 0  . venlafaxine XR (EFFEXOR-XR) 150 MG 24 hr capsule TAKE 1 CAPSULE DAILY WITH BREAKFAST 90  capsule 1  . venlafaxine XR (EFFEXOR-XR) 75 MG 24 hr capsule TAKE 1 CAPSULE DAILY WITH BREAKFAST 90 capsule 1  . metoprolol succinate (TOPROL-XL) 25 MG 24 hr tablet Take 1 tablet (25 mg total) by mouth daily. Take with or immediately following a meal. 30 tablet 4   No current facility-administered medications for this visit.      Allergies:   Patient has no known allergies.   Social History:  The patient  reports that she quit smoking about 23 years ago. Her smoking use included cigarettes. She has a 2.50 pack-year smoking history. She has never used smokeless tobacco. She reports that she does not drink alcohol or use drugs.   Family History:   family history includes Alzheimer's disease in her father; CAD (age of onset: 89) in her cousin; Cancer in her paternal uncle; Diabetes in her maternal grandmother and paternal grandmother; Heart attack (age of onset: 60) in her father; Heart disease in her father and mother; Heart failure in her mother; Hyperlipidemia in her father; Hypertension in her father and mother; Stroke in her mother.    Review of Systems: Review of Systems  Constitutional: Positive for malaise/fatigue.  Respiratory: Positive for shortness of breath.   Cardiovascular: Negative.   Gastrointestinal: Negative.   Musculoskeletal: Positive for back pain.  Neurological: Negative.   Psychiatric/Behavioral: Negative.   All other systems reviewed and are negative.   PHYSICAL EXAM: VS:  BP 135/84 (BP Location: Left Arm, Patient Position: Sitting, Cuff Size: Large)   Pulse 89   Ht 5\' 4"  (1.626 m)   Wt 220 lb (99.8 kg)   BMI 37.76 kg/m  , BMI Body mass index is 37.76 kg/m. GEN: Well nourished, well developed, in no acute distress , obese HEENT: normal  Neck: no JVD, carotid bruits, or masses Cardiac: RRR; no murmurs, rubs, or gallops,no edema  Respiratory:  clear to auscultation bilaterally, normal work of breathing GI: soft, nontender, nondistended, + BS MS: no  deformity or atrophy  Skin: warm and dry, no rash Neuro:  Strength and sensation are intact Psych: euthymic mood, full affect  Recent Labs: 12/22/2017: Hemoglobin 13.4; Platelets 270; TSH 2.580    Lipid Panel Lab Results  Component Value Date   CHOL 244 (H) 04/07/2016   HDL 60 04/07/2016   LDLCALC 159 (H) 04/07/2016   TRIG 126 04/07/2016      Wt Readings from Last 3 Encounters:  08/16/18 220 lb (99.8 kg)  12/22/17 217 lb 9 oz (98.7 kg)  11/05/17 213 lb 8 oz (96.8 kg)     ASSESSMENT AND PLAN:  Morbid obesity (HCC) We have encouraged continued exercise, careful diet management in an effort to lose weight. She would like to see the wellness clinic in [place] We can place referral for her if needed, she will let us know My discussion concerning needed dietary changes and need for exercise  Depression, unspecified depression type Likely playing a role  in weight gain, fatigue Physical deconditioning  Essential hypertension - Plan: EKG 12-Lead Blood pressure is well controlled on today's visit. No changes made to the medications.  Hypothyroidism, unspecified type Managed by primary care  Atypical chest pain - Plan: EKG 12-Lead Prior cardiac catheterization with no coronary disease Likely secondary to musculoskeletal issue, Likely has a rib out given radiating pain radiating from anterior to the flank to her back in a particular dermatome.  Less likely shingles given no neurologic issue, more of a grabbing pain like muscle cramp  Disposition:   F/U as needed   Total encounter time more than 25 minutes  Greater than 50% was spent in counseling and coordination of care with the patient    Orders Placed This Encounter  Procedures  . EKG 12-Lead     Signed, Dossie Arbour, M.D., Ph.D. 08/16/2018  [facility] [place], [place] [phone]

## 2018-08-16 ENCOUNTER — Ambulatory Visit: Payer: BLUE CROSS/BLUE SHIELD | Admitting: Cardiovascular Disease

## 2018-08-16 ENCOUNTER — Encounter: Payer: Self-pay | Admitting: Cardiovascular Disease

## 2018-08-16 DIAGNOSIS — F329 Major depressive disorder, single episode, unspecified: Secondary | ICD-10-CM

## 2018-08-16 DIAGNOSIS — R5381 Other malaise: Secondary | ICD-10-CM

## 2018-08-16 DIAGNOSIS — R0789 Other chest pain: Secondary | ICD-10-CM | POA: Diagnosis not present

## 2018-08-16 DIAGNOSIS — F32A Depression, unspecified: Secondary | ICD-10-CM

## 2018-08-16 DIAGNOSIS — I10 Essential (primary) hypertension: Secondary | ICD-10-CM | POA: Diagnosis not present

## 2018-08-16 DIAGNOSIS — E039 Hypothyroidism, unspecified: Secondary | ICD-10-CM

## 2018-08-16 MED ORDER — METOPROLOL SUCCINATE ER 25 MG PO TB24: 25.0000 mg | ORAL_TABLET | Freq: Every day | ORAL | 4 refills | Status: DC

## 2018-08-16 NOTE — Patient Instructions (Addendum)
Medication Instructions:  Please try metoprolol one a day for tachycardia  If you need a refill on your cardiac medications before your next appointment, please call your pharmacy.    Lab work: No new labs needed   If you have labs (blood work) drawn today and your tests are completely normal, you will receive your results only by: Marland Kitchen MyChart Message (if you have MyChart) OR . A paper copy in the mail If you have any lab test that is abnormal or we need to change your treatment, we will call you to review the results.   Testing/Procedures: No new testing needed   Follow-Up: At [facility], you and your health needs are our priority.  As part of our continuing mission to provide you with exceptional heart care, we have created designated Provider Care Teams.  These Care Teams include your primary Cardiologist (physician) and Advanced Practice Providers (APPs -  Physician Assistants and Nurse Practitioners) who all work together to provide you with the care you need, when you need it. . You will need a follow up appointment as needed .   Please call our office 2 months in advance to schedule this appointment.    . Providers on your designated Care Team:   . Nicolasa Ducking, NP . Eula Listen, PA-C . Marisue Ivan, PA-C  Any Other Special Instructions Will Be Listed Below (If Applicable).  For educational health videos Log in to : [email or web address] Or : [email or web address], password : [email or web address]

## 2018-08-24 ENCOUNTER — Ambulatory Visit: Payer: BLUE CROSS/BLUE SHIELD | Admitting: Family Medicine

## 2018-08-26 ENCOUNTER — Ambulatory Visit: Payer: BLUE CROSS/BLUE SHIELD | Admitting: Family Medicine

## 2018-08-26 ENCOUNTER — Encounter: Payer: Self-pay | Admitting: Family Medicine

## 2018-08-26 VITALS — BP 120/78 | HR 74 | Temp 98.9°F | Ht 64.0 in | Wt 222.2 lb

## 2018-08-26 DIAGNOSIS — E039 Hypothyroidism, unspecified: Secondary | ICD-10-CM

## 2018-08-26 DIAGNOSIS — I10 Essential (primary) hypertension: Secondary | ICD-10-CM

## 2018-08-26 DIAGNOSIS — R5382 Chronic fatigue, unspecified: Secondary | ICD-10-CM

## 2018-08-26 DIAGNOSIS — Z13220 Encounter for screening for lipoid disorders: Secondary | ICD-10-CM

## 2018-08-26 DIAGNOSIS — Z23 Encounter for immunization: Secondary | ICD-10-CM | POA: Diagnosis not present

## 2018-08-26 DIAGNOSIS — F331 Major depressive disorder, recurrent, moderate: Secondary | ICD-10-CM

## 2018-08-26 MED ORDER — VENLAFAXINE HCL ER 150 MG PO CP24: ORAL_CAPSULE | ORAL | 1 refills | Status: DC

## 2018-08-26 MED ORDER — CYCLOBENZAPRINE HCL 10 MG PO TABS: 10.0000 mg | ORAL_TABLET | Freq: Every day | ORAL | 3 refills | Status: DC

## 2018-08-26 MED ORDER — VENLAFAXINE HCL ER 75 MG PO CP24: ORAL_CAPSULE | ORAL | 1 refills | Status: DC

## 2018-08-26 MED ORDER — HYDROCHLOROTHIAZIDE 25 MG PO TABS: 25.0000 mg | ORAL_TABLET | Freq: Every day | ORAL | 1 refills | Status: DC

## 2018-08-26 MED ORDER — LOSARTAN POTASSIUM 100 MG PO TABS: 100.0000 mg | ORAL_TABLET | Freq: Every day | ORAL | 1 refills | Status: DC

## 2018-08-26 MED ORDER — METOPROLOL SUCCINATE ER 25 MG PO TB24: 25.0000 mg | ORAL_TABLET | Freq: Every day | ORAL | 1 refills | Status: DC

## 2018-08-26 NOTE — Progress Notes (Signed)
BP 120/78 (BP Location: Left Arm, Patient Position: Sitting, Cuff Size: Normal)   Pulse 74   Temp 98.9 F (37.2 C) (Oral)   Ht 5\' 4"  (1.626 m)   Wt 222 lb 3.2 oz (100.8 kg)   SpO2 92%   BMI 38.14 kg/m    Subjective:    Patient ID: Emily [name], female    DOB: 1961-06-08, 57 y.o.   MRN: [id number]  HPI: Emily [name] is a 57 y.o. female  Chief Complaint  Patient presents with  . Medication Management    Depression Medications  . Medication Refill    Cyclobenazprine   HYPOTHYROIDISM Thyroid control status:stable Satisfied with current treatment? yes Medication side effects: no Medication compliance: excellent compliance Recent dose adjustment:no Fatigue: yes Cold intolerance: no Heat intolerance: no Weight gain: no Weight loss: no Constipation: no Diarrhea/loose stools: no Palpitations: yes Lower extremity edema: no Anxiety/depressed mood: yes  HYPERTENSION Hypertension status: controlled  Satisfied with current treatment? yes Duration of hypertension: chronic BP monitoring frequency:  not checking BP medication side effects:  no Medication compliance: excellent compliance Previous BP meds: HCTZ, losartan, metoprolol Aspirin: no Recurrent headaches: no Visual changes: no Palpitations: no Dyspnea: no Chest pain: no Lower extremity edema: no Dizzy/lightheaded: no  DEPRESSION- no better. Feels like medicine is not working. Would like to try coming off some of her medicine.  Mood status: stable Satisfied with current treatment?: no Symptom severity: moderate  Duration of current treatment : chronic Side effects: no Medication compliance: good compliance Psychotherapy/counseling: no  Depressed mood: yes Anxious mood: yes Anhedonia: yes Significant weight loss or gain: no Insomnia: no  Fatigue: yes Feelings of worthlessness or guilt: yes Impaired concentration/indecisiveness: yes Suicidal ideations: no Hopelessness: yes Crying spells:  yes Depression screen [facility] 2/9 08/26/2018 01/29/2017 04/07/2016 02/18/2016  Decreased Interest 2 1 0 3  Down, Depressed, Hopeless 2 0 2 3  PHQ - 2 Score 4 1 2 6   Altered sleeping 2 - 3 3  Tired, decreased energy 3 - 3 3  Change in appetite 1 - 2 1  Feeling bad or failure about yourself  1 - 1 3  Trouble concentrating 2 - 3 3  Moving slowly or fidgety/restless 0 - 0 0  Suicidal thoughts 0 - 0 0  PHQ-9 Score 13 - 14 19  Difficult doing work/chores Very difficult - Somewhat difficult Very difficult    Relevant past medical, surgical, family and social history reviewed and updated as indicated. Interim medical history since our last visit reviewed. Allergies and medications reviewed and updated.  Review of Systems  Constitutional: Positive for fatigue. Negative for activity change, appetite change, chills, diaphoresis, fever and unexpected weight change.  Respiratory: Negative.   Cardiovascular: Negative.   Musculoskeletal: Negative.   Skin: Negative.   Neurological: Negative.   Psychiatric/Behavioral: Positive for dysphoric mood. Negative for agitation, behavioral problems, confusion, decreased concentration, hallucinations, self-injury, sleep disturbance and suicidal ideas. The patient is nervous/anxious. The patient is not hyperactive.     Per HPI unless specifically indicated above     Objective:    BP 120/78 (BP Location: Left Arm, Patient Position: Sitting, Cuff Size: Normal)   Pulse 74   Temp 98.9 F (37.2 C) (Oral)   Ht 5\' 4"  (1.626 m)   Wt 222 lb 3.2 oz (100.8 kg)   SpO2 92%   BMI 38.14 kg/m   Wt Readings from Last 3 Encounters:  08/26/18 222 lb 3.2 oz (100.8 kg)  08/16/18 220 lb (99.8  kg)  12/22/17 217 lb 9 oz (98.7 kg)    Physical Exam  Constitutional: She is oriented to person, place, and time. She appears well-developed and well-nourished. No distress.  HENT:  Head: Normocephalic and atraumatic.  Right Ear: Hearing normal.  Left Ear: Hearing normal.  Nose:  Nose normal.  Eyes: Conjunctivae and lids are normal. Right eye exhibits no discharge. Left eye exhibits no discharge. No scleral icterus.  Cardiovascular: Normal rate, regular rhythm, normal heart sounds and intact distal pulses. Exam reveals no gallop and no friction rub.  No murmur heard. Pulmonary/Chest: Effort normal and breath sounds normal. No stridor. No respiratory distress. She has no wheezes. She has no rales. She exhibits no tenderness.  Musculoskeletal: Normal range of motion.  Neurological: She is alert and oriented to person, place, and time.  Skin: Skin is warm, dry and intact. Capillary refill takes less than 2 seconds. No rash noted. She is not diaphoretic. No erythema. No pallor.  Psychiatric: She has a normal mood and affect. Her speech is normal and behavior is normal. Judgment and thought content normal. Cognition and memory are normal.  Nursing note and vitals reviewed.   Results for orders placed or performed in visit on 12/22/17  Thyroid Panel With TSH  Result Value Ref Range   TSH 2.580 0.450 - 4.500 uIU/mL   T4, Total 6.6 4.5 - 12.0 ug/dL   T3 Uptake Ratio 31 24 - 39 %   Free Thyroxine Index 2.0 1.2 - 4.9  CBC with Differential/Platelet  Result Value Ref Range   WBC 6.7 3.4 - 10.8 x10E3/uL   RBC 4.25 3.77 - 5.28 x10E6/uL   Hemoglobin 13.4 11.1 - 15.9 g/dL   Hematocrit 40.9 81.1 - 46.6 %   MCV 91 79 - 97 fL   MCH 31.5 26.6 - 33.0 pg   MCHC 34.6 31.5 - 35.7 g/dL   RDW 91.4 78.2 - 95.6 %   Platelets 270 150 - 379 x10E3/uL   Neutrophils 46 Not Estab. %   Lymphs 45 Not Estab. %   Monocytes 5 Not Estab. %   Eos 3 Not Estab. %   Basos 1 Not Estab. %   Neutrophils Absolute 3.1 1.4 - 7.0 x10E3/uL   Lymphocytes Absolute 3.0 0.7 - 3.1 x10E3/uL   Monocytes Absolute 0.3 0.1 - 0.9 x10E3/uL   EOS (ABSOLUTE) 0.2 0.0 - 0.4 x10E3/uL   Basophils Absolute 0.0 0.0 - 0.2 x10E3/uL   Immature Granulocytes 0 Not Estab. %   Immature Grans (Abs) 0.0 0.0 - 0.1 x10E3/uL   VITAMIN D 25 Hydroxy (Vit-D Deficiency, Fractures)  Result Value Ref Range   Vit D, 25-Hydroxy 20.5 (L) 30.0 - 100.0 ng/mL      Assessment & Plan:   Problem List Items Addressed This Visit      Cardiovascular and Mediastinum   Essential hypertension    Under good control on current regimen. Continue current regimen. Continue to monitor. Call with any concerns. Refills given.        Relevant Medications   hydrochlorothiazide (HYDRODIURIL) 25 MG tablet   losartan (COZAAR) 100 MG tablet   metoprolol succinate (TOPROL-XL) 25 MG 24 hr tablet   Other Relevant Orders   Microalbumin, Urine Waived   UA/M w/rflx Culture, Routine     Endocrine   Hypothyroidism    Rechecking levels today. Await results. Will treat as needed. Call with any concerns.       Relevant Medications   metoprolol succinate (TOPROL-XL) 25 MG 24 hr  tablet     Other   Depression - Primary    Stable, but not feeling better. Will wean off wellbutrin and recheck 1 month to see how she's doing. Call with any concerns.       Relevant Medications   venlafaxine XR (EFFEXOR-XR) 150 MG 24 hr capsule   venlafaxine XR (EFFEXOR-XR) 75 MG 24 hr capsule   Morbid obesity (HCC)    Encouraged weight loss of 1-2lbs per week. HTN and Knee arthritis. Call with any concerns.       Relevant Orders   Bayer DCA Hb A1c Waived    Other Visit Diagnoses    Need for influenza vaccination       Flu shot given today.   Relevant Orders   Flu Vaccine QUAD 6+ mos PF IM (Fluarix Quad PF) (Completed)   Chronic fatigue       Checking labs and will get sleep study. Referral generated today. Call with any concerns.    Relevant Orders   Ambulatory referral to Sleep Studies   CBC with Differential/Platelet   Comprehensive metabolic panel   TSH   Screening for cholesterol level       Labs drawn today. Await results.    Relevant Orders   Lipid Panel w/o Chol/HDL Ratio       Follow up plan: Return in about 4 weeks (around 09/23/2018)  for Physical.

## 2018-08-26 NOTE — Assessment & Plan Note (Signed)
Encouraged weight loss of 1-2lbs per week. HTN and Knee arthritis. Call with any concerns.

## 2018-08-26 NOTE — Assessment & Plan Note (Signed)
Stable, but not feeling better. Will wean off wellbutrin and recheck 1 month to see how she's doing. Call with any concerns.

## 2018-08-26 NOTE — Assessment & Plan Note (Signed)
Under good control on current regimen. Continue current regimen. Continue to monitor. Call with any concerns. Refills given.   

## 2018-08-26 NOTE — Assessment & Plan Note (Signed)
Rechecking levels today. Await results. Will treat as needed. Call with any concerns.  

## 2018-08-26 NOTE — Patient Instructions (Addendum)
Influenza (Flu) Vaccine (Live, Intranasal): What You Need to Know 1. Why get vaccinated? Influenza ("flu") is a contagious disease that spreads around the [place] every year, usually between October and May. Flu is caused by influenza viruses, and is spread mainly by coughing, sneezing, and close contact. Anyone can get flu. Flu strikes suddenly and can last several days. Symptoms vary by age, but can include:  fever/chills  sore throat  muscle aches  fatigue  cough  headache  runny or stuffy nose  Flu can also lead to pneumonia and blood infections, and cause diarrhea and seizures in children. If you have a medical condition, such as heart or lung disease, flu can make it worse. Flu is more dangerous for some people. Infants and young children, people 59 years of age and older, pregnant women, and people with certain health conditions or a weakened immune system are at greatest risk. Each year thousands of people in the [place] die from flu, and many more are hospitalized. Flu vaccine can:  keep you from getting flu,  make flu less severe if you do get it, and  keep you from spreading flu to your family and other people.  2. Live, attenuated flu vaccine-LAIV, Nasal Spray A dose of flu vaccine is recommended every flu season. Children younger than 59 years of age may need two doses during the same flu season. Everyone else needs only one dose each flu season. The live, attenuated influenza vaccine (called LAIV) may be given to healthy, non-pregnant people 2 through 59 years of age. It may safely be given at the same time as other vaccines. LAIV is sprayed into the nose. LAIV does not contain thimerosal or other preservatives. It is made from weakened flu virus and does not cause flu. There are many flu viruses, and they are always changing. Each year LAIV is made to protect against four viruses that are likely to cause disease in the upcoming flu season. But even when  the vaccine doesn't exactly match these viruses, it may still provide some protection. Flu vaccine cannot prevent:  flu that is caused by a virus not covered by the vaccine, or  illnesses that look like flu but are not.  It takes about 2 weeks for protection to develop after vaccination, and protection lasts through the flu season. 3. Some people should not get this vaccine Some people should not get LAIV because of age, health conditions, or other reasons. Most of these people should get an injected flu vaccine instead. Your healthcare provider can help you decide. Tell the provider if you or the person being vaccinated:  have any allergies, including an allergy to eggs, or have ever had an allergic reaction to an influenza vaccine.  have ever had Guillain-Barr Syndrome (also called GBS).  have any long-term heart, breathing, kidney, liver, or nervous system problems.  have asthma or breathing problems, or are a child who has had wheezing episodes.  are pregnant.  are a child or adolescent who is receiving aspirin or aspirin-containing products.  have a weakened immune system.  will be visiting or taking care of someone, within the next 7 days, who requires a protected environment (for example, following a bone marrow transplant)  Sometimes LAIV should be delayed. Tell the provider if you or the person being vaccinated:  are not feeling well. The vaccine could be delayed until you feel better.  have gotten any other vaccines in the past 4 weeks. Live vaccines given too close  together might not work as well.  have taken influenza antiviral medication in the past 48 hours.  have a very stuffy nose.  4. Risks of a vaccine reaction With any medicine, including vaccines, there is a chance of reactions. These are usually mild and go away on their own, but serious reactions are also possible. Most people who get LAIV do not have any problems with it. Reactions to LAIV may resemble  a very mild case of flu. Problems that have been reported following LAIV:  Children and adolescents 59-61 years of age: ? runny nose/nasal congestion ? cough ? fever ? headache and muscle aches ? wheezing ? abdominal pain, vomiting, or diarrhea  Adults 59-41 years of age: ? runny nose/nasal congestion ? sore throat ? cough ? chills ? tiredness/weakness ? headache  Problems that could happen after any vaccine:  Any medication can cause a severe allergic reaction. Such reactions from a vaccine are very rare, estimated at about 1 in a million doses, and would happen within a few minutes to a few hours after the vaccination. As with any medicine, there is a very small chance of a vaccine causing a serious injury or death. The safety of vaccines is always being monitored. For more information, visit: http://floyd.org/ 5. What if there is a serious reaction? What should I look for? Look for anything that concerns you, such as signs of a severe allergic reaction, very high fever, or unusual behavior. Signs of a severe allergic reaction can include hives, swelling of the face and throat, difficulty breathing, a fast heartbeat, dizziness, and weakness. These would start a few minutes to a few hours after the vaccination. What should I do?  If you think it is a severe allergic reaction or other emergency that can't wait, call 9-1-1 and get the person to the nearest hospital. Otherwise, call your doctor.  Reactions should be reported to the Vaccine Adverse Event Reporting System (VAERS). Your doctor should file this report, or you can do it yourself through the VAERS web site at www.vaers.LAgents.no, or by calling 1-231-045-2092. ? VAERS does not give medical advice. 6. The National Vaccine Injury Compensation Program The [employer] Vaccine Injury Compensation Program (VICP) is a federal program that was created to compensate people who may have been injured by certain vaccines. Persons  who believe they may have been injured by a vaccine can learn about the program and about filing a claim by calling 1-775-595-6366 or visiting the VICP website at SpiritualWord.at. There is a time limit to file a claim for compensation. 7. How can I learn more?  Ask your healthcare provider. He or she can give you the vaccine package insert or suggest other sources of information.  Call your local or state health department.  Contact the Centers for Disease Control and Prevention (CDC): ? Call (217) 554-9757 (1-800-CDC-INFO) or ? Visit CDC's website at BiotechRoom.com.cy Vaccine Information Statement, Live Attenuated Influenza Vaccine (05/26/2014) This information is not intended to replace advice given to you by your health care provider. Make sure you discuss any questions you have with your health care provider. Document Released: 11/08/2010 Document Revised: 06/26/2016 Document Reviewed: 06/26/2016 Elsevier Interactive Patient Education  2017 [employer] Influenza (Flu) Vaccine (Live, Intranasal): What You Need to Know 1. Why get vaccinated? Influenza ("flu") is a contagious disease that spreads around the [place] every year, usually between October and May. Flu is caused by influenza viruses, and is spread mainly by coughing, sneezing, and close contact. Anyone can get  flu. Flu strikes suddenly and can last several days. Symptoms vary by age, but can include:  fever/chills  sore throat  muscle aches  fatigue  cough  headache  runny or stuffy nose  Flu can also lead to pneumonia and blood infections, and cause diarrhea and seizures in children. If you have a medical condition, such as heart or lung disease, flu can make it worse. Flu is more dangerous for some people. Infants and young children, people 68 years of age and older, pregnant women, and people with certain health conditions or a weakened immune system are at greatest risk. Each year thousands  of people in the [place] die from flu, and many more are hospitalized. Flu vaccine can:  keep you from getting flu,  make flu less severe if you do get it, and  keep you from spreading flu to your family and other people.  2. Live, attenuated flu vaccine-LAIV, Nasal Spray A dose of flu vaccine is recommended every flu season. Children younger than 10 years of age may need two doses during the same flu season. Everyone else needs only one dose each flu season. The live, attenuated influenza vaccine (called LAIV) may be given to healthy, non-pregnant people 2 through 59 years of age. It may safely be given at the same time as other vaccines. LAIV is sprayed into the nose. LAIV does not contain thimerosal or other preservatives. It is made from weakened flu virus and does not cause flu. There are many flu viruses, and they are always changing. Each year LAIV is made to protect against four viruses that are likely to cause disease in the upcoming flu season. But even when the vaccine doesn't exactly match these viruses, it may still provide some protection. Flu vaccine cannot prevent:  flu that is caused by a virus not covered by the vaccine, or  illnesses that look like flu but are not.  It takes about 2 weeks for protection to develop after vaccination, and protection lasts through the flu season. 3. Some people should not get this vaccine Some people should not get LAIV because of age, health conditions, or other reasons. Most of these people should get an injected flu vaccine instead. Your healthcare provider can help you decide. Tell the provider if you or the person being vaccinated:  have any allergies, including an allergy to eggs, or have ever had an allergic reaction to an influenza vaccine.  have ever had Guillain-Barr Syndrome (also called GBS).  have any long-term heart, breathing, kidney, liver, or nervous system problems.  have asthma or breathing problems, or are a  child who has had wheezing episodes.  are pregnant.  are a child or adolescent who is receiving aspirin or aspirin-containing products.  have a weakened immune system.  will be visiting or taking care of someone, within the next 7 days, who requires a protected environment (for example, following a bone marrow transplant)  Sometimes LAIV should be delayed. Tell the provider if you or the person being vaccinated:  are not feeling well. The vaccine could be delayed until you feel better.  have gotten any other vaccines in the past 4 weeks. Live vaccines given too close together might not work as well.  have taken influenza antiviral medication in the past 48 hours.  have a very stuffy nose.  4. Risks of a vaccine reaction With any medicine, including vaccines, there is a chance of reactions. These are usually mild and go away on their own,  but serious reactions are also possible. Most people who get LAIV do not have any problems with it. Reactions to LAIV may resemble a very mild case of flu. Problems that have been reported following LAIV:  Children and adolescents 4-75 years of age: ? runny nose/nasal congestion ? cough ? fever ? headache and muscle aches ? wheezing ? abdominal pain, vomiting, or diarrhea  Adults 58-93 years of age: ? runny nose/nasal congestion ? sore throat ? cough ? chills ? tiredness/weakness ? headache  Problems that could happen after any vaccine:  Any medication can cause a severe allergic reaction. Such reactions from a vaccine are very rare, estimated at about 1 in a million doses, and would happen within a few minutes to a few hours after the vaccination. As with any medicine, there is a very small chance of a vaccine causing a serious injury or death. The safety of vaccines is always being monitored. For more information, visit: http://floyd.org/ 5. What if there is a serious reaction? What should I look for? Look for anything that  concerns you, such as signs of a severe allergic reaction, very high fever, or unusual behavior. Signs of a severe allergic reaction can include hives, swelling of the face and throat, difficulty breathing, a fast heartbeat, dizziness, and weakness. These would start a few minutes to a few hours after the vaccination. What should I do?  If you think it is a severe allergic reaction or other emergency that can't wait, call 9-1-1 and get the person to the nearest hospital. Otherwise, call your doctor.  Reactions should be reported to the Vaccine Adverse Event Reporting System (VAERS). Your doctor should file this report, or you can do it yourself through the VAERS web site at www.vaers.LAgents.no, or by calling 1-7263939042. ? VAERS does not give medical advice. 6. The National Vaccine Injury Compensation Program The [employer] Vaccine Injury Compensation Program (VICP) is a federal program that was created to compensate people who may have been injured by certain vaccines. Persons who believe they may have been injured by a vaccine can learn about the program and about filing a claim by calling 1-(201)322-0408 or visiting the VICP website at SpiritualWord.at. There is a time limit to file a claim for compensation. 7. How can I learn more?  Ask your healthcare provider. He or she can give you the vaccine package insert or suggest other sources of information.  Call your local or state health department.  Contact the Centers for Disease Control and Prevention (CDC): ? Call 404-563-5475 (1-800-CDC-INFO) or ? Visit CDC's website at BiotechRoom.com.cy Vaccine Information Statement, Live Attenuated Influenza Vaccine (05/26/2014) This information is not intended to replace advice given to you by your health care provider. Make sure you discuss any questions you have with your health care provider. Document Released: 11/08/2010 Document Revised: 06/26/2016 Document Reviewed:  06/26/2016 Elsevier Interactive Patient Education  2017 [employer].

## 2018-08-27 ENCOUNTER — Telehealth: Payer: Self-pay | Admitting: Family Medicine

## 2018-08-27 DIAGNOSIS — E039 Hypothyroidism, unspecified: Secondary | ICD-10-CM

## 2018-08-27 LAB — UA/M W/RFLX CULTURE, ROUTINE
BILIRUBIN UA: NEGATIVE
GLUCOSE, UA: NEGATIVE
KETONES UA: NEGATIVE
Nitrite, UA: NEGATIVE
PH UA: 7 (ref 5.0–7.5)
PROTEIN UA: NEGATIVE
RBC, UA: NEGATIVE
Specific Gravity, UA: 1.015 (ref 1.005–1.030)
Urobilinogen, Ur: 0.2 mg/dL (ref 0.2–1.0)

## 2018-08-27 LAB — CBC WITH DIFFERENTIAL/PLATELET
Basophils Absolute: 0.1 10*3/uL (ref 0.0–0.2)
Basos: 1 %
EOS (ABSOLUTE): 0.2 10*3/uL (ref 0.0–0.4)
Eos: 3 %
Hematocrit: 38.7 % (ref 34.0–46.6)
Hemoglobin: 13.5 g/dL (ref 11.1–15.9)
IMMATURE GRANS (ABS): 0 10*3/uL (ref 0.0–0.1)
Immature Granulocytes: 0 %
LYMPHS: 41 %
Lymphocytes Absolute: 2.8 10*3/uL (ref 0.7–3.1)
MCH: 31.8 pg (ref 26.6–33.0)
MCHC: 34.9 g/dL (ref 31.5–35.7)
MCV: 91 fL (ref 79–97)
MONOCYTES: 8 %
Monocytes Absolute: 0.5 10*3/uL (ref 0.1–0.9)
Neutrophils Absolute: 3.2 10*3/uL (ref 1.4–7.0)
Neutrophils: 47 %
PLATELETS: 277 10*3/uL (ref 150–450)
RBC: 4.25 x10E6/uL (ref 3.77–5.28)
RDW: 12.5 % (ref 12.3–15.4)
WBC: 6.9 10*3/uL (ref 3.4–10.8)

## 2018-08-27 LAB — COMPREHENSIVE METABOLIC PANEL
ALBUMIN: 4.3 g/dL (ref 3.5–5.5)
ALT: 30 IU/L (ref 0–32)
AST: 21 IU/L (ref 0–40)
Albumin/Globulin Ratio: 1.5 (ref 1.2–2.2)
Alkaline Phosphatase: 76 IU/L (ref 39–117)
BUN/Creatinine Ratio: 23 (ref 9–23)
BUN: 19 mg/dL (ref 6–24)
Bilirubin Total: 0.3 mg/dL (ref 0.0–1.2)
CO2: 25 mmol/L (ref 20–29)
CREATININE: 0.84 mg/dL (ref 0.57–1.00)
Calcium: 9.6 mg/dL (ref 8.7–10.2)
Chloride: 101 mmol/L (ref 96–106)
GFR calc Af Amer: 88 mL/min/{1.73_m2} (ref >59)
GFR calc non Af Amer: 76 mL/min/{1.73_m2} (ref >59)
GLUCOSE: 102 mg/dL — AB (ref 65–99)
Globulin, Total: 2.8 g/dL (ref 1.5–4.5)
Potassium: 3.8 mmol/L (ref 3.5–5.2)
Sodium: 143 mmol/L (ref 134–144)
Total Protein: 7.1 g/dL (ref 6.0–8.5)

## 2018-08-27 LAB — TSH: TSH: 5.93 u[IU]/mL — ABNORMAL HIGH (ref 0.450–4.500)

## 2018-08-27 LAB — BAYER DCA HB A1C WAIVED: HB A1C (BAYER DCA - WAIVED): 5.6 % (ref <7.0)

## 2018-08-27 LAB — MICROSCOPIC EXAMINATION
Bacteria, UA: NONE SEEN
RBC, UA: NONE SEEN /hpf (ref 0–2)

## 2018-08-27 LAB — MICROALBUMIN, URINE WAIVED
Creatinine, Urine Waived: 200 mg/dL (ref 10–300)
Microalb, Ur Waived: 10 mg/L (ref 0–19)
Microalb/Creat Ratio: <30 mg/g (ref <30)

## 2018-08-27 LAB — LIPID PANEL W/O CHOL/HDL RATIO
Cholesterol, Total: 219 mg/dL — ABNORMAL HIGH (ref 100–199)
HDL: 37 mg/dL — AB (ref >39)
LDL CALC: 123 mg/dL — AB (ref 0–99)
TRIGLYCERIDES: 294 mg/dL — AB (ref 0–149)
VLDL CHOLESTEROL CAL: 59 mg/dL — AB (ref 5–40)

## 2018-08-27 MED ORDER — LEVOTHYROXINE SODIUM 125 MCG PO TABS: 125.0000 ug | ORAL_TABLET | Freq: Every day | ORAL | 1 refills | Status: DC

## 2018-08-27 NOTE — Telephone Encounter (Signed)
Patient notified.  Verbalized understanding. 

## 2018-08-27 NOTE — Telephone Encounter (Signed)
Please let her know that her labs look good, but her thyroid is a little low. I'd like to increase her medicine to and recheck in 6 weeks. Rx to her pharmacy and lab in. Thanks!

## 2018-09-29 ENCOUNTER — Other Ambulatory Visit: Payer: Self-pay | Admitting: Family Medicine

## 2018-09-29 DIAGNOSIS — I10 Essential (primary) hypertension: Secondary | ICD-10-CM

## 2018-09-30 NOTE — Telephone Encounter (Signed)
Requested Prescriptions  Pending Prescriptions Disp Refills  . hydrochlorothiazide (HYDRODIURIL) 25 MG tablet [Pharmacy Med Name: HYDROCHLOROTHIAZIDE TAB 25MG ] 90 tablet 0    Sig: TAKE 1 TABLET DAILY     Cardiovascular: Diuretics - Thiazide Passed - 09/29/2018 12:58 AM      Passed - Ca in normal range and within 360 days    Calcium  Date Value Ref Range Status  08/26/2018 9.6 8.7 - 10.2 mg/dL Final   Calcium, Total  Date Value Ref Range Status  10/24/2014 8.8 8.5 - 10.1 mg/dL Final         Passed - Cr in normal range and within 360 days    Creatinine  Date Value Ref Range Status  10/24/2014 0.76 0.60 - 1.30 mg/dL Final   Creatinine, Ser  Date Value Ref Range Status  08/26/2018 0.84 0.57 - 1.00 mg/dL Final         Passed - K in normal range and within 360 days    Potassium  Date Value Ref Range Status  08/26/2018 3.8 3.5 - 5.2 mmol/L Final    Comment:    Specimen received in contact with cells. No visible hemolysis present. However GLUC may be decreased and K increased. Clinical correlation indicated.   10/24/2014 3.9 3.5 - 5.1 mmol/L Final         Passed - Na in normal range and within 360 days    Sodium  Date Value Ref Range Status  08/26/2018 143 134 - 144 mmol/L Final  10/24/2014 139 136 - 145 mmol/L Final         Passed - Last BP in normal range    BP Readings from Last 1 Encounters:  08/26/18 120/78         Passed - Valid encounter within last 6 months    Recent Outpatient Visits          1 month ago Moderate episode of recurrent major depressive disorder (HCC)   [facility] Johnson, Megan P, DO   9 months ago Moderate episode of recurrent major depressive disorder (HCC)   [facility] [place]   10 months ago [facility]liculitis   [facility] [place] [place]   1 year ago Seborrheic dermatitis of scalp   [facility] [place]   1 year ago [facility]cess   [facility]  Gabriel CirriWicker, Cheryl, NP

## 2018-10-10 ENCOUNTER — Other Ambulatory Visit: Payer: Self-pay | Admitting: Family Medicine

## 2018-11-02 ENCOUNTER — Encounter (INDEPENDENT_AMBULATORY_CARE_PROVIDER_SITE_OTHER): Payer: BLUE CROSS/BLUE SHIELD

## 2018-11-08 ENCOUNTER — Other Ambulatory Visit: Payer: Self-pay | Admitting: Family Medicine

## 2018-11-17 ENCOUNTER — Encounter (INDEPENDENT_AMBULATORY_CARE_PROVIDER_SITE_OTHER): Payer: Self-pay | Admitting: Bariatrics

## 2018-11-17 ENCOUNTER — Ambulatory Visit (INDEPENDENT_AMBULATORY_CARE_PROVIDER_SITE_OTHER): Payer: BLUE CROSS/BLUE SHIELD | Admitting: Bariatrics

## 2018-11-17 VITALS — BP 139/82 | HR 78 | Temp 98.2°F | Ht 64.0 in | Wt 224.0 lb

## 2018-11-17 DIAGNOSIS — I10 Essential (primary) hypertension: Secondary | ICD-10-CM

## 2018-11-17 DIAGNOSIS — Z0289 Encounter for other administrative examinations: Secondary | ICD-10-CM

## 2018-11-17 DIAGNOSIS — R0602 Shortness of breath: Secondary | ICD-10-CM

## 2018-11-17 DIAGNOSIS — Z9189 Other specified personal risk factors, not elsewhere classified: Secondary | ICD-10-CM

## 2018-11-17 DIAGNOSIS — R5383 Other fatigue: Secondary | ICD-10-CM | POA: Diagnosis not present

## 2018-11-17 DIAGNOSIS — F3289 Other specified depressive episodes: Secondary | ICD-10-CM | POA: Diagnosis not present

## 2018-11-17 DIAGNOSIS — R7309 Other abnormal glucose: Secondary | ICD-10-CM

## 2018-11-17 DIAGNOSIS — E038 Other specified hypothyroidism: Secondary | ICD-10-CM

## 2018-11-17 DIAGNOSIS — Z6838 Body mass index (BMI) 38.0-38.9, adult: Secondary | ICD-10-CM

## 2018-11-17 DIAGNOSIS — Z87898 Personal history of other specified conditions: Secondary | ICD-10-CM

## 2018-11-17 DIAGNOSIS — E785 Hyperlipidemia, unspecified: Secondary | ICD-10-CM

## 2018-11-18 ENCOUNTER — Encounter (INDEPENDENT_AMBULATORY_CARE_PROVIDER_SITE_OTHER): Payer: Self-pay | Admitting: Bariatrics

## 2018-11-18 LAB — T3: T3, Total: 94 ng/dL (ref 71–180)

## 2018-11-18 LAB — TSH: TSH: 6.93 u[IU]/mL — ABNORMAL HIGH (ref 0.450–4.500)

## 2018-11-18 LAB — T4, FREE: Free T4: 1.15 ng/dL (ref 0.82–1.77)

## 2018-11-18 LAB — INSULIN, RANDOM: INSULIN: 33.8 u[IU]/mL — ABNORMAL HIGH (ref 2.6–24.9)

## 2018-11-18 LAB — HEMOGLOBIN A1C
ESTIMATED AVERAGE GLUCOSE: 117 mg/dL
Hgb A1c MFr Bld: 5.7 % — ABNORMAL HIGH (ref 4.8–5.6)

## 2018-11-18 LAB — VITAMIN D 25 HYDROXY (VIT D DEFICIENCY, FRACTURES): Vit D, 25-Hydroxy: 17.1 ng/mL — ABNORMAL LOW (ref 30.0–100.0)

## 2018-11-18 NOTE — Progress Notes (Signed)
Office: [phone]  /  Fax: [phone]   Dear Dr. Laural BenesJohnson,   Thank you for referring Emily Fischerina W Kunzman to our clinic. The following note includes my evaluation and treatment recommendations.  HPI:   Chief Complaint: OBESITY    Emily [name] has been referred by Dorcas CarrowMegan P. Johnson, DO for consultation regarding her obesity and obesity related comorbidities.    Emily [name] (MR# [id number]) is a 58 y.o. female who presents on 11/17/2018 for obesity evaluation and treatment. Current BMI is Body mass index is 38.45 kg/m.Marland Kitchen. Emily [name] has been struggling with her weight for many years and has been unsuccessful in either losing weight, maintaining weight loss, or reaching her healthy weight goal.     Emily [name] attended our information session and states she is currently in the action stage of change and ready to dedicate time achieving and maintaining a healthier weight. Emily [name] is interested in becoming our patient and working on intensive lifestyle modifications including (but not limited to) diet, exercise and weight loss.    Emily [name] states her family eats meals together she thinks her family will eat healthier with  her her desired weight loss is 69 lbs she started gaining weight in the last 5 yrs her heaviest weight ever was 220 lbs. she craves ice cream, burgers and sweets she snacks frequently in the evenings she states that she goes out frequently she skips meals frequently she is frequently drinking liquids with calories she sometimes makes poor food choices she struggles with emotional eating    Fatigue Emily [name] feels her energy is lower than it should be. She states that she is "always tired and has no energy". This has worsened with weight gain and has not worsened recently. Emily [name] admits to daytime somnolence and she admits to waking up still tired. Patient is at risk for obstructive sleep apnea. Patent has a history of symptoms of daytime fatigue, morning fatigue, morning headache and hypertension.  Patient generally gets 9 hours of sleep per night, and states they generally have restful sleep. Snoring is present. Apneic episodes are not present. Epworth Sleepiness Score is 8  Dyspnea on exertion [name] notes increasing shortness of breath with exercising and seems to be worsening over time with weight gain. She notes getting out of breath sooner with activity than she used to. This has not gotten worse recently. Emily [name] denies orthopnea.  History of Atypical Chest Pain Emily [name] has physical deconditioning. She went to see the cardiologist. Emily [name] is currently taking Metoprolol.  Hypertension Emily [name] is a 58 y.o. female with hypertension. She is currently taking HCTZ and Cozaar. Emily Fischerina W Birdwell denies chest pain. She is working weight loss to help control her blood pressure with the goal of decreasing her risk of heart attack and stroke. [name] blood pressure is reasonably well controlled.  Hypothyroidism Emily [name] has a diagnosis of hypothyroidism. Her last TSH level was at 5.900. She is currently taking Synthroid. Her medications were increased per her PCP. She denies hot or cold intolerance or palpitations, but does admit to low energy.  Dyslipidemia Emily [name] has dyslipidemia. Her total cholesterol and LDL are elevated and her HDL is low. She has elevated triglycerides. She is attempting to improve her cholesterol levels with intensive lifestyle modification including a low saturated fat diet, exercise and weight loss. She denies myalgias.  Elevated Glucose Emily [name] has a history of some elevated blood glucose readings without a diagnosis of diabetes. She denies polyphagia.  At risk for diabetes Emily [name] is at higher than  average risk for developing diabetes due to her obesity and elevated glucose. She currently denies polyuria or polydipsia.  Depression with emotional eating behaviors Emily [name] is doing "boredom and stress eating" mainly at night. She struggles with emotional eating and using food for comfort to  the extent that it is negatively impacting her health. She often snacks when she is not hungry. Emily [name] sometimes feels she is out of control and then feels guilty that she made poor food choices. She is attempting to work on behavior modification techniques to help reduce her emotional eating. She shows no sign of suicidal or homicidal ideations. PHQ-9 score is 14  Depression Screen Emily [name]'s Food and Mood (modified PHQ-9) score was  Depression screen PHQ 2/9 11/17/2018  Decreased Interest 2  Down, Depressed, Hopeless 3  PHQ - 2 Score 5  Altered sleeping 1  Tired, decreased energy 3  Change in appetite 2  Feeling bad or failure about yourself  1  Trouble concentrating 2  Moving slowly or fidgety/restless 0  Suicidal thoughts 0  PHQ-9 Score 14  Difficult doing work/chores Not difficult at all    ASSESSMENT AND PLAN:  Other fatigue - Plan: EKG 12-Lead, VITAMIN D 25 Hydroxy (Vit-D Deficiency, Fractures)  Shortness of breath on exertion  Essential hypertension  Other specified hypothyroidism - Plan: T3, T4, free, TSH  Dyslipidemia  History of chest pain  Elevated glucose - Plan: Hemoglobin A1c, Insulin, random  Other depression - with emotional eating  At risk for diabetes mellitus  Class 2 severe obesity with serious comorbidity and body mass index (BMI) of 38.0 to 38.9 in adult, unspecified obesity type (HCC)  PLAN:  Fatigue Lemma was informed that her fatigue may be related to obesity, depression or many other causes. Labs will be ordered, and in the meanwhile Suhana has agreed to work on diet, exercise and weight loss to help with fatigue. Proper sleep hygiene was discussed including the need for 7-8 hours of quality sleep each night. A sleep study was not ordered based on symptoms and Epworth score.  Dyspnea on exertion Vaishali's shortness of breath appears to be obesity related and exercise induced. She has agreed to work on weight loss and gradually increase exercise to treat  her exercise induced shortness of breath. If Vana follows our instructions and loses weight without improvement of her shortness of breath, we will plan to refer to pulmonology. We will monitor this condition regularly. Oralia agrees to this plan.  History of Atypical Chest Pain Jenay will follow up with her cardiologist in one year. She will follow up with our clinic in 2 weeks.  Hypertension We discussed sodium restriction, working on healthy weight loss, and a regular exercise program as the means to achieve improved blood pressure control. Avacyn agreed with this plan and agreed to follow up as directed. We will continue to monitor her blood pressure as well as her progress with the above lifestyle modifications. She will continue her medications as prescribed and will watch for signs of hypotension as she continues her lifestyle modifications.  Hypothyroidism Tennyson was informed of the importance of good thyroid control to help with weight loss efforts. She was also informed that supertheraputic thyroid levels are dangerous and will not improve weight loss results. Nathalie will continue her medications as prescribed.  Dyslipidemia Stefanie was informed of the American Heart Association Guidelines emphasizing intensive lifestyle modifications as the first line treatment for dyslipidemia. We discussed many lifestyle modifications today in depth, and Ailana will start to  work on decreasing saturated fats such as fatty red meat, butter and many fried foods. She will also increase vegetables and lean protein in her diet and start to work on exercise and weight loss efforts. No medications at this time.  Elevated Glucose Fasting labs will be obtained (Hgb A1c and insulin level) and results with be discussed with Emily [name] in 2 weeks at her follow up visit. In the meanwhile Emily [name] was started on a lower simple carbohydrate diet and will work on weight loss efforts.  Diabetes risk counseling Emily [name] was given extended (15  minutes) diabetes prevention counseling today. She is 58 y.o. female and has risk factors for diabetes including obesity and elevated glucose. We discussed intensive lifestyle modifications today with an emphasis on weight loss as well as increasing exercise and decreasing simple carbohydrates in her diet.  Depression with Emotional Eating Behaviors We discussed behavior modification techniques today to help Emily [name] deal with her emotional eating and depression. Emily [name] agreed to follow up as directed.  Depression Screen Emily [name] had a moderately positive depression screening. Depression is commonly associated with obesity and often results in emotional eating behaviors. We will monitor this closely and work on CBT to help improve the non-hunger eating patterns. Referral to Psychology may be required if no improvement is seen as she continues in our clinic.  Obesity Emily [name] is currently in the action stage of change and her goal is to continue with weight loss efforts. I recommend Emily [name] begin the structured treatment plan as follows:  She has agreed to follow the Category 2 plan with 200 to 350 calorie microwave meal Emily [name] has been instructed to eventually work up to a goal of 150 minutes of combined cardio and strengthening exercise per week for weight loss and overall health benefits. We discussed the following Behavioral Modification Strategies today: increase H2O intake, better snacking choices, keeping healthy foods in the home, increasing lean protein intake, decreasing simple carbohydrates, increasing vegetables, work on meal planning and easy cooking plans and emotional eating strategies   She was informed of the importance of frequent follow up visits to maximize her success with intensive lifestyle modifications for her multiple health conditions. She was informed we would discuss her lab results at her next visit unless there is a critical issue that needs to be addressed sooner. Emily [name] agreed to keep her  next visit at the agreed upon time to discuss these results.  ALLERGIES: No Known Allergies  MEDICATIONS: Current Outpatient Medications on File Prior to Visit  Medication Sig Dispense Refill  . cholecalciferol (VITAMIN D) 1000 UNITS tablet Take 1,000 Units by mouth daily as needed.     . cyclobenzaprine (FLEXERIL) 10 MG tablet Take 1 tablet (10 mg total) by mouth at bedtime. 30 tablet 3  . hydrochlorothiazide (HYDRODIURIL) 25 MG tablet TAKE 1 TABLET DAILY 90 tablet 0  . levothyroxine (SYNTHROID, LEVOTHROID) 125 MCG tablet TAKE 1 TABLET(125 MCG) BY MOUTH DAILY BEFORE BREAKFAST 30 tablet 1  . losartan (COZAAR) 100 MG tablet TAKE 1 TABLET DAILY (PLEASE CALL THE OFFICE FOR AN APPOINTMENT FOR MORE REFILLS) 90 tablet 4  . meloxicam (MOBIC) 7.5 MG tablet Take 7.5 mg by mouth daily. Reported on 12/04/2015  3  . metoprolol succinate (TOPROL-XL) 25 MG 24 hr tablet Take 1 tablet (25 mg total) by mouth daily. Take with or immediately following a meal. 90 tablet 1  . Naproxen Sodium (ALEVE) 220 MG CAPS Take 1 capsule by mouth as needed.    . Omega-3 Fatty Acids (  FISH OIL PO) Take 1 tablet by mouth as needed.     Marland Kitchen omeprazole (PRILOSEC) 20 MG capsule Take 20 mg by mouth daily as needed.    . venlafaxine XR (EFFEXOR-XR) 150 MG 24 hr capsule TAKE 1 CAPSULE DAILY WITH BREAKFAST 90 capsule 1  . venlafaxine XR (EFFEXOR-XR) 75 MG 24 hr capsule TAKE 1 CAPSULE DAILY WITH BREAKFAST 90 capsule 1   No current facility-administered medications on file prior to visit.     PAST MEDICAL HISTORY: Past Medical History:  Diagnosis Date  . Anxiety   . Back pain   . Chest pain    a. 10/2013 Neg ETT; b. 10/2015 Refused MV->Cath: nl cors, nl EF.  . Degenerative disc disease, lumbar   . Depression   . Essential hypertension   . Fatigue   . GERD (gastroesophageal reflux disease)   . Hypothyroidism   . Joint pain in fingers of both hands   . Morbid obesity (HCC)   . Myalgia     PAST SURGICAL HISTORY: Past Surgical  History:  Procedure Laterality Date  . CARDIAC CATHETERIZATION N/A 11/14/2015   Procedure: Left Heart Cath and Coronary Angiography;  Surgeon: Iran Ouch, MD;  Location: [facility];  Service: Cardiovascular;  Laterality: N/A;  . CARPAL TUNNEL RELEASE Left 07/03/2015   Procedure: CARPAL TUNNEL RELEASE;  Surgeon: Myra Rude, MD;  Location: [facility];  Service: Orthopedics;  Laterality: Left;  . CARPAL TUNNEL RELEASE Right 09/07/2015   Procedure: CARPAL TUNNEL RELEASE;  Surgeon: Myra Rude, MD;  Location: [facility];  Service: Orthopedics;  Laterality: Right;  . CHOLECYSTECTOMY    . KNEE SURGERY Right   . TUBAL LIGATION      SOCIAL HISTORY: Social History   Tobacco Use  . Smoking status: Former Smoker    Packs/day: 0.25    Years: 10.00    Pack years: 2.50    Types: Cigarettes    Last attempt to quit: 07/02/1995    Years since quitting: 23.3  . Smokeless tobacco: Never Used  Substance Use Topics  . Alcohol use: No    Alcohol/week: 0.0 standard drinks  . Drug use: No    FAMILY HISTORY: Family History  Problem Relation Age of Onset  . Stroke Mother   . Heart failure Mother   . Heart disease Mother   . Hypertension Mother   . Thyroid disease Mother   . Depression Mother   . Anxiety disorder Mother   . Obesity Mother   . Heart attack Father 38  . Hypertension Father   . Hyperlipidemia Father   . Alzheimer's disease Father   . Heart disease Father   . Obesity Father   . CAD Cousin 53       passed  . Cancer Paternal Uncle        prostate  . Diabetes Maternal Grandmother   . Diabetes Paternal Grandmother     ROS: Review of Systems  Constitutional: Positive for malaise/fatigue.  HENT: Positive for sinus pain.   Eyes:       + Wear Glasses or Contacts  Respiratory: Positive for shortness of breath (with activity).   Cardiovascular: Negative for chest pain, palpitations and orthopnea.       + Calf/Leg Pain with Walking  Gastrointestinal:  Positive for heartburn.  Genitourinary: Negative for frequency.  Musculoskeletal: Positive for back pain and neck pain. Negative for myalgias.       + Neck Stiffness + Muscle or Joint Pain + Muscle Stiffness  Skin: Positive for rash.       + Dryness  Neurological: Positive for weakness and headaches.  Endo/Heme/Allergies: Negative for polydipsia.       Negative for heat or cold intolerance  Psychiatric/Behavioral: Positive for depression. Negative for suicidal ideas. The patient is nervous/anxious (nervousness).        + Stress    PHYSICAL EXAM: Blood pressure 139/82, pulse 78, temperature 98.2 F (36.8 C), temperature source Oral, height 5\' 4"  (1.626 m), weight 224 lb (101.6 kg), SpO2 96 %. Body mass index is 38.45 kg/m. Physical Exam Vitals signs reviewed.  Constitutional:      Appearance: Normal appearance. She is well-developed. She is obese.  HENT:     Head: Normocephalic and atraumatic.     Nose: Nose normal.  Eyes:     General: No scleral icterus.    Extraocular Movements: Extraocular movements intact.  Neck:     Musculoskeletal: Normal range of motion and neck supple.     Comments: Large normal thyroid Cardiovascular:     Rate and Rhythm: Normal rate and regular rhythm.  Pulmonary:     Effort: Pulmonary effort is normal. No respiratory distress.  Abdominal:     Palpations: Abdomen is soft.     Tenderness: There is no abdominal tenderness.  Musculoskeletal: Normal range of motion.     Comments: Range of Motion normal in all 4 extremities  Skin:    General: Skin is warm and dry.  Neurological:     Mental Status: She is alert and oriented to person, place, and time.     Coordination: Coordination normal.  Psychiatric:        Mood and Affect: Mood normal.        Behavior: Behavior normal.        Thought Content: Thought content does not include homicidal or suicidal ideation.     RECENT LABS AND TESTS: BMET    Component Value Date/Time   NA 143 08/26/2018  1422   NA 139 10/24/2014 0708   K 3.8 08/26/2018 1422   K 3.9 10/24/2014 0708   CL 101 08/26/2018 1422   CL 106 10/24/2014 0708   CO2 25 08/26/2018 1422   CO2 27 10/24/2014 0708   GLUCOSE 102 (H) 08/26/2018 1422   GLUCOSE 97 11/12/2015 1827   GLUCOSE 108 (H) 10/24/2014 0708   BUN 19 08/26/2018 1422   BUN 21 (H) 10/24/2014 0708   CREATININE 0.84 08/26/2018 1422   CREATININE 0.76 10/24/2014 0708   CALCIUM 9.6 08/26/2018 1422   CALCIUM 8.8 10/24/2014 0708   GFRNONAA 76 08/26/2018 1422   GFRNONAA >60 10/24/2014 0708   GFRAA 88 08/26/2018 1422   GFRAA >60 10/24/2014 0708   Lab Results  Component Value Date   HGBA1C WILL FOLLOW 11/17/2018   Lab Results  Component Value Date   INSULIN WILL FOLLOW 11/17/2018   CBC    Component Value Date/Time   WBC 6.9 08/26/2018 1422   WBC 7.5 11/12/2015 1827   RBC 4.25 08/26/2018 1422   RBC 4.44 11/12/2015 1827   HGB 13.5 08/26/2018 1422   HCT 38.7 08/26/2018 1422   PLT 277 08/26/2018 1422   MCV 91 08/26/2018 1422   MCV 94 10/24/2014 0708   MCH 31.8 08/26/2018 1422   MCH 30.9 11/12/2015 1827   MCHC 34.9 08/26/2018 1422   MCHC 33.7 11/12/2015 1827   RDW 12.5 08/26/2018 1422   RDW 13.2 10/24/2014 0708   LYMPHSABS 2.8 08/26/2018 1422   EOSABS 0.2  08/26/2018 1422   BASOSABS 0.1 08/26/2018 1422   Iron/TIBC/Ferritin/ %Sat No results found for: IRON, TIBC, FERRITIN, IRONPCTSAT Lipid Panel     Component Value Date/Time   CHOL 219 (H) 08/26/2018 1422   TRIG 294 (H) 08/26/2018 1422   HDL 37 (L) 08/26/2018 1422   LDLCALC 123 (H) 08/26/2018 1422   Hepatic Function Panel     Component Value Date/Time   PROT 7.1 08/26/2018 1422   ALBUMIN 4.3 08/26/2018 1422   AST 21 08/26/2018 1422   ALT 30 08/26/2018 1422   ALKPHOS 76 08/26/2018 1422   BILITOT 0.3 08/26/2018 1422      Component Value Date/Time   TSH 6.930 (H) 11/17/2018 1049   TSH 5.930 (H) 08/26/2018 1422   TSH 2.580 12/22/2017 1315    ECG  shows NSR with a rate of 76  BPM INDIRECT CALORIMETER done today shows a VO2 of 261 and a REE of 1817.  Her calculated basal metabolic rate is 3300 thus her basal metabolic rate is better than expected.       OBESITY BEHAVIORAL INTERVENTION VISIT  Today's visit was # 1   Starting weight: 224 lbs Starting date: 11/17/2018 Today's weight : 224 lbs  Today's date: 11/17/2018 Total lbs lost to date: 0   ASK: We discussed the diagnosis of obesity with Emily Fischer today and Zabdi agreed to give [name] permission to discuss obesity behavioral modification therapy today.  ASSESS: Kymberlynn has the diagnosis of obesity and her BMI today is 38.43 Angelle is in the action stage of change   ADVISE: Tsuyako was educated on the multiple health risks of obesity as well as the benefit of weight loss to improve her health. She was advised of the need for long term treatment and the importance of lifestyle modifications to improve her current health and to decrease her risk of future health problems.  AGREE: Multiple dietary modification options and treatment options were discussed and  Navi agreed to follow the recommendations documented in the above note.  ARRANGE: Oumou was educated on the importance of frequent visits to treat obesity as outlined per CMS and USPSTF guidelines and agreed to schedule her next follow up appointment today.  Cristi Loron, am acting as Energy manager for [employer]. Manson Passey, DO  I have reviewed the above documentation for accuracy and completeness, and I agree with the above. -Corinna Capra, DO

## 2018-12-01 ENCOUNTER — Ambulatory Visit (INDEPENDENT_AMBULATORY_CARE_PROVIDER_SITE_OTHER): Payer: BLUE CROSS/BLUE SHIELD | Admitting: Bariatrics

## 2018-12-01 ENCOUNTER — Other Ambulatory Visit (INDEPENDENT_AMBULATORY_CARE_PROVIDER_SITE_OTHER): Payer: Self-pay | Admitting: Bariatrics

## 2018-12-01 ENCOUNTER — Encounter (INDEPENDENT_AMBULATORY_CARE_PROVIDER_SITE_OTHER): Payer: Self-pay | Admitting: Bariatrics

## 2018-12-01 VITALS — BP 144/83 | HR 89 | Temp 99.0°F | Ht 64.0 in | Wt 220.0 lb

## 2018-12-01 DIAGNOSIS — Z9189 Other specified personal risk factors, not elsewhere classified: Secondary | ICD-10-CM

## 2018-12-01 DIAGNOSIS — E559 Vitamin D deficiency, unspecified: Secondary | ICD-10-CM

## 2018-12-01 DIAGNOSIS — R7303 Prediabetes: Secondary | ICD-10-CM

## 2018-12-01 DIAGNOSIS — E038 Other specified hypothyroidism: Secondary | ICD-10-CM | POA: Diagnosis not present

## 2018-12-01 DIAGNOSIS — Z6837 Body mass index (BMI) 37.0-37.9, adult: Secondary | ICD-10-CM

## 2018-12-01 DIAGNOSIS — E66812 Obesity, class 2: Secondary | ICD-10-CM

## 2018-12-01 MED ORDER — VITAMIN D (ERGOCALCIFEROL) 1.25 MG (50000 UNIT) PO CAPS: 50000.0000 [IU] | ORAL_CAPSULE | ORAL | 0 refills | Status: DC

## 2018-12-01 MED ORDER — LEVOTHYROXINE SODIUM 137 MCG PO TABS: 137.0000 ug | ORAL_TABLET | Freq: Every day | ORAL | 0 refills | Status: DC

## 2018-12-02 DIAGNOSIS — R7303 Prediabetes: Secondary | ICD-10-CM | POA: Insufficient documentation

## 2018-12-02 DIAGNOSIS — E559 Vitamin D deficiency, unspecified: Secondary | ICD-10-CM | POA: Insufficient documentation

## 2018-12-02 NOTE — Progress Notes (Signed)
Office: [phone]  /  Fax: [phone]   HPI:   Chief Complaint: OBESITY Emily [name] to discuss her progress with her obesity treatment plan. She is on the Category 2 plan with 200 to 350 calorie microwave meals and is following her eating plan approximately 30 % of the time. She states she is exercising 0 minutes 0 times per week. Emily [name] started the plan on Monday of this week. She has the "flu" and could not eat the food while sick. She likes the plan and thinks that it is reasonable. She denies hunger. Emily [name] has increased her water intake.  Her weight is 220 lb (99.8 kg) today and has had a weight loss of 4 pounds over a period of 3 weeks since her last visit. She has lost 4  lbs since starting treatment with us.  Vitamin D deficiency Emily [name] has a diagnosis of vitamin D deficiency. She is not currently taking vit D and her level is 17.1 on 11/17/18.  At risk for osteopenia and osteoporosis Emily [name] is at higher risk of osteopenia and osteoporosis due to vitamin D deficiency.   Pre-Diabetes Emily [name] has a diagnosis of pre-diabetes based on her elevated Hgb A1c and was informed this puts her at greater risk of developing diabetes. Her A1c was 5.7 and her Insulin was 33.8 on 11/17/18. She is not taking metformin currently and continues to work on diet and exercise to decrease risk of diabetes. She denies polyphagia.  Hypothyroid Emily [name] has a diagnosis of hypothyroidism. She is on levothyroxine 125 mcg which was increased by her PCP. Jelene's TSH is elevated and her T3 and free T4 is normal. Her previous TSH was 5.9 and her last TSH was 6.9 on 11/17/18.  ASSESSMENT AND PLAN:  Vitamin D deficiency - Plan: Vitamin D, Ergocalciferol, (DRISDOL) 1.25 MG (50000 UT) CAPS capsule  Prediabetes  Other specified hypothyroidism - Plan: levothyroxine (SYNTHROID, LEVOTHROID) 137 MCG tablet  At risk for osteoporosis  Class 2 severe obesity with serious comorbidity and body mass index (BMI) of 37.0 to 37.9 in  adult, unspecified obesity type (HCC)  PLAN:  Vitamin D Deficiency Emily [name] was informed that low vitamin D levels contributes to fatigue and are associated with obesity, breast, and colon cancer. She agrees to continue to take prescription Vit D @50 ,000 IU every week #4 with no refills and will follow up for routine testing of vitamin D, at least 2-3 times per year. She was informed of the risk of over-replacement of vitamin D and agrees to not increase her dose unless she discusses this with us first. Emily [name] agrees to follow up in 2 weeks.  At risk for osteopenia and osteoporosis Emily [name] was given extended (15 minutes) osteoporosis prevention counseling today. Emily [name] is at risk for osteopenia and osteoporosis due to her vitamin D deficiency. She was encouraged to take her vitamin D and follow her higher calcium diet and increase strengthening exercise to help strengthen her bones and decrease her risk of osteopenia and osteoporosis.  Pre-Diabetes Emily [name] will continue to work on weight loss, exercise, and decreasing simple carbohydrates in her diet to help decrease the risk of diabetes. We dicussed pre-diabetes per myself and our registered dietitian. She was informed that eating too many simple carbohydrates or too many calories at one sitting increases the likelihood of GI side effects. Emily [name] agreed to follow up with us as directed to monitor her progress in 2 weeks.  Hypothyroid Emily [name] was informed of the importance of good thyroid control to help  with weight loss efforts. She was also informed that supertherapeutic thyroid levels are dangerous and will not improve weight loss results. We will recheck her TSH in 6 weeks and will increase her levothyroxine to 137 mcg, 1 tablet PO daily #30 with no refills. Emi agrees with this plan and to follow up with our clinic in 2 weeks.  Obesity Zuriel is currently in the action stage of change. As such, her goal is to continue with weight loss efforts and meal  planning. She has agreed to follow the Category 2 plan with 200 to 350 calorie microwave meals. Lynde has been instructed to work up to a goal of 150 minutes of combined cardio and strengthening exercise per week for weight loss and overall health benefits. We discussed the following Behavioral Modification Strategies today: increasing lean protein intake, decreasing simple carbohydrates, increasing vegetables, increase H2O intake, decrease eating out, no skipping meals, keeping healthy foods in the home, and work on meal planning and easy cooking plans.  Bethene has agreed to follow up with our clinic in 2 weeks. She was informed of the importance of frequent follow up visits to maximize her success with intensive lifestyle modifications for her multiple health conditions.  ALLERGIES: No Known Allergies  MEDICATIONS: Current Outpatient Medications on File Prior to Visit  Medication Sig Dispense Refill  . cholecalciferol (VITAMIN D) 1000 UNITS tablet Take 1,000 Units by mouth daily as needed.     . cyclobenzaprine (FLEXERIL) 10 MG tablet Take 1 tablet (10 mg total) by mouth at bedtime. 30 tablet 3  . hydrochlorothiazide (HYDRODIURIL) 25 MG tablet TAKE 1 TABLET DAILY 90 tablet 0  . losartan (COZAAR) 100 MG tablet TAKE 1 TABLET DAILY (PLEASE CALL THE OFFICE FOR AN APPOINTMENT FOR MORE REFILLS) 90 tablet 4  . meloxicam (MOBIC) 7.5 MG tablet Take 7.5 mg by mouth daily. Reported on 12/04/2015  3  . metoprolol succinate (TOPROL-XL) 25 MG 24 hr tablet Take 1 tablet (25 mg total) by mouth daily. Take with or immediately following a meal. 90 tablet 1  . Naproxen Sodium (ALEVE) 220 MG CAPS Take 1 capsule by mouth as needed.    . Omega-3 Fatty Acids (FISH OIL PO) Take 1 tablet by mouth as needed.     Marland Kitchen omeprazole (PRILOSEC) 20 MG capsule Take 20 mg by mouth daily as needed.    . venlafaxine XR (EFFEXOR-XR) 150 MG 24 hr capsule TAKE 1 CAPSULE DAILY WITH BREAKFAST 90 capsule 1  . venlafaxine XR (EFFEXOR-XR) 75  MG 24 hr capsule TAKE 1 CAPSULE DAILY WITH BREAKFAST 90 capsule 1   No current facility-administered medications on file prior to visit.     PAST MEDICAL HISTORY: Past Medical History:  Diagnosis Date  . Anxiety   . Back pain   . Chest pain    a. 10/2013 Neg ETT; b. 10/2015 Refused MV->Cath: nl cors, nl EF.  . Degenerative disc disease, lumbar   . Depression   . Essential hypertension   . Fatigue   . GERD (gastroesophageal reflux disease)   . Hypothyroidism   . Joint pain in fingers of both hands   . Morbid obesity (HCC)   . Myalgia     PAST SURGICAL HISTORY: Past Surgical History:  Procedure Laterality Date  . CARDIAC CATHETERIZATION N/A 11/14/2015   Procedure: Left Heart Cath and Coronary Angiography;  Surgeon: Iran Ouch, MD;  Location: [facility];  Service: Cardiovascular;  Laterality: N/A;  . CARPAL TUNNEL RELEASE Left 07/03/2015  Procedure: CARPAL TUNNEL RELEASE;  Surgeon: Myra Rude, MD;  Location: [facility];  Service: Orthopedics;  Laterality: Left;  . CARPAL TUNNEL RELEASE Right 09/07/2015   Procedure: CARPAL TUNNEL RELEASE;  Surgeon: Myra Rude, MD;  Location: [facility];  Service: Orthopedics;  Laterality: Right;  . CHOLECYSTECTOMY    . KNEE SURGERY Right   . TUBAL LIGATION      SOCIAL HISTORY: Social History   Tobacco Use  . Smoking status: Former Smoker    Packs/day: 0.25    Years: 10.00    Pack years: 2.50    Types: Cigarettes    Last attempt to quit: 07/02/1995    Years since quitting: 23.4  . Smokeless tobacco: Never Used  Substance Use Topics  . Alcohol use: No    Alcohol/week: 0.0 standard drinks  . Drug use: No    FAMILY HISTORY: Family History  Problem Relation Age of Onset  . Stroke Mother   . Heart failure Mother   . Heart disease Mother   . Hypertension Mother   . Thyroid disease Mother   . Depression Mother   . Anxiety disorder Mother   . Obesity Mother   . Heart attack Father 71  . Hypertension Father    . Hyperlipidemia Father   . Alzheimer's disease Father   . Heart disease Father   . Obesity Father   . CAD Cousin 53       passed  . Cancer Paternal Uncle        prostate  . Diabetes Maternal Grandmother   . Diabetes Paternal Grandmother     ROS: Review of Systems  Constitutional: Positive for weight loss.  Endo/Heme/Allergies:       Negative for polyphagia.    PHYSICAL EXAM: Blood pressure (!) 144/83, pulse 89, temperature 99 F (37.2 C), temperature source Oral, height 5\' 4"  (1.626 m), weight 220 lb (99.8 kg), SpO2 99 %. Body mass index is 37.76 kg/m. Physical Exam Vitals signs reviewed.  Constitutional:      Appearance: Normal appearance. She is obese.  Cardiovascular:     Rate and Rhythm: Normal rate.  Pulmonary:     Effort: Pulmonary effort is normal.  Musculoskeletal: Normal range of motion.  Skin:    General: Skin is warm and dry.  Neurological:     Mental Status: She is alert and oriented to person, place, and time.  Psychiatric:        Mood and Affect: Mood normal.        Behavior: Behavior normal.     RECENT LABS AND TESTS: BMET    Component Value Date/Time   NA 143 08/26/2018 1422   NA 139 10/24/2014 0708   K 3.8 08/26/2018 1422   K 3.9 10/24/2014 0708   CL 101 08/26/2018 1422   CL 106 10/24/2014 0708   CO2 25 08/26/2018 1422   CO2 27 10/24/2014 0708   GLUCOSE 102 (H) 08/26/2018 1422   GLUCOSE 97 11/12/2015 1827   GLUCOSE 108 (H) 10/24/2014 0708   BUN 19 08/26/2018 1422   BUN 21 (H) 10/24/2014 0708   CREATININE 0.84 08/26/2018 1422   CREATININE 0.76 10/24/2014 0708   CALCIUM 9.6 08/26/2018 1422   CALCIUM 8.8 10/24/2014 0708   GFRNONAA 76 08/26/2018 1422   GFRNONAA >60 10/24/2014 0708   GFRAA 88 08/26/2018 1422   GFRAA >60 10/24/2014 0708   Lab Results  Component Value Date   HGBA1C 5.7 (H) 11/17/2018   HGBA1C 5.6 08/26/2018   Lab Results  Component Value Date   INSULIN 33.8 (H) 11/17/2018   CBC    Component Value Date/Time    WBC 6.9 08/26/2018 1422   WBC 7.5 11/12/2015 1827   RBC 4.25 08/26/2018 1422   RBC 4.44 11/12/2015 1827   HGB 13.5 08/26/2018 1422   HCT 38.7 08/26/2018 1422   PLT 277 08/26/2018 1422   MCV 91 08/26/2018 1422   MCV 94 10/24/2014 0708   MCH 31.8 08/26/2018 1422   MCH 30.9 11/12/2015 1827   MCHC 34.9 08/26/2018 1422   MCHC 33.7 11/12/2015 1827   RDW 12.5 08/26/2018 1422   RDW 13.2 10/24/2014 0708   LYMPHSABS 2.8 08/26/2018 1422   EOSABS 0.2 08/26/2018 1422   BASOSABS 0.1 08/26/2018 1422   Iron/TIBC/Ferritin/ %Sat No results found for: IRON, TIBC, FERRITIN, IRONPCTSAT Lipid Panel     Component Value Date/Time   CHOL 219 (H) 08/26/2018 1422   TRIG 294 (H) 08/26/2018 1422   HDL 37 (L) 08/26/2018 1422   LDLCALC 123 (H) 08/26/2018 1422   Hepatic Function Panel     Component Value Date/Time   PROT 7.1 08/26/2018 1422   ALBUMIN 4.3 08/26/2018 1422   AST 21 08/26/2018 1422   ALT 30 08/26/2018 1422   ALKPHOS 76 08/26/2018 1422   BILITOT 0.3 08/26/2018 1422      Component Value Date/Time   TSH 6.930 (H) 11/17/2018 1049   TSH 5.930 (H) 08/26/2018 1422   TSH 2.580 12/22/2017 1315   Results for Chauncey FischerMANUEL, Kiyomi W (MRN [id number]) as of 12/02/2018 09:34  Ref. Range 11/17/2018 10:49  Vitamin D, 25-Hydroxy Latest Ref Range: 30.0 - 100.0 ng/mL 17.1 (L)    OBESITY BEHAVIORAL INTERVENTION VISIT  Today's visit was # 2   Starting weight: 224 lbs Starting date: 11/17/2018 Today's weight : Weight: 220 lb (99.8 kg)  Today's date: 12/01/2018 Total lbs lost to date: 4  ASK: We discussed the diagnosis of obesity with Chauncey Fischerina W Hurta today and Emily [name] agreed to give us permission to discuss obesity behavioral modification therapy today.  ASSESS: Emily [name] has the diagnosis of obesity and her BMI today is 37.7. Emily [name] is in the action stage of change.   ADVISE: Emily [name] was educated on the multiple health risks of obesity as well as the benefit of weight loss to improve her health. She was advised of  the need for long term treatment and the importance of lifestyle modifications to improve her current health and to decrease her risk of future health problems.  AGREE: Multiple dietary modification options and treatment options were discussed and Emily [name] agreed to follow the recommendations documented in the above note.  ARRANGE: Emily [name] was educated on the importance of frequent visits to treat obesity as outlined per CMS and USPSTF guidelines and agreed to schedule her next follow up appointment today.  I, Kirke Corinara Soares, CMA, am acting as Energy managertranscriptionist for [employer] Corporationngel A. Manson PasseyBrown, DO  I have reviewed the above documentation for accuracy and completeness, and I agree with the above. -Corinna CapraAngel Sharanya Templin, DO

## 2018-12-06 ENCOUNTER — Other Ambulatory Visit: Payer: Self-pay | Admitting: Family Medicine

## 2018-12-06 MED ORDER — VENLAFAXINE HCL ER 75 MG PO CP24: ORAL_CAPSULE | ORAL | 0 refills | Status: DC

## 2018-12-06 MED ORDER — VENLAFAXINE HCL ER 150 MG PO CP24: ORAL_CAPSULE | ORAL | 0 refills | Status: DC

## 2018-12-06 NOTE — Telephone Encounter (Signed)
Copied from CRM [id number]. Topic: General - Other >> Dec 06, 2018 12:26 PM Percival Spanish wrote: Pt call requesting refills, I tried to explain to her that we are showing she has refills. She said [facility] is telling her she does not. I tried asking her if she had her pill bottle so that she can see when she received her last RX . She did not have it, she  was getting frustrated with me asking her questions,  her husband jump saying I got her walking in circles, as I tried to explain to him I will let Dr Laural Benes know but she will see what I send and that she would not need a refill. I did tell him that looks like she never received the RX that Dr Laural Benes sent out in Nov and Dec 2019  that u all never received     losartan (COZAAR) 100 MG tablet    venlafaxine XR (EFFEXOR-XR) 150 MG 24 hr capsule   venlafaxine XR (EFFEXOR-XR) 75 MG 24 hr capsule

## 2018-12-06 NOTE — Telephone Encounter (Signed)
Called and spoke w/ [facility]. They did have Losartan, they stated it was sent out in December so it will be sent out again in March. However, the venlafaxine XR was sent to local pharmacy. It was cancelled at local pharmacy and called in to [facility]. They stated that it would take 8- 10 business days to ship. Called and relayed my finding to patient. She will need a short supply of venlafaxine (both doses) sent in to [facility], until she receives medication from mail order.

## 2018-12-06 NOTE — Telephone Encounter (Signed)
6 month/years supply of those medicines sent in in Nov/Dec to [facility]. Please check with [facility] to make sure they got the Rx, if they didn't, please call in orders already in. Thanks!

## 2018-12-16 ENCOUNTER — Encounter (INDEPENDENT_AMBULATORY_CARE_PROVIDER_SITE_OTHER): Payer: Self-pay | Admitting: Family Medicine

## 2018-12-16 ENCOUNTER — Ambulatory Visit (INDEPENDENT_AMBULATORY_CARE_PROVIDER_SITE_OTHER): Payer: BLUE CROSS/BLUE SHIELD | Admitting: Family Medicine

## 2018-12-16 VITALS — BP 144/81 | HR 83 | Temp 97.9°F | Ht 64.0 in | Wt 216.0 lb

## 2018-12-16 DIAGNOSIS — E559 Vitamin D deficiency, unspecified: Secondary | ICD-10-CM | POA: Diagnosis not present

## 2018-12-16 DIAGNOSIS — R7303 Prediabetes: Secondary | ICD-10-CM

## 2018-12-16 DIAGNOSIS — Z9189 Other specified personal risk factors, not elsewhere classified: Secondary | ICD-10-CM

## 2018-12-16 DIAGNOSIS — Z6837 Body mass index (BMI) 37.0-37.9, adult: Secondary | ICD-10-CM

## 2018-12-16 MED ORDER — VITAMIN D (ERGOCALCIFEROL) 1.25 MG (50000 UNIT) PO CAPS: 50000.0000 [IU] | ORAL_CAPSULE | ORAL | 0 refills | Status: DC

## 2018-12-20 NOTE — Progress Notes (Signed)
Office: [phone]  /  Fax: [phone]   HPI:   Chief Complaint: OBESITY Emily [name] is here to discuss her progress with her obesity treatment plan. She is on the Category 2 plan with 200-350 calorie microwave meals and is following her eating plan approximately 80 % of the time. She states she is exercising 0 minutes 0 times per week. Emily [name] is eating all the food on the plan except snack calories. She eats out 3 to 4 evenings per week. She eats protein and vegetables.  Her weight is 216 lb (98 kg) today and has had a weight loss of 4 pounds over a period of 2 weeks since her last visit. She has lost 8 lbs since starting treatment with us.  Vitamin D Deficiency Emily [name] has a diagnosis of vitamin D deficiency. She is currently taking prescription Vit D, but level is not at goal. Last Vit D was 17.1 on 11/17/2018. She notes fatigue and denies nausea, vomiting or muscle weakness.  At risk for osteopenia and osteoporosis Emily [name] is at higher risk of osteopenia and osteoporosis due to vitamin D deficiency.   Pre-Diabetes Emily [name] has a diagnosis of pre-diabetes based on her elevated Hgb A1c at 5.7 and was informed this puts her at greater risk of developing diabetes. She is not taking metformin currently and continues to work on diet and exercise to decrease risk of diabetes. She denies nausea or hypoglycemia.  ASSESSMENT AND PLAN:  Vitamin D deficiency - Plan: Vitamin D, Ergocalciferol, (DRISDOL) 1.25 MG (50000 UT) CAPS capsule  Prediabetes  At risk for osteoporosis  Class 2 severe obesity with serious comorbidity and body mass index (BMI) of 37.0 to 37.9 in adult, unspecified obesity type (HCC)  PLAN:  Vitamin D Deficiency Emily [name] was informed that low vitamin D levels contributes to fatigue and are associated with obesity, breast, and colon cancer. Emily [name] agrees to continue taking prescription Vit D @50 ,000 IU every week #4 and we will refill for 1 month. She will follow up for routine testing of  vitamin D, at least 2-3 times per year. She was informed of the risk of over-replacement of vitamin D and agrees to not increase her dose unless she discusses this with us first. Emily [name] agrees to follow up with our clinic in 2 weeks with myself and our registered dietitian.  At risk for osteopenia and osteoporosis Emily [name] was given extended (15 minutes) osteoporosis prevention counseling today. Emily [name] is at risk for osteopenia and osteoporsis due to her vitamin D deficiency. She was encouraged to take her vitamin D and follow her higher calcium diet and increase strengthening exercise to help strengthen her bones and decrease her risk of osteopenia and osteoporosis.  Pre-Diabetes Emily [name] will continue her meal plan, and will continue to work on weight loss, exercise, and decreasing simple carbohydrates in her diet to help decrease the risk of diabetes.  Emily [name] agrees to follow up with our clinic in 2 weeks with myself and our registered dietitian as directed to monitor her progress.  Obesity Emily [name] is currently in the action stage of change. As such, her goal is to continue with weight loss efforts She has agreed to follow the Category 2 plan and may eat 200-350 calorie microwave meals. Emily [name] has been instructed to work up to a goal of 150 minutes of combined cardio and strengthening exercise per week for weight loss and overall health benefits. We discussed the following Behavioral Modification Strategies today: planning for success Added lunch options. Emily [name] is to use all  of her snack calories every day.  Apiffany has agreed to follow up with our clinic in 2 weeks with myself and our registered dietitian. She was informed of the importance of frequent follow up visits to maximize her success with intensive lifestyle modifications for her multiple health conditions.  ALLERGIES: No Known Allergies  MEDICATIONS: Current Outpatient Medications on File Prior to Visit  Medication Sig Dispense Refill  .  cholecalciferol (VITAMIN D) 1000 UNITS tablet Take 1,000 Units by mouth daily as needed.     . cyclobenzaprine (FLEXERIL) 10 MG tablet Take 1 tablet (10 mg total) by mouth at bedtime. 30 tablet 3  . hydrochlorothiazide (HYDRODIURIL) 25 MG tablet TAKE 1 TABLET DAILY 90 tablet 0  . levothyroxine (SYNTHROID, LEVOTHROID) 137 MCG tablet Take 1 tablet (137 mcg total) by mouth daily before breakfast. 30 tablet 0  . losartan (COZAAR) 100 MG tablet TAKE 1 TABLET DAILY (PLEASE CALL THE OFFICE FOR AN APPOINTMENT FOR MORE REFILLS) 90 tablet 4  . meloxicam (MOBIC) 7.5 MG tablet Take 7.5 mg by mouth daily. Reported on 12/04/2015  3  . metoprolol succinate (TOPROL-XL) 25 MG 24 hr tablet Take 1 tablet (25 mg total) by mouth daily. Take with or immediately following a meal. 90 tablet 1  . Naproxen Sodium (ALEVE) 220 MG CAPS Take 1 capsule by mouth as needed.    . Omega-3 Fatty Acids (FISH OIL PO) Take 1 tablet by mouth as needed.     Marland Kitchen omeprazole (PRILOSEC) 20 MG capsule Take 20 mg by mouth daily as needed.    . venlafaxine XR (EFFEXOR-XR) 150 MG 24 hr capsule TAKE 1 CAPSULE DAILY WITH BREAKFAST 14 capsule 0  . venlafaxine XR (EFFEXOR-XR) 75 MG 24 hr capsule TAKE 1 CAPSULE DAILY WITH BREAKFAST 14 capsule 0   No current facility-administered medications on file prior to visit.     PAST MEDICAL HISTORY: Past Medical History:  Diagnosis Date  . Anxiety   . Back pain   . Chest pain    a. 10/2013 Neg ETT; b. 10/2015 Refused MV->Cath: nl cors, nl EF.  . Degenerative disc disease, lumbar   . Depression   . Essential hypertension   . Fatigue   . GERD (gastroesophageal reflux disease)   . Hypothyroidism   . Joint pain in fingers of both hands   . Morbid obesity (HCC)   . Myalgia     PAST SURGICAL HISTORY: Past Surgical History:  Procedure Laterality Date  . CARDIAC CATHETERIZATION N/A 11/14/2015   Procedure: Left Heart Cath and Coronary Angiography;  Surgeon: Iran Ouch, MD;  Location: [facility];  Service: Cardiovascular;  Laterality: N/A;  . CARPAL TUNNEL RELEASE Left 07/03/2015   Procedure: CARPAL TUNNEL RELEASE;  Surgeon: Myra Rude, MD;  Location: [facility];  Service: Orthopedics;  Laterality: Left;  . CARPAL TUNNEL RELEASE Right 09/07/2015   Procedure: CARPAL TUNNEL RELEASE;  Surgeon: Myra Rude, MD;  Location: [facility] ORS;  Service: Orthopedics;  Laterality: Right;  . CHOLECYSTECTOMY    . KNEE SURGERY Right   . TUBAL LIGATION      SOCIAL HISTORY: Social History   Tobacco Use  . Smoking status: Former Smoker    Packs/day: 0.25    Years: 10.00    Pack years: 2.50    Types: Cigarettes    Last attempt to quit: 07/02/1995    Years since quitting: 23.4  . Smokeless tobacco: Never Used  Substance Use Topics  . Alcohol use: No  Alcohol/week: 0.0 standard drinks  . Drug use: No    FAMILY HISTORY: Family History  Problem Relation Age of Onset  . Stroke Mother   . Heart failure Mother   . Heart disease Mother   . Hypertension Mother   . Thyroid disease Mother   . Depression Mother   . Anxiety disorder Mother   . Obesity Mother   . Heart attack Father 104  . Hypertension Father   . Hyperlipidemia Father   . Alzheimer's disease Father   . Heart disease Father   . Obesity Father   . CAD Cousin 53       passed  . Cancer Paternal Uncle        prostate  . Diabetes Maternal Grandmother   . Diabetes Paternal Grandmother     ROS: Review of Systems  Constitutional: Positive for malaise/fatigue and weight loss.  Gastrointestinal: Negative for nausea and vomiting.  Musculoskeletal:       Negative muscle weakness  Endo/Heme/Allergies:       Negative hypoglycemia    PHYSICAL EXAM: Blood pressure (!) 144/81, pulse 83, temperature 97.9 F (36.6 C), height  (1.626 m), weight 216 lb (98 kg), SpO2 95 %. Body mass index is 37.08 kg/m. Physical Exam Vitals signs reviewed.  Constitutional:      Appearance: Normal appearance. She is obese.    Cardiovascular:     Rate and Rhythm: Normal rate.     Pulses: Normal pulses.  Pulmonary:     Effort: Pulmonary effort is normal.     Breath sounds: Normal breath sounds.  Musculoskeletal: Normal range of motion.  Skin:    General: Skin is warm and dry.  Neurological:     Mental Status: She is alert and oriented to person, place, and time.  Psychiatric:        Mood and Affect: Mood normal.        Behavior: Behavior normal.     RECENT LABS AND TESTS: BMET    Component Value Date/Time   NA 143 08/26/2018 1422   NA 139 10/24/2014 0708   K 3.8 08/26/2018 1422   K 3.9 10/24/2014 0708   CL 101 08/26/2018 1422   CL 106 10/24/2014 0708   CO2 25 08/26/2018 1422   CO2 27 10/24/2014 0708   GLUCOSE 102 (H) 08/26/2018 1422   GLUCOSE 97 11/12/2015 1827   GLUCOSE 108 (H) 10/24/2014 0708   BUN 19 08/26/2018 1422   BUN 21 (H) 10/24/2014 0708   CREATININE 0.84 08/26/2018 1422   CREATININE 0.76 10/24/2014 0708   CALCIUM 9.6 08/26/2018 1422   CALCIUM 8.8 10/24/2014 0708   GFRNONAA 76 08/26/2018 1422   GFRNONAA >60 10/24/2014 0708   GFRAA 88 08/26/2018 1422   GFRAA >60 10/24/2014 0708   Lab Results  Component Value Date   HGBA1C 5.7 (H) 11/17/2018   HGBA1C 5.6 08/26/2018   Lab Results  Component Value Date   INSULIN 33.8 (H) 11/17/2018   CBC    Component Value Date/Time   WBC 6.9 08/26/2018 1422   WBC 7.5 11/12/2015 1827   RBC 4.25 08/26/2018 1422   RBC 4.44 11/12/2015 1827   HGB 13.5 08/26/2018 1422   HCT 38.7 08/26/2018 1422   PLT 277 08/26/2018 1422   MCV 91 08/26/2018 1422   MCV 94 10/24/2014 0708   MCH 31.8 08/26/2018 1422   MCH 30.9 11/12/2015 1827   MCHC 34.9 08/26/2018 1422   MCHC 33.7 11/12/2015 1827   RDW 12.5 08/26/2018  1422   RDW 13.2 10/24/2014 0708   LYMPHSABS 2.8 08/26/2018 1422   EOSABS 0.2 08/26/2018 1422   BASOSABS 0.1 08/26/2018 1422   Iron/TIBC/Ferritin/ %Sat No results found for: IRON, TIBC, FERRITIN, IRONPCTSAT Lipid Panel     Component  Value Date/Time   CHOL 219 (H) 08/26/2018 1422   TRIG 294 (H) 08/26/2018 1422   HDL 37 (L) 08/26/2018 1422   LDLCALC 123 (H) 08/26/2018 1422   Hepatic Function Panel     Component Value Date/Time   PROT 7.1 08/26/2018 1422   ALBUMIN 4.3 08/26/2018 1422   AST 21 08/26/2018 1422   ALT 30 08/26/2018 1422   ALKPHOS 76 08/26/2018 1422   BILITOT 0.3 08/26/2018 1422      Component Value Date/Time   TSH 6.930 (H) 11/17/2018 1049   TSH 5.930 (H) 08/26/2018 1422   TSH 2.580 12/22/2017 1315      OBESITY BEHAVIORAL INTERVENTION VISIT  Today's visit was # 3   Starting weight: 224 lbs Starting date: 11/17/2018 Today's weight : 216 lbs  Today's date: 12/16/2018 Total lbs lost to date: 8    12/16/2018  Height 5\' 4"  (1.626 m)  Weight 216 lb (98 kg)  BMI (Calculated) 37.06  BLOOD PRESSURE - SYSTOLIC 144  BLOOD PRESSURE - DIASTOLIC 81   Body Fat % 46.4 %  Total Body Water (lbs) 71.6 lbs     ASK: We discussed the diagnosis of obesity with [name] today and Iverna agreed to give [name] permission to discuss obesity behavioral modification therapy today.  ASSESS: Wendy has the diagnosis of obesity and her BMI today is 37.06 Kasy is in the action stage of change   ADVISE: Claire was educated on the multiple health risks of obesity as well as the benefit of weight loss to improve her health. She was advised of the need for long term treatment and the importance of lifestyle modifications to improve her current health and to decrease her risk of future health problems.  AGREE: Multiple dietary modification options and treatment options were discussed and  Sunshine agreed to follow the recommendations documented in the above note.  ARRANGE: Carmella was educated on the importance of frequent visits to treat obesity as outlined per CMS and USPSTF guidelines and agreed to schedule her next follow up appointment today.  Trude Mcburney, am acting as Energy manager for [place], FNP-C.  I  have reviewed the above documentation for accuracy and completeness, and I agree with the above.  - Camar Guyton, FNP-C.

## 2018-12-21 ENCOUNTER — Encounter (INDEPENDENT_AMBULATORY_CARE_PROVIDER_SITE_OTHER): Payer: Self-pay | Admitting: Family Medicine

## 2018-12-29 ENCOUNTER — Other Ambulatory Visit: Payer: Self-pay | Admitting: Family Medicine

## 2018-12-29 DIAGNOSIS — I10 Essential (primary) hypertension: Secondary | ICD-10-CM

## 2018-12-29 NOTE — Telephone Encounter (Signed)
Requested Prescriptions  Pending Prescriptions Disp Refills  . hydrochlorothiazide (HYDRODIURIL) 25 MG tablet [Pharmacy Med Name: HYDROCHLOROTHIAZIDE TAB 25MG ] 90 tablet 1    Sig: TAKE 1 TABLET DAILY     Cardiovascular: Diuretics - Thiazide Failed - 12/29/2018 12:56 AM      Failed - Last BP in normal range    BP Readings from Last 1 Encounters:  12/16/18 (!) 144/81         Passed - Ca in normal range and within 360 days    Calcium  Date Value Ref Range Status  08/26/2018 9.6 8.7 - 10.2 mg/dL Final   Calcium, Total  Date Value Ref Range Status  10/24/2014 8.8 8.5 - 10.1 mg/dL Final         Passed - Cr in normal range and within 360 days    Creatinine  Date Value Ref Range Status  10/24/2014 0.76 0.60 - 1.30 mg/dL Final   Creatinine, Ser  Date Value Ref Range Status  08/26/2018 0.84 0.57 - 1.00 mg/dL Final         Passed - K in normal range and within 360 days    Potassium  Date Value Ref Range Status  08/26/2018 3.8 3.5 - 5.2 mmol/L Final    Comment:    Specimen received in contact with cells. No visible hemolysis present. However GLUC may be decreased and K increased. Clinical correlation indicated.   10/24/2014 3.9 3.5 - 5.1 mmol/L Final         Passed - Na in normal range and within 360 days    Sodium  Date Value Ref Range Status  08/26/2018 143 134 - 144 mmol/L Final  10/24/2014 139 136 - 145 mmol/L Final         Passed - Valid encounter within last 6 months    Recent Outpatient Visits          4 months ago Moderate episode of recurrent major depressive disorder (HCC)   [facility] [place], Megan P, DO   1 year ago Moderate episode of recurrent major depressive disorder (HCC)   [facility] [place], Megan P, DO   1 year ago Folliculitis   [facility] [place], Megan P, DO   1 year ago Seborrheic dermatitis of scalp   [facility] [place], [place], DO   1 year ago Abscess   [facility]  Gabriel Cirri, NP

## 2018-12-31 ENCOUNTER — Other Ambulatory Visit (INDEPENDENT_AMBULATORY_CARE_PROVIDER_SITE_OTHER): Payer: Self-pay | Admitting: Bariatrics

## 2018-12-31 DIAGNOSIS — E038 Other specified hypothyroidism: Secondary | ICD-10-CM

## 2019-01-02 ENCOUNTER — Encounter (INDEPENDENT_AMBULATORY_CARE_PROVIDER_SITE_OTHER): Payer: Self-pay

## 2019-01-02 ENCOUNTER — Other Ambulatory Visit (INDEPENDENT_AMBULATORY_CARE_PROVIDER_SITE_OTHER): Payer: Self-pay | Admitting: Bariatrics

## 2019-01-02 DIAGNOSIS — E038 Other specified hypothyroidism: Secondary | ICD-10-CM

## 2019-01-03 ENCOUNTER — Ambulatory Visit (INDEPENDENT_AMBULATORY_CARE_PROVIDER_SITE_OTHER): Payer: BLUE CROSS/BLUE SHIELD | Admitting: Dietician

## 2019-01-03 ENCOUNTER — Encounter (INDEPENDENT_AMBULATORY_CARE_PROVIDER_SITE_OTHER): Payer: Self-pay

## 2019-01-03 MED ORDER — LEVOTHYROXINE SODIUM 137 MCG PO TABS: 137.0000 ug | ORAL_TABLET | Freq: Every day | ORAL | 0 refills | Status: DC

## 2019-01-12 ENCOUNTER — Encounter (INDEPENDENT_AMBULATORY_CARE_PROVIDER_SITE_OTHER): Payer: Self-pay

## 2019-01-13 ENCOUNTER — Encounter (INDEPENDENT_AMBULATORY_CARE_PROVIDER_SITE_OTHER): Payer: Self-pay

## 2019-01-13 ENCOUNTER — Ambulatory Visit (INDEPENDENT_AMBULATORY_CARE_PROVIDER_SITE_OTHER): Payer: BLUE CROSS/BLUE SHIELD | Admitting: Family Medicine

## 2019-01-20 ENCOUNTER — Other Ambulatory Visit (INDEPENDENT_AMBULATORY_CARE_PROVIDER_SITE_OTHER): Payer: Self-pay | Admitting: Bariatrics

## 2019-01-20 ENCOUNTER — Other Ambulatory Visit (INDEPENDENT_AMBULATORY_CARE_PROVIDER_SITE_OTHER): Payer: Self-pay | Admitting: Family Medicine

## 2019-01-20 DIAGNOSIS — E559 Vitamin D deficiency, unspecified: Secondary | ICD-10-CM

## 2019-01-20 DIAGNOSIS — E038 Other specified hypothyroidism: Secondary | ICD-10-CM

## 2019-02-10 ENCOUNTER — Ambulatory Visit (INDEPENDENT_AMBULATORY_CARE_PROVIDER_SITE_OTHER): Payer: BLUE CROSS/BLUE SHIELD | Admitting: Bariatrics

## 2019-02-10 ENCOUNTER — Encounter (INDEPENDENT_AMBULATORY_CARE_PROVIDER_SITE_OTHER): Payer: Self-pay | Admitting: Bariatrics

## 2019-02-10 ENCOUNTER — Other Ambulatory Visit: Payer: Self-pay

## 2019-02-10 DIAGNOSIS — E038 Other specified hypothyroidism: Secondary | ICD-10-CM

## 2019-02-10 DIAGNOSIS — E559 Vitamin D deficiency, unspecified: Secondary | ICD-10-CM

## 2019-02-10 DIAGNOSIS — Z6837 Body mass index (BMI) 37.0-37.9, adult: Secondary | ICD-10-CM

## 2019-02-10 MED ORDER — VITAMIN D (ERGOCALCIFEROL) 1.25 MG (50000 UNIT) PO CAPS: 50000.0000 [IU] | ORAL_CAPSULE | ORAL | 0 refills | Status: DC

## 2019-02-10 MED ORDER — LEVOTHYROXINE SODIUM 137 MCG PO TABS: 137.0000 ug | ORAL_TABLET | Freq: Every day | ORAL | 0 refills | Status: DC

## 2019-02-14 NOTE — Progress Notes (Signed)
Office: [phone]  /  Fax: [phone] TeleHealth Visit:  Emily [name] has verbally consented to this TeleHealth visit today. The patient is located at home, the provider is located at the [facility] office. The participants in this visit include the listed provider and patient, husband Trey PaulaJeff and any and all parties involved. The visit was conducted today via WebEx.  HPI:   Chief Complaint: OBESITY Emily [name] is here to discuss her progress with her obesity treatment plan. She is on the Category 2 plan and is following her eating plan approximately 80 % of the time. She states she is doing yard work for 2 to 3 hours 4 times per week. Emily [name] has lost 2 pounds since the last visit (12/16/18). She is losing the fat and muscle. Emily [name] is getting her protein. We were unable to weigh the patient today for this TeleHealth visit. She feels as if she has lost weight since her last visit. She has lost 3 lbs since starting treatment with us.  Hypothyroidism Emily [name] has a diagnosis of hypothyroidism. Her last TSH level was at 6.930 She is on levothyroxine. She has had no change in temperature sensitivity.  Vitamin D deficiency Emily [name] has a diagnosis of vitamin D deficiency. She is taking high dose vit D and denies nausea, vomiting or muscle weakness.  ASSESSMENT AND PLAN:  Other specified hypothyroidism - Plan: levothyroxine (SYNTHROID) 137 MCG tablet, DISCONTINUED: levothyroxine (SYNTHROID) 137 MCG tablet  Vitamin D deficiency - Plan: Vitamin D, Ergocalciferol, (DRISDOL) 1.25 MG (50000 UT) CAPS capsule, DISCONTINUED: Vitamin D, Ergocalciferol, (DRISDOL) 1.25 MG (50000 UT) CAPS capsule  Class 2 severe obesity with serious comorbidity and body mass index (BMI) of 37.0 to 37.9 in adult, unspecified obesity type (HCC)  PLAN:  Hypothyroidism Emily [name] was informed of the importance of good thyroid control to help with weight loss efforts. She was also informed that supertheraputic thyroid levels are  dangerous and will not improve weight loss results. Emily [name] agrees to continue levothyroxine 137 mcg once daily #30 with no refills and follow up as directed. We will recheck thyroid panel in the near future.  Vitamin D Deficiency Emily [name] was informed that low vitamin D levels contributes to fatigue and are associated with obesity, breast, and colon cancer. She agrees to continue to take prescription Vit D @50 ,000 IU every week #4 with no refills and will follow up for routine testing of vitamin D, at least 2-3 times per year. She was informed of the risk of over-replacement of vitamin D and agrees to not increase her dose unless she discusses this with us first. Emily [name] agrees to follow up as directed.  Obesity Emily [name] is currently in the action stage of change. As such, her goal is to continue with weight loss efforts She has agreed to follow the Category 2 plan Emily [name] will continue yard work and she will start to walk for weight loss and overall health benefits. We discussed the following Behavioral Modification Strategies today: increase H2O intake (64 ounces), no skipping meals, keeping healthy foods in the home, increasing lean protein intake, decreasing simple carbohydrates, increasing vegetables, decrease eating out and work on meal planning and easy cooking plans Emily [name] will weigh herself at home before each visit.   Emily [name] has agreed to follow up with our clinic in 2 weeks. She was informed of the importance of frequent follow up visits to maximize her success with intensive lifestyle modifications for her multiple health conditions.  ALLERGIES: No Known Allergies  MEDICATIONS: Current  Outpatient Medications on File Prior to Visit  Medication Sig Dispense Refill   cholecalciferol (VITAMIN D) 1000 UNITS tablet Take 1,000 Units by mouth daily as needed.      cyclobenzaprine (FLEXERIL) 10 MG tablet Take 1 tablet (10 mg total) by mouth at bedtime. 30 tablet 3   hydrochlorothiazide (HYDRODIURIL) 25 MG  tablet TAKE 1 TABLET DAILY 90 tablet 1   losartan (COZAAR) 100 MG tablet TAKE 1 TABLET DAILY (PLEASE CALL THE OFFICE FOR AN APPOINTMENT FOR MORE REFILLS) 90 tablet 4   meloxicam (MOBIC) 7.5 MG tablet Take 7.5 mg by mouth daily. Reported on 12/04/2015  3   metoprolol succinate (TOPROL-XL) 25 MG 24 hr tablet Take 1 tablet (25 mg total) by mouth daily. Take with or immediately following a meal. 90 tablet 1   Naproxen Sodium (ALEVE) 220 MG CAPS Take 1 capsule by mouth as needed.     Omega-3 Fatty Acids (FISH OIL PO) Take 1 tablet by mouth as needed.      omeprazole (PRILOSEC) 20 MG capsule Take 20 mg by mouth daily as needed.     venlafaxine XR (EFFEXOR-XR) 150 MG 24 hr capsule TAKE 1 CAPSULE DAILY WITH BREAKFAST 14 capsule 0   venlafaxine XR (EFFEXOR-XR) 75 MG 24 hr capsule TAKE 1 CAPSULE DAILY WITH BREAKFAST 14 capsule 0   No current facility-administered medications on file prior to visit.     PAST MEDICAL HISTORY: Past Medical History:  Diagnosis Date   Anxiety    Back pain    Chest pain    a. 10/2013 Neg ETT; b. 10/2015 Refused MV->Cath: nl cors, nl EF.   Degenerative disc disease, lumbar    Depression    Essential hypertension    Fatigue    GERD (gastroesophageal reflux disease)    Hypothyroidism    Joint pain in fingers of both hands    Morbid obesity (HCC)    Myalgia     PAST SURGICAL HISTORY: Past Surgical History:  Procedure Laterality Date   CARDIAC CATHETERIZATION N/A 11/14/2015   Procedure: Left Heart Cath and Coronary Angiography;  Surgeon: Iran Ouch, MD;  [facility]: [facility];  Service: Cardiovascular;  Laterality: N/A;   CARPAL TUNNEL RELEASE Left 07/03/2015   Procedure: CARPAL TUNNEL RELEASE;  Surgeon: Myra Rude, MD;  Location: [facility];  Service: Orthopedics;  Laterality: Left;   CARPAL TUNNEL RELEASE Right 09/07/2015   Procedure: CARPAL TUNNEL RELEASE;  Surgeon: Myra Rude, MD;  Location: [facility];  Service:  Orthopedics;  Laterality: Right;   CHOLECYSTECTOMY     KNEE SURGERY Right    TUBAL LIGATION      SOCIAL HISTORY: Social History   Tobacco Use   Smoking status: Former Smoker    Packs/day: 0.25    Years: 10.00    Pack years: 2.50    Types: Cigarettes    Last attempt to quit: 07/02/1995    Years since quitting: 23.6   Smokeless tobacco: Never Used  Substance Use Topics   Alcohol use: No    Alcohol/week: 0.0 standard drinks   Drug use: No    FAMILY HISTORY: Family History  Problem Relation Age of Onset   Stroke Mother    Heart failure Mother    Heart disease Mother    Hypertension Mother    Thyroid disease Mother    Depression Mother    Anxiety disorder Mother    Obesity Mother    Heart attack Father 57   Hypertension Father  Hyperlipidemia Father    Alzheimer's disease Father    Heart disease Father    Obesity Father    CAD Cousin 58       passed   Cancer Paternal Uncle        prostate   Diabetes Maternal Grandmother    Diabetes Paternal Grandmother     ROS: Review of Systems  Constitutional: Positive for weight loss.  Gastrointestinal: Negative for nausea and vomiting.  Musculoskeletal:       Negative for muscle weakness  Endo/Heme/Allergies:       Negative for hot or cold intolerance    PHYSICAL EXAM: Pt in no acute distress  RECENT LABS AND TESTS: BMET    Component Value Date/Time   NA 143 08/26/2018 1422   NA 139 10/24/2014 0708   K 3.8 08/26/2018 1422   K 3.9 10/24/2014 0708   CL 101 08/26/2018 1422   CL 106 10/24/2014 0708   CO2 25 08/26/2018 1422   CO2 27 10/24/2014 0708   GLUCOSE 102 (H) 08/26/2018 1422   GLUCOSE 97 11/12/2015 1827   GLUCOSE 108 (H) 10/24/2014 0708   BUN 19 08/26/2018 1422   BUN 21 (H) 10/24/2014 0708   CREATININE 0.84 08/26/2018 1422   CREATININE 0.76 10/24/2014 0708   CALCIUM 9.6 08/26/2018 1422   CALCIUM 8.8 10/24/2014 0708   GFRNONAA 76 08/26/2018 1422   GFRNONAA >60 10/24/2014  0708   GFRAA 88 08/26/2018 1422   GFRAA >60 10/24/2014 0708   Lab Results  Component Value Date   HGBA1C 5.7 (H) 11/17/2018   HGBA1C 5.6 08/26/2018   Lab Results  Component Value Date   INSULIN 33.8 (H) 11/17/2018   CBC    Component Value Date/Time   WBC 6.9 08/26/2018 1422   WBC 7.5 11/12/2015 1827   RBC 4.25 08/26/2018 1422   RBC 4.44 11/12/2015 1827   HGB 13.5 08/26/2018 1422   HCT 38.7 08/26/2018 1422   PLT 277 08/26/2018 1422   MCV 91 08/26/2018 1422   MCV 94 10/24/2014 0708   MCH 31.8 08/26/2018 1422   MCH 30.9 11/12/2015 1827   MCHC 34.9 08/26/2018 1422   MCHC 33.7 11/12/2015 1827   RDW 12.5 08/26/2018 1422   RDW 13.2 10/24/2014 0708   LYMPHSABS 2.8 08/26/2018 1422   EOSABS 0.2 08/26/2018 1422   BASOSABS 0.1 08/26/2018 1422   Iron/TIBC/Ferritin/ %Sat No results found for: IRON, TIBC, FERRITIN, IRONPCTSAT Lipid Panel     Component Value Date/Time   CHOL 219 (H) 08/26/2018 1422   TRIG 294 (H) 08/26/2018 1422   HDL 37 (L) 08/26/2018 1422   LDLCALC 123 (H) 08/26/2018 1422   Hepatic Function Panel     Component Value Date/Time   PROT 7.1 08/26/2018 1422   ALBUMIN 4.3 08/26/2018 1422   AST 21 08/26/2018 1422   ALT 30 08/26/2018 1422   ALKPHOS 76 08/26/2018 1422   BILITOT 0.3 08/26/2018 1422      Component Value Date/Time   TSH 6.930 (H) 11/17/2018 1049   TSH 5.930 (H) 08/26/2018 1422   TSH 2.580 12/22/2017 1315     Ref. Range 11/17/2018 10:49  Vitamin D, 25-Hydroxy Latest Ref Range: 30.0 - 100.0 ng/mL 17.1 (L)    I, [place] Crane, am acting as Energy manager for [employer]. Manson Passey, DO  I have reviewed the above documentation for accuracy and completeness, and I agree with the above. -Corinna Capra, DO

## 2019-02-24 ENCOUNTER — Encounter (INDEPENDENT_AMBULATORY_CARE_PROVIDER_SITE_OTHER): Payer: Self-pay | Admitting: Bariatrics

## 2019-02-24 ENCOUNTER — Other Ambulatory Visit: Payer: Self-pay

## 2019-02-24 ENCOUNTER — Ambulatory Visit (INDEPENDENT_AMBULATORY_CARE_PROVIDER_SITE_OTHER): Payer: BLUE CROSS/BLUE SHIELD | Admitting: Bariatrics

## 2019-02-24 DIAGNOSIS — Z6837 Body mass index (BMI) 37.0-37.9, adult: Secondary | ICD-10-CM

## 2019-02-24 DIAGNOSIS — E559 Vitamin D deficiency, unspecified: Secondary | ICD-10-CM | POA: Diagnosis not present

## 2019-02-24 DIAGNOSIS — E781 Pure hyperglyceridemia: Secondary | ICD-10-CM

## 2019-02-24 DIAGNOSIS — E038 Other specified hypothyroidism: Secondary | ICD-10-CM

## 2019-02-24 DIAGNOSIS — R7303 Prediabetes: Secondary | ICD-10-CM | POA: Diagnosis not present

## 2019-02-28 NOTE — Progress Notes (Signed)
Office: [phone]  /  Fax: [phone] TeleHealth Visit:  Emily [name] has verbally consented to this TeleHealth visit today. The patient is located at home, the provider is located at the [facility] office. The participants in this visit include the listed provider, patient, husband Tinnie Gens and any and all parties involved. The visit was conducted today via WebEx.  HPI:   Chief Complaint: OBESITY Emily [name] is here to discuss her progress with her obesity treatment plan. She is on the Category 2 plan and is following her eating plan approximately 70 % of the time. She states she is doing garden and yard work 4 to 5 hours 4 times per week. Emily [name] states that she has lost 2 pounds (weight 211 lbs). She is painting for activities. We were unable to weigh the patient today for this TeleHealth visit. She feels as if she has lost weight since her last visit. She has lost 13 lbs since starting treatment with [name].  Hypothyroidism Emily [name] has a diagnosis of hypothyroidism. She is on synthroid. Emily [name] has increased energy and she denies fatigue. Her last TSH level was elevated.   Vitamin D deficiency Emily [name] has a diagnosis of vitamin D deficiency. She is currently taking vit D and denies nausea, vomiting or muscle weakness.  Elevated Triglycerides Emily [name] has elevated triglycerides and she is not on medications. Emily [name] has been trying to improve her triglyceride levels with intensive lifestyle modification including a low saturated fat diet, exercise and weight loss. She denies myalgias.  Pre-Diabetes Emily [name] has a diagnosis of prediabetes based on her elevated Hgb A1c and was informed this puts her at greater risk of developing diabetes. Last A1c was at 5.7 and last insulin level was at 33.8 She is not taking metformin currently and continues to work on diet and exercise to decrease risk of diabetes. She denies polyphagia.  ASSESSMENT AND PLAN:  Other specified hypothyroidism - Plan: T3, T4, free,  TSH  Vitamin D deficiency - Plan: VITAMIN D 25 Hydroxy (Vit-D Deficiency, Fractures)  Prediabetes - Plan: Lipid Panel With LDL/HDL Ratio, Insulin, random, Hemoglobin A1c, Comprehensive metabolic panel  Hypertriglyceridemia, essential  Class 2 severe obesity with serious comorbidity and body mass index (BMI) of 37.0 to 37.9 in adult, unspecified obesity type (HCC)  PLAN:  Hypothyroidism Biance was informed of the importance of good thyroid control to help with weight loss efforts. She was also informed that supertheraputic thyroid levels are dangerous and will not improve weight loss results.   Vitamin D Deficiency Emily [name] was informed that low vitamin D levels contributes to fatigue and are associated with obesity, breast, and colon cancer. She agrees to continue to take prescription Vit D ,000 IU every week and will follow up for routine testing of vitamin D, at least 2-3 times per year. She was informed of the risk of over-replacement of vitamin D and agrees to not increase her dose unless she discusses this with [name] first. We will check vitamin D level and Emily [name] agrees to follow up as directed.  Elevated Triglycerides Emily [name] was informed of the American Heart Association Guidelines emphasizing intensive lifestyle modifications as the first line treatment for elevated triglycerides. We discussed many lifestyle modifications today in depth, and Emily [name] will continue to work on decreasing carbohydrates, saturated fats such as fatty red meat, butter and many fried foods. She will also increase MUFA's, PUFA's, vegetables and lean protein in her diet and continue to work on exercise and weight loss efforts.  Pre-Diabetes Emily [name] will  continue to work on weight loss, exercise, and decreasing simple carbohydrates in her diet to help decrease the risk of diabetes. We dicussed metformin including benefits and risks. She was informed that eating too many simple carbohydrates or too many calories at one sitting  increases the likelihood of GI side effects. We will check insulin level and Hgb A1c and Emily [name] will follow up with [name] as directed to monitor her progress.  Obesity Emily [name] is currently in the action stage of change. As such, her goal is to continue with weight loss efforts She has agreed to follow the Category 2 plan Emily [name] will continue exercise for weight loss and overall health benefits. We discussed the following Behavioral Modification Strategies today: increase H2O intake, no skipping meals, keeping healthy foods in the home, increasing lean protein intake, decreasing simple carbohydrates, increasing vegetables, decrease eating out and work on meal planning and easy cooking plans Emily [name] will weigh herself at home before each visit.  Emily [name] has agreed to follow up with our clinic in 2 weeks. She was informed of the importance of frequent follow up visits to maximize her success with intensive lifestyle modifications for her multiple health conditions.  ALLERGIES: No Known Allergies  MEDICATIONS: Current Outpatient Medications on File Prior to Visit  Medication Sig Dispense Refill  . cholecalciferol (VITAMIN D) 1000 UNITS tablet Take 1,000 Units by mouth daily as needed.     . cyclobenzaprine (FLEXERIL) 10 MG tablet Take 1 tablet (10 mg total) by mouth at bedtime. 30 tablet 3  . hydrochlorothiazide (HYDRODIURIL) 25 MG tablet TAKE 1 TABLET DAILY 90 tablet 1  . levothyroxine (SYNTHROID) 137 MCG tablet Take 1 tablet (137 mcg total) by mouth daily before breakfast. 30 tablet 0  . losartan (COZAAR) 100 MG tablet TAKE 1 TABLET DAILY (PLEASE CALL THE OFFICE FOR AN APPOINTMENT FOR MORE REFILLS) 90 tablet 4  . meloxicam (MOBIC) 7.5 MG tablet Take 7.5 mg by mouth daily. Reported on 12/04/2015  3  . metoprolol succinate (TOPROL-XL) 25 MG 24 hr tablet Take 1 tablet (25 mg total) by mouth daily. Take with or immediately following a meal. 90 tablet 1  . Naproxen Sodium (ALEVE) 220 MG CAPS Take 1 capsule by mouth  as needed.    . Omega-3 Fatty Acids (FISH OIL PO) Take 1 tablet by mouth as needed.     Marland Kitchen omeprazole (PRILOSEC) 20 MG capsule Take 20 mg by mouth daily as needed.    . venlafaxine XR (EFFEXOR-XR) 150 MG 24 hr capsule TAKE 1 CAPSULE DAILY WITH BREAKFAST 14 capsule 0  . venlafaxine XR (EFFEXOR-XR) 75 MG 24 hr capsule TAKE 1 CAPSULE DAILY WITH BREAKFAST 14 capsule 0  . Vitamin D, Ergocalciferol, (DRISDOL) 1.25 MG (50000 UT) CAPS capsule Take 1 capsule (50,000 Units total) by mouth every 7 (seven) days. 4 capsule 0   No current facility-administered medications on file prior to visit.     PAST MEDICAL HISTORY: Past Medical History:  Diagnosis Date  . Anxiety   . Back pain   . Chest pain    a. 10/2013 Neg ETT; b. 10/2015 Refused MV->Cath: nl cors, nl EF.  . Degenerative disc disease, lumbar   . Depression   . Essential hypertension   . Fatigue   . GERD (gastroesophageal reflux disease)   . Hypothyroidism   . Joint pain in fingers of both hands   . Morbid obesity (HCC)   . Myalgia     PAST SURGICAL HISTORY: Past Surgical History:  Procedure Laterality  Date  . CARDIAC CATHETERIZATION N/A 11/14/2015   Procedure: Left Heart Cath and Coronary Angiography;  Surgeon: Iran OuchMuhammad A Arida, MD;  Location: [facility];  Service: Cardiovascular;  Laterality: N/A;  . CARPAL TUNNEL RELEASE Left 07/03/2015   Procedure: CARPAL TUNNEL RELEASE;  Surgeon: Myra Rudehristopher Smith, MD;  Location: [facility];  Service: Orthopedics;  Laterality: Left;  . CARPAL TUNNEL RELEASE Right 09/07/2015   Procedure: CARPAL TUNNEL RELEASE;  Surgeon: Myra Rudehristopher Smith, MD;  Location: [facility] OR[facility];  Service: Orthopedics;  Laterality: Right;  . CHOLECYSTECTOMY    . KNEE SURGERY Right   . TUBAL LIGATION      SOCIAL HISTORY: Social History   Tobacco Use  . Smoking status: Former Smoker    Packs/day: 0.25    Years: 10.00    Pack years: 2.50    Types: Cigarettes    Last attempt to quit: 07/02/1995    Years since quitting:  23.6  . Smokeless tobacco: Never Used  Substance Use Topics  . Alcohol use: No    Alcohol/week: 0.0 standard drinks  . Drug use: No    FAMILY HISTORY: Family History  Problem Relation Age of Onset  . Stroke Mother   . Heart failure Mother   . Heart disease Mother   . Hypertension Mother   . Thyroid disease Mother   . Depression Mother   . Anxiety disorder Mother   . Obesity Mother   . Heart attack Father [id number]  . Hypertension Father   . Hyperlipidemia Father   . Alzheimer's disease Father   . Heart disease Father   . Obesity Father   . CAD Cousin 53       passed  . Cancer Paternal Uncle        prostate  . Diabetes Maternal Grandmother   . Diabetes Paternal Grandmother     ROS: Review of Systems  Constitutional: Positive for weight loss. Negative for malaise/fatigue.  Gastrointestinal: Negative for nausea and vomiting.  Musculoskeletal: Negative for myalgias.       Negative for muscle weakness  Endo/Heme/Allergies:       Negative for polyphagia    PHYSICAL EXAM: Pt in no acute distress  RECENT LABS AND TESTS: BMET    Component Value Date/Time   NA 143 08/26/2018 1422   NA 139 10/24/2014 0708   K 3.8 08/26/2018 1422   K 3.9 10/24/2014 0708   CL 101 08/26/2018 1422   CL 106 10/24/2014 0708   CO2 25 08/26/2018 1422   CO2 27 10/24/2014 0708   GLUCOSE 102 (H) 08/26/2018 1422   GLUCOSE 97 11/12/2015 1827   GLUCOSE 108 (H) 10/24/2014 0708   BUN 19 08/26/2018 1422   BUN 21 (H) 10/24/2014 0708   CREATININE 0.84 08/26/2018 1422   CREATININE 0.76 10/24/2014 0708   CALCIUM 9.6 08/26/2018 1422   CALCIUM 8.8 10/24/2014 0708   GFRNONAA 76 08/26/2018 1422   GFRNONAA >60 10/24/2014 0708   GFRAA 88 08/26/2018 1422   GFRAA >60 10/24/2014 0708   Lab Results  Component Value Date   HGBA1C 5.7 (H) 11/17/2018   HGBA1C 5.6 08/26/2018   Lab Results  Component Value Date   INSULIN 33.8 (H) 11/17/2018   CBC    Component Value Date/Time   WBC 6.9 08/26/2018 1422    WBC 7.5 11/12/2015 1827   RBC 4.25 08/26/2018 1422   RBC 4.44 11/12/2015 1827   HGB 13.5 08/26/2018 1422   HCT 38.7 08/26/2018 1422   PLT 277 08/26/2018 1422  MCV 91 08/26/2018 1422   MCV 94 10/24/2014 0708   MCH 31.8 08/26/2018 1422   MCH 30.9 11/12/2015 1827   MCHC 34.9 08/26/2018 1422   MCHC 33.7 11/12/2015 1827   RDW 12.5 08/26/2018 1422   RDW 13.2 10/24/2014 0708   LYMPHSABS 2.8 08/26/2018 1422   EOSABS 0.2 08/26/2018 1422   BASOSABS 0.1 08/26/2018 1422   Iron/TIBC/Ferritin/ %Sat No results found for: IRON, TIBC, FERRITIN, IRONPCTSAT Lipid Panel     Component Value Date/Time   CHOL 219 (H) 08/26/2018 1422   TRIG 294 (H) 08/26/2018 1422   HDL 37 (L) 08/26/2018 1422   LDLCALC 123 (H) 08/26/2018 1422   Hepatic Function Panel     Component Value Date/Time   PROT 7.1 08/26/2018 1422   ALBUMIN 4.3 08/26/2018 1422   AST 21 08/26/2018 1422   ALT 30 08/26/2018 1422   ALKPHOS 76 08/26/2018 1422   BILITOT 0.3 08/26/2018 1422      Component Value Date/Time   TSH 6.930 (H) 11/17/2018 1049   TSH 5.930 (H) 08/26/2018 1422   TSH 2.580 12/22/2017 1315     Ref. Range 11/17/2018 10:49  Vitamin D, 25-Hydroxy Latest Ref Range: 30.0 - 100.0 ng/mL 17.1 (L)    I, [place] Crane, am acting as Energy manager for [employer]. Manson Passey, DO  I have reviewed the above documentation for accuracy and completeness, and I agree with the above. -Corinna Capra, DO

## 2019-03-10 ENCOUNTER — Other Ambulatory Visit: Payer: Self-pay

## 2019-03-10 ENCOUNTER — Other Ambulatory Visit (INDEPENDENT_AMBULATORY_CARE_PROVIDER_SITE_OTHER): Payer: Self-pay | Admitting: Bariatrics

## 2019-03-10 ENCOUNTER — Ambulatory Visit (INDEPENDENT_AMBULATORY_CARE_PROVIDER_SITE_OTHER): Payer: BLUE CROSS/BLUE SHIELD | Admitting: Bariatrics

## 2019-03-10 ENCOUNTER — Encounter (INDEPENDENT_AMBULATORY_CARE_PROVIDER_SITE_OTHER): Payer: Self-pay | Admitting: Bariatrics

## 2019-03-10 DIAGNOSIS — E038 Other specified hypothyroidism: Secondary | ICD-10-CM | POA: Diagnosis not present

## 2019-03-10 DIAGNOSIS — E559 Vitamin D deficiency, unspecified: Secondary | ICD-10-CM

## 2019-03-10 DIAGNOSIS — Z6837 Body mass index (BMI) 37.0-37.9, adult: Secondary | ICD-10-CM

## 2019-03-10 DIAGNOSIS — R7303 Prediabetes: Secondary | ICD-10-CM

## 2019-03-10 MED ORDER — VITAMIN D (ERGOCALCIFEROL) 1.25 MG (50000 UNIT) PO CAPS: 50000.0000 [IU] | ORAL_CAPSULE | ORAL | 0 refills | Status: DC

## 2019-03-11 ENCOUNTER — Other Ambulatory Visit: Payer: Self-pay | Admitting: Family Medicine

## 2019-03-11 NOTE — Telephone Encounter (Signed)
Requested medication (s) are due for refill today: yes  Requested medication (s) are on the active medication list: yes  Last refill: 02/05/2019  Future visit scheduled:no  Notes to clinic: multiple doses on file (125 mcg and 17 mcg)    Requested Prescriptions  Pending Prescriptions Disp Refills   levothyroxine (SYNTHROID) 125 MCG tablet [Pharmacy Med Name: LEVOTHYROXINE 0.125MG  ( ) TAB] 30 tablet 1    Sig: TAKE 1 TABLET(125 MCG) BY MOUTH DAILY BEFORE BREAKFAST     Endocrinology:  Hypothyroid Agents Failed - 03/11/2019  7:24 AM      Failed - TSH needs to be rechecked within 3 months after an abnormal result. Refill until TSH is due.      Failed - TSH in normal range and within 360 days    TSH  Date Value Ref Range Status  11/17/2018 6.930 (H) 0.450 - 4.500 uIU/mL Final         Passed - Valid encounter within last 12 months    Recent Outpatient Visits          6 months ago Moderate episode of recurrent major depressive disorder [facility])   [facility] Hansen, Megan P, DO   1 year ago Moderate episode of recurrent major depressive disorder (HCC)   [facility] [place], Megan P, DO   1 year ago Folliculitis   [facility] [place], Megan P, DO   1 year ago Seborrheic dermatitis of scalp   [facility] [place], Reynolds, DO   1 year ago Abscess   [facility] Gabriel Cirri, NP

## 2019-03-15 NOTE — Progress Notes (Signed)
Office: [phone]  /  Fax: [phone] TeleHealth Visit:  Emily [name] has verbally consented to this TeleHealth visit today. The patient is located at home, the provider is located at the [employer] [facility] office. The participants in this visit include the listed provider and patient and any and all parties involved. The visit was conducted today via WebEx.  HPI:   Chief Complaint: OBESITY Ophia is here to discuss her progress with her obesity treatment plan. She is on the Category 2 plan and is following her eating plan approximately 80 % of the time. She states she is walking 20 minutes 4 to 5 times per week. Reesa states that her weight remains the same (weight 211 lbs). She occasionally has some boredom or stress. We were unable to weigh the patient today for this TeleHealth visit. She feels as if she has maintained weight since her last visit. She has lost 13 lbs since starting treatment with [name].  Vitamin D deficiency Salwa has a diagnosis of vitamin D deficiency. She is currently taking vit D and denies nausea, vomiting or muscle weakness.  Pre-Diabetes Brayden has a diagnosis of prediabetes based on her elevated Hgb A1c and was informed this puts her at greater risk of developing diabetes. She is not taking metformin currently and continues to work on diet and exercise to decrease risk of diabetes. Her appetite is normal. She denies hypoglycemia.  Hypothyroidism Kahlan has a diagnosis of hypothyroidism. She is on Synthroid. Her last TSH was at 6.930 She denies temperature changes or excessive fatigue.  ASSESSMENT AND PLAN:  Vitamin D deficiency - Plan: VITAMIN D 25 Hydroxy (Vit-D Deficiency, Fractures), Vitamin D, Ergocalciferol, (DRISDOL) 1.25 MG (50000 UT) CAPS capsule  Prediabetes - Plan: Comprehensive metabolic panel, Hemoglobin A1c, Insulin, random  Other specified hypothyroidism - Plan: T3, T4, free, TSH  Class 2 severe obesity with serious comorbidity and body  mass index (BMI) of 37.0 to 37.9 in adult, unspecified obesity type (HCC)  PLAN:  Vitamin D Deficiency Lashunda was informed that low vitamin D levels contributes to fatigue and are associated with obesity, breast, and colon cancer. She agrees to continue to take prescription Vit D ,000 IU every week #4 with no refills  and will follow up for routine testing of vitamin D, at least 2-3 times per year. She was informed of the risk of over-replacement of vitamin D and agrees to not increase her dose unless she discusses this with [name] first. We will check vitamin D level and Allona will follow up as directed.  Pre-Diabetes Melissaann will continue to work on weight loss, exercise, increasing lean protein and decreasing simple carbohydrates in her diet to help decrease the risk of diabetes. She was informed that eating too many simple carbohydrates or too many calories at one sitting increases the likelihood of GI side effects. Tyianna agreed to follow up with [name] as directed to monitor her progress.  Hypothyroidism Ananya was informed of the importance of good thyroid control to help with weight loss efforts. She was also informed that supertheraputic thyroid levels are dangerous and will not improve weight loss results. We will check thyroid panel. Synthroid adjustment was made. Nyemah will continue Synthroid and follow up as directed.  Obesity Dalia is currently in the action stage of change. As such, her goal is to continue with weight loss efforts She has agreed to follow the Category 2 plan Shareen will continue walking and she will increase walking for weight loss and overall  health benefits. We discussed the following Behavioral Modification Strategies today: planning for success, increase H2O intake, no skipping meals, keeping healthy foods in the home, increasing lean protein intake, decreasing simple carbohydrates, increasing vegetables, decrease eating out and work on meal planning and easy cooking plans Trinadee  will weigh herself at home and record before each visit.  Loveda has agreed to follow up with our clinic in 2 weeks. She was informed of the importance of frequent follow up visits to maximize her success with intensive lifestyle modifications for her multiple health conditions.  ALLERGIES: No Known Allergies  MEDICATIONS: Current Outpatient Medications on File Prior to Visit  Medication Sig Dispense Refill  . cholecalciferol (VITAMIN D) 1000 UNITS tablet Take 1,000 Units by mouth daily as needed.     . cyclobenzaprine (FLEXERIL) 10 MG tablet Take 1 tablet (10 mg total) by mouth at bedtime. 30 tablet 3  . hydrochlorothiazide (HYDRODIURIL) 25 MG tablet TAKE 1 TABLET DAILY 90 tablet 1  . levothyroxine (SYNTHROID) 137 MCG tablet Take 1 tablet (137 mcg total) by mouth daily before breakfast. 30 tablet 0  . losartan (COZAAR) 100 MG tablet TAKE 1 TABLET DAILY (PLEASE CALL THE OFFICE FOR AN APPOINTMENT FOR MORE REFILLS) 90 tablet 4  . meloxicam (MOBIC) 7.5 MG tablet Take 7.5 mg by mouth daily. Reported on 12/04/2015  3  . metoprolol succinate (TOPROL-XL) 25 MG 24 hr tablet Take 1 tablet (25 mg total) by mouth daily. Take with or immediately following a meal. 90 tablet 1  . Naproxen Sodium (ALEVE) 220 MG CAPS Take 1 capsule by mouth as needed.    . Omega-3 Fatty Acids (FISH OIL PO) Take 1 tablet by mouth as needed.     Marland Kitchen omeprazole (PRILOSEC) 20 MG capsule Take 20 mg by mouth daily as needed.    . venlafaxine XR (EFFEXOR-XR) 150 MG 24 hr capsule TAKE 1 CAPSULE DAILY WITH BREAKFAST 14 capsule 0  . venlafaxine XR (EFFEXOR-XR) 75 MG 24 hr capsule TAKE 1 CAPSULE DAILY WITH BREAKFAST 14 capsule 0   No current facility-administered medications on file prior to visit.     PAST MEDICAL HISTORY: Past Medical History:  Diagnosis Date  . Anxiety   . Back pain   . Chest pain    a. 10/2013 Neg ETT; b. 10/2015 Refused MV->Cath: nl cors, nl EF.  . Degenerative disc disease, lumbar   . Depression   . Essential  hypertension   . Fatigue   . GERD (gastroesophageal reflux disease)   . Hypothyroidism   . Joint pain in fingers of both hands   . Morbid obesity (HCC)   . Myalgia     PAST SURGICAL HISTORY: Past Surgical History:  Procedure Laterality Date  . CARDIAC CATHETERIZATION N/A 11/14/2015   Procedure: Left Heart Cath and Coronary Angiography;  Surgeon: Iran Ouch, MD;  Location: [facility];  Service: Cardiovascular;  Laterality: N/A;  . CARPAL TUNNEL RELEASE Left 07/03/2015   Procedure: CARPAL TUNNEL RELEASE;  Surgeon: Myra Rude, MD;  Location: [facility] OR[facility];  Service: Orthopedics;  Laterality: Left;  . CARPAL TUNNEL RELEASE Right 09/07/2015   Procedure: CARPAL TUNNEL RELEASE;  Surgeon: Myra Rude, MD;  Location: [facility] OR[facility];  Service: Orthopedics;  Laterality: Right;  . CHOLECYSTECTOMY    . KNEE SURGERY Right   . TUBAL LIGATION      SOCIAL HISTORY: Social History   Tobacco Use  . Smoking status: Former Smoker    Packs/day: 0.25    Years: 10.00  Pack years: 2.50    Types: Cigarettes    Last attempt to quit: 07/02/1995    Years since quitting: 23.7  . Smokeless tobacco: Never Used  Substance Use Topics  . Alcohol use: No    Alcohol/week: 0.0 standard drinks  . Drug use: No    FAMILY HISTORY: Family History  Problem Relation Age of Onset  . Stroke Mother   . Heart failure Mother   . Heart disease Mother   . Hypertension Mother   . Thyroid disease Mother   . Depression Mother   . Anxiety disorder Mother   . Obesity Mother   . Heart attack Father [id number]  . Hypertension Father   . Hyperlipidemia Father   . Alzheimer's disease Father   . Heart disease Father   . Obesity Father   . CAD Cousin 53       passed  . Cancer Paternal Uncle        prostate  . Diabetes Maternal Grandmother   . Diabetes Paternal Grandmother     ROS: Review of Systems  Constitutional: Negative for malaise/fatigue and weight loss.  Gastrointestinal: Negative for nausea and  vomiting.  Musculoskeletal:       Negative for muscle weakness  Endo/Heme/Allergies:       Negative for polyphagia Negative for hypoglycemia Negative for heat or cold intolerance    PHYSICAL EXAM: Pt in no acute distress  RECENT LABS AND TESTS: BMET    Component Value Date/Time   NA 143 08/26/2018 1422   NA 139 10/24/2014 0708   K 3.8 08/26/2018 1422   K 3.9 10/24/2014 0708   CL 101 08/26/2018 1422   CL 106 10/24/2014 0708   CO2 25 08/26/2018 1422   CO2 27 10/24/2014 0708   GLUCOSE 102 (H) 08/26/2018 1422   GLUCOSE 97 11/12/2015 1827   GLUCOSE 108 (H) 10/24/2014 0708   BUN 19 08/26/2018 1422   BUN 21 (H) 10/24/2014 0708   CREATININE 0.84 08/26/2018 1422   CREATININE 0.76 10/24/2014 0708   CALCIUM 9.6 08/26/2018 1422   CALCIUM 8.8 10/24/2014 0708   GFRNONAA 76 08/26/2018 1422   GFRNONAA >60 10/24/2014 0708   GFRAA 88 08/26/2018 1422   GFRAA >60 10/24/2014 0708   Lab Results  Component Value Date   HGBA1C 5.7 (H) 11/17/2018   HGBA1C 5.6 08/26/2018   Lab Results  Component Value Date   INSULIN 33.8 (H) 11/17/2018   CBC    Component Value Date/Time   WBC 6.9 08/26/2018 1422   WBC 7.5 11/12/2015 1827   RBC 4.25 08/26/2018 1422   RBC 4.44 11/12/2015 1827   HGB 13.5 08/26/2018 1422   HCT 38.7 08/26/2018 1422   PLT 277 08/26/2018 1422   MCV 91 08/26/2018 1422   MCV 94 10/24/2014 0708   MCH 31.8 08/26/2018 1422   MCH 30.9 11/12/2015 1827   MCHC 34.9 08/26/2018 1422   MCHC 33.7 11/12/2015 1827   RDW 12.5 08/26/2018 1422   RDW 13.2 10/24/2014 0708   LYMPHSABS 2.8 08/26/2018 1422   EOSABS 0.2 08/26/2018 1422   BASOSABS 0.1 08/26/2018 1422   Iron/TIBC/Ferritin/ %Sat No results found for: IRON, TIBC, FERRITIN, IRONPCTSAT Lipid Panel     Component Value Date/Time   CHOL 219 (H) 08/26/2018 1422   TRIG 294 (H) 08/26/2018 1422   HDL 37 (L) 08/26/2018 1422   LDLCALC 123 (H) 08/26/2018 1422   Hepatic Function Panel     Component Value Date/Time   PROT 7.1  08/26/2018  1422   ALBUMIN 4.3 08/26/2018 1422   AST 21 08/26/2018 1422   ALT 30 08/26/2018 1422   ALKPHOS 76 08/26/2018 1422   BILITOT 0.3 08/26/2018 1422      Component Value Date/Time   TSH 6.930 (H) 11/17/2018 1049   TSH 5.930 (H) 08/26/2018 1422   TSH 2.580 12/22/2017 1315     Ref. Range 11/17/2018 10:49  Vitamin D, 25-Hydroxy Latest Ref Range: 30.0 - 100.0 ng/mL 17.1 (L)    I, [place] Crane, am acting as Energy manager for [employer]. Manson Passey, DO  I have reviewed the above documentation for accuracy and completeness, and I agree with the above. -Corinna Capra, DO

## 2019-03-24 ENCOUNTER — Other Ambulatory Visit: Payer: Self-pay

## 2019-03-24 ENCOUNTER — Ambulatory Visit (INDEPENDENT_AMBULATORY_CARE_PROVIDER_SITE_OTHER): Payer: BC Managed Care – PPO | Admitting: Bariatrics

## 2019-03-24 ENCOUNTER — Encounter (INDEPENDENT_AMBULATORY_CARE_PROVIDER_SITE_OTHER): Payer: Self-pay | Admitting: Bariatrics

## 2019-03-24 DIAGNOSIS — Z6837 Body mass index (BMI) 37.0-37.9, adult: Secondary | ICD-10-CM | POA: Diagnosis not present

## 2019-03-24 DIAGNOSIS — E559 Vitamin D deficiency, unspecified: Secondary | ICD-10-CM

## 2019-03-24 DIAGNOSIS — E038 Other specified hypothyroidism: Secondary | ICD-10-CM

## 2019-03-24 DIAGNOSIS — E66812 Obesity, class 2: Secondary | ICD-10-CM

## 2019-03-24 MED ORDER — LEVOTHYROXINE SODIUM 137 MCG PO TABS: 137.0000 ug | ORAL_TABLET | Freq: Every day | ORAL | 0 refills | Status: DC

## 2019-03-24 MED ORDER — VITAMIN D (ERGOCALCIFEROL) 1.25 MG (50000 UNIT) PO CAPS: 50000.0000 [IU] | ORAL_CAPSULE | ORAL | 0 refills | Status: DC

## 2019-03-28 ENCOUNTER — Encounter (INDEPENDENT_AMBULATORY_CARE_PROVIDER_SITE_OTHER): Payer: Self-pay | Admitting: Bariatrics

## 2019-03-28 NOTE — Progress Notes (Signed)
Office: [phone]  /  Fax: [phone] TeleHealth Visit:  Emily [name] has verbally consented to this TeleHealth visit today. The patient is located at home, the provider is located at the [facility] office. The participants in this visit include the listed provider and patient and any and all parties involved. The visit was conducted today via WebEx.  HPI:   Chief Complaint: OBESITY Emily [name] is here to discuss her progress with her obesity treatment plan. She is on the Category 2 plan and is following her eating plan approximately 80 % of the time. She states she is walking and gardening for 60 minutes 5 times per week. Emily [name] states that she has gained 1 pound. She is getting adequate water and protein. We were unable to weigh the patient today for this TeleHealth visit. She feels as if she has gained weight since her last visit. She has lost 15 lbs since starting treatment with us.  Vitamin D deficiency Emily [name] has a diagnosis of vitamin D deficiency. She is currently taking vit D and denies nausea, vomiting or muscle weakness.  Hypothyroidism Emily [name] has a diagnosis of hypothyroidism. She is on levothyroxine. She denies hot or cold intolerance or excessive fatigue.  ASSESSMENT AND PLAN:  Vitamin D deficiency - Plan: Vitamin D, Ergocalciferol, (DRISDOL) 1.25 MG (50000 UT) CAPS capsule  Other specified hypothyroidism - Plan: levothyroxine (SYNTHROID) 137 MCG tablet  Class 2 severe obesity with serious comorbidity and body mass index (BMI) of 37.0 to 37.9 in adult, unspecified obesity type (HCC)  PLAN:  Vitamin D Deficiency Emily [name] was informed that low vitamin D levels contributes to fatigue and are associated with obesity, breast, and colon cancer. She agrees to continue to take prescription Vit D @50 ,000 IU every week #4 with no refills and will follow up for routine testing of vitamin D, at least 2-3 times per year. She was informed of the risk of over-replacement of  vitamin D and agrees to not increase her dose unless she discusses this with us first. Emily [name] agrees to follow up as directed.  Hypothyroidism Emily [name] was informed of the importance of good thyroid control to help with weight loss efforts. She was also informed that supertheraputic thyroid levels are dangerous and will not improve weight loss results. Emily [name] agrees to continue Levothyroxine 137 mcg once daily #30 with no refills.  Obesity Emily [name] is currently in the action stage of change. As such, her goal is to continue with weight loss efforts She has agreed to follow the Category 2 plan Emily [name] has been instructed to work up to a goal of 150 minutes of combined cardio and strengthening exercise or arm exercises (bands, and small exercises) per week for weight loss and overall health benefits. We discussed the following Behavioral Modification Strategies today: increase H2O intake, no skipping meals, keeping healthy foods in the home, increasing lean protein intake, decreasing simple carbohydrates, increasing vegetables, decrease eating out and work on meal planning and intentional eating Emily [name] will get her labs before the next visit.  Emily [name] has agreed to follow up with our clinic in 2 weeks. She was informed of the importance of frequent follow up visits to maximize her success with intensive lifestyle modifications for her multiple health conditions.  ALLERGIES: No Known Allergies  MEDICATIONS: Current Outpatient Medications on File Prior to Visit  Medication Sig Dispense Refill   cholecalciferol (VITAMIN D) 1000 UNITS tablet Take 1,000 Units by mouth daily as needed.      cyclobenzaprine (FLEXERIL) 10  MG tablet Take 1 tablet (10 mg total) by mouth at bedtime. 30 tablet 3   hydrochlorothiazide (HYDRODIURIL) 25 MG tablet TAKE 1 TABLET DAILY 90 tablet 1   losartan (COZAAR) 100 MG tablet TAKE 1 TABLET DAILY (PLEASE CALL THE OFFICE FOR AN APPOINTMENT FOR MORE REFILLS) 90 tablet 4   meloxicam (MOBIC)  7.5 MG tablet Take 7.5 mg by mouth daily. Reported on 12/04/2015  3   metoprolol succinate (TOPROL-XL) 25 MG 24 hr tablet Take 1 tablet (25 mg total) by mouth daily. Take with or immediately following a meal. 90 tablet 1   Naproxen Sodium (ALEVE) 220 MG CAPS Take 1 capsule by mouth as needed.     Omega-3 Fatty Acids (FISH OIL PO) Take 1 tablet by mouth as needed.      omeprazole (PRILOSEC) 20 MG capsule Take 20 mg by mouth daily as needed.     venlafaxine XR (EFFEXOR-XR) 150 MG 24 hr capsule TAKE 1 CAPSULE DAILY WITH BREAKFAST 14 capsule 0   venlafaxine XR (EFFEXOR-XR) 75 MG 24 hr capsule TAKE 1 CAPSULE DAILY WITH BREAKFAST 14 capsule 0   No current facility-administered medications on file prior to visit.     PAST MEDICAL HISTORY: Past Medical History:  Diagnosis Date   Anxiety    Back pain    Chest pain    a. 10/2013 Neg ETT; b. 10/2015 Refused MV->Cath: nl cors, nl EF.   Degenerative disc disease, lumbar    Depression    Essential hypertension    Fatigue    GERD (gastroesophageal reflux disease)    Hypothyroidism    Joint pain in fingers of both hands    Morbid obesity ([place])    Myalgia     PAST SURGICAL HISTORY: Past Surgical History:  Procedure Laterality Date   CARDIAC CATHETERIZATION N/A 11/14/2015   Procedure: Left Heart Cath and Coronary Angiography;  Surgeon: Wellington Hampshire, MD;  Location: [facility];  Service: Cardiovascular;  Laterality: N/A;   CARPAL TUNNEL RELEASE Left 07/03/2015   Procedure: CARPAL TUNNEL RELEASE;  Surgeon: Christophe Louis, MD;  Location: [facility];  Service: Orthopedics;  Laterality: Left;   CARPAL TUNNEL RELEASE Right 09/07/2015   Procedure: CARPAL TUNNEL RELEASE;  Surgeon: Christophe Louis, MD;  Location: [facility];  Service: Orthopedics;  Laterality: Right;   CHOLECYSTECTOMY     KNEE SURGERY Right    TUBAL LIGATION      SOCIAL HISTORY: Social History   Tobacco Use   Smoking status: Former Smoker     Packs/day: 0.25    Years: 10.00    Pack years: 2.50    Types: Cigarettes    Last attempt to quit: 07/02/1995    Years since quitting: 23.7   Smokeless tobacco: Never Used  Substance Use Topics   Alcohol use: No    Alcohol/week: 0.0 standard drinks   Drug use: No    FAMILY HISTORY: Family History  Problem Relation Age of Onset   Stroke Mother    Heart failure Mother    Heart disease Mother    Hypertension Mother    Thyroid disease Mother    Depression Mother    Anxiety disorder Mother    Obesity Mother    Heart attack Father 75   Hypertension Father    Hyperlipidemia Father    Alzheimer's disease Father    Heart disease Father    Obesity Father    CAD Cousin 72       passed   Cancer Paternal  Uncle        prostate   Diabetes Maternal Grandmother    Diabetes Paternal Grandmother     ROS: Review of Systems  Constitutional: Negative for malaise/fatigue and weight loss.  Gastrointestinal: Negative for nausea and vomiting.  Musculoskeletal:       Negative for muscle weakness  Endo/Heme/Allergies:       Negative for hot or cold intolerance    PHYSICAL EXAM: Pt in no acute distress  RECENT LABS AND TESTS: BMET    Component Value Date/Time   NA 143 08/26/2018 1422   NA 139 10/24/2014 0708   K 3.8 08/26/2018 1422   K 3.9 10/24/2014 0708   CL 101 08/26/2018 1422   CL 106 10/24/2014 0708   CO2 25 08/26/2018 1422   CO2 27 10/24/2014 0708   GLUCOSE 102 (H) 08/26/2018 1422   GLUCOSE 97 11/12/2015 1827   GLUCOSE 108 (H) 10/24/2014 0708   BUN 19 08/26/2018 1422   BUN 21 (H) 10/24/2014 0708   CREATININE 0.84 08/26/2018 1422   CREATININE 0.76 10/24/2014 0708   CALCIUM 9.6 08/26/2018 1422   CALCIUM 8.8 10/24/2014 0708   GFRNONAA 76 08/26/2018 1422   GFRNONAA >60 10/24/2014 0708   GFRAA 88 08/26/2018 1422   GFRAA >60 10/24/2014 0708   Lab Results  Component Value Date   HGBA1C 5.7 (H) 11/17/2018   HGBA1C 5.6 08/26/2018   Lab Results    Component Value Date   INSULIN 33.8 (H) 11/17/2018   CBC    Component Value Date/Time   WBC 6.9 08/26/2018 1422   WBC 7.5 11/12/2015 1827   RBC 4.25 08/26/2018 1422   RBC 4.44 11/12/2015 1827   HGB 13.5 08/26/2018 1422   HCT 38.7 08/26/2018 1422   PLT 277 08/26/2018 1422   MCV 91 08/26/2018 1422   MCV 94 10/24/2014 0708   MCH 31.8 08/26/2018 1422   MCH 30.9 11/12/2015 1827   MCHC 34.9 08/26/2018 1422   MCHC 33.7 11/12/2015 1827   RDW 12.5 08/26/2018 1422   RDW 13.2 10/24/2014 0708   LYMPHSABS 2.8 08/26/2018 1422   EOSABS 0.2 08/26/2018 1422   BASOSABS 0.1 08/26/2018 1422   Iron/TIBC/Ferritin/ %Sat No results found for: IRON, TIBC, FERRITIN, IRONPCTSAT Lipid Panel     Component Value Date/Time   CHOL 219 (H) 08/26/2018 1422   TRIG 294 (H) 08/26/2018 1422   HDL 37 (L) 08/26/2018 1422   LDLCALC 123 (H) 08/26/2018 1422   Hepatic Function Panel     Component Value Date/Time   PROT 7.1 08/26/2018 1422   ALBUMIN 4.3 08/26/2018 1422   AST 21 08/26/2018 1422   ALT 30 08/26/2018 1422   ALKPHOS 76 08/26/2018 1422   BILITOT 0.3 08/26/2018 1422      Component Value Date/Time   TSH 6.930 (H) 11/17/2018 1049   TSH 5.930 (H) 08/26/2018 1422   TSH 2.580 12/22/2017 1315     Ref. Range 11/17/2018 10:49  Vitamin D, 25-Hydroxy Latest Ref Range: 30.0 - 100.0 ng/mL 17.1 (L)    I, [place] CraneJoanne Murray, am acting as Energy managertranscriptionist for [employer]el A. Manson PasseyBrown, DO  I have reviewed the above documentation for accuracy and completeness, and I agree with the above. -Corinna CapraAngel Nor[place] Dani, DO

## 2019-04-06 ENCOUNTER — Ambulatory Visit (INDEPENDENT_AMBULATORY_CARE_PROVIDER_SITE_OTHER): Payer: Self-pay | Admitting: Bariatrics

## 2019-04-06 DIAGNOSIS — Z0289 Encounter for other administrative examinations: Secondary | ICD-10-CM

## 2019-04-19 ENCOUNTER — Encounter (INDEPENDENT_AMBULATORY_CARE_PROVIDER_SITE_OTHER): Payer: Self-pay | Admitting: Bariatrics

## 2019-04-29 ENCOUNTER — Encounter: Payer: Self-pay | Admitting: Family Medicine

## 2019-04-29 ENCOUNTER — Other Ambulatory Visit (INDEPENDENT_AMBULATORY_CARE_PROVIDER_SITE_OTHER): Payer: Self-pay | Admitting: Bariatrics

## 2019-04-29 DIAGNOSIS — E559 Vitamin D deficiency, unspecified: Secondary | ICD-10-CM

## 2019-04-29 DIAGNOSIS — E038 Other specified hypothyroidism: Secondary | ICD-10-CM

## 2019-05-04 ENCOUNTER — Encounter (INDEPENDENT_AMBULATORY_CARE_PROVIDER_SITE_OTHER): Payer: Self-pay | Admitting: Bariatrics

## 2019-05-04 ENCOUNTER — Ambulatory Visit (INDEPENDENT_AMBULATORY_CARE_PROVIDER_SITE_OTHER): Payer: BC Managed Care – PPO | Admitting: Bariatrics

## 2019-05-04 ENCOUNTER — Other Ambulatory Visit: Payer: Self-pay

## 2019-05-04 VITALS — BP 133/66 | HR 72 | Temp 99.1°F | Ht 64.0 in | Wt 209.0 lb

## 2019-05-04 DIAGNOSIS — E038 Other specified hypothyroidism: Secondary | ICD-10-CM

## 2019-05-04 DIAGNOSIS — Z9189 Other specified personal risk factors, not elsewhere classified: Secondary | ICD-10-CM

## 2019-05-04 DIAGNOSIS — E559 Vitamin D deficiency, unspecified: Secondary | ICD-10-CM | POA: Diagnosis not present

## 2019-05-04 DIAGNOSIS — Z6836 Body mass index (BMI) 36.0-36.9, adult: Secondary | ICD-10-CM

## 2019-05-04 MED ORDER — VITAMIN D (ERGOCALCIFEROL) 1.25 MG (50000 UNIT) PO CAPS: 50000.0000 [IU] | ORAL_CAPSULE | ORAL | 0 refills | Status: DC

## 2019-05-04 MED ORDER — LEVOTHYROXINE SODIUM 137 MCG PO TABS: 137.0000 ug | ORAL_TABLET | Freq: Every day | ORAL | 0 refills | Status: DC

## 2019-05-04 NOTE — Progress Notes (Signed)
Office: [phone]  /  Fax: [phone]   HPI:   Chief Complaint: OBESITY Emily [name] is here to discuss her progress with her obesity treatment plan. She is on the Category 2 plan and is following her eating plan approximately 50% of the time. She states she is doing yard work 60 minutes 6 times per week. Emily [name] is down 7 lbs. She reports she does not get enough water. Her weight is 209 lb (94.8 kg) today and has had a weight loss of 7 pounds over a period of 5 months since her last in-office visit. She has lost 15 lbs since starting treatment with us.  Vitamin D deficiency Emily [name] has a diagnosis of Vitamin D deficiency. She is currently taking prescription Vit D and denies nausea, vomiting or muscle weakness.  At risk for osteopenia and osteoporosis Emily [name] is at higher risk of osteopenia and osteoporosis due to vitamin D deficiency.   Hypothyroidism Emily [name] has a diagnosis of hypothyroidism. She is on levothyroxine and reports no temperature changes.  ASSESSMENT AND PLAN:  Vitamin D deficiency - Plan: Vitamin D, Ergocalciferol, (DRISDOL) 1.25 MG (50000 UT) CAPS capsule  Other specified hypothyroidism - Plan: levothyroxine (SYNTHROID) 137 MCG tablet  At risk for osteoporosis  Class 2 severe obesity with serious comorbidity and body mass index (BMI) of 36.0 to 36.9 in adult, unspecified obesity type (HCC)  PLAN:  Vitamin D Deficiency Emily [name] was informed that low Vitamin D levels contributes to fatigue and are associated with obesity, breast, and colon cancer. She agrees to continue to take prescription Vit D @ 50,000 IU every week #4 with 0 refills and will follow-up for routine testing of Vitamin D, at least 2-3 times per year. She was informed of the risk of over-replacement of Vitamin D and agrees to not increase her dose unless she discusses this with us first. Emily [name] agrees to follow-up with our clinic in 2 weeks.  At risk for osteopenia and osteoporosis Emily [name] was given extended  (15  minutes) osteoporosis prevention counseling today. Emily [name] is at risk for osteopenia and osteoporsis due to her Vitamin D deficiency. She was encouraged to take her Vitamin D and follow her higher calcium diet and increase strengthening exercise to help strengthen her bones and decrease her risk of osteopenia and osteoporosis.  Hypothyroidism Emily [name] was informed of the importance of good thyroid control to help with weight loss efforts. She was also informed that supertheraputic thyroid levels are dangerous and will not improve weight loss results. Emily [name] was given a prescription for levothyroxine 137 mcg 1 PO daily #30 with 0 refills. She agrees to follow-up with our clinic in 2 weeks.  Obesity Emily [name] is currently in the action stage of change. As such, her goal is to continue with weight loss efforts. She has agreed to follow the Category 2 plan. Emily [name] will work on meal planning, intentional eating, and will eliminate sweet tea and increase her water intake. Emily [name] is going to PT for back pain. We discussed the following Behavioral Modification Strategies today: increasing lean protein intake, decreasing simple carbohydrates, increasing vegetables, increase H20 intake, decrease eating out, no skipping meals, work on meal planning and easy cooking plans, and keeping healthy foods in the home.  Emily [name] has agreed to follow-up with our clinic in 2 weeks (fasting). She was informed of the importance of frequent follow-up visits to maximize her success with intensive lifestyle modifications for her multiple health conditions.  ALLERGIES: No Known Allergies  MEDICATIONS: Current Outpatient Medications on File Prior  to Visit  Medication Sig Dispense Refill  . cholecalciferol (VITAMIN D) 1000 UNITS tablet Take 1,000 Units by mouth daily as needed.     . cyclobenzaprine (FLEXERIL) 10 MG tablet Take 1 tablet (10 mg total) by mouth at bedtime. 30 tablet 3  . hydrochlorothiazide (HYDRODIURIL) 25 MG tablet TAKE 1  TABLET DAILY 90 tablet 1  . losartan (COZAAR) 100 MG tablet TAKE 1 TABLET DAILY (PLEASE CALL THE OFFICE FOR AN APPOINTMENT FOR MORE REFILLS) 90 tablet 4  . meloxicam (MOBIC) 7.5 MG tablet Take 7.5 mg by mouth daily. Reported on 12/04/2015  3  . metoprolol succinate (TOPROL-XL) 25 MG 24 hr tablet Take 1 tablet (25 mg total) by mouth daily. Take with or immediately following a meal. 90 tablet 1  . Naproxen Sodium (ALEVE) 220 MG CAPS Take 1 capsule by mouth as needed.    . Omega-3 Fatty Acids (FISH OIL PO) Take 1 tablet by mouth as needed.     Marland Kitchen. omeprazole (PRILOSEC) 20 MG capsule Take 20 mg by mouth daily as needed.    . venlafaxine XR (EFFEXOR-XR) 150 MG 24 hr capsule TAKE 1 CAPSULE DAILY WITH BREAKFAST 14 capsule 0  . venlafaxine XR (EFFEXOR-XR) 75 MG 24 hr capsule TAKE 1 CAPSULE DAILY WITH BREAKFAST 14 capsule 0   No current facility-administered medications on file prior to visit.     PAST MEDICAL HISTORY: Past Medical History:  Diagnosis Date  . Anxiety   . Back pain   . Chest pain    a. 10/2013 Neg ETT; b. 10/2015 Refused MV->Cath: nl cors, nl EF.  . Degenerative disc disease, lumbar   . Depression   . Essential hypertension   . Fatigue   . GERD (gastroesophageal reflux disease)   . Hypothyroidism   . Joint pain in fingers of both hands   . Morbid obesity (HCC)   . Myalgia     PAST SURGICAL HISTORY: Past Surgical History:  Procedure Laterality Date  . CARDIAC CATHETERIZATION N/A 11/14/2015   Procedure: Left Heart Cath and Coronary Angiography;  Surgeon: Iran OuchMuhammad A Arida, MD;  Location: [facility];  Service: Cardiovascular;  Laterality: N/A;  . CARPAL TUNNEL RELEASE Left 07/03/2015   Procedure: CARPAL TUNNEL RELEASE;  Surgeon: Myra Rudehristopher Smith, MD;  Location: [facility];  Service: Orthopedics;  Laterality: Left;  . CARPAL TUNNEL RELEASE Right 09/07/2015   Procedure: CARPAL TUNNEL RELEASE;  Surgeon: Myra Rudehristopher Smith, MD;  Location: [facility];  Service: Orthopedics;   Laterality: Right;  . CHOLECYSTECTOMY    . KNEE SURGERY Right   . TUBAL LIGATION      SOCIAL HISTORY: Social History   Tobacco Use  . Smoking status: Former Smoker    Packs/day: 0.25    Years: 10.00    Pack years: 2.50    Types: Cigarettes    Quit date: 07/02/1995    Years since quitting: 23.8  . Smokeless tobacco: Never Used  Substance Use Topics  . Alcohol use: No    Alcohol/week: 0.0 standard drinks  . Drug use: No    FAMILY HISTORY: Family History  Problem Relation Age of Onset  . Stroke Mother   . Heart failure Mother   . Heart disease Mother   . Hypertension Mother   . Thyroid disease Mother   . Depression Mother   . Anxiety disorder Mother   . Obesity Mother   . Heart attack Father [phone]  . Hypertension Father   . Hyperlipidemia Father   . Alzheimer's disease  Father   . Heart disease Father   . Obesity Father   . CAD Cousin 88       passed  . Cancer Paternal Uncle        prostate  . Diabetes Maternal Grandmother   . Diabetes Paternal Grandmother    ROS: Review of Systems  Gastrointestinal: Negative for nausea and vomiting.  Musculoskeletal:       Negative for muscle weakness.  Endo/Heme/Allergies:       Negative for temperature changes.   PHYSICAL EXAM: Blood pressure 133/66, pulse 72, temperature 99.1 F (37.3 C), temperature source Oral, height 5\' 4"  (1.626 m), weight 209 lb (94.8 kg), SpO2 95 %. Body mass index is 35.87 kg/m. Physical Exam Vitals signs reviewed.  Constitutional:      Appearance: Normal appearance. She is obese.  Cardiovascular:     Rate and Rhythm: Normal rate.     Pulses: Normal pulses.  Pulmonary:     Effort: Pulmonary effort is normal.     Breath sounds: Normal breath sounds.  Musculoskeletal: Normal range of motion.  Skin:    General: Skin is warm and dry.  Neurological:     Mental Status: She is alert and oriented to person, place, and time.  Psychiatric:        Behavior: Behavior normal.   RECENT LABS AND  TESTS: BMET    Component Value Date/Time   NA 143 08/26/2018 1422   NA 139 10/24/2014 0708   K 3.8 08/26/2018 1422   K 3.9 10/24/2014 0708   CL 101 08/26/2018 1422   CL 106 10/24/2014 0708   CO2 25 08/26/2018 1422   CO2 27 10/24/2014 0708   GLUCOSE 102 (H) 08/26/2018 1422   GLUCOSE 97 11/12/2015 1827   GLUCOSE 108 (H) 10/24/2014 0708   BUN 19 08/26/2018 1422   BUN 21 (H) 10/24/2014 0708   CREATININE 0.84 08/26/2018 1422   CREATININE 0.76 10/24/2014 0708   CALCIUM 9.6 08/26/2018 1422   CALCIUM 8.8 10/24/2014 0708   GFRNONAA 76 08/26/2018 1422   GFRNONAA >60 10/24/2014 0708   GFRAA 88 08/26/2018 1422   GFRAA >60 10/24/2014 0708   Lab Results  Component Value Date   HGBA1C 5.7 (H) 11/17/2018   HGBA1C 5.6 08/26/2018   Lab Results  Component Value Date   INSULIN 33.8 (H) 11/17/2018   CBC    Component Value Date/Time   WBC 6.9 08/26/2018 1422   WBC 7.5 11/12/2015 1827   RBC 4.25 08/26/2018 1422   RBC 4.44 11/12/2015 1827   HGB 13.5 08/26/2018 1422   HCT 38.7 08/26/2018 1422   PLT 277 08/26/2018 1422   MCV 91 08/26/2018 1422   MCV 94 10/24/2014 0708   MCH 31.8 08/26/2018 1422   MCH 30.9 11/12/2015 1827   MCHC 34.9 08/26/2018 1422   MCHC 33.7 11/12/2015 1827   RDW 12.5 08/26/2018 1422   RDW 13.2 10/24/2014 0708   LYMPHSABS 2.8 08/26/2018 1422   EOSABS 0.2 08/26/2018 1422   BASOSABS 0.1 08/26/2018 1422   Iron/TIBC/Ferritin/ %Sat No results found for: IRON, TIBC, FERRITIN, IRONPCTSAT Lipid Panel     Component Value Date/Time   CHOL 219 (H) 08/26/2018 1422   TRIG 294 (H) 08/26/2018 1422   HDL 37 (L) 08/26/2018 1422   LDLCALC 123 (H) 08/26/2018 1422   Hepatic Function Panel     Component Value Date/Time   PROT 7.1 08/26/2018 1422   ALBUMIN 4.3 08/26/2018 1422   AST 21 08/26/2018 1422  ALT 30 08/26/2018 1422   ALKPHOS 76 08/26/2018 1422   BILITOT 0.3 08/26/2018 1422      Component Value Date/Time   TSH 6.930 (H) 11/17/2018 1049   TSH 5.930 (H)  08/26/2018 1422   TSH 2.580 12/22/2017 1315   Results for Emily FischerMANUEL, Henleigh W (MRN [id number]) as of 05/04/2019 16:55  Ref. Range 11/17/2018 10:49  Vitamin D, 25-Hydroxy Latest Ref Range: 30.0 - 100.0 ng/mL 17.1 (L)   OBESITY BEHAVIORAL INTERVENTION VISIT  Today's visit was #8  Starting weight: 224 lbs Starting date: 11/17/2018 Today's weight: 209 lbs  Today's date: 05/04/2019 Total lbs lost to date: 15   05/04/2019  Height 5\' 4"  (1.626 m)  Weight 209 lb (94.8 kg)  BMI (Calculated) 35.86  BLOOD PRESSURE - SYSTOLIC 133  BLOOD PRESSURE - DIASTOLIC 66   Body Fat % 45.3 %  Total Body Water (lbs) 45.3 lbs   ASK: We discussed the diagnosis of obesity with Emily [name] today and Emily [name] agreed to give us permission to discuss obesity behavioral modification therapy today.  ASSESS: Emily [name] has the diagnosis of obesity and her BMI today is 36.0. Emily [name] is in the action stage of change.   ADVISE: Emily [name] was educated on the multiple health risks of obesity as well as the benefit of weight loss to improve her health. She was advised of the need for long term treatment and the importance of lifestyle modifications to improve her current health and to decrease her risk of future health problems.  AGREE: Multiple dietary modification options and treatment options were discussed and  Emily [name] agreed to follow the recommendations documented in the above note.  ARRANGE: Emily [name] was educated on the importance of frequent visits to treat obesity as outlined per CMS and USPSTF guidelines and agreed to schedule her next follow up appointment today.  Fernanda DrumI, Denise Haag, am acting as Energy managertranscriptionist for [employer] Energy[employer]el Rasool Rommel, DO  I have reviewed the above documentation for accuracy and completeness, and I agree with the above. -Corinna CapraAngel Dicie Edelen, DO

## 2019-05-05 ENCOUNTER — Encounter (INDEPENDENT_AMBULATORY_CARE_PROVIDER_SITE_OTHER): Payer: Self-pay | Admitting: Bariatrics

## 2019-05-05 MED ORDER — VITAMIN D (ERGOCALCIFEROL) 1.25 MG (50000 UNIT) PO CAPS: 50000.0000 [IU] | ORAL_CAPSULE | ORAL | 0 refills | Status: DC

## 2019-05-05 MED ORDER — LEVOTHYROXINE SODIUM 137 MCG PO TABS: 137.0000 ug | ORAL_TABLET | Freq: Every day | ORAL | 0 refills | Status: DC

## 2019-05-05 NOTE — Addendum Note (Signed)
Addended by: Lennette Bihari A on: 05/05/2019 04:17 PM   Modules accepted: Orders

## 2019-05-26 ENCOUNTER — Ambulatory Visit (INDEPENDENT_AMBULATORY_CARE_PROVIDER_SITE_OTHER): Payer: BC Managed Care – PPO | Admitting: Bariatrics

## 2019-05-27 ENCOUNTER — Encounter: Payer: Self-pay | Admitting: Family Medicine

## 2019-05-29 ENCOUNTER — Encounter (INDEPENDENT_AMBULATORY_CARE_PROVIDER_SITE_OTHER): Payer: Self-pay | Admitting: Bariatrics

## 2019-05-30 ENCOUNTER — Ambulatory Visit (INDEPENDENT_AMBULATORY_CARE_PROVIDER_SITE_OTHER): Payer: BC Managed Care – PPO | Admitting: Bariatrics

## 2019-05-30 ENCOUNTER — Encounter (INDEPENDENT_AMBULATORY_CARE_PROVIDER_SITE_OTHER): Payer: Self-pay

## 2019-06-01 DIAGNOSIS — B029 Zoster without complications: Secondary | ICD-10-CM | POA: Diagnosis not present

## 2019-06-01 DIAGNOSIS — L304 Erythema intertrigo: Secondary | ICD-10-CM | POA: Diagnosis not present

## 2019-06-01 DIAGNOSIS — L218 Other seborrheic dermatitis: Secondary | ICD-10-CM | POA: Diagnosis not present

## 2019-06-07 ENCOUNTER — Other Ambulatory Visit: Payer: Self-pay

## 2019-06-07 ENCOUNTER — Ambulatory Visit: Payer: BC Managed Care – PPO | Admitting: Family Medicine

## 2019-06-07 ENCOUNTER — Encounter: Payer: Self-pay | Admitting: Family Medicine

## 2019-06-07 VITALS — BP 135/82 | HR 75 | Temp 98.6°F | Ht 65.0 in | Wt 216.0 lb

## 2019-06-07 DIAGNOSIS — Z6835 Body mass index (BMI) 35.0-35.9, adult: Secondary | ICD-10-CM

## 2019-06-07 DIAGNOSIS — E559 Vitamin D deficiency, unspecified: Secondary | ICD-10-CM

## 2019-06-07 DIAGNOSIS — Z1239 Encounter for other screening for malignant neoplasm of breast: Secondary | ICD-10-CM

## 2019-06-07 DIAGNOSIS — F3289 Other specified depressive episodes: Secondary | ICD-10-CM

## 2019-06-07 DIAGNOSIS — R7303 Prediabetes: Secondary | ICD-10-CM

## 2019-06-07 DIAGNOSIS — I10 Essential (primary) hypertension: Secondary | ICD-10-CM | POA: Diagnosis not present

## 2019-06-07 DIAGNOSIS — E6609 Other obesity due to excess calories: Secondary | ICD-10-CM

## 2019-06-07 DIAGNOSIS — E038 Other specified hypothyroidism: Secondary | ICD-10-CM | POA: Diagnosis not present

## 2019-06-07 DIAGNOSIS — Z13220 Encounter for screening for lipoid disorders: Secondary | ICD-10-CM

## 2019-06-07 LAB — MICROALBUMIN, URINE WAIVED
Creatinine, Urine Waived: 100 mg/dL (ref 10–300)
Microalb, Ur Waived: 30 mg/L — ABNORMAL HIGH (ref 0–19)

## 2019-06-07 LAB — BAYER DCA HB A1C WAIVED: HB A1C (BAYER DCA - WAIVED): 5.6 % (ref <7.0)

## 2019-06-07 MED ORDER — MELOXICAM 7.5 MG PO TABS: 7.5000 mg | ORAL_TABLET | Freq: Every day | ORAL | 1 refills | Status: DC

## 2019-06-07 MED ORDER — LOSARTAN POTASSIUM 100 MG PO TABS: ORAL_TABLET | ORAL | 1 refills | Status: DC

## 2019-06-07 MED ORDER — HYDROCHLOROTHIAZIDE 25 MG PO TABS: 25.0000 mg | ORAL_TABLET | Freq: Every day | ORAL | 1 refills | Status: DC

## 2019-06-07 MED ORDER — VENLAFAXINE HCL ER 150 MG PO CP24: ORAL_CAPSULE | ORAL | 1 refills | Status: DC

## 2019-06-07 MED ORDER — VENLAFAXINE HCL ER 75 MG PO CP24: ORAL_CAPSULE | ORAL | 1 refills | Status: DC

## 2019-06-07 NOTE — Assessment & Plan Note (Signed)
Under good control on current regimen. Continue current regimen. Continue to monitor. Call with any concerns. Refills given. Labs drawn today.   

## 2019-06-07 NOTE — Assessment & Plan Note (Signed)
Doing well with working with medical weight loss. Continue great work. Continue to monitor.

## 2019-06-07 NOTE — Progress Notes (Signed)
BP 135/82   Pulse 75   Temp 98.6 F (37 C) (Oral)   Ht 5\' 5"  (1.651 m)   Wt 216 lb (98 kg)   SpO2 93%   BMI 35.94 kg/m    Subjective:    Patient ID: Emily [name], female    DOB: 1961/09/08, 58 y.o.   MRN: [id number]  HPI: Emily [name] is a 58 y.o. female  Chief Complaint  Patient presents with  . Hypothyroidism    levothyroxine,meloxicam, vitamin D refill  . Depression  . Hypertension   HYPERTENSION Hypertension status: controlled  Satisfied with current treatment? yes Duration of hypertension: chronic BP monitoring frequency:  not checking BP medication side effects:  no Medication compliance: excellent compliance Previous BP meds: HCTZ, losartan, metoprolol Aspirin: no Recurrent headaches: no Visual changes: no Palpitations: no Dyspnea: no Chest pain: no Lower extremity edema: no Dizzy/lightheaded: no  HYPOTHYROIDISM Thyroid control status:unsure Satisfied with current treatment? no Medication side effects: no Medication compliance: excellent compliance Recent dose adjustment:yes Fatigue: yes Cold intolerance: no Heat intolerance: no Weight gain: no Weight loss: no Constipation: no Diarrhea/loose stools: no Palpitations: no Lower extremity edema: no Anxiety/depressed mood: yes  DEPRESSION Mood status: stable Satisfied with current treatment?: yes Symptom severity: mild  Duration of current treatment : chronic Side effects: no Medication compliance: excellent compliance Psychotherapy/counseling: no  Previous psychiatric medications: effexor Depressed mood: yes Anxious mood: yes Anhedonia: no Significant weight loss or gain: no Insomnia: yes hard to fall asleep Fatigue: yes Feelings of worthlessness or guilt: yes Impaired concentration/indecisiveness: no Suicidal ideations: no Hopelessness: no Crying spells: no Depression screen [facility] 2/9 06/07/2019 11/17/2018 08/26/2018 01/29/2017 04/07/2016  Decreased Interest 1 2 2 1  0  Down, Depressed,  Hopeless 0 3 2 0 2  PHQ - 2 Score 1 5 4 1 2   Altered sleeping 1 1 2  - 3  Tired, decreased energy 3 3 3  - 3  Change in appetite 0 2 1 - 2  Feeling bad or failure about yourself  0 1 1 - 1  Trouble concentrating 1 2 2  - 3  Moving slowly or fidgety/restless 0 0 0 - 0  Suicidal thoughts 0 0 0 - 0  PHQ-9 Score 6 14 13  - 14  Difficult doing work/chores Somewhat difficult Not difficult at all Very difficult - Somewhat difficult   GAD 7 : Generalized Anxiety Score 06/07/2019  Nervous, Anxious, on Edge 1  Control/stop worrying 1  Worry too much - different things 1  Trouble relaxing 1  Restless 0  Easily annoyed or irritable 0  Afraid - awful might happen 1  Total GAD 7 Score 5  Anxiety Difficulty Not difficult at all    Relevant past medical, surgical, family and social history reviewed and updated as indicated. Interim medical history since our last visit reviewed. Allergies and medications reviewed and updated.  Review of Systems  Constitutional: Negative.   Respiratory: Negative.   Cardiovascular: Negative.   Musculoskeletal: Negative.   Neurological: Negative.   Psychiatric/Behavioral: Negative.     Per HPI unless specifically indicated above     Objective:    BP 135/82   Pulse 75   Temp 98.6 F (37 C) (Oral)   Ht 5\' 5"  (1.651 m)   Wt 216 lb (98 kg)   SpO2 93%   BMI 35.94 kg/m   Wt Readings from Last 3 Encounters:  06/07/19 216 lb (98 kg)  05/04/19 209 lb (94.8 kg)  12/16/18 216 lb (98 kg)  Physical Exam Vitals signs and nursing note reviewed.  Constitutional:      General: She is not in acute distress.    Appearance: Normal appearance. She is not ill-appearing, toxic-appearing or diaphoretic.  HENT:     Head: Normocephalic and atraumatic.     Right Ear: External ear normal.     Left Ear: External ear normal.     Nose: Nose normal.     Mouth/Throat:     Mouth: Mucous membranes are moist.     Pharynx: Oropharynx is clear.  Eyes:     General: No scleral  icterus.       Right eye: No discharge.        Left eye: No discharge.     Extraocular Movements: Extraocular movements intact.     Conjunctiva/sclera: Conjunctivae normal.     Pupils: Pupils are equal, round, and reactive to light.  Neck:     Musculoskeletal: Normal range of motion and neck supple.  Cardiovascular:     Rate and Rhythm: Normal rate and regular rhythm.     Pulses: Normal pulses.     Heart sounds: Normal heart sounds. No murmur. No friction rub. No gallop.   Pulmonary:     Effort: Pulmonary effort is normal. No respiratory distress.     Breath sounds: Normal breath sounds. No stridor. No wheezing, rhonchi or rales.  Chest:     Chest wall: No tenderness.  Musculoskeletal: Normal range of motion.  Skin:    General: Skin is warm and dry.     Capillary Refill: Capillary refill takes less than 2 seconds.     Coloration: Skin is not jaundiced or pale.     Findings: No bruising, erythema, lesion or rash.  Neurological:     General: No focal deficit present.     Mental Status: She is alert and oriented to person, place, and time. Mental status is at baseline.  Psychiatric:        Mood and Affect: Mood normal.        Behavior: Behavior normal.        Thought Content: Thought content normal.        Judgment: Judgment normal.     Results for orders placed or performed in visit on 11/17/18  Hemoglobin A1c  Result Value Ref Range   Hgb A1c MFr Bld 5.7 (H) 4.8 - 5.6 %   Est. average glucose Bld gHb Est-mCnc 117 mg/dL  Insulin, random  Result Value Ref Range   INSULIN 33.8 (H) 2.6 - 24.9 uIU/mL  VITAMIN D 25 Hydroxy (Vit-D Deficiency, Fractures)  Result Value Ref Range   Vit D, 25-Hydroxy 17.1 (L) 30.0 - 100.0 ng/mL  T3  Result Value Ref Range   T3, Total 94 71 - 180 ng/dL  T4, free  Result Value Ref Range   Free T4 1.15 0.82 - 1.77 ng/dL  TSH  Result Value Ref Range   TSH 6.930 (H) 0.450 - 4.500 uIU/mL      Assessment & Plan:   Problem List Items Addressed  This Visit      Cardiovascular and Mediastinum   Essential hypertension - Primary    Under good control on current regimen. Continue current regimen. Continue to monitor. Call with any concerns. Refills given. Labs drawn today.       Relevant Medications   losartan (COZAAR) 100 MG tablet   hydrochlorothiazide (HYDRODIURIL) 25 MG tablet   Other Relevant Orders   Comprehensive metabolic panel   Microalbumin, Urine  Waived   CBC with Differential/Platelet     Endocrine   Hypothyroidism    Rechecking levels today. Await results. Will treat as needed. Call with any concerns.       Relevant Orders   Comprehensive metabolic panel   TSH   CBC with Differential/Platelet     Other   Depression    Under good control on current regimen. Continue current regimen. Continue to monitor. Call with any concerns. Refills given. Labs drawn today.      Relevant Medications   venlafaxine XR (EFFEXOR-XR) 75 MG 24 hr capsule   venlafaxine XR (EFFEXOR-XR) 150 MG 24 hr capsule   Other Relevant Orders   Comprehensive metabolic panel   TSH   CBC with Differential/Platelet   Class 2 obesity due to excess calories without serious comorbidity with body mass index (BMI) of 35.0 to 35.9 in adult    Doing well with working with medical weight loss. Continue great work. Continue to monitor.       Vitamin D deficiency    Rechecking levels today. Await results. Call with any concerns.       Relevant Orders   Comprehensive metabolic panel   VITAMIN D 25 Hydroxy (Vit-D Deficiency, Fractures)   CBC with Differential/Platelet   Prediabetes    Under good control with A1c of 5.6- continue current regimen. Continue to monitor. Call with any concerns.       Relevant Orders   Comprehensive metabolic panel   Microalbumin, Urine Waived   Bayer DCA Hb A1c Waived   CBC with Differential/Platelet    Other Visit Diagnoses    Screening for cholesterol level       Labs drawn today. Await results.    Relevant  Orders   Lipid Panel w/o Chol/HDL Ratio   Breast cancer screening       Mammogram ordered.    Relevant Orders   MM DIGITAL SCREENING BILATERAL       Follow up plan: Return in about 6 months (around 12/08/2019) for physical.

## 2019-06-07 NOTE — Assessment & Plan Note (Signed)
Rechecking levels today. Await results. Call with any concerns.  

## 2019-06-07 NOTE — Assessment & Plan Note (Signed)
Under good control with A1c of 5.6- continue current regimen. Continue to monitor. Call with any concerns.  

## 2019-06-07 NOTE — Assessment & Plan Note (Signed)
Rechecking levels today. Await results. Will treat as needed. Call with any concerns.  

## 2019-06-08 LAB — CBC WITH DIFFERENTIAL/PLATELET
Basophils Absolute: 0.1 10*3/uL (ref 0.0–0.2)
Basos: 1 %
EOS (ABSOLUTE): 0.2 10*3/uL (ref 0.0–0.4)
Eos: 3 %
Hematocrit: 39.9 % (ref 34.0–46.6)
Hemoglobin: 13.4 g/dL (ref 11.1–15.9)
Immature Grans (Abs): 0 10*3/uL (ref 0.0–0.1)
Immature Granulocytes: 0 %
Lymphocytes Absolute: 3.2 10*3/uL — ABNORMAL HIGH (ref 0.7–3.1)
Lymphs: 42 %
MCH: 30.5 pg (ref 26.6–33.0)
MCHC: 33.6 g/dL (ref 31.5–35.7)
MCV: 91 fL (ref 79–97)
Monocytes Absolute: 0.6 10*3/uL (ref 0.1–0.9)
Monocytes: 8 %
Neutrophils Absolute: 3.5 10*3/uL (ref 1.4–7.0)
Neutrophils: 46 %
Platelets: 305 10*3/uL (ref 150–450)
RBC: 4.39 x10E6/uL (ref 3.77–5.28)
RDW: 12.4 % (ref 11.7–15.4)
WBC: 7.6 10*3/uL (ref 3.4–10.8)

## 2019-06-08 LAB — COMPREHENSIVE METABOLIC PANEL
ALT: 22 IU/L (ref 0–32)
AST: 20 IU/L (ref 0–40)
Albumin/Globulin Ratio: 1.2 (ref 1.2–2.2)
Albumin: 4.2 g/dL (ref 3.8–4.9)
Alkaline Phosphatase: 83 IU/L (ref 39–117)
BUN/Creatinine Ratio: 31 — ABNORMAL HIGH (ref 9–23)
BUN: 23 mg/dL (ref 6–24)
Bilirubin Total: 0.3 mg/dL (ref 0.0–1.2)
CO2: 25 mmol/L (ref 20–29)
Calcium: 9.9 mg/dL (ref 8.7–10.2)
Chloride: 99 mmol/L (ref 96–106)
Creatinine, Ser: 0.74 mg/dL (ref 0.57–1.00)
GFR calc Af Amer: 103 mL/min/{1.73_m2} (ref >59)
GFR calc non Af Amer: 89 mL/min/{1.73_m2} (ref >59)
Globulin, Total: 3.5 g/dL (ref 1.5–4.5)
Glucose: 87 mg/dL (ref 65–99)
Potassium: 4 mmol/L (ref 3.5–5.2)
Sodium: 138 mmol/L (ref 134–144)
Total Protein: 7.7 g/dL (ref 6.0–8.5)

## 2019-06-08 LAB — LIPID PANEL W/O CHOL/HDL RATIO
Cholesterol, Total: 227 mg/dL — ABNORMAL HIGH (ref 100–199)
HDL: 51 mg/dL (ref >39)
LDL Calculated: 154 mg/dL — ABNORMAL HIGH (ref 0–99)
Triglycerides: 108 mg/dL (ref 0–149)
VLDL Cholesterol Cal: 22 mg/dL (ref 5–40)

## 2019-06-08 LAB — VITAMIN D 25 HYDROXY (VIT D DEFICIENCY, FRACTURES): Vit D, 25-Hydroxy: 34.3 ng/mL (ref 30.0–100.0)

## 2019-06-08 LAB — TSH: TSH: 1.49 u[IU]/mL (ref 0.450–4.500)

## 2019-06-13 ENCOUNTER — Encounter: Payer: Self-pay | Admitting: Family Medicine

## 2019-06-14 ENCOUNTER — Encounter: Payer: Self-pay | Admitting: Family Medicine

## 2019-06-14 DIAGNOSIS — E038 Other specified hypothyroidism: Secondary | ICD-10-CM

## 2019-06-14 MED ORDER — LEVOTHYROXINE SODIUM 137 MCG PO TABS: 137.0000 ug | ORAL_TABLET | Freq: Every day | ORAL | 3 refills | Status: DC

## 2019-07-28 ENCOUNTER — Ambulatory Visit: Payer: BC Managed Care – PPO | Admitting: Family Medicine

## 2019-08-03 ENCOUNTER — Ambulatory Visit (INDEPENDENT_AMBULATORY_CARE_PROVIDER_SITE_OTHER): Payer: BC Managed Care – PPO | Admitting: Bariatrics

## 2019-08-04 ENCOUNTER — Ambulatory Visit: Payer: BC Managed Care – PPO | Admitting: Family Medicine

## 2019-11-09 ENCOUNTER — Other Ambulatory Visit (INDEPENDENT_AMBULATORY_CARE_PROVIDER_SITE_OTHER): Payer: Self-pay | Admitting: Bariatrics

## 2019-11-09 DIAGNOSIS — E559 Vitamin D deficiency, unspecified: Secondary | ICD-10-CM

## 2019-11-15 ENCOUNTER — Other Ambulatory Visit: Payer: Self-pay | Admitting: Family Medicine

## 2019-11-16 ENCOUNTER — Other Ambulatory Visit: Payer: Self-pay | Admitting: Family Medicine

## 2019-12-04 ENCOUNTER — Other Ambulatory Visit: Payer: Self-pay | Admitting: Family Medicine

## 2019-12-04 DIAGNOSIS — I10 Essential (primary) hypertension: Secondary | ICD-10-CM

## 2019-12-04 NOTE — Telephone Encounter (Signed)
Requested Prescriptions  Pending Prescriptions Disp Refills  . hydrochlorothiazide (HYDRODIURIL) 25 MG tablet [Pharmacy Med Name: HYDROCHLOROTHIAZIDE TABS 25MG ] 90 tablet 3    Sig: TAKE 1 TABLET DAILY     Cardiovascular: Diuretics - Thiazide Passed - 12/04/2019  6:55 AM      Passed - Ca in normal range and within 360 days    Calcium  Date Value Ref Range Status  06/07/2019 9.9 8.7 - 10.2 mg/dL Final   Calcium, Total  Date Value Ref Range Status  10/24/2014 8.8 8.5 - 10.1 mg/dL Final         Passed - Cr in normal range and within 360 days    Creatinine  Date Value Ref Range Status  10/24/2014 0.76 0.60 - 1.30 mg/dL Final   Creatinine, Ser  Date Value Ref Range Status  06/07/2019 0.74 0.57 - 1.00 mg/dL Final         Passed - K in normal range and within 360 days    Potassium  Date Value Ref Range Status  06/07/2019 4.0 3.5 - 5.2 mmol/L Final  10/24/2014 3.9 3.5 - 5.1 mmol/L Final         Passed - Na in normal range and within 360 days    Sodium  Date Value Ref Range Status  06/07/2019 138 134 - 144 mmol/L Final  10/24/2014 139 136 - 145 mmol/L Final         Passed - Last BP in normal range    BP Readings from Last 1 Encounters:  06/07/19 135/82         Passed - Valid encounter within last 6 months    Recent Outpatient Visits          6 months ago Essential hypertension   [facility] [place], Megan P, DO   1 year ago Moderate episode of recurrent major depressive disorder (HCC)   [facility] [place], Megan P, DO   1 year ago Moderate episode of recurrent major depressive disorder (HCC)   [facility] [place], Megan P, DO   2 years ago Folliculitis   [place], [place], DO   2 years ago Seborrheic dermatitis of scalp   [facility] [place], [place], DO      Future Appointments            In 4 days [place] y[place], Laural Benes, DO [facility], [facility]           . venlafaxine XR  (EFFEXOR-XR) 150 MG 24 hr capsule [Pharmacy Med Name: VENLAFAXINE HCL ER CAPS 150MG ] 90 capsule 3    Sig: TAKE 1 CAPSULE DAILY WITH BREAKFAST     Psychiatry: Antidepressants - SNRI - desvenlafaxine & venlafaxine Failed - 12/04/2019  6:55 AM      Failed - LDL in normal range and within 360 days    LDL Calculated  Date Value Ref Range Status  06/07/2019 154 (H) 0 - 99 mg/dL Final         Failed - Total Cholesterol in normal range and within 360 days    Cholesterol, Total  Date Value Ref Range Status  06/07/2019 227 (H) 100 - 199 mg/dL Final         Passed - Triglycerides in normal range and within 360 days    Triglycerides  Date Value Ref Range Status  06/07/2019 108 0 - 149 mg/dL Final         Passed - Completed PHQ-2 or PHQ-9 in  the last 360 days.      Passed - Last BP in normal range    BP Readings from Last 1 Encounters:  06/07/19 135/82         Passed - Valid encounter within last 6 months    Recent Outpatient Visits          6 months ago Essential hypertension   [place], Megan P, DO   1 year ago Moderate episode of recurrent major depressive disorder [facility])   [place], Megan P, DO   1 year ago Moderate episode of recurrent major depressive disorder ([place])   [place], [place], DO   2 years ago [place], [place], DO   2 years ago Seborrheic dermatitis of scalp   [place], [place], DO      Future Appointments            In 4 days Wynetta Emery, Barb Merino, DO [employer], [place]           . venlafaxine XR (EFFEXOR-XR) 75 MG 24 hr capsule [Pharmacy Med Name: VENLAFAXINE HCL ER CAPS 75MG ] 90 capsule 3    Sig: TAKE 1 CAPSULE DAILY WITH BREAKFAST     Psychiatry: Antidepressants - SNRI - desvenlafaxine & venlafaxine Failed - 12/04/2019  6:55 AM      Failed - LDL in normal range and within 360 days    LDL Calculated  Date Value Ref  Range Status  06/07/2019 154 (H) 0 - 99 mg/dL Final         Failed - Total Cholesterol in normal range and within 360 days    Cholesterol, Total  Date Value Ref Range Status  06/07/2019 227 (H) 100 - 199 mg/dL Final         Passed - Triglycerides in normal range and within 360 days    Triglycerides  Date Value Ref Range Status  06/07/2019 108 0 - 149 mg/dL Final         Passed - Completed PHQ-2 or PHQ-9 in the last 360 days.      Passed - Last BP in normal range    BP Readings from Last 1 Encounters:  06/07/19 135/82         Passed - Valid encounter within last 6 months    Recent Outpatient Visits          6 months ago Essential hypertension   [place], Megan P, DO   1 year ago Moderate episode of recurrent major depressive disorder [facility])   [place], Megan P, DO   1 year ago Moderate episode of recurrent major depressive disorder ([place])   [place], [place], DO   2 years ago [place], [place], DO   2 years ago Seborrheic dermatitis of scalp   [place], [place], DO      Future Appointments            In 4 days Wynetta Emery, Barb Merino, DO [employer], [facility]

## 2019-12-08 ENCOUNTER — Encounter: Payer: BC Managed Care – PPO | Admitting: Family Medicine

## 2019-12-22 DIAGNOSIS — M4722 Other spondylosis with radiculopathy, cervical region: Secondary | ICD-10-CM | POA: Diagnosis not present

## 2020-01-03 DIAGNOSIS — M5416 Radiculopathy, lumbar region: Secondary | ICD-10-CM | POA: Diagnosis not present

## 2020-01-12 ENCOUNTER — Encounter: Payer: Self-pay | Admitting: Family Medicine

## 2020-02-06 ENCOUNTER — Encounter: Payer: Self-pay | Admitting: Family Medicine

## 2020-02-13 ENCOUNTER — Other Ambulatory Visit: Payer: Self-pay | Admitting: Family Medicine

## 2020-02-13 NOTE — Telephone Encounter (Signed)
Requested Prescriptions  Pending Prescriptions Disp Refills  . losartan (COZAAR) 100 MG tablet [Pharmacy Med Name: LOSARTAN TABS 100MG ] 30 tablet 0    Sig: TAKE 1 TABLET DAILY (PLEASE CALL THE OFFICE FOR AN APPOINTMENT FOR MORE REFILLS)     Cardiovascular:  Angiotensin Receptor Blockers Failed - 02/13/2020 12:32 AM      Failed - Cr in normal range and within 180 days    Creatinine  Date Value Ref Range Status  10/24/2014 0.76 0.60 - 1.30 mg/dL Final   Creatinine, Ser  Date Value Ref Range Status  06/07/2019 0.74 0.57 - 1.00 mg/dL Final         Failed - K in normal range and within 180 days    Potassium  Date Value Ref Range Status  06/07/2019 4.0 3.5 - 5.2 mmol/L Final  10/24/2014 3.9 3.5 - 5.1 mmol/L Final         Failed - Valid encounter within last 6 months    Recent Outpatient Visits          8 months ago Essential hypertension   [facility] [place], Megan P, DO   1 year ago Moderate episode of recurrent major depressive disorder (HCC)   [facility] [place], Megan P, DO   2 years ago Moderate episode of recurrent major depressive disorder (HCC)   [facility] [place], [place], DO   2 years ago Folliculitis   [place], [place], DO   2 years ago Seborrheic dermatitis of scalp   [facility] [place], [place], DO      Future Appointments            In 2 weeks [place], Megan P, DO [facility], [facility]           Passed - Patient is not pregnant      Passed - Last BP in normal range    BP Readings from Last 1 Encounters:  06/07/19 135/82

## 2020-02-28 ENCOUNTER — Encounter: Payer: Self-pay | Admitting: Family Medicine

## 2020-02-28 ENCOUNTER — Other Ambulatory Visit: Payer: Self-pay

## 2020-02-28 ENCOUNTER — Ambulatory Visit (INDEPENDENT_AMBULATORY_CARE_PROVIDER_SITE_OTHER): Payer: BC Managed Care – PPO | Admitting: Family Medicine

## 2020-02-28 VITALS — BP 149/89 | HR 92 | Temp 98.7°F | Ht 62.99 in | Wt 227.8 lb

## 2020-02-28 DIAGNOSIS — F3289 Other specified depressive episodes: Secondary | ICD-10-CM

## 2020-02-28 DIAGNOSIS — E6609 Other obesity due to excess calories: Secondary | ICD-10-CM

## 2020-02-28 DIAGNOSIS — F419 Anxiety disorder, unspecified: Secondary | ICD-10-CM | POA: Diagnosis not present

## 2020-02-28 DIAGNOSIS — E559 Vitamin D deficiency, unspecified: Secondary | ICD-10-CM

## 2020-02-28 DIAGNOSIS — Z1211 Encounter for screening for malignant neoplasm of colon: Secondary | ICD-10-CM

## 2020-02-28 DIAGNOSIS — Z23 Encounter for immunization: Secondary | ICD-10-CM

## 2020-02-28 DIAGNOSIS — I10 Essential (primary) hypertension: Secondary | ICD-10-CM | POA: Diagnosis not present

## 2020-02-28 DIAGNOSIS — E038 Other specified hypothyroidism: Secondary | ICD-10-CM

## 2020-02-28 DIAGNOSIS — R002 Palpitations: Secondary | ICD-10-CM | POA: Diagnosis not present

## 2020-02-28 DIAGNOSIS — Z0000 Encounter for general adult medical examination without abnormal findings: Secondary | ICD-10-CM | POA: Diagnosis not present

## 2020-02-28 DIAGNOSIS — Z6835 Body mass index (BMI) 35.0-35.9, adult: Secondary | ICD-10-CM

## 2020-02-28 DIAGNOSIS — R8281 Pyuria: Secondary | ICD-10-CM

## 2020-02-28 DIAGNOSIS — R7303 Prediabetes: Secondary | ICD-10-CM

## 2020-02-28 DIAGNOSIS — Z1231 Encounter for screening mammogram for malignant neoplasm of breast: Secondary | ICD-10-CM

## 2020-02-28 LAB — URINALYSIS, ROUTINE W REFLEX MICROSCOPIC
Bilirubin, UA: NEGATIVE
Glucose, UA: NEGATIVE
Ketones, UA: NEGATIVE
Nitrite, UA: NEGATIVE
Protein,UA: NEGATIVE
RBC, UA: NEGATIVE
Specific Gravity, UA: 1.02 (ref 1.005–1.030)
Urobilinogen, Ur: 0.2 mg/dL (ref 0.2–1.0)
pH, UA: 6.5 (ref 5.0–7.5)

## 2020-02-28 LAB — MICROSCOPIC EXAMINATION
Bacteria, UA: NONE SEEN
RBC, Urine: NONE SEEN /hpf (ref 0–2)

## 2020-02-28 LAB — MICROALBUMIN, URINE WAIVED
Creatinine, Urine Waived: 100 mg/dL (ref 10–300)
Microalb, Ur Waived: 80 mg/L — ABNORMAL HIGH (ref 0–19)

## 2020-02-28 LAB — BAYER DCA HB A1C WAIVED: HB A1C (BAYER DCA - WAIVED): 5.6 % (ref <7.0)

## 2020-02-28 NOTE — Progress Notes (Signed)
BP (!) 149/89 (BP Location: Left Arm, Patient Position: Sitting, Cuff Size: Normal)   Pulse 92   Temp 98.7 F (37.1 C) (Oral)   Ht 5' 2.99" (1.6 m)   Wt 227 lb 12.8 oz (103.3 kg)   SpO2 98%   BMI 40.36 kg/m    Subjective:    Patient ID: Emily [name], female    DOB: 01/06/1961, 59 y.o.   MRN: [id number]  HPI: Emily [name] is a 59 y.o. female presenting on 02/28/2020 for comprehensive medical examination. Current medical complaints include:  PALPITATIONS Duration: 2-3 months Symptom description: heart racing with SOB  Duration of episode: few minutes because she sits down Frequency: recurrentl Activity when event occurred: exercising Related to exertion: yes Dyspnea: yes Chest pain: no Syncope: no Anxiety/stress: yes Nausea/vomiting: no Diaphoresis: yes Coronary artery disease: no Congestive heart failure: no Arrhythmia:no Thyroid disease: yes Caffeine intake: 1-2 cups a day in the AM Status:  stable Treatments attempted:none  Impaired Fasting Glucose HbA1C:  Lab Results  Component Value Date   HGBA1C 5.6 02/28/2020   Duration of elevated blood sugar: chronic Polydipsia: no Polyuria: no Weight change: yes Visual disturbance: no Glucose Monitoring: no Diabetic Education: Not Completed Family history of diabetes: yes  HYPERTENSION Hypertension status: uncontrolled  Satisfied with current treatment? yes Duration of hypertension: chronic BP monitoring frequency:  not checking BP medication side effects:  no Medication compliance: excellent compliance Previous BP meds: Aspirin: no Recurrent headaches: no Visual changes: no Palpitations: yes Dyspnea: yes Chest pain: no Lower extremity edema: no Dizzy/lightheaded: no  HYPOTHYROIDISM Thyroid control status:controlled Satisfied with current treatment? yes Medication side effects: no Medication compliance: excellent compliance Etiology of hypothyroidism:  Recent dose adjustment:no Fatigue: no Cold  intolerance: no Heat intolerance: no Weight gain: yes Weight loss: no Constipation: no Diarrhea/loose stools: no Palpitations: no Lower extremity edema: no Anxiety/depressed mood: yes  ANXIETY/DEPRESSION Duration:stable Anxious mood: yes  Excessive worrying: yes Irritability: no  Sweating: no Nausea: no Palpitations:yes Hyperventilation: no Panic attacks: no Agoraphobia: no  Obscessions/compulsions: no Depressed mood: yes Depression screen [facility] 2/9 02/28/2020 06/07/2019 11/17/2018 08/26/2018 01/29/2017  Decreased Interest 0 Down, Depressed, Hopeless 0 0 3 2 0  PHQ - 2 Score 0 Altered sleeping -  Tired, decreased energy 0 -  Change in appetite 0 0 2 1 -  Feeling bad or failure about yourself  0 0 1 1 -  Trouble concentrating -  Moving slowly or fidgety/restless 0 0 0 0 -  Suicidal thoughts 0 0 0 0 -  PHQ-9 Score -  Difficult doing work/chores - Somewhat difficult Not difficult at all Very difficult -   Anhedonia: no Weight changes: yes Insomnia: no   Hypersomnia: no Fatigue/loss of energy: yes Feelings of worthlessness: no Feelings of guilt: no Impaired concentration/indecisiveness: no Suicidal ideations: no  Crying spells: no Recent Stressors/Life Changes: no   Relationship problems: no   Family stress: no     Financial stress: no    Job stress: no    Recent death/loss: no  She currently lives with: husband Menopausal Symptoms: night sweats  Depression Screen done today and results listed below:  Depression screen [facility] 2/9 02/28/2020 06/07/2019 11/17/2018 08/26/2018 01/29/2017  Decreased Interest 0 Down, Depressed, Hopeless 0 0 3 2 0  PHQ - 2 Score 0 1 5 4  1  Altered sleeping 1 1 1 2  -  Tired, decreased energy 0 3 3 3  -  Change in appetite 0 0 2 1 -  Feeling bad or failure about yourself  0 0 1 1 -  Trouble concentrating 1 1 2 2  -  Moving slowly or fidgety/restless 0 0 0 0 -  Suicidal thoughts 0 0 0 0 -   PHQ-9 Score 2 6 14 13  -  Difficult doing work/chores - Somewhat difficult Not difficult at all Very difficult -    Past Medical History:  Past Medical History:  Diagnosis Date  . Anxiety   . Back pain   . Chest pain    a. 10/2013 Neg ETT; b. 10/2015 Refused MV->Cath: nl cors, nl EF.  . Degenerative disc disease, lumbar   . Depression   . Essential hypertension   . Fatigue   . GERD (gastroesophageal reflux disease)   . Hypothyroidism   . Joint pain in fingers of both hands   . Morbid obesity (HCC)   . Myalgia     Surgical History:  Past Surgical History:  Procedure Laterality Date  . CARDIAC CATHETERIZATION N/A 11/14/2015   Procedure: Left Heart Cath and Coronary Angiography;  Surgeon: , MD;  Location: [facility];  Service: Cardiovascular;  Laterality: N/A;  . CARPAL TUNNEL RELEASE Left 07/03/2015   Procedure: CARPAL TUNNEL RELEASE;  Surgeon: 11/2015, MD;  Location: [facility] ORS;  Service: Orthopedics;  Laterality: Left;  . CARPAL TUNNEL RELEASE Right 09/07/2015   Procedure: CARPAL TUNNEL RELEASE;  Surgeon: Iran Ouch, MD;  Location: [facility] ORS;  Service: Orthopedics;  Laterality: Right;  . CHOLECYSTECTOMY    . KNEE SURGERY Right   . TUBAL LIGATION      Medications:  Current Outpatient Medications on File Prior to Visit  Medication Sig  . fluticasone (CUTIVATE) 0.05 % cream fluticasone propionate 0.05 % topical cream  . hydrochlorothiazide (HYDRODIURIL) 25 MG tablet TAKE 1 TABLET DAILY  . ketoconazole (NIZORAL) 2 % cream ketoconazole 2 % topical cream  . levothyroxine (SYNTHROID) 137 MCG tablet Take 1 tablet (137 mcg total) by mouth daily before breakfast.  . losartan (COZAAR) 100 MG tablet TAKE 1 TABLET DAILY (PLEASE CALL THE OFFICE FOR AN APPOINTMENT FOR MORE REFILLS)  . meloxicam (MOBIC) 7.5 MG tablet TAKE 1 TABLET DAILY  . methocarbamol (ROBAXIN) 500 MG tablet Take 500 mg by mouth as needed.  . Omega-3 Fatty Acids (FISH OIL) 500 MG  CAPS Take by mouth.  . venlafaxine XR (EFFEXOR-XR) 150 MG 24 hr capsule TAKE 1 CAPSULE DAILY WITH BREAKFAST  . venlafaxine XR (EFFEXOR-XR) 75 MG 24 hr capsule TAKE 1 CAPSULE DAILY WITH BREAKFAST  . cyclobenzaprine (FLEXERIL) 10 MG tablet Take 10 mg by mouth as needed.  . Naproxen Sodium (ALEVE) 220 MG CAPS Take 1 capsule by mouth as needed.  07/05/2015 omeprazole (PRILOSEC) 20 MG capsule Take 20 mg by mouth daily as needed.   No current facility-administered medications on file prior to visit.    Allergies:  No Known Allergies  Social History:  Social History   Socioeconomic History  . Marital status: Married    Spouse name: Myra Rude  . Number of children: Not on file  . Years of education: Not on file  . Highest education level: Not on file  Occupational History  . Occupation: retired from 09/09/2015 [employer] 30 yrs of service  Tobacco Use  . Smoking status: Former Smoker    Packs/day: 0.25  Years: 10.00    Pack years: 2.50    Types: Cigarettes    Quit date: 07/02/1995    Years since quitting: 24.6  . Smokeless tobacco: Never Used  Substance and Sexual Activity  . Alcohol use: No    Alcohol/week: 0.0 standard drinks  . Drug use: No  . Sexual activity: Yes    Partners: Male  Other Topics Concern  . Not on file  Social History Narrative  . Not on file   Social Determinants of Health   Financial Resource Strain:   . Difficulty of Paying Living Expenses:   Food Insecurity:   . Worried About Programme researcher, broadcasting/film/videounning Out of Food in the Last Year:   . Baristaan Out of Food in the Last Year:   Transportation Needs:   . Freight forwarderLack of Transportation (Medical):   Marland Kitchen. Lack of Transportation (Non-Medical):   Physical Activity:   . Days of Exercise per Week:   . Minutes of Exercise per Session:   Stress:   . Feeling of Stress :   Social Connections:   . Frequency of Communication with Friends and Family:   . Frequency of Social Gatherings with Friends and Family:   . Attends Religious Services:   . Active Member  of Clubs or Organizations:   . Attends BankerClub or Organization Meetings:   Marland Kitchen. Marital Status:   Intimate Partner Violence:   . Fear of Current or Ex-Partner:   . Emotionally Abused:   Marland Kitchen. Physically Abused:   . Sexually Abused:    Social History   Tobacco Use  Smoking Status Former Smoker  . Packs/day: 0.25  . Years: 10.00  . Pack years: 2.50  . Types: Cigarettes  . Quit date: 07/02/1995  . Years since quitting: 24.6  Smokeless Tobacco Never Used   Social History   Substance and Sexual Activity  Alcohol Use No  . Alcohol/week: 0.0 standard drinks    Family History:  Family History  Problem Relation Age of Onset  . Stroke Mother   . Heart failure Mother   . Heart disease Mother   . Hypertension Mother   . Thyroid disease Mother   . Depression Mother   . Anxiety disorder Mother   . Obesity Mother   . Heart attack Father [id number]  . Hypertension Father   . Hyperlipidemia Father   . Alzheimer's disease Father   . Heart disease Father   . Obesity Father   . CAD Cousin 53       passed  . Cancer Paternal Uncle        prostate  . Diabetes Maternal Grandmother   . Diabetes Paternal Grandmother     Past medical history, surgical history, medications, allergies, family history and social history reviewed with patient today and changes made to appropriate areas of the chart.   Review of Systems  Constitutional: Positive for diaphoresis. Negative for chills, fever, malaise/fatigue and weight loss.  HENT: Negative.   Eyes: Negative.   Respiratory: Positive for shortness of breath. Negative for cough, hemoptysis, sputum production and wheezing.   Cardiovascular: Positive for palpitations. Negative for chest pain, orthopnea, claudication, leg swelling and PND.  Gastrointestinal: Positive for heartburn. Negative for abdominal pain, blood in stool, constipation, diarrhea, melena, nausea and vomiting.  Genitourinary: Negative.   Musculoskeletal: Positive for myalgias. Negative for back  pain, falls, joint pain and neck pain.  Skin: Negative.   Neurological: Positive for tingling. Negative for dizziness, tremors, sensory change, speech change, focal weakness, seizures, loss of consciousness,  weakness and headaches.  Endo/Heme/Allergies: Negative.   Psychiatric/Behavioral: Negative.     All other ROS negative except what is listed above and in the HPI.      Objective:    BP (!) 149/89 (BP Location: Left Arm, Patient Position: Sitting, Cuff Size: Normal)   Pulse 92   Temp 98.7 F (37.1 C) (Oral)   Ht 5' 2.99" (1.6 m)   Wt 227 lb 12.8 oz (103.3 kg)   SpO2 98%   BMI 40.36 kg/m   Wt Readings from Last 3 Encounters:  02/28/20 227 lb 12.8 oz (103.3 kg)  06/07/19 216 lb (98 kg)  05/04/19 209 lb (94.8 kg)    Physical Exam Vitals and nursing note reviewed. Exam conducted with a chaperone present.  Constitutional:      General: She is not in acute distress.    Appearance: Normal appearance. She is not ill-appearing, toxic-appearing or diaphoretic.  HENT:     Head: Normocephalic and atraumatic.     Right Ear: Tympanic membrane, ear canal and external ear normal. There is no impacted cerumen.     Left Ear: Tympanic membrane, ear canal and external ear normal. There is no impacted cerumen.     Nose: Nose normal. No congestion or rhinorrhea.     Mouth/Throat:     Mouth: Mucous membranes are moist.     Pharynx: Oropharynx is clear. No oropharyngeal exudate or posterior oropharyngeal erythema.  Eyes:     General: No scleral icterus.       Right eye: No discharge.        Left eye: No discharge.     Extraocular Movements: Extraocular movements intact.     Conjunctiva/sclera: Conjunctivae normal.     Pupils: Pupils are equal, round, and reactive to light.  Neck:     Vascular: No carotid bruit.  Cardiovascular:     Rate and Rhythm: Normal rate and regular rhythm.     Pulses: Normal pulses.     Heart sounds: No murmur. No friction rub. No gallop.   Pulmonary:      Effort: Pulmonary effort is normal. No respiratory distress.     Breath sounds: Normal breath sounds. No stridor. No wheezing, rhonchi or rales.  Chest:     Chest wall: No tenderness.     Breasts:        Right: Normal. No swelling, bleeding, inverted nipple, mass, nipple discharge, skin change or tenderness.        Left: Normal. No swelling, bleeding, inverted nipple, mass, nipple discharge, skin change or tenderness.  Abdominal:     General: Abdomen is flat. Bowel sounds are normal. There is no distension.     Palpations: Abdomen is soft. There is no mass.     Tenderness: There is no abdominal tenderness. There is no right CVA tenderness, left CVA tenderness, guarding or rebound.     Hernia: No hernia is present.  Genitourinary:    Comments: Pelvic exams deferred with shared decision making Musculoskeletal:        General: No swelling, tenderness, deformity or signs of injury.     Cervical back: Normal range of motion and neck supple. No rigidity. No muscular tenderness.     Right lower leg: No edema.     Left lower leg: No edema.  Lymphadenopathy:     Cervical: No cervical adenopathy.     Upper Body:     Right upper body: No supraclavicular, axillary or pectoral adenopathy.     Left upper  body: No supraclavicular, axillary or pectoral adenopathy.  Skin:    General: Skin is warm and dry.     Capillary Refill: Capillary refill takes less than 2 seconds.     Coloration: Skin is not jaundiced or pale.     Findings: No bruising, erythema, lesion or rash.  Neurological:     General: No focal deficit present.     Mental Status: She is alert and oriented to person, place, and time. Mental status is at baseline.     Cranial Nerves: No cranial nerve deficit.     Sensory: No sensory deficit.     Motor: No weakness.     Coordination: Coordination normal.     Gait: Gait normal.     Deep Tendon Reflexes: Reflexes normal.  Psychiatric:        Mood and Affect: Mood normal.        Behavior:  Behavior normal.        Thought Content: Thought content normal.        Judgment: Judgment normal.     Results for orders placed or performed in visit on 02/28/20  Microscopic Examination   BLD  Result Value Ref Range   WBC, UA 11-30 (A) 0 - 5 /hpf   RBC None seen 0 - 2 /hpf   Epithelial Cells (non renal) 0-10 0 - 10 /hpf   Casts Present None seen /lpf   Cast Type Hyaline casts N/A   Mucus, UA Present Not Estab.   Bacteria, UA None seen None seen/Few  Bayer DCA Hb A1c Waived  Result Value Ref Range   HB A1C (BAYER DCA - WAIVED) 5.6 <7.0 %  CBC with Differential/Platelet  Result Value Ref Range   WBC 7.1 3.4 - 10.8 x10E3/uL   RBC 4.59 3.77 - 5.28 x10E6/uL   Hemoglobin 14.3 11.1 - 15.9 g/dL   Hematocrit 41.5 34.0 - 46.6 %   MCV 90 79 - 97 fL   MCH 31.2 26.6 - 33.0 pg   MCHC 34.5 31.5 - 35.7 g/dL   RDW 12.3 11.7 - 15.4 %   Platelets 277 150 - 450 x10E3/uL   Neutrophils 48 Not Estab. %   Lymphs 38 Not Estab. %   Monocytes 10 Not Estab. %   Eos 3 Not Estab. %   Basos 1 Not Estab. %   Neutrophils Absolute 3.4 1.4 - 7.0 x10E3/uL   Lymphocytes Absolute 2.7 0.7 - 3.1 x10E3/uL   Monocytes Absolute 0.7 0.1 - 0.9 x10E3/uL   EOS (ABSOLUTE) 0.2 0.0 - 0.4 x10E3/uL   Basophils Absolute 0.1 0.0 - 0.2 x10E3/uL   Immature Granulocytes 0 Not Estab. %   Immature Grans (Abs) 0.0 0.0 - 0.1 x10E3/uL  Comprehensive metabolic panel  Result Value Ref Range   Glucose 87 65 - 99 mg/dL   BUN 17 8 - 27 mg/dL   Creatinine, Ser 0.80 0.57 - 1.00 mg/dL   GFR calc non Af Amer 80 >59 mL/min/1.73   GFR calc Af Amer 93 >59 mL/min/1.73   BUN/Creatinine Ratio 21 12 - 28   Sodium 136 134 - 144 mmol/L   Potassium 4.1 3.5 - 5.2 mmol/L   Chloride 97 96 - 106 mmol/L   CO2 26 20 - 29 mmol/L   Calcium 10.0 8.7 - 10.3 mg/dL   Total Protein 7.7 6.0 - 8.5 g/dL   Albumin 4.7 3.8 - 4.9 g/dL   Globulin, Total 3.0 1.5 - 4.5 g/dL   Albumin/Globulin Ratio 1.6 1.2 - 2.2  Bilirubin Total 0.4 0.0 - 1.2 mg/dL    Alkaline Phosphatase 86 39 - 117 IU/L   AST 24 0 - 40 IU/L   ALT 23 0 - 32 IU/L  Lipid Panel w/o Chol/HDL Ratio  Result Value Ref Range   Cholesterol, Total 231 (H) 100 - 199 mg/dL   Triglycerides 662 (H) 0 - 149 mg/dL   HDL 44 >94 mg/dL   VLDL Cholesterol Cal 39 5 - 40 mg/dL   LDL Chol Calc (NIH) 765 (H) 0 - 99 mg/dL  Microalbumin, Urine Waived  Result Value Ref Range   Microalb, Ur Waived 80 (H) 0 - 19 mg/L   Creatinine, Urine Waived 100 10 - 300 mg/dL   Microalb/Creat Ratio 30-300 (H) <30 mg/g  TSH  Result Value Ref Range   TSH 2.530 0.450 - 4.500 uIU/mL  VITAMIN D 25 Hydroxy (Vit-D Deficiency, Fractures)  Result Value Ref Range   Vit D, 25-Hydroxy 23.1 (L) 30.0 - 100.0 ng/mL  Urinalysis, Routine w reflex microscopic  Result Value Ref Range   Specific Gravity, UA 1.020 1.005 - 1.030   pH, UA 6.5 5.0 - 7.5   Color, UA Yellow Yellow   Appearance Ur Clear Clear   Leukocytes,UA 2+ (A) Negative   Protein,UA Negative Negative/Trace   Glucose, UA Negative Negative   Ketones, UA Negative Negative   RBC, UA Negative Negative   Bilirubin, UA Negative Negative   Urobilinogen, Ur 0.2 0.2 - 1.0 mg/dL   Nitrite, UA Negative Negative   Microscopic Examination See below:       Assessment & Plan:   Problem List Items Addressed This Visit      Cardiovascular and Mediastinum   Essential hypertension    Running a little high. Better on recheck. Continue diet and exercise. Call with any concerns.       Relevant Orders   CBC with Differential/Platelet (Completed)   Comprehensive metabolic panel (Completed)   Microalbumin, Urine Waived (Completed)     Endocrine   Hypothyroidism    Rechecking levels today. Await results. Call with any concerns.       Relevant Orders   CBC with Differential/Platelet (Completed)   Comprehensive metabolic panel (Completed)   TSH (Completed)     Other   Depression    Under good control on current regimen. Continue current regimen. Continue to  monitor. Call with any concerns. Refills given.       Relevant Orders   CBC with Differential/Platelet (Completed)   Comprehensive metabolic panel (Completed)   Acute anxiety    Under good control on current regimen. Continue current regimen. Continue to monitor. Call with any concerns. Refills given.      Relevant Orders   CBC with Differential/Platelet (Completed)   Comprehensive metabolic panel (Completed)   Morbid obesity (HCC)    Continue to work on diet and exercise. Call with any concerns. Continue to monitor. Goal of losing 1-2 lbs per week.       Vitamin D deficiency    Rechecking labs today. Await results. Call with any concerns.       Relevant Orders   CBC with Differential/Platelet (Completed)   Comprehensive metabolic panel (Completed)   VITAMIN D 25 Hydroxy (Vit-D Deficiency, Fractures) (Completed)   Prediabetes    Rechecking labs today. Await results. Call with any concerns.       Relevant Orders   Bayer DCA Hb A1c Waived (Completed)   CBC with Differential/Platelet (Completed)   Comprehensive metabolic panel (Completed)  Lipid Panel w/o Chol/HDL Ratio (Completed)   Urinalysis, Routine w reflex microscopic (Completed)    Other Visit Diagnoses    Routine general medical examination at a health care facility    -  Primary   Vaccines up to date/declined. Screening labs checked today. Pap up to date. Mammogram and cologuard ordered today. Continue diet and exercise. Call with concern   Screening for colon cancer       Cologuard ordered today.   Relevant Orders   Cologuard   Encounter for screening mammogram for malignant neoplasm of breast       Mammogram ordered today.   Relevant Orders   MM 3D SCREEN BREAST BILATERAL   Palpitations       Would like to see cardiology. Referral generated today. Await their input. EKG normal. Checking labs today. Await results.    Relevant Orders   EKG 12-Lead (Completed)   Ambulatory referral to Cardiology   Pyuria        Await urine culture. Treat as needed.    Relevant Orders   Urine Culture       Follow up plan: Return in about 4 weeks (around 03/27/2020).   LABORATORY TESTING:  - Pap smear: up to date  IMMUNIZATIONS:   - Tdap: Tetanus vaccination status reviewed: Tdap vaccination indicated and given today. - Influenza: Postponed to flu season - Pneumovax: Not applicable - Prevnar: Not applicable  SCREENING: -Mammogram: Ordered today  - Colonoscopy: Cologuard ordered today   PATIENT COUNSELING:   Advised to take 1 mg of folate supplement per day if capable of pregnancy.   Sexuality: Discussed sexually transmitted diseases, partner selection, use of condoms, avoidance of unintended pregnancy  and contraceptive alternatives.   Advised to avoid cigarette smoking.  I discussed with the patient that most people either abstain from alcohol or drink within safe limits (<=14/week and <=4 drinks/occasion for males, <=7/weeks and <= 3 drinks/occasion for females) and that the risk for alcohol disorders and other health effects rises proportionally with the number of drinks per week and how often a drinker exceeds daily limits.  Discussed cessation/primary prevention of drug use and availability of treatment for abuse.   Diet: Encouraged to adjust caloric intake to maintain  or achieve ideal body weight, to reduce intake of dietary saturated fat and total fat, to limit sodium intake by avoiding high sodium foods and not adding table salt, and to maintain adequate dietary potassium and calcium preferably from fresh fruits, vegetables, and low-fat dairy products.    stressed the importance of regular exercise  Injury prevention: Discussed safety belts, safety helmets, smoke detector, smoking near bedding or upholstery.   Dental health: Discussed importance of regular tooth brushing, flossing, and dental visits.    NEXT PREVENTATIVE PHYSICAL DUE IN 1 YEAR. Return in about 4 weeks (around  03/27/2020).

## 2020-02-28 NOTE — Assessment & Plan Note (Signed)
Rechecking levels today. Await results. Call with any concerns.  

## 2020-02-28 NOTE — Assessment & Plan Note (Signed)
Running a little high. Better on recheck. Continue diet and exercise. Call with any concerns.

## 2020-02-29 LAB — CBC WITH DIFFERENTIAL/PLATELET
Basophils Absolute: 0.1 10*3/uL (ref 0.0–0.2)
Basos: 1 %
EOS (ABSOLUTE): 0.2 10*3/uL (ref 0.0–0.4)
Eos: 3 %
Hematocrit: 41.5 % (ref 34.0–46.6)
Hemoglobin: 14.3 g/dL (ref 11.1–15.9)
Immature Grans (Abs): 0 10*3/uL (ref 0.0–0.1)
Immature Granulocytes: 0 %
Lymphocytes Absolute: 2.7 10*3/uL (ref 0.7–3.1)
Lymphs: 38 %
MCH: 31.2 pg (ref 26.6–33.0)
MCHC: 34.5 g/dL (ref 31.5–35.7)
MCV: 90 fL (ref 79–97)
Monocytes Absolute: 0.7 10*3/uL (ref 0.1–0.9)
Monocytes: 10 %
Neutrophils Absolute: 3.4 10*3/uL (ref 1.4–7.0)
Neutrophils: 48 %
Platelets: 277 10*3/uL (ref 150–450)
RBC: 4.59 x10E6/uL (ref 3.77–5.28)
RDW: 12.3 % (ref 11.7–15.4)
WBC: 7.1 10*3/uL (ref 3.4–10.8)

## 2020-02-29 LAB — COMPREHENSIVE METABOLIC PANEL
ALT: 23 IU/L (ref 0–32)
AST: 24 IU/L (ref 0–40)
Albumin/Globulin Ratio: 1.6 (ref 1.2–2.2)
Albumin: 4.7 g/dL (ref 3.8–4.9)
Alkaline Phosphatase: 86 IU/L (ref 39–117)
BUN/Creatinine Ratio: 21 (ref 12–28)
BUN: 17 mg/dL (ref 8–27)
Bilirubin Total: 0.4 mg/dL (ref 0.0–1.2)
CO2: 26 mmol/L (ref 20–29)
Calcium: 10 mg/dL (ref 8.7–10.3)
Chloride: 97 mmol/L (ref 96–106)
Creatinine, Ser: 0.8 mg/dL (ref 0.57–1.00)
GFR calc Af Amer: 93 mL/min/{1.73_m2} (ref >59)
GFR calc non Af Amer: 80 mL/min/{1.73_m2} (ref >59)
Globulin, Total: 3 g/dL (ref 1.5–4.5)
Glucose: 87 mg/dL (ref 65–99)
Potassium: 4.1 mmol/L (ref 3.5–5.2)
Sodium: 136 mmol/L (ref 134–144)
Total Protein: 7.7 g/dL (ref 6.0–8.5)

## 2020-02-29 LAB — LIPID PANEL W/O CHOL/HDL RATIO
Cholesterol, Total: 231 mg/dL — ABNORMAL HIGH (ref 100–199)
HDL: 44 mg/dL (ref >39)
LDL Chol Calc (NIH): 148 mg/dL — ABNORMAL HIGH (ref 0–99)
Triglycerides: 214 mg/dL — ABNORMAL HIGH (ref 0–149)
VLDL Cholesterol Cal: 39 mg/dL (ref 5–40)

## 2020-02-29 LAB — VITAMIN D 25 HYDROXY (VIT D DEFICIENCY, FRACTURES): Vit D, 25-Hydroxy: 23.1 ng/mL — ABNORMAL LOW (ref 30.0–100.0)

## 2020-02-29 LAB — TSH: TSH: 2.53 u[IU]/mL (ref 0.450–4.500)

## 2020-03-02 ENCOUNTER — Encounter: Payer: Self-pay | Admitting: Family Medicine

## 2020-03-02 ENCOUNTER — Other Ambulatory Visit: Payer: Self-pay | Admitting: Family Medicine

## 2020-03-02 DIAGNOSIS — E559 Vitamin D deficiency, unspecified: Secondary | ICD-10-CM

## 2020-03-02 MED ORDER — VITAMIN D (ERGOCALCIFEROL) 1.25 MG (50000 UNIT) PO CAPS: 50000.0000 [IU] | ORAL_CAPSULE | ORAL | 0 refills | Status: DC

## 2020-03-04 ENCOUNTER — Encounter: Payer: Self-pay | Admitting: Family Medicine

## 2020-03-04 NOTE — Assessment & Plan Note (Signed)
Rechecking labs today. Await results. Call with any concerns.  

## 2020-03-04 NOTE — Assessment & Plan Note (Signed)
Under good control on current regimen. Continue current regimen. Continue to monitor. Call with any concerns. Refills given.   

## 2020-03-04 NOTE — Patient Instructions (Addendum)
Health Maintenance, Female Adopting a healthy lifestyle and getting preventive care are important in promoting health and wellness. Ask your health care provider about:  The right schedule for you to have regular tests and exams.  Things you can do on your own to prevent diseases and keep yourself healthy. What should I know about diet, weight, and exercise? Eat a healthy diet   Eat a diet that includes plenty of vegetables, fruits, low-fat dairy products, and lean protein.  Do not eat a lot of foods that are high in solid fats, added sugars, or sodium. Maintain a healthy weight Body mass index (BMI) is used to identify weight problems. It estimates body fat based on height and weight. Your health care provider can help determine your BMI and help you achieve or maintain a healthy weight. Get regular exercise Get regular exercise. This is one of the most important things you can do for your health. Most adults should:  Exercise for at least 150 minutes each week. The exercise should increase your heart rate and make you sweat (moderate-intensity exercise).  Do strengthening exercises at least twice a week. This is in addition to the moderate-intensity exercise.  Spend less time sitting. Even light physical activity can be beneficial. Watch cholesterol and blood lipids Have your blood tested for lipids and cholesterol at 61 years of age, then have this test every 5 years. Have your cholesterol levels checked more often if:  Your lipid or cholesterol levels are high.  You are older than 61 years of age.  You are at high risk for heart disease. What should I know about cancer screening? Depending on your health history and family history, you may need to have cancer screening at various ages. This may include screening for:  Breast cancer.  Cervical cancer.  Colorectal cancer.  Skin cancer.  Lung cancer. What should I know about heart disease, diabetes, and high blood  pressure? Blood pressure and heart disease  High blood pressure causes heart disease and increases the risk of stroke. This is more likely to develop in people who have high blood pressure readings, are of African descent, or are overweight.  Have your blood pressure checked: ? Every 3-5 years if you are 18-39 years of age. ? Every year if you are 40 years old or older. Diabetes Have regular diabetes screenings. This checks your fasting blood sugar level. Have the screening done:  Once every three years after age 40 if you are at a normal weight and have a low risk for diabetes.  More often and at a younger age if you are overweight or have a high risk for diabetes. What should I know about preventing infection? Hepatitis B If you have a higher risk for hepatitis B, you should be screened for this virus. Talk with your health care provider to find out if you are at risk for hepatitis B infection. Hepatitis C Testing is recommended for:  Everyone born from 1945 through 1965.  Anyone with known risk factors for hepatitis C. Sexually transmitted infections (STIs)  Get screened for STIs, including gonorrhea and chlamydia, if: ? You are sexually active and are younger than 61 years of age. ? You are older than 61 years of age and your health care provider tells you that you are at risk for this type of infection. ? Your sexual activity has changed since you were last screened, and you are at increased risk for chlamydia or gonorrhea. Ask your health care provider if   you are at risk.  Ask your health care provider about whether you are at high risk for HIV. Your health care provider may recommend a prescription medicine to help prevent HIV infection. If you choose to take medicine to prevent HIV, you should first get tested for HIV. You should then be tested every 3 months for as long as you are taking the medicine. Pregnancy  If you are about to stop having your period (premenopausal) and  you may become pregnant, seek counseling before you get pregnant.  Take 400 to 800 micrograms (mcg) of folic acid every day if you become pregnant.  Ask for birth control (contraception) if you want to prevent pregnancy. Osteoporosis and menopause Osteoporosis is a disease in which the bones lose minerals and strength with aging. This can result in bone fractures. If you are 65 years old or older, or if you are at risk for osteoporosis and fractures, ask your health care provider if you should:  Be screened for bone loss.  Take a calcium or vitamin D supplement to lower your risk of fractures.  Be given hormone replacement therapy (HRT) to treat symptoms of menopause. Follow these instructions at home: Lifestyle  Do not use any products that contain nicotine or tobacco, such as cigarettes, e-cigarettes, and chewing tobacco. If you need help quitting, ask your health care provider.  Do not use street drugs.  Do not share needles.  Ask your health care provider for help if you need support or information about quitting drugs. Alcohol use  Do not drink alcohol if: ? Your health care provider tells you not to drink. ? You are pregnant, may be pregnant, or are planning to become pregnant.  If you drink alcohol: ? Limit how much you use to 0-1 drink a day. ? Limit intake if you are breastfeeding.  Be aware of how much alcohol is in your drink. In the [place] one drink equals one 12 oz bottle of beer (355 mL), one 5 oz glass of wine (148 mL), or one 1 oz glass of hard liquor (44 mL). General instructions  Schedule regular health, dental, and eye exams.  Stay current with your vaccines.  Tell your health care provider if: ? You often feel depressed. ? You have ever been abused or do not feel safe at home. Summary  Adopting a healthy lifestyle and getting preventive care are important in promoting health and wellness.  Follow your health care provider's instructions about healthy  diet, exercising, and getting tested or screened for diseases.  Follow your health care provider's instructions on monitoring your cholesterol and blood pressure. This information is not intended to replace advice given to you by your health care provider. Make sure you discuss any questions you have with your health care provider. Document Revised: 09/29/2018 Document Reviewed: 09/29/2018 Elsevier Patient Education  2020 Elsevier Inc.  

## 2020-03-04 NOTE — Assessment & Plan Note (Signed)
Continue to work on diet and exercise. Call with any concerns. Continue to monitor. Goal of losing 1-2 lbs per week.

## 2020-05-14 ENCOUNTER — Other Ambulatory Visit: Payer: Self-pay | Admitting: Family Medicine

## 2020-06-26 ENCOUNTER — Other Ambulatory Visit: Payer: Self-pay | Admitting: Family Medicine

## 2020-06-26 DIAGNOSIS — E038 Other specified hypothyroidism: Secondary | ICD-10-CM

## 2020-06-26 MED ORDER — LEVOTHYROXINE SODIUM 137 MCG PO TABS: 137.0000 ug | ORAL_TABLET | Freq: Every day | ORAL | 2 refills | Status: DC

## 2020-06-26 NOTE — Telephone Encounter (Signed)
Requested Prescriptions  Pending Prescriptions Disp Refills   levothyroxine (SYNTHROID) 137 MCG tablet 90 tablet 2    Sig: Take 1 tablet (137 mcg total) by mouth daily before breakfast.     Endocrinology:  Hypothyroid Agents Failed - 06/26/2020  2:47 PM      Failed - TSH needs to be rechecked within 3 months after an abnormal result. Refill until TSH is due.      Passed - TSH in normal range and within 360 days    TSH  Date Value Ref Range Status  02/28/2020 2.530 0.450 - 4.500 uIU/mL Final         Passed - Valid encounter within last 12 months    Recent Outpatient Visits          3 months ago Routine general medical examination at a health care facility   [facility], [place] P, DO   1 year ago Essential hypertension   [facility] [place], Megan P, DO   1 year ago Moderate episode of recurrent major depressive disorder [facility])   [facility] [place], Megan P, DO   2 years ago Moderate episode of recurrent major depressive disorder [facility])   [facility] [place], [place], DO   2 years ago Folliculitis   [facility] [place], [place], DO      Future Appointments            In 2 months Laural Benes, Oralia Rud, DO [facility]

## 2020-07-01 ENCOUNTER — Other Ambulatory Visit: Payer: Self-pay | Admitting: Family Medicine

## 2020-07-01 DIAGNOSIS — E559 Vitamin D deficiency, unspecified: Secondary | ICD-10-CM

## 2020-07-01 NOTE — Telephone Encounter (Signed)
Requested medication (s) are due for refill today: no  Requested medication (s) are on the active medication list:  yes   Last refill:  06/24/2020  Future visit scheduled: yes   Notes to clinic: 50,000 IU strengths are not delegated   Requested Prescriptions  Pending Prescriptions Disp Refills   Vitamin D, Ergocalciferol, (DRISDOL) 1.25 MG (50000 UNIT) CAPS capsule [Pharmacy Med Name: VITAMIN D2 50,000IU (ERGO) CAP RX] 12 capsule 0    Sig: TAKE 1 CAPSULE BY MOUTH EVERY 7 DAYS      Endocrinology:  Vitamins - Vitamin D Supplementation Failed - 07/01/2020  8:09 AM      Failed - 50,000 IU strengths are not delegated      Failed - Phosphate in normal range and within 360 days    No results found for: PHOS        Failed - Vitamin D in normal range and within 360 days    Vit D, 25-Hydroxy  Date Value Ref Range Status  02/28/2020 23.1 (L) 30.0 - 100.0 ng/mL Final    Comment:    Vitamin D deficiency has been defined by the Institute of Medicine and an Endocrine Society practice guideline as a level of serum 25-OH vitamin D less than 20 ng/mL (1,2). The Endocrine Society went on to further define vitamin D insufficiency as a level between 21 and 29 ng/mL (2). 1. IOM (Institute of Medicine). 2010. Dietary reference    intakes for calcium and D. [place]: The    [employer]. 2. Holick MF, Binkley [place], Bischoff-Ferrari HA, et al.    Evaluation, treatment, and prevention of vitamin D    deficiency: an Endocrine Society clinical practice    guideline. JCEM. 2011 Jul; 96(7):1911-30.           Passed - Ca in normal range and within 360 days    Calcium  Date Value Ref Range Status  02/28/2020 10.0 8.7 - 10.3 mg/dL Final   Calcium, Total  Date Value Ref Range Status  10/24/2014 8.8 8.5 - 10.1 mg/dL Final          Passed - Valid encounter within last 12 months    Recent Outpatient Visits           4 months ago Routine general medical examination at a health care  facility   [facility], [place] P, DO   1 year ago Essential hypertension   [facility] [place], Megan P, DO   1 year ago Moderate episode of recurrent major depressive disorder [facility])   [facility] [place], Megan P, DO   2 years ago Moderate episode of recurrent major depressive disorder [facility])   [facility] [place], [place], DO   2 years ago Folliculitis   [facility] [place], [place], DO       Future Appointments             In 2 months Laural Benes, Oralia Rud, DO [facility]

## 2020-08-09 ENCOUNTER — Encounter: Payer: Self-pay | Admitting: Nurse Practitioner

## 2020-08-09 ENCOUNTER — Telehealth (INDEPENDENT_AMBULATORY_CARE_PROVIDER_SITE_OTHER): Payer: BC Managed Care – PPO | Admitting: Nurse Practitioner

## 2020-08-09 VITALS — Wt 220.0 lb

## 2020-08-09 DIAGNOSIS — J3489 Other specified disorders of nose and nasal sinuses: Secondary | ICD-10-CM | POA: Insufficient documentation

## 2020-08-09 MED ORDER — PREDNISONE 20 MG PO TABS
40.0000 mg | ORAL_TABLET | Freq: Every day | ORAL | 0 refills | Status: AC
Start: 2020-08-09 — End: 2020-08-14

## 2020-08-09 MED ORDER — FLUTICASONE PROPIONATE 50 MCG/ACT NA SUSP
2.0000 | Freq: Every day | NASAL | 6 refills | Status: DC
Start: 2020-08-09 — End: 2021-01-21

## 2020-08-09 NOTE — Assessment & Plan Note (Signed)
Acute x 5 days of symptoms.  Discussed with patient viral symptoms, these usually run their course in 5-7 days. Unfortunately, antibiotics don't work against viruses and just increase your risk of other issues such as diarrhea, yeast infections, and resistant infections.  Scripts sent for Prednisone and  Flonase, recommend she continue daily Zyrtec.  Highly recommended she obtain Covid testing ASAP and report to provider results, she is to self quarantine until results return and symptoms improve. Recommend: - Increased rest - Increasing Fluids - Acetaminophen for fever if present - Saline sinus flushes or a neti pot.  - Humidifying the air.  If feeling worse with facial pain, high fever, cough, shortness of breath or start feeling significantly worse, please call us right away to be further evaluated.

## 2020-08-09 NOTE — Progress Notes (Signed)
Wt 220 lb (99.8 kg)   BMI 38.98 kg/m    Subjective:    Patient ID: Emily [name], female    DOB: 1961/10/02, 59 y.o.   MRN: [id number]  HPI: Emily [name] is a 59 y.o. female  Chief Complaint  Patient presents with  . Sinusitis    patient c/o head ache, nasal congeston and "burning" x 1 week. patient reports taking Zyrtec daily for 5 days reports no symptom control. patint denies any fever, cough, ear pain or sore throat.    . This visit was completed via MyChart due to the restrictions of the COVID-19 pandemic. All issues as above were discussed and addressed. Physical exam was done as above through visual confirmation on MyChart. If it was felt that the patient should be evaluated in the office, they were directed there. The patient verbally consented to this visit. . Location of the patient: home . Location of the provider: work . Those involved with this call:  . Provider: Aura Dials, DNP . CMA: Wilhemena Durie, CMA . Front Desk/Registration: Harriet Pho  . Time spent on call: 20 minutes with patient face to face via video conference. More than 50% of this time was spent in counseling and coordination of care. 15 minutes total spent in review of patient's record and preparation of their chart.  . I verified patient identity using two factors (patient name and date of birth). Patient consents verbally to being seen via telemedicine visit today.   UPPER RESPIRATORY TRACT INFECTION Reports symptoms started about 5 days ago, started with bad headache and nasal congestion with burning.  Denies loss of taste or smell.  Has not been Covid vaccinated. Fever: no Cough: no Shortness of breath: no Wheezing: no Chest pain: no Chest tightness: no Chest congestion: no Nasal congestion: yes Runny nose: yes Post nasal drip: yes Sneezing: no Sore throat: no Swollen glands: no Sinus pressure: yes Headache: yes Face pain: yes Toothache: yes Ear pain: none Ear pressure:  none Eyes red/itching:no Eye drainage/crusting: no  Vomiting: no Rash: no Fatigue: yes Sick contacts: no Strep contacts: no  Context: worse Recurrent sinusitis: no Relief with OTC cold/cough medications: a little  Treatments attempted: anti-histamine   Relevant past medical, surgical, family and social history reviewed and updated as indicated. Interim medical history since our last visit reviewed. Allergies and medications reviewed and updated.  Review of Systems  Constitutional: Positive for fatigue. Negative for activity change, appetite change and fever.  HENT: Positive for congestion, postnasal drip, rhinorrhea, sinus pressure and sinus pain. Negative for ear discharge, ear pain, facial swelling, sneezing, sore throat and voice change.   Eyes: Negative for pain and visual disturbance.  Respiratory: Negative for cough, chest tightness, shortness of breath and wheezing.   Cardiovascular: Negative for chest pain, palpitations and leg swelling.  Gastrointestinal: Negative for abdominal distention, abdominal pain, constipation, diarrhea, nausea and vomiting.  Endocrine: Negative.   Musculoskeletal: Negative for myalgias.  Neurological: Negative for dizziness, numbness and headaches.  Psychiatric/Behavioral: Negative.     Per HPI unless specifically indicated above     Objective:    Wt 220 lb (99.8 kg)   BMI 38.98 kg/m   Wt Readings from Last 3 Encounters:  08/09/20 220 lb (99.8 kg)  02/28/20 227 lb 12.8 oz (103.3 kg)  06/07/19 216 lb (98 kg)    Physical Exam Vitals and nursing note reviewed.  Constitutional:      General: She is awake. She is not in acute distress.  Appearance: She is well-developed and well-groomed. She is ill-appearing.  HENT:     Head: Normocephalic.     Right Ear: Hearing normal.     Left Ear: Hearing normal.  Eyes:     General: Lids are normal.        Right eye: No discharge.        Left eye: No discharge.     Conjunctiva/sclera:  Conjunctivae normal.  Pulmonary:     Effort: Pulmonary effort is normal. No accessory muscle usage or respiratory distress.  Musculoskeletal:     Cervical back: Normal range of motion.  Neurological:     Mental Status: She is alert and oriented to person, place, and time.  Psychiatric:        Attention and Perception: Attention normal.        Mood and Affect: Mood normal.        Behavior: Behavior normal. Behavior is cooperative.        Thought Content: Thought content normal.        Judgment: Judgment normal.     Results for orders placed or performed in visit on 02/28/20  Microscopic Examination   BLD  Result Value Ref Range   WBC, UA 11-30 (A) 0 - 5 /hpf   RBC None seen 0 - 2 /hpf   Epithelial Cells (non renal) 0-10 0 - 10 /hpf   Casts Present None seen /lpf   Cast Type Hyaline casts N/A   Mucus, UA Present Not Estab.   Bacteria, UA None seen None seen/Few  Bayer DCA Hb A1c Waived  Result Value Ref Range   HB A1C (BAYER DCA - WAIVED) 5.6 <7.0 %  CBC with Differential/Platelet  Result Value Ref Range   WBC 7.1 3.4 - 10.8 x10E3/uL   RBC 4.59 3.77 - 5.28 x10E6/uL   Hemoglobin 14.3 11.1 - 15.9 g/dL   Hematocrit 23.7 62.8 - 46.6 %   MCV 90 79 - 97 fL   MCH 31.2 26.6 - 33.0 pg   MCHC 34.5 31 - 35 g/dL   RDW 31.5 17.6 - 16.0 %   Platelets 277 150 - 450 x10E3/uL   Neutrophils 48 Not Estab. %   Lymphs 38 Not Estab. %   Monocytes 10 Not Estab. %   Eos 3 Not Estab. %   Basos 1 Not Estab. %   Neutrophils Absolute 3.4 1.40 - 7.00 x10E3/uL   Lymphocytes Absolute 2.7 0 - 3 x10E3/uL   Monocytes Absolute 0.7 0 - 0 x10E3/uL   EOS (ABSOLUTE) 0.2 0.0 - 0.4 x10E3/uL   Basophils Absolute 0.1 0 - 0 x10E3/uL   Immature Granulocytes 0 Not Estab. %   Immature Grans (Abs) 0.0 0.0 - 0.1 x10E3/uL  Comprehensive metabolic panel  Result Value Ref Range   Glucose 87 65 - 99 mg/dL   BUN 17 8 - 27 mg/dL   Creatinine, Ser 7.37 0.57 - 1.00 mg/dL   GFR calc non Af Amer 80 >59 mL/min/1.73    GFR calc Af Amer 93 >59 mL/min/1.73   BUN/Creatinine Ratio 21 12 - 28   Sodium 136 134 - 144 mmol/L   Potassium 4.1 3.5 - 5.2 mmol/L   Chloride 97 96 - 106 mmol/L   CO2 26 20 - 29 mmol/L   Calcium 10.0 8.7 - 10.3 mg/dL   Total Protein 7.7 6.0 - 8.5 g/dL   Albumin 4.7 3.8 - 4.9 g/dL   Globulin, Total 3.0 1.5 - 4.5 g/dL   Albumin/Globulin Ratio 1.6  1.2 - 2.2   Bilirubin Total 0.4 0.0 - 1.2 mg/dL   Alkaline Phosphatase 86 39 - 117 IU/L   AST 24 0 - 40 IU/L   ALT 23 0 - 32 IU/L  Lipid Panel w/o Chol/HDL Ratio  Result Value Ref Range   Cholesterol, Total 231 (H) 100 - 199 mg/dL   Triglycerides 342 (H) 0 - 149 mg/dL   HDL 44 >87 mg/dL   VLDL Cholesterol Cal 39 5 - 40 mg/dL   LDL Chol Calc (NIH) 681 (H) 0 - 99 mg/dL  Microalbumin, Urine Waived  Result Value Ref Range   Microalb, Ur Waived 80 (H) 0 - 19 mg/L   Creatinine, Urine Waived 100 10 - 300 mg/dL   Microalb/Creat Ratio 30-300 (H) <30 mg/g  TSH  Result Value Ref Range   TSH 2.530 0.450 - 4.500 uIU/mL  VITAMIN D 25 Hydroxy (Vit-D Deficiency, Fractures)  Result Value Ref Range   Vit D, 25-Hydroxy 23.1 (L) 30.0 - 100.0 ng/mL  Urinalysis, Routine w reflex microscopic  Result Value Ref Range   Specific Gravity, UA 1.020 1.005 - 1.030   pH, UA 6.5 5.0 - 7.5   Color, UA Yellow Yellow   Appearance Ur Clear Clear   Leukocytes,UA 2+ (A) Negative   Protein,UA Negative Negative/Trace   Glucose, UA Negative Negative   Ketones, UA Negative Negative   RBC, UA Negative Negative   Bilirubin, UA Negative Negative   Urobilinogen, Ur 0.2 0.2 - 1.0 mg/dL   Nitrite, UA Negative Negative   Microscopic Examination See below:       Assessment & Plan:   Problem List Items Addressed This Visit      Other   Sinus pressure - Primary    Acute x 5 days of symptoms.  Discussed with patient viral symptoms, these usually run their course in 5-7 days. Unfortunately, antibiotics don't work against viruses and just increase your risk of other issues  such as diarrhea, yeast infections, and resistant infections.  Scripts sent for Prednisone and  Flonase, recommend she continue daily Zyrtec.  Highly recommended she obtain Covid testing ASAP and report to provider results, she is to self quarantine until results return and symptoms improve. Recommend: - Increased rest - Increasing Fluids - Acetaminophen for fever if present - Saline sinus flushes or a neti pot.  - Humidifying the air.  If feeling worse with facial pain, high fever, cough, shortness of breath or start feeling significantly worse, please call us right away to be further evaluated.         I discussed the assessment and treatment plan with the patient. The patient was provided an opportunity to ask questions and all were answered. The patient agreed with the plan and demonstrated an understanding of the instructions.   The patient was advised to call back or seek an in-person evaluation if the symptoms worsen or if the condition fails to improve as anticipated.   I provided 21+ minutes of time during this encounter.  Follow up plan: No follow-ups on file.

## 2020-08-09 NOTE — Patient Instructions (Signed)
Sinus Headache  A sinus headache occurs when your sinuses become clogged or swollen. Sinuses are air-filled spaces in your skull that are behind the bones of your face and forehead. Sinus headaches can range from mild to severe. What are the causes? A sinus headache can result from various conditions that affect the sinuses. Common causes include:  Colds.  Sinus infections.  Allergies. Many people confuse sinus headaches with migraines or tension headaches because those headaches can also cause facial pain and nasal symptoms. What are the signs or symptoms? The main symptom of this condition is a headache that may feel like pain or pressure in your face, forehead, ears, or upper teeth. People who have a sinus headache often have other symptoms, such as:  Congested or runny nose.  Fever.  Inability to smell. Weather changes can make symptoms worse. How is this diagnosed? This condition may be diagnosed based on:  A physical exam and medical history.  Imaging tests, such as a CT scan or MRI, to check for problems with the sinuses.  Examination of the sinuses using a thin tool with a camera that is inserted through your nose (endoscopy). How is this treated? Treatment for this condition depends on the cause.  Sinus pain that is caused by a sinus infection may be treated with antibiotic medicine.  Sinus pain that is caused by allergies may be helped by allergy medicines (antihistamines) and medicated nasal sprays.  Sinus pain that is caused by congestion may be helped by rinsing out (flushing) the nose and sinuses with saline solution.  Sinus surgery may be needed in some cases if other treatments do not help. Follow these instructions at home: General instructions  If directed: ? Apply a warm, moist washcloth to your face to help relieve pain. ? Use a nasal saline wash. Medicines   Take over-the-counter and prescription medicines only as told by your health care  provider.  If you were prescribed an antibiotic medicine, take it as told by your health care provider. Do not stop taking the antibiotic even if you start to feel better.  If you have congestion, use a nasal spray to help lessen pressure. Hydrate and humidify  Drink enough water to keep your urine clear or pale yellow. Staying hydrated will help to thin your mucus.  Use a cool mist humidifier to keep the humidity level in your home above 50%.  Inhale steam for 10-15 minutes, 3-4 times a day or as told by your health care provider. You can do this in the bathroom while a hot shower is running.  Limit your exposure to cool or dry air. Contact a health care provider if:  You have a headache more than one time a week.  You have sensitivity to light or sound.  You develop a fever.  You feel nauseous or you vomit.  Your headaches do not get better with treatment. Many people think that they have a sinus headache when they actually have a migraine or a tension headache. Get help right away if:  You have vision problems.  You have sudden, severe pain in your face or head.  You have a seizure.  You are confused.  You have a stiff neck. Summary  A sinus headache occurs when your sinuses become clogged or swollen.  A sinus headache can result from various conditions that affect the sinuses, such as a cold, a sinus infection, or an allergy.  Treatment for this condition depends on the cause. It may include   medicine, such as antibiotics or antihistamines. This information is not intended to replace advice given to you by your health care provider. Make sure you discuss any questions you have with your health care provider. Document Revised: 09/18/2017 Document Reviewed: 07/17/2017 Elsevier Patient Education  2020 [employer].  

## 2020-08-13 ENCOUNTER — Other Ambulatory Visit: Payer: Self-pay | Admitting: Family Medicine

## 2020-08-30 ENCOUNTER — Ambulatory Visit: Payer: BC Managed Care – PPO | Admitting: Family Medicine

## 2020-11-12 ENCOUNTER — Other Ambulatory Visit: Payer: Self-pay | Admitting: Family Medicine

## 2020-11-28 ENCOUNTER — Other Ambulatory Visit: Payer: Self-pay | Admitting: Family Medicine

## 2020-11-28 DIAGNOSIS — I10 Essential (primary) hypertension: Secondary | ICD-10-CM

## 2020-11-28 NOTE — Telephone Encounter (Signed)
Requested Prescriptions  Pending Prescriptions Disp Refills  . venlafaxine XR (EFFEXOR-XR) 75 MG 24 hr capsule [Pharmacy Med Name: VENLAFAXINE HCL ER CAPS 75MG ] 30 capsule 0    Sig: TAKE 1 CAPSULE DAILY WITH BREAKFAST     Psychiatry: Antidepressants - SNRI - desvenlafaxine & venlafaxine Failed - 11/28/2020 12:10 AM      Failed - LDL in normal range and within 360 days    LDL Chol Calc (NIH)  Date Value Ref Range Status  02/28/2020 148 (H) 0 - 99 mg/dL Final         Failed - Total Cholesterol in normal range and within 360 days    Cholesterol, Total  Date Value Ref Range Status  02/28/2020 231 (H) 100 - 199 mg/dL Final         Failed - Triglycerides in normal range and within 360 days    Triglycerides  Date Value Ref Range Status  02/28/2020 214 (H) 0 - 149 mg/dL Final         Failed - Last BP in normal range    BP Readings from Last 1 Encounters:  02/28/20 (!) 149/89         Passed - Completed PHQ-2 or PHQ-9 in the last 360 days      Passed - Valid encounter within last 6 months    Recent Outpatient Visits          3 months ago Sinus pressure   [facility] [place], [place] T, NP   9 months ago Routine general medical examination at a health care facility   [facility], [place], DO   1 year ago Essential hypertension   [facility] [place], Megan P, DO   2 years ago Moderate episode of recurrent major depressive disorder (HCC)   [facility] [place], Megan P, DO   2 years ago Moderate episode of recurrent major depressive disorder (HCC)   [facility] [place], Megan P, DO             . hydrochlorothiazide (HYDRODIURIL) 25 MG tablet [Pharmacy Med Name: HYDROCHLOROTHIAZIDE TABS 25MG ] 30 tablet 0    Sig: TAKE 1 TABLET DAILY     Cardiovascular: Diuretics - Thiazide Failed - 11/28/2020 12:10 AM      Failed - Last BP in normal range    BP Readings from Last 1 Encounters:  02/28/20 (!) 149/89          Passed - Ca in normal range and within 360 days    Calcium  Date Value Ref Range Status  02/28/2020 10.0 8.7 - 10.3 mg/dL Final   Calcium, Total  Date Value Ref Range Status  10/24/2014 8.8 8.5 - 10.1 mg/dL Final         Passed - Cr in normal range and within 360 days    Creatinine  Date Value Ref Range Status  10/24/2014 0.76 0.60 - 1.30 mg/dL Final   Creatinine, Ser  Date Value Ref Range Status  02/28/2020 0.80 0.57 - 1.00 mg/dL Final         Passed - K in normal range and within 360 days    Potassium  Date Value Ref Range Status  02/28/2020 4.1 3.5 - 5.2 mmol/L Final  10/24/2014 3.9 3.5 - 5.1 mmol/L Final         Passed - Na in normal range and within 360 days    Sodium  Date Value Ref Range Status  02/28/2020 136 134 - 144  mmol/L Final  10/24/2014 139 136 - 145 mmol/L Final         Passed - Valid encounter within last 6 months    Recent Outpatient Visits          3 months ago Sinus pressure   [facility] [place], [place] T, NP   9 months ago Routine general medical examination at a health care facility   [facility], [place], DO   1 year ago Essential hypertension   [facility] [place], Megan P, DO   2 years ago Moderate episode of recurrent major depressive disorder (HCC)   [facility] [place], Megan P, DO   2 years ago Moderate episode of recurrent major depressive disorder (HCC)   [facility] [place], Megan P, DO             . venlafaxine XR (EFFEXOR-XR) 150 MG 24 hr capsule [Pharmacy Med Name: VENLAFAXINE HCL ER CAPS 150MG ] 30 capsule 0    Sig: TAKE 1 CAPSULE DAILY WITH BREAKFAST     Psychiatry: Antidepressants - SNRI - desvenlafaxine & venlafaxine Failed - 11/28/2020 12:10 AM      Failed - LDL in normal range and within 360 days    LDL Chol Calc (NIH)  Date Value Ref Range Status  02/28/2020 148 (H) 0 - 99 mg/dL Final         Failed - Total Cholesterol in normal range and  within 360 days    Cholesterol, Total  Date Value Ref Range Status  02/28/2020 231 (H) 100 - 199 mg/dL Final         Failed - Triglycerides in normal range and within 360 days    Triglycerides  Date Value Ref Range Status  02/28/2020 214 (H) 0 - 149 mg/dL Final         Failed - Last BP in normal range    BP Readings from Last 1 Encounters:  02/28/20 (!) 149/89         Passed - Completed PHQ-2 or PHQ-9 in the last 360 days      Passed - Valid encounter within last 6 months    Recent Outpatient Visits          3 months ago Sinus pressure   [facility] [place], [place] T, NP   9 months ago Routine general medical examination at a health care facility   [facility], [place], DO   1 year ago Essential hypertension   [facility] [place], Megan P, DO   2 years ago Moderate episode of recurrent major depressive disorder [facility])   [facility] [place], Megan P, DO   2 years ago Moderate episode of recurrent major depressive disorder (HCC)   [facility] [place], [place], DO

## 2021-01-02 ENCOUNTER — Telehealth: Payer: Self-pay

## 2021-01-02 NOTE — Telephone Encounter (Signed)
Called pt to schedule an office visit no answer left vm, per 2/9 refill pt needs appt  Copied from CRM [id number]. Topic: General - Other >> Jan 02, 2021  3:47 PM Gwenlyn Fudge wrote: Reason for CRM: Pt called and is requesting to have PCP place orders for her to have lab work done so that she can receive her medications. Please advise.

## 2021-01-21 ENCOUNTER — Ambulatory Visit
Admission: RE | Admit: 2021-01-21 | Discharge: 2021-01-21 | Disposition: A | Discharge status: Home / Self Care | Payer: BC Managed Care – PPO | Source: Home / Self Care | Attending: Family Medicine | Admitting: Family Medicine

## 2021-01-21 ENCOUNTER — Encounter: Payer: Self-pay | Admitting: Family Medicine

## 2021-01-21 ENCOUNTER — Other Ambulatory Visit: Payer: Self-pay

## 2021-01-21 ENCOUNTER — Ambulatory Visit
Admission: RE | Admit: 2021-01-21 | Discharge: 2021-01-21 | Disposition: A | Discharge status: Home / Self Care | Payer: BC Managed Care – PPO | Source: Ambulatory Visit | Attending: Family Medicine | Admitting: Family Medicine

## 2021-01-21 ENCOUNTER — Ambulatory Visit: Payer: BC Managed Care – PPO | Admitting: Family Medicine

## 2021-01-21 VITALS — BP 128/84 | HR 76 | Temp 98.2°F | Ht 64.0 in | Wt 217.4 lb

## 2021-01-21 DIAGNOSIS — Z1231 Encounter for screening mammogram for malignant neoplasm of breast: Secondary | ICD-10-CM

## 2021-01-21 DIAGNOSIS — M25511 Pain in right shoulder: Secondary | ICD-10-CM | POA: Insufficient documentation

## 2021-01-21 DIAGNOSIS — R7303 Prediabetes: Secondary | ICD-10-CM

## 2021-01-21 DIAGNOSIS — M25552 Pain in left hip: Secondary | ICD-10-CM

## 2021-01-21 DIAGNOSIS — Z13220 Encounter for screening for lipoid disorders: Secondary | ICD-10-CM

## 2021-01-21 DIAGNOSIS — M545 Low back pain, unspecified: Secondary | ICD-10-CM | POA: Diagnosis not present

## 2021-01-21 DIAGNOSIS — Z1211 Encounter for screening for malignant neoplasm of colon: Secondary | ICD-10-CM

## 2021-01-21 DIAGNOSIS — I10 Essential (primary) hypertension: Secondary | ICD-10-CM

## 2021-01-21 DIAGNOSIS — G8929 Other chronic pain: Secondary | ICD-10-CM | POA: Diagnosis not present

## 2021-01-21 DIAGNOSIS — R200 Anesthesia of skin: Secondary | ICD-10-CM | POA: Diagnosis not present

## 2021-01-21 DIAGNOSIS — F339 Major depressive disorder, recurrent, unspecified: Secondary | ICD-10-CM

## 2021-01-21 DIAGNOSIS — E559 Vitamin D deficiency, unspecified: Secondary | ICD-10-CM

## 2021-01-21 DIAGNOSIS — F3289 Other specified depressive episodes: Secondary | ICD-10-CM

## 2021-01-21 DIAGNOSIS — M542 Cervicalgia: Secondary | ICD-10-CM | POA: Diagnosis not present

## 2021-01-21 DIAGNOSIS — K921 Melena: Secondary | ICD-10-CM

## 2021-01-21 DIAGNOSIS — E038 Other specified hypothyroidism: Secondary | ICD-10-CM

## 2021-01-21 MED ORDER — LOSARTAN POTASSIUM 100 MG PO TABS: 100.0000 mg | ORAL_TABLET | Freq: Every day | ORAL | 1 refills | Status: DC

## 2021-01-21 MED ORDER — MELOXICAM 7.5 MG PO TABS: 7.5000 mg | ORAL_TABLET | Freq: Every day | ORAL | 1 refills | Status: DC

## 2021-01-21 MED ORDER — VENLAFAXINE HCL ER 75 MG PO CP24: 75.0000 mg | ORAL_CAPSULE | Freq: Every day | ORAL | 1 refills | Status: DC

## 2021-01-21 MED ORDER — HYDROCHLOROTHIAZIDE 25 MG PO TABS: 1.0000 | ORAL_TABLET | Freq: Every day | ORAL | 1 refills | Status: DC

## 2021-01-21 MED ORDER — VENLAFAXINE HCL ER 150 MG PO CP24: 150.0000 mg | ORAL_CAPSULE | Freq: Every day | ORAL | 1 refills | Status: DC

## 2021-01-21 MED ORDER — FLUTICASONE PROPIONATE 50 MCG/ACT NA SUSP: 2.0000 | Freq: Every day | NASAL | 6 refills | Status: DC

## 2021-01-21 NOTE — Patient Instructions (Addendum)
Call to schedule your mammogram:  [facility]  Address: [address], [place], [place] [address]  Phone: [phone]    Essentials of Physical Medicine and Rehabilitation (4th ed., pp. [phone]). [place], [place]: Elsevier.">  Thoracic Outlet Syndrome Rehab Ask your health care provider which exercises are safe for you. Do exercises exactly as told by your health care provider and adjust them as directed. It is normal to feel mild stretching, pulling, tightness, or discomfort as you do these exercises. Stop right away if you feel sudden pain or your pain gets worse. Do not begin these exercises until told by your health care provider. Stretching and range-of-motion exercises These exercises warm up your muscles and joints and improve the movement and flexibility of your shoulder. These exercises also help to relieve pain, numbness, and tingling. Gentle chest stretch with belly breathing 1. On the floor, place a half foam roller or a bath towel that is rolled up lengthwise. 2. Lie on your back so your spine--all the way from your head to your tailbone--is on top of the foam roller or towel. Relax. You should feel a gentle stretch across your upper chest. ? Keep both feet or heels firmly on the floor. ? You may bend your knees for comfort. ? Let your arms fall naturally to your sides. 3. Breathe deeply and slowly from your belly. You should feel your belly rise each time you breathe in (inhale). Breathe out (exhale) completely before you inhale again. 4. Stay in this position and continue to inhale and exhale for __________ seconds. ? Over time, gradually increase the amount of time that you hold this stretch, as told by your health care provider. Repeat __________ times. Complete this exercise __________ times a day. Scalene stretches There are 3 types of scalene stretches. They vary depending on which direction you are looking while you do the exercise.  Your health care provider will tell you which type of scalene stretch to do. 1. Lie on your back. Place the hand from your left / right side under the hip of your left / right side. For example, if your right shoulder is injured, place your right hand under your right hip. 2. Gently and slowly tilt your head toward your healthy shoulder. Follow instructions from your health care provider about which direction you should turn your face while you tilt your head: ? Posterior scalene stretch: Turn your face toward your healthy shoulder, in the same direction that you tilt your head. ? Middle scalene stretch: Turn your face toward the ceiling while you tilt your head toward your healthy shoulder. ? Anterior scalene stretch: Turn your face toward your injured shoulder. This means that you tilt your head one direction (toward your healthy shoulder), but you turn your face in the opposite direction. 3. Hold each stretch for __________ seconds. Repeat __________ times. Complete this exercise __________ times a day. Chin tuck This exercise is sometimes called axial extension. 1. Using good posture, sit on a stable surface or stand up. If you have trouble keeping good posture, rest your back and head against a stable wall during this exercise. 2. Look straight ahead and slowly move your chin back, toward your neck, until you feel a stretch in the back of your head. ? Your head should slide back. ? Your chin should be slightly lowered. 3. Hold for __________ seconds. 4. Return to the starting position. Repeat __________ times. Complete this exercise __________ times a day.  Shoulder rolls 1. Sit in a sturdy chair or stand up. Keep good posture during the exercise. 2. Shrug your shoulders up toward your ears and gently move your shoulders around in circles going forward. 3. Reverse the direction of your shoulder rolls so you are moving your shoulders around in circles going backward. Repeat __________  times. Complete this exercise __________ times a day.   Chest stretch This exercise is sometimes called external rotation and abduction. 1. Stand in a doorway with one of your feet slightly in front of the other. This is called a staggered stance. If you cannot reach your forearms to the door frame, do this exercise in a corner of a room. 2. Move your hands away from the center of your body (abduction) by choosing one of the following positions (external rotation): ? Place your hands and forearms on the door frame above your head. ? Place your hands and forearms on the door frame at the height of your head. ? Place your hands on the door frame at the height of your elbows. 3. Slowly move your weight onto your front foot until you feel a stretch across your chest and in the front of your shoulders. Keep your head and chest upright and keep your abdominal muscles tight. 4. Hold for __________ seconds. 5. To release the stretch, shift your weight to your back foot. Repeat __________ times. Complete this exercise __________ times a day.   Strengthening exercise This exercise builds strength and endurance in your shoulder. Endurance is the ability to use your muscles for a long time, even after they get tired. Scapular retraction 1. Sit in a stable chair without armrests, or stand up. 2. Secure an exercise band to a stable object in front of you so the band is at shoulder height. 3. Hold one end of the band in each hand. Your palms should face down. 4. Straighten your elbows and lift your arms up to shoulder height. 5. Step back, away from the secured end of the band, until the band has no slack. 6. Squeeze your shoulder blades (scapulae) together and pull your elbows back behind you (retraction). ? Your elbows should be at about chest or shoulder height. ? Keep your upper arms lifted, away from your sides. 7. Hold for __________ seconds. 8. Slowly return to the starting position. Repeat  __________ times. Complete this exercise __________ times a day.   Posture and body mechanics Good posture and healthy body mechanics can help to relieve stress in your body's tissues and joints. Body mechanics refers to the movements and positions of your body while you do your daily activities. Posture is part of body mechanics. Good posture means:  Your spine is in its natural S-curve position (neutral).  Your shoulders are pulled back slightly.  Your head is not tipped forward. Follow these guidelines to improve your posture and body mechanics in your everyday activities. Standing  When standing, keep your spine neutral and your feet about hip width apart. Keep a slight bend in your knees. Your ears, shoulders, and hips should line up with each other.  When you do a task in which you stand in one place for a long time, place one foot up on a stable object that is 2-4 inches (5-10 cm) high, such as a footstool. This helps keep your spine neutral.   Sitting  When sitting, keep your spine neutral and keep your feet flat on the floor. Use a footrest, if necessary, and keep your  thighs parallel to the floor. Avoid rounding your shoulders, and avoid tilting your head forward.  When working at a desk or a computer, keep your desk at a height where your hands are slightly lower than your elbows. Slide your chair under your desk so you are close enough to maintain good posture.  When working at a computer, place your monitor at a height where you are looking straight ahead and you do not have to tilt your head forward or downward to look at the screen.   Resting  When lying down and resting, avoid positions that are most painful for you.  If you have pain with activities such as sitting, bending, stooping, or squatting (flexion-based activities), lie in a position in which your body does not bend very much. For example, avoid curling up on your side with your arms and knees near your chest  (fetal position).  If you have pain with activities such as standing for a long time or reaching with your arms (extension-based activities), lie with your spine in a neutral position and bend your knees slightly. Try the following positions: ? Lying on your side with a pillow between your knees. ? Lying on your back with a pillow under your knees.   This information is not intended to replace advice given to you by your health care provider. Make sure you discuss any questions you have with your health care provider. Document Revised: 07/11/2019 Document Reviewed: 11/16/2018 Elsevier Patient Education  2021 Elsevier Inc.  Sciatica Rehab Ask your health care provider which exercises are safe for you. Do exercises exactly as told by your health care provider and adjust them as directed. It is normal to feel mild stretching, pulling, tightness, or discomfort as you do these exercises. Stop right away if you feel sudden pain or your pain gets worse. Do not begin these exercises until told by your health care provider. Stretching and range-of-motion exercises These exercises warm up your muscles and joints and improve the movement and flexibility of your hips and back. These exercises also help to relieve pain, numbness, and tingling. Sciatic nerve glide 1. Sit in a chair with your head facing down toward your chest. Place your hands behind your back. Let your shoulders slump forward. 2. Slowly straighten one of your legs while you tilt your head back as if you are looking toward the ceiling. Only straighten your leg as far as you can without making your symptoms worse. 3. Hold this position for __________ seconds. 4. Slowly return to the starting position. 5. Repeat with your other leg. Repeat __________ times. Complete this exercise __________ times a day. Knee to chest with hip adduction and internal rotation 1. Lie on your back on a firm surface with both legs straight. 2. Bend one of your  knees and move it up toward your chest until you feel a gentle stretch in your lower back and buttock. Then, move your knee toward the shoulder that is on the opposite side from your leg. This is hip adduction and internal rotation. ? Hold your leg in this position by holding on to the front of your knee. 3. Hold this position for __________ seconds. 4. Slowly return to the starting position. 5. Repeat with your other leg. Repeat __________ times. Complete this exercise __________ times a day.   Prone extension on elbows 1. Lie on your abdomen on a firm surface. A bed may be too soft for this exercise. 2. Prop yourself up on your  elbows. 3. Use your arms to help lift your chest up until you feel a gentle stretch in your abdomen and your lower back. ? This will place some of your body weight on your elbows. If this is uncomfortable, try stacking pillows under your chest. ? Your hips should stay down, against the surface that you are lying on. Keep your hip and back muscles relaxed. 4. Hold this position for __________ seconds. 5. Slowly relax your upper body and return to the starting position. Repeat __________ times. Complete this exercise __________ times a day.   Strengthening exercises These exercises build strength and endurance in your back. Endurance is the ability to use your muscles for a long time, even after they get tired. Pelvic tilt This exercise strengthens the muscles that lie deep in the abdomen. 1. Lie on your back on a firm surface. Bend your knees and keep your feet flat on the floor. 2. Tense your abdominal muscles. Tip your pelvis up toward the ceiling and flatten your lower back into the floor. ? To help with this exercise, you may place a small towel under your lower back and try to push your back into the towel. 3. Hold this position for __________ seconds. 4. Let your muscles relax completely before you repeat this exercise. Repeat __________ times. Complete this  exercise __________ times a day. Alternating arm and leg raises 1. Get on your hands and knees on a firm surface. If you are on a hard floor, you may want to use padding, such as an exercise mat, to cushion your knees. 2. Line up your arms and legs. Your hands should be directly below your shoulders, and your knees should be directly below your hips. 3. Lift your left leg behind you. At the same time, raise your right arm and straighten it in front of you. ? Do not lift your leg higher than your hip. ? Do not lift your arm higher than your shoulder. ? Keep your abdominal and back muscles tight. ? Keep your hips facing the ground. ? Do not arch your back. ? Keep your balance carefully, and do not hold your breath. 4. Hold this position for __________ seconds. 5. Slowly return to the starting position. 6. Repeat with your right leg and your left arm. Repeat __________ times. Complete this exercise __________ times a day.   Posture and body mechanics Good posture and healthy body mechanics can help to relieve stress in your body's tissues and joints. Body mechanics refers to the movements and positions of your body while you do your daily activities. Posture is part of body mechanics. Good posture means:  Your spine is in its natural S-curve position (neutral).  Your shoulders are pulled back slightly.  Your head is not tipped forward. Follow these guidelines to improve your posture and body mechanics in your everyday activities. Standing  When standing, keep your spine neutral and your feet about hip width apart. Keep a slight bend in your knees. Your ears, shoulders, and hips should line up.  When you do a task in which you stand in one place for a long time, place one foot up on a stable object that is 2-4 inches (5-10 cm) high, such as a footstool. This helps keep your spine neutral.   Sitting  When sitting, keep your spine neutral and keep your feet flat on the floor. Use a footrest,  if necessary, and keep your thighs parallel to the floor. Avoid rounding your shoulders, and avoid  tilting your head forward.  When working at a desk or a computer, keep your desk at a height where your hands are slightly lower than your elbows. Slide your chair under your desk so you are close enough to maintain good posture.  When working at a computer, place your monitor at a height where you are looking straight ahead and you do not have to tilt your head forward or downward to look at the screen.   Resting  When lying down and resting, avoid positions that are most painful for you.  If you have pain with activities such as sitting, bending, stooping, or squatting, lie in a position in which your body does not bend very much. For example, avoid curling up on your side with your arms and knees near your chest (fetal position).  If you have pain with activities such as standing for a long time or reaching with your arms, lie with your spine in a neutral position and bend your knees slightly. Try the following positions: ? Lying on your side with a pillow between your knees. ? Lying on your back with a pillow under your knees. Lifting  When lifting objects, keep your feet at least shoulder width apart and tighten your abdominal muscles.  Bend your knees and hips and keep your spine neutral. It is important to lift using the strength of your legs, not your back. Do not lock your knees straight out.  Always ask for help to lift heavy or awkward objects.   This information is not intended to replace advice given to you by your health care provider. Make sure you discuss any questions you have with your health care provider. Document Revised: 01/28/2019 Document Reviewed: 10/28/2018 Elsevier Patient Education  2021 [employer].

## 2021-01-21 NOTE — Assessment & Plan Note (Signed)
Under good control on current regimen. Continue current regimen. Continue to monitor. Call with any concerns. Refills given. Labs drawn today.   

## 2021-01-21 NOTE — Progress Notes (Signed)
BP 128/84   Pulse 76   Temp 98.2 F (36.8 C) (Oral)   Ht 5\' 4"  (1.626 m)   Wt 217 lb 6.4 oz (98.6 kg)   SpO2 97%   BMI 37.32 kg/m    Subjective:    Patient ID: Emily [name], female    DOB: 05/04/1961, 60 y.o.   MRN: [id number]  HPI: Emily [name] is a 60 y.o. female  Chief Complaint  Patient presents with  . foot numbness    Right foot numbness for months,Patient states that it feels as if she has a string tied around her toes.  . Neck/Shoulder pain    Right side neck pain for months. Patient states that she cant sleep at night  . Hip Pain    Left hip pain for past 2 weeks, was leaning and putting lotion on legs and felt something pop  . Hypertension  . Hypothyroidism  . Depression   NUMBNESS Duration: 2-3 months Onset: gradual Location: R foot, in her toes Bilateral: no Symmetric: no Decreased sensation: yes  Weakness: no Pain: no Quality:  Numb and tingling Severity: severe  Frequency: chronic Trauma: no Recent illness: no Diabetes: no Thyroid disease: yes  HIV: no  Alcoholism: no  Spinal cord injury: no Status: stable  HYPERTENSION Hypertension status: controlled  Satisfied with current treatment? yes Duration of hypertension: chronic BP monitoring frequency:  not checking BP medication side effects:  no Medication compliance: excellent compliance Previous BP meds: losartan, HCTZ Aspirin: no Recurrent headaches: no Visual changes: no Palpitations: no Dyspnea: yes Chest pain: no Lower extremity edema: no Dizzy/lightheaded: no  HYPOTHYROIDISM Thyroid control status:controlled Satisfied with current treatment? yes Medication side effects: no Medication compliance: excellent compliance Recent dose adjustment:no Fatigue: yes Cold intolerance: no Heat intolerance: no Weight gain: yes Weight loss: no Constipation: yes Diarrhea/loose stools: no Palpitations: no Lower extremity edema: no Anxiety/depressed mood: yes  DEPRESSION Mood  status: stable Satisfied with current treatment?: yes Symptom severity: mild  Duration of current treatment : chronic Side effects: no Medication compliance: excellent compliance Psychotherapy/counseling: no  Previous psychiatric medications: effexor Depressed mood: no Anxious mood: no Anhedonia: no Significant weight loss or gain: no Insomnia: no  Fatigue: yes Feelings of worthlessness or guilt: no Impaired concentration/indecisiveness: no Suicidal ideations: no Hopelessness: no Crying spells: no Depression screen [facility] 2/9 01/21/2021 02/28/2020 06/07/2019 11/17/2018 08/26/2018  Decreased Interest 0 0 1 2 2   Down, Depressed, Hopeless 0 0 0 3 2  PHQ - 2 Score 0 0 1 5 4   Altered sleeping - 1 1 1 2   Tired, decreased energy - 0 3 3 3   Change in appetite - 0 0 2 1  Feeling bad or failure about yourself  - 0 0 1 1  Trouble concentrating - 1 1 2 2   Moving slowly or fidgety/restless - 0 0 0 0  Suicidal thoughts - 0 0 0 0  PHQ-9 Score - 2 6 14 13   Difficult doing work/chores - - Somewhat difficult Not difficult at all Very difficult    Relevant past medical, surgical, family and social history reviewed and updated as indicated. Interim medical history since our last visit reviewed. Allergies and medications reviewed and updated.  Review of Systems  Constitutional: Positive for fatigue. Negative for activity change, appetite change, chills, diaphoresis, fever and unexpected weight change.  HENT: Negative.   Respiratory: Positive for shortness of breath. Negative for apnea, cough, choking, chest tightness, wheezing and stridor.   Cardiovascular: Negative.  Gastrointestinal: Negative.   Musculoskeletal: Positive for arthralgias, back pain, myalgias, neck pain and neck stiffness. Negative for gait problem and joint swelling.  Skin: Negative.   Neurological: Negative.   Psychiatric/Behavioral: Negative.     Per HPI unless specifically indicated above     Objective:    BP 128/84    Pulse 76   Temp 98.2 F (36.8 C) (Oral)   Ht 5\' 4"  (1.626 m)   Wt 217 lb 6.4 oz (98.6 kg)   SpO2 97%   BMI 37.32 kg/m   Wt Readings from Last 3 Encounters:  01/21/21 217 lb 6.4 oz (98.6 kg)  08/09/20 220 lb (99.8 kg)  02/28/20 227 lb 12.8 oz (103.3 kg)    Physical Exam Vitals and nursing note reviewed.  Constitutional:      General: She is not in acute distress.    Appearance: Normal appearance. She is not ill-appearing, toxic-appearing or diaphoretic.  HENT:     Head: Normocephalic and atraumatic.     Right Ear: External ear normal.     Left Ear: External ear normal.     Nose: Nose normal.     Mouth/Throat:     Mouth: Mucous membranes are moist.     Pharynx: Oropharynx is clear.  Eyes:     General: No scleral icterus.       Right eye: No discharge.        Left eye: No discharge.     Extraocular Movements: Extraocular movements intact.     Conjunctiva/sclera: Conjunctivae normal.     Pupils: Pupils are equal, round, and reactive to light.  Cardiovascular:     Rate and Rhythm: Normal rate and regular rhythm.     Pulses: Normal pulses.     Heart sounds: Normal heart sounds. No murmur heard. No friction rub. No gallop.   Pulmonary:     Effort: Pulmonary effort is normal. No respiratory distress.     Breath sounds: Normal breath sounds. No stridor. No wheezing, rhonchi or rales.  Chest:     Chest wall: No tenderness.  Musculoskeletal:        General: Normal range of motion.     Cervical back: Normal range of motion and neck supple.  Skin:    General: Skin is warm and dry.     Capillary Refill: Capillary refill takes less than 2 seconds.     Coloration: Skin is not jaundiced or pale.     Findings: No bruising, erythema, lesion or rash.  Neurological:     General: No focal deficit present.     Mental Status: She is alert and oriented to person, place, and time. Mental status is at baseline.  Psychiatric:        Mood and Affect: Mood normal.        Behavior: Behavior  normal.        Thought Content: Thought content normal.        Judgment: Judgment normal.     Results for orders placed or performed in visit on 02/28/20  Microscopic Examination   BLD  Result Value Ref Range   WBC, UA 11-30 (A) 0 - 5 /hpf   RBC None seen 0 - 2 /hpf   Epithelial Cells (non renal) 0-10 0 - 10 /hpf   Casts Present None seen /lpf   Cast Type Hyaline casts N/A   Mucus, UA Present Not Estab.   Bacteria, UA None seen None seen/Few  Bayer DCA Hb A1c Waived  Result Value  Ref Range   HB A1C (BAYER DCA - WAIVED) 5.6 <7.0 %  CBC with Differential/Platelet  Result Value Ref Range   WBC 7.1 3.4 - 10.8 x10E3/uL   RBC 4.59 3.77 - 5.28 x10E6/uL   Hemoglobin 14.3 11.1 - 15.9 g/dL   Hematocrit 16.141.5 09.634.0 - 46.6 %   MCV 90 79 - 97 fL   MCH 31.2 26.6 - 33.0 pg   MCHC 34.5 31.5 - 35.7 g/dL   RDW 04.512.3 40.911.7 - 81.115.4 %   Platelets 277 150 - 450 x10E3/uL   Neutrophils 48 Not Estab. %   Lymphs 38 Not Estab. %   Monocytes 10 Not Estab. %   Eos 3 Not Estab. %   Basos 1 Not Estab. %   Neutrophils Absolute 3.4 1.4 - 7.0 x10E3/uL   Lymphocytes Absolute 2.7 0.7 - 3.1 x10E3/uL   Monocytes Absolute 0.7 0.1 - 0.9 x10E3/uL   EOS (ABSOLUTE) 0.2 0.0 - 0.4 x10E3/uL   Basophils Absolute 0.1 0.0 - 0.2 x10E3/uL   Immature Granulocytes 0 Not Estab. %   Immature Grans (Abs) 0.0 0.0 - 0.1 x10E3/uL  Comprehensive metabolic panel  Result Value Ref Range   Glucose 87 65 - 99 mg/dL   BUN 17 8 - 27 mg/dL   Creatinine, Ser 9.140.80 0.57 - 1.00 mg/dL   GFR calc non Af Amer 80 >59 mL/min/1.73   GFR calc Af Amer 93 >59 mL/min/1.73   BUN/Creatinine Ratio 21 12 - 28   Sodium 136 134 - 144 mmol/L   Potassium 4.1 3.5 - 5.2 mmol/L   Chloride 97 96 - 106 mmol/L   CO2 26 20 - 29 mmol/L   Calcium 10.0 8.7 - 10.3 mg/dL   Total Protein 7.7 6.0 - 8.5 g/dL   Albumin 4.7 3.8 - 4.9 g/dL   Globulin, Total 3.0 1.5 - 4.5 g/dL   Albumin/Globulin Ratio 1.6 1.2 - 2.2   Bilirubin Total 0.4 0.0 - 1.2 mg/dL   Alkaline  Phosphatase 86 39 - 117 IU/L   AST 24 0 - 40 IU/L   ALT 23 0 - 32 IU/L  Lipid Panel w/o Chol/HDL Ratio  Result Value Ref Range   Cholesterol, Total 231 (H) 100 - 199 mg/dL   Triglycerides 782214 (H) 0 - 149 mg/dL   HDL 44 >95>39 mg/dL   VLDL Cholesterol Cal 39 5 - 40 mg/dL   LDL Chol Calc (NIH) 621148 (H) 0 - 99 mg/dL  Microalbumin, Urine Waived  Result Value Ref Range   Microalb, Ur Waived 80 (H) 0 - 19 mg/L   Creatinine, Urine Waived 100 10 - 300 mg/dL   Microalb/Creat Ratio 30-300 (H) <30 mg/g  TSH  Result Value Ref Range   TSH 2.530 0.450 - 4.500 uIU/mL  VITAMIN D 25 Hydroxy (Vit-D Deficiency, Fractures)  Result Value Ref Range   Vit D, 25-Hydroxy 23.1 (L) 30.0 - 100.0 ng/mL  Urinalysis, Routine w reflex microscopic  Result Value Ref Range   Specific Gravity, UA 1.020 1.005 - 1.030   pH, UA 6.5 5.0 - 7.5   Color, UA Yellow Yellow   Appearance Ur Clear Clear   Leukocytes,UA 2+ (A) Negative   Protein,UA Negative Negative/Trace   Glucose, UA Negative Negative   Ketones, UA Negative Negative   RBC, UA Negative Negative   Bilirubin, UA Negative Negative   Urobilinogen, Ur 0.2 0.2 - 1.0 mg/dL   Nitrite, UA Negative Negative   Microscopic Examination See below:       Assessment &  Plan:   Problem List Items Addressed This Visit      Cardiovascular and Mediastinum   Essential hypertension    Under good control on current regimen. Continue current regimen. Continue to monitor. Call with any concerns. Refills given. Labs drawn today.        Relevant Medications   losartan (COZAAR) 100 MG tablet   hydrochlorothiazide (HYDRODIURIL) 25 MG tablet   Other Relevant Orders   CBC with Differential/Platelet   Comprehensive metabolic panel   Urinalysis, Routine w reflex microscopic   Microalbumin, Urine Waived     Endocrine   Hypothyroidism - Primary    Rechecking labs today. Await results. Treat as needed.       Relevant Orders   CBC with Differential/Platelet   Comprehensive  metabolic panel   TSH     Other   Depression, recurrent (HCC)    Under good control on current regimen. Continue current regimen. Continue to monitor. Call with any concerns. Refills given. Labs drawn today.        Relevant Medications   venlafaxine XR (EFFEXOR-XR) 150 MG 24 hr capsule   venlafaxine XR (EFFEXOR-XR) 75 MG 24 hr capsule   Vitamin D deficiency    Rechecking labs today. Await results. Treat as needed.      Relevant Orders   CBC with Differential/Platelet   Comprehensive metabolic panel   Prediabetes    Rechecking labs today. Await results. Treat as needed.       Relevant Orders   Bayer DCA Hb A1c Waived   CBC with Differential/Platelet   Comprehensive metabolic panel   Urinalysis, Routine w reflex microscopic   Microalbumin, Urine Waived    Other Visit Diagnoses    Screening for cholesterol level       Labs drawn today. Await results.    Relevant Orders   Lipid Panel w/o Chol/HDL Ratio   Hematochezia       ?hemorrhoids. Due for colonoscopy- referral generated today.   Relevant Orders   Ambulatory referral to Gastroenterology   Left hip pain       Will send for x-ray and treat with meloxicam. Call with any concerns.    Relevant Orders   DG Lumbar Spine Complete   DG Hip Unilat W OR W/O Pelvis 2-3 Views Left   Numbness of right foot       Will send for x-ray and treat with meloxicam. Call with any concerns.    Relevant Orders   DG Lumbar Spine Complete   Chronic right shoulder pain       Will send for x-ray and treat with meloxicam. Call with any concerns.    Relevant Medications   venlafaxine XR (EFFEXOR-XR) 150 MG 24 hr capsule   venlafaxine XR (EFFEXOR-XR) 75 MG 24 hr capsule   meloxicam (MOBIC) 7.5 MG tablet   Other Relevant Orders   DG Cervical Spine Complete   DG Shoulder Right   Encounter for screening mammogram for malignant neoplasm of breast       Mammogram ordered today.   Relevant Orders   MM 3D SCREEN BREAST BILATERAL   Screening  for colon cancer       Referral to GI placed today.   Relevant Orders   Ambulatory referral to Gastroenterology       Follow up plan: Return in about 6 weeks (around 03/04/2021) for physical with pap.

## 2021-01-21 NOTE — Assessment & Plan Note (Signed)
Rechecking labs today. Await results. Treat as needed.  °

## 2021-01-22 ENCOUNTER — Encounter: Payer: Self-pay | Admitting: Family Medicine

## 2021-01-22 ENCOUNTER — Encounter: Payer: Self-pay | Admitting: *Deleted

## 2021-01-22 LAB — CBC WITH DIFFERENTIAL/PLATELET
Basophils Absolute: 0.1 10*3/uL (ref 0.0–0.2)
Basos: 1 %
EOS (ABSOLUTE): 0.2 10*3/uL (ref 0.0–0.4)
Eos: 3 %
Hematocrit: 42.7 % (ref 34.0–46.6)
Hemoglobin: 14.2 g/dL (ref 11.1–15.9)
Immature Grans (Abs): 0 10*3/uL (ref 0.0–0.1)
Immature Granulocytes: 0 %
Lymphocytes Absolute: 3 10*3/uL (ref 0.7–3.1)
Lymphs: 37 %
MCH: 30.7 pg (ref 26.6–33.0)
MCHC: 33.3 g/dL (ref 31.5–35.7)
MCV: 92 fL (ref 79–97)
Monocytes Absolute: 0.6 10*3/uL (ref 0.1–0.9)
Monocytes: 8 %
Neutrophils Absolute: 4.1 10*3/uL (ref 1.4–7.0)
Neutrophils: 51 %
Platelets: 287 10*3/uL (ref 150–450)
RBC: 4.62 x10E6/uL (ref 3.77–5.28)
RDW: 12.8 % (ref 11.7–15.4)
WBC: 8.1 10*3/uL (ref 3.4–10.8)

## 2021-01-22 LAB — COMPREHENSIVE METABOLIC PANEL
ALT: 30 IU/L (ref 0–32)
AST: 24 IU/L (ref 0–40)
Albumin/Globulin Ratio: 1.5 (ref 1.2–2.2)
Albumin: 4.6 g/dL (ref 3.8–4.8)
Alkaline Phosphatase: 79 IU/L (ref 44–121)
BUN/Creatinine Ratio: 32 — ABNORMAL HIGH (ref 12–28)
BUN: 22 mg/dL (ref 8–27)
Bilirubin Total: 0.4 mg/dL (ref 0.0–1.2)
CO2: 22 mmol/L (ref 20–29)
Calcium: 10.1 mg/dL (ref 8.7–10.3)
Chloride: 97 mmol/L (ref 96–106)
Creatinine, Ser: 0.68 mg/dL (ref 0.57–1.00)
Globulin, Total: 3.1 g/dL (ref 1.5–4.5)
Glucose: 93 mg/dL (ref 65–99)
Potassium: 4.1 mmol/L (ref 3.5–5.2)
Sodium: 138 mmol/L (ref 134–144)
Total Protein: 7.7 g/dL (ref 6.0–8.5)
eGFR: 99 mL/min/{1.73_m2} (ref >59)

## 2021-01-22 LAB — URINALYSIS, ROUTINE W REFLEX MICROSCOPIC
Bilirubin, UA: NEGATIVE
Glucose, UA: NEGATIVE
Ketones, UA: NEGATIVE
Leukocytes,UA: NEGATIVE
Nitrite, UA: NEGATIVE
Protein,UA: NEGATIVE
RBC, UA: NEGATIVE
Specific Gravity, UA: 1.02 (ref 1.005–1.030)
Urobilinogen, Ur: 0.2 mg/dL (ref 0.2–1.0)
pH, UA: 6.5 (ref 5.0–7.5)

## 2021-01-22 LAB — TSH: TSH: 3.63 u[IU]/mL (ref 0.450–4.500)

## 2021-01-22 LAB — LIPID PANEL W/O CHOL/HDL RATIO
Cholesterol, Total: 236 mg/dL — ABNORMAL HIGH (ref 100–199)
HDL: 45 mg/dL (ref >39)
LDL Chol Calc (NIH): 147 mg/dL — ABNORMAL HIGH (ref 0–99)
Triglycerides: 242 mg/dL — ABNORMAL HIGH (ref 0–149)
VLDL Cholesterol Cal: 44 mg/dL — ABNORMAL HIGH (ref 5–40)

## 2021-01-22 LAB — MICROALBUMIN, URINE WAIVED
Creatinine, Urine Waived: 200 mg/dL (ref 10–300)
Microalb, Ur Waived: 30 mg/L — ABNORMAL HIGH (ref 0–19)
Microalb/Creat Ratio: <30 mg/g (ref <30)

## 2021-01-22 LAB — BAYER DCA HB A1C WAIVED: HB A1C (BAYER DCA - WAIVED): 5.5 % (ref <7.0)

## 2021-02-04 ENCOUNTER — Other Ambulatory Visit: Payer: Self-pay

## 2021-02-04 ENCOUNTER — Ambulatory Visit: Payer: BC Managed Care – PPO | Admitting: Nurse Practitioner

## 2021-02-04 ENCOUNTER — Encounter: Payer: Self-pay | Admitting: Nurse Practitioner

## 2021-02-04 VITALS — BP 123/77 | HR 78 | Temp 98.6°F | Wt 216.8 lb

## 2021-02-04 DIAGNOSIS — R21 Rash and other nonspecific skin eruption: Secondary | ICD-10-CM | POA: Diagnosis not present

## 2021-02-04 DIAGNOSIS — L304 Erythema intertrigo: Secondary | ICD-10-CM

## 2021-02-04 MED ORDER — DOXYCYCLINE HYCLATE 100 MG PO TABS: 100.0000 mg | ORAL_TABLET | Freq: Two times a day (BID) | ORAL | 0 refills | Status: DC

## 2021-02-04 MED ORDER — PREDNISONE 10 MG PO TABS: ORAL_TABLET | ORAL | 0 refills | Status: DC

## 2021-02-04 MED ORDER — NYSTATIN 100000 UNIT/GM EX OINT: 1.0000 "application " | TOPICAL_OINTMENT | Freq: Two times a day (BID) | CUTANEOUS | 0 refills | Status: DC

## 2021-02-04 NOTE — Progress Notes (Signed)
Acute Office Visit  Subjective:    Patient ID: Emily [name], female    DOB: [birth year], 60 y.o.   MRN: [id number]  Chief Complaint  Patient presents with  . Poison Ivy    Patient states she was out doing yard work and does not know if the bite on her leg may have caused her reaction of rash all over her body and close to her eye or if it is poison ivy. Patient was bit on her lower left leg on Saturday and then notice the rash all over yesterday (Sunday). Patient states she has tried OTC Cortisone cream.   . Insect Bite    HPI Patient is in today for rash on her face, arms, chest, and legs.  RASH  She was working out in the yard and thought something might have bitten her left lower ankle. Then she started getting an itchy rash on her arms, legs, chest, and face. Hydrocortisone cream helped with the itching, however it the rash is still there. She also notes an area of redness on her left axilla that has been there for several months. She has tried a cream there however is unable to remember the name of it and it did not help.    Duration:  days  Location: trunk, face, hands, arms and legs  Itching: yes Burning: no Redness: yes Oozing: no Scaling: no Blisters: no Painful: no Fevers: no Change in detergents/soaps/personal care products: no Recent illness: no Recent travel:no History of same: no Context: fluctuating Alleviating factors: hydrocortisone cream Treatments attempted:hydrocortisone cream Shortness of breath: no  Throat/tongue swelling: no Myalgias/arthralgias: no   Past Medical History:  Diagnosis Date  . Anxiety   . Back pain   . Chest pain    a. 10/2013 Neg ETT; b. 10/2015 Refused MV->Cath: nl cors, nl EF.  . Degenerative disc disease, lumbar   . Depression   . Essential hypertension   . Fatigue   . GERD (gastroesophageal reflux disease)   . Hypothyroidism   . Joint pain in fingers of both hands   . Morbid obesity (HCC)   . Myalgia     Past  Surgical History:  Procedure Laterality Date  . CARDIAC CATHETERIZATION N/A 11/14/2015   Procedure: Left Heart Cath and Coronary Angiography;  Surgeon: Iran Ouch, MD;  Location: [facility];  Service: Cardiovascular;  Laterality: N/A;  . CARPAL TUNNEL RELEASE Left 07/03/2015   Procedure: CARPAL TUNNEL RELEASE;  Surgeon: Myra Rude, MD;  Location: [facility] ORS;  Service: Orthopedics;  Laterality: Left;  . CARPAL TUNNEL RELEASE Right 09/07/2015   Procedure: CARPAL TUNNEL RELEASE;  Surgeon: Myra Rude, MD;  Location: [facility] ORS;  Service: Orthopedics;  Laterality: Right;  . CHOLECYSTECTOMY    . KNEE SURGERY Right   . TUBAL LIGATION      Family History  Problem Relation Age of Onset  . Stroke Mother   . Heart failure Mother   . Heart disease Mother   . Hypertension Mother   . Thyroid disease Mother   . Depression Mother   . Anxiety disorder Mother   . Obesity Mother   . Heart attack Father 77  . Hypertension Father   . Hyperlipidemia Father   . Alzheimer's disease Father   . Heart disease Father   . Obesity Father   . CAD Cousin 53       passed  . Cancer Paternal Uncle        prostate  . Diabetes Maternal  Grandmother   . Diabetes Paternal Grandmother     Social History   Socioeconomic History  . Marital status: Married    Spouse name: Tinnie GensJeffrey  . Number of children: Not on file  . Years of education: Not on file  . Highest education level: Not on file  Occupational History  . Occupation: retired from [employer] 30 yrs of service  Tobacco Use  . Smoking status: Former Smoker    Packs/day: 0.25    Years: 10.00    Pack years: 2.50    Types: Cigarettes    Quit date: 07/02/1995    Years since quitting: 25.6  . Smokeless tobacco: Never Used  Vaping Use  . Vaping Use: Never used  Substance and Sexual Activity  . Alcohol use: No    Alcohol/week: 0.0 standard drinks  . Drug use: No  . Sexual activity: Yes    Partners: Male  Other Topics Concern   . Not on file  Social History Narrative  . Not on file   Social Determinants of Health   Financial Resource Strain: Not on file  Food Insecurity: Not on file  Transportation Needs: Not on file  Physical Activity: Not on file  Stress: Not on file  Social Connections: Not on file  Intimate Partner Violence: Not on file    Outpatient Medications Prior to Visit  Medication Sig Dispense Refill  . fluticasone (FLONASE) 50 MCG/ACT nasal spray Place 2 sprays into both nostrils daily. 16 g 6  . hydrochlorothiazide (HYDRODIURIL) 25 MG tablet Take 1 tablet (25 mg total) by mouth daily. 90 tablet 1  . levothyroxine (SYNTHROID) 137 MCG tablet Take 1 tablet (137 mcg total) by mouth daily before breakfast. 90 tablet 2  . losartan (COZAAR) 100 MG tablet Take 1 tablet (100 mg total) by mouth daily. 90 tablet 1  . meloxicam (MOBIC) 7.5 MG tablet Take 1 tablet (7.5 mg total) by mouth daily. 90 tablet 1  . methocarbamol (ROBAXIN) 500 MG tablet Take 500 mg by mouth as needed.    . Naproxen Sodium 220 MG CAPS Take 1 capsule by mouth as needed.    Marland Kitchen. omeprazole (PRILOSEC) 20 MG capsule Take 20 mg by mouth daily as needed.    . venlafaxine XR (EFFEXOR-XR) 150 MG 24 hr capsule Take 1 capsule (150 mg total) by mouth daily with breakfast. To be taken with her 75mg  for 225mg  total 90 capsule 1  . venlafaxine XR (EFFEXOR-XR) 75 MG 24 hr capsule Take 1 capsule (75 mg total) by mouth daily with breakfast. To be taken with her 150mg  for 225mg  total 90 capsule 1  . Vitamin D, Ergocalciferol, (DRISDOL) 1.25 MG (50000 UNIT) CAPS capsule Take 1 capsule (50,000 Units total) by mouth every 7 (seven) days. 12 capsule 0  . amoxicillin (AMOXIL) 500 MG tablet Take 500 mg by mouth 3 (three) times daily. (Patient not taking: Reported on 02/04/2021)    . cyclobenzaprine (FLEXERIL) 10 MG tablet Take 10 mg by mouth as needed. (Patient not taking: Reported on 02/04/2021)    . fluticasone (CUTIVATE) 0.05 % cream as needed.  (Patient  not taking: Reported on 02/04/2021)    . ketoconazole (NIZORAL) 2 % cream daily as needed.  (Patient not taking: Reported on 02/04/2021)    . Omega-3 Fatty Acids (FISH OIL) 500 MG CAPS Take by mouth. (Patient not taking: Reported on 02/04/2021)     No facility-administered medications prior to visit.    No Known Allergies  Review of Systems  Constitutional: Positive for fatigue. Negative for fever.  Respiratory: Negative.   Cardiovascular: Negative.   Gastrointestinal: Negative.   Genitourinary: Negative.   Musculoskeletal: Negative.   Neurological: Negative.        Objective:    Physical Exam Vitals and nursing note reviewed.  Constitutional:      General: She is not in acute distress.    Appearance: Normal appearance.  HENT:     Head: Normocephalic.  Eyes:     Conjunctiva/sclera: Conjunctivae normal.  Cardiovascular:     Rate and Rhythm: Normal rate and regular rhythm.     Pulses: Normal pulses.     Heart sounds: Normal heart sounds.  Pulmonary:     Effort: Pulmonary effort is normal.     Breath sounds: Normal breath sounds.  Musculoskeletal:     Cervical back: Normal range of motion.  Skin:    General: Skin is warm.     Findings: Rash present.     Comments: Generalized macopapular rash to bilateral arms, legs, chest, and left cheek. There is a round, scabbed area to right lateral ankle with surrounding erythema. Also redness and small open area to left axillary skin fold.  Neurological:     General: No focal deficit present.     Mental Status: She is alert and oriented to person, place, and time.  Psychiatric:        Mood and Affect: Mood normal.        Behavior: Behavior normal.        Thought Content: Thought content normal.        Judgment: Judgment normal.     BP 123/77   Pulse 78   Temp 98.6 F (37 C) (Oral)   Wt 216 lb 12.8 oz (98.3 kg)   SpO2 97%   BMI 37.21 kg/m  Wt Readings from Last 3 Encounters:  02/04/21 216 lb 12.8 oz (98.3 kg)  01/21/21  217 lb 6.4 oz (98.6 kg)  08/09/20 220 lb (99.8 kg)    Health Maintenance Due  Topic Date Due  . COVID-19 Vaccine (1) Never done  . MAMMOGRAM  12/03/2018  . COLONOSCOPY (Pts 45-39yrs Insurance coverage will need to be confirmed)  10/23/2019    There are no preventive care reminders to display for this patient.   Lab Results  Component Value Date   TSH 3.630 01/21/2021   Lab Results  Component Value Date   WBC 8.1 01/21/2021   HGB 14.2 01/21/2021   HCT 42.7 01/21/2021   MCV 92 01/21/2021   PLT 287 01/21/2021   Lab Results  Component Value Date   NA 138 01/21/2021   K 4.1 01/21/2021   CO2 22 01/21/2021   GLUCOSE 93 01/21/2021   BUN 22 01/21/2021   CREATININE 0.68 01/21/2021   BILITOT 0.4 01/21/2021   ALKPHOS 79 01/21/2021   AST 24 01/21/2021   ALT 30 01/21/2021   PROT 7.7 01/21/2021   ALBUMIN 4.6 01/21/2021   CALCIUM 10.1 01/21/2021   ANIONGAP 9 11/12/2015   Lab Results  Component Value Date   CHOL 236 (H) 01/21/2021   Lab Results  Component Value Date   HDL 45 01/21/2021   Lab Results  Component Value Date   LDLCALC 147 (H) 01/21/2021   Lab Results  Component Value Date   TRIG 242 (H) 01/21/2021   No results found for: [facility] Lab Results  Component Value Date   HGBA1C 5.5 01/21/2021       Assessment & Plan:  Problem List Items Addressed This Visit   None      No orders of the defined types were placed in this encounter.    Gerre Scull, NP

## 2021-02-04 NOTE — Assessment & Plan Note (Signed)
Noted after working outside. Also an area to left lateral ankle which could be a bite. Allergic contact dermatitis versus tick disease. Will treat with prednisone taper and doxycycline. Follow-up if symptoms worsen or do not improve. Will check for lyme and rocky mountain spotted fever. Discussed ER precautions.

## 2021-02-08 LAB — LYME DISEASE, WESTERN BLOT
IgG P18 Ab.: ABSENT
IgG P23 Ab.: ABSENT
IgG P28 Ab.: ABSENT
IgG P30 Ab.: ABSENT
IgG P39 Ab.: ABSENT
IgG P41 Ab.: ABSENT
IgG P45 Ab.: ABSENT
IgG P58 Ab.: ABSENT
IgG P66 Ab.: ABSENT
IgG P93 Ab.: ABSENT
IgM P23 Ab.: ABSENT
IgM P39 Ab.: ABSENT
IgM P41 Ab.: ABSENT
Lyme IgG Wb: NEGATIVE
Lyme IgM Wb: NEGATIVE

## 2021-02-08 LAB — ROCKY MTN SPOTTED FVR ABS PNL(IGG+IGM)
RMSF IgG: POSITIVE — AB
RMSF IgM: 0.29 index (ref 0.00–0.89)

## 2021-02-08 LAB — RMSF, IGG, IFA: RMSF, IGG, IFA: 1:128 {titer} — ABNORMAL HIGH

## 2021-02-11 ENCOUNTER — Other Ambulatory Visit: Payer: Self-pay | Admitting: Family Medicine

## 2021-02-11 NOTE — Telephone Encounter (Signed)
Requested Prescriptions  Pending Prescriptions Disp Refills  . meloxicam (MOBIC) 7.5 MG tablet [Pharmacy Med Name: MELOXICAM TABS 7.5MG ] 90 tablet 3    Sig: TAKE 1 TABLET DAILY     Analgesics:  COX2 Inhibitors Passed - 02/11/2021 12:35 AM      Passed - HGB in normal range and within 360 days    Hemoglobin  Date Value Ref Range Status  01/21/2021 14.2 11.1 - 15.9 g/dL Final         Passed - Cr in normal range and within 360 days    Creatinine  Date Value Ref Range Status  10/24/2014 0.76 0.60 - 1.30 mg/dL Final   Creatinine, Ser  Date Value Ref Range Status  01/21/2021 0.68 0.57 - 1.00 mg/dL Final         Passed - Patient is not pregnant      Passed - Valid encounter within last 12 months    Recent Outpatient Visits          1 week ago Rash of entire body   [facility] [place], Lauren A, NP   3 weeks ago Other specified hypothyroidism   [facility], Megan P, DO   6 months ago Sinus pressure   [facility] [place], Campbell T, NP   11 months ago Routine general medical examination at a health care facility   [facility] [place], [place], DO   1 year ago Essential hypertension   [facility] [place], Oralia Rud, DO      Future Appointments            In 3 weeks Laural Benes, Oralia Rud, DO [facility], [facility]

## 2021-02-20 ENCOUNTER — Telehealth: Payer: Self-pay | Admitting: Family Medicine

## 2021-02-20 NOTE — Telephone Encounter (Signed)
Called Trey Paula pt's husband to schedule appt no answer no vm

## 2021-02-20 NOTE — Telephone Encounter (Signed)
Pts husband called stating that the pt is experiencing a lot of pain in her neck. He states that 2 days she has noticed that pain worsening. He is requesting to have PCP send something to the pharmacy to help with the pain. Please advise. ]     [facility] [phone] - [place], [place] - [address] AT [facility] OF [address]  [address] [place] [address]  Phone: [phone] Fax: [phone]  Hours: Not open 24 hours

## 2021-02-22 NOTE — Telephone Encounter (Signed)
Please make sure she's taking her flexeril and her naproxen and if shed like to see someone else- they should be able to get her in

## 2021-02-22 NOTE — Telephone Encounter (Signed)
fyi

## 2021-02-22 NOTE — Telephone Encounter (Signed)
Patient scheduled with Lauren.  

## 2021-02-22 NOTE — Telephone Encounter (Signed)
FYI PT scheduled for 02/27/2021

## 2021-02-25 ENCOUNTER — Other Ambulatory Visit: Payer: Self-pay

## 2021-02-25 ENCOUNTER — Ambulatory Visit: Payer: BC Managed Care – PPO | Admitting: Nurse Practitioner

## 2021-02-25 ENCOUNTER — Encounter: Payer: Self-pay | Admitting: Nurse Practitioner

## 2021-02-25 VITALS — BP 130/80 | HR 89 | Temp 99.3°F | Wt 217.6 lb

## 2021-02-25 DIAGNOSIS — M5412 Radiculopathy, cervical region: Secondary | ICD-10-CM | POA: Insufficient documentation

## 2021-02-25 MED ORDER — CYCLOBENZAPRINE HCL 10 MG PO TABS: 10.0000 mg | ORAL_TABLET | Freq: Three times a day (TID) | ORAL | 1 refills | Status: DC | PRN

## 2021-02-25 NOTE — Progress Notes (Signed)
Acute Office Visit  Subjective:    Patient ID: Emily [name], female    DOB: 1961-02-13, 60 y.o.   MRN: [id number]  Chief Complaint  Patient presents with  . Neck Pain    Pt states she has had neck pain for a while, states it has been worse in the last week and a half. States it feels numb at the bottom of her neck and that she has known degeneration in that area. States the pain is worse at night.     HPI Patient is in today for neck pain. She has a history of chronic neck pain but it has worsened in the last week and a half. She had x-rays last month which showed degenerative disc at C5-C6 and C6-C7.   NECK PAIN  Duration: chronic, worst in last 1.5 weeks Mechanism of injury: unknown Location: posterior Onset:gradual Severity: 7/10  Quality:  aching and pressure-like, tingling Frequency: constant Radiation: yes down to right shoulder Aggravating factors: lifting, movement and sleep  Alleviating factors: NSAIDs  Status: worse Treatments attempted: heat and aleve  Relief with NSAIDs?:  mild Weakness: no Numbness: yes Decreased grip strength: yes Redness: no Swelling: no Bruising: no Fevers: no   Past Medical History:  Diagnosis Date  . Anxiety   . Back pain   . Chest pain    a. 10/2013 Neg ETT; b. 10/2015 Refused MV->Cath: nl cors, nl EF.  . Degenerative disc disease, lumbar   . Depression   . Essential hypertension   . Fatigue   . GERD (gastroesophageal reflux disease)   . Hypothyroidism   . Joint pain in fingers of both hands   . Morbid obesity ([place])   . Myalgia     Past Surgical History:  Procedure Laterality Date  . CARDIAC CATHETERIZATION N/A 11/14/2015   Procedure: Left Heart Cath and Coronary Angiography;  Surgeon: Wellington Hampshire, MD;  Location: [facility];  Service: Cardiovascular;  Laterality: N/A;  . CARPAL TUNNEL RELEASE Left 07/03/2015   Procedure: CARPAL TUNNEL RELEASE;  Surgeon: Christophe Louis, MD;  Location: [facility];  Service:  Orthopedics;  Laterality: Left;  . CARPAL TUNNEL RELEASE Right 09/07/2015   Procedure: CARPAL TUNNEL RELEASE;  Surgeon: Christophe Louis, MD;  Location: [facility];  Service: Orthopedics;  Laterality: Right;  . CHOLECYSTECTOMY    . KNEE SURGERY Right   . TUBAL LIGATION      Family History  Problem Relation Age of Onset  . Stroke Mother   . Heart failure Mother   . Heart disease Mother   . Hypertension Mother   . Thyroid disease Mother   . Depression Mother   . Anxiety disorder Mother   . Obesity Mother   . Heart attack Father 87  . Hypertension Father   . Hyperlipidemia Father   . Alzheimer's disease Father   . Heart disease Father   . Obesity Father   . CAD Cousin 69       passed  . Cancer Paternal Uncle        prostate  . Diabetes Maternal Grandmother   . Diabetes Paternal Grandmother     Social History   Socioeconomic History  . Marital status: Married    Spouse name: Dellis Filbert  . Number of children: Not on file  . Years of education: Not on file  . Highest education level: Not on file  Occupational History  . Occupation: retired from [employer] 30 yrs of service  Tobacco Use  .  Smoking status: Former Smoker    Packs/day: 0.25    Years: 10.00    Pack years: 2.50    Types: Cigarettes    Quit date: 07/02/1995    Years since quitting: 25.6  . Smokeless tobacco: Never Used  Vaping Use  . Vaping Use: Never used  Substance and Sexual Activity  . Alcohol use: No    Alcohol/week: 0.0 standard drinks  . Drug use: No  . Sexual activity: Yes    Partners: Male  Other Topics Concern  . Not on file  Social History Narrative  . Not on file   Social Determinants of Health   Financial Resource Strain: Not on file  Food Insecurity: Not on file  Transportation Needs: Not on file  Physical Activity: Not on file  Stress: Not on file  Social Connections: Not on file  Intimate Partner Violence: Not on file    Outpatient Medications Prior to Visit  Medication Sig  Dispense Refill  . fluticasone (CUTIVATE) 0.05 % cream as needed.    . fluticasone (FLONASE) 50 MCG/ACT nasal spray Place 2 sprays into both nostrils daily. 16 g 6  . hydrochlorothiazide (HYDRODIURIL) 25 MG tablet Take 1 tablet (25 mg total) by mouth daily. 90 tablet 1  . ketoconazole (NIZORAL) 2 % cream daily as needed.    Marland Kitchen levothyroxine (SYNTHROID) 137 MCG tablet Take 1 tablet (137 mcg total) by mouth daily before breakfast. 90 tablet 2  . losartan (COZAAR) 100 MG tablet Take 1 tablet (100 mg total) by mouth daily. 90 tablet 1  . meloxicam (MOBIC) 7.5 MG tablet TAKE 1 TABLET DAILY 90 tablet 3  . Naproxen Sodium 220 MG CAPS Take 1 capsule by mouth as needed.    . nystatin ointment (MYCOSTATIN) Apply 1 application topically 2 (two) times daily. 30 g 0  . Omega-3 Fatty Acids (FISH OIL) 500 MG CAPS Take by mouth.    Marland Kitchen omeprazole (PRILOSEC) 20 MG capsule Take 20 mg by mouth daily as needed.    . venlafaxine XR (EFFEXOR-XR) 150 MG 24 hr capsule Take 1 capsule (150 mg total) by mouth daily with breakfast. To be taken with her 61m for 2238mtotal 90 capsule 1  . venlafaxine XR (EFFEXOR-XR) 75 MG 24 hr capsule Take 1 capsule (75 mg total) by mouth daily with breakfast. To be taken with her 15024mor 225m57mtal 90 capsule 1  . cyclobenzaprine (FLEXERIL) 10 MG tablet Take 10 mg by mouth as needed.    . methocarbamol (ROBAXIN) 500 MG tablet Take 500 mg by mouth as needed.    . Vitamin D, Ergocalciferol, (DRISDOL) 1.25 MG (50000 UNIT) CAPS capsule Take 1 capsule (50,000 Units total) by mouth every 7 (seven) days. (Patient not taking: Reported on 02/25/2021) 12 capsule 0  . doxycycline (VIBRA-TABS) 100 MG tablet Take 1 tablet (100 mg total) by mouth 2 (two) times daily. 20 tablet 0  . predniSONE (DELTASONE) 10 MG tablet Take 6 tablets tomorrow and the next day, then 5 tablets the next 2 days, then decrease by 1 tablet every other day until gone 42 tablet 0   No facility-administered medications prior to  visit.    No Known Allergies  Review of Systems  Constitutional: Positive for fatigue. Negative for fever.  Respiratory: Negative.   Cardiovascular: Negative.   Gastrointestinal: Negative.   Musculoskeletal: Positive for neck pain.  Neurological: Positive for numbness (back of neck).       Objective:    Physical Exam Vitals and  nursing note reviewed.  Constitutional:      General: She is not in acute distress.    Appearance: Normal appearance.  HENT:     Head: Normocephalic.  Eyes:     Conjunctiva/sclera: Conjunctivae normal.  Cardiovascular:     Rate and Rhythm: Normal rate and regular rhythm.     Pulses: Normal pulses.     Heart sounds: Normal heart sounds.  Pulmonary:     Effort: Pulmonary effort is normal.     Breath sounds: Normal breath sounds.  Musculoskeletal:        General: No tenderness. Normal range of motion.     Cervical back: Normal range of motion.  Skin:    General: Skin is warm.  Neurological:     General: No focal deficit present.     Mental Status: She is alert and oriented to person, place, and time.     Sensory: Sensory deficit (numbness and limited sensation on posterior neck to touch) present.     Motor: No weakness.  Psychiatric:        Mood and Affect: Mood normal.        Behavior: Behavior normal.        Thought Content: Thought content normal.        Judgment: Judgment normal.     BP 130/80   Pulse 89   Temp 99.3 F (37.4 C) (Oral)   Wt 217 lb 9.6 oz (98.7 kg)   SpO2 96%   BMI 37.35 kg/m  Wt Readings from Last 3 Encounters:  02/25/21 217 lb 9.6 oz (98.7 kg)  02/04/21 216 lb 12.8 oz (98.3 kg)  01/21/21 217 lb 6.4 oz (98.6 kg)    Health Maintenance Due  Topic Date Due  . MAMMOGRAM  12/03/2018  . COLONOSCOPY (Pts 45-28yr Insurance coverage will need to be confirmed)  10/23/2019  . PAP SMEAR-Modifier  04/07/2021    There are no preventive care reminders to display for this patient.   Lab Results  Component Value Date    TSH 3.630 01/21/2021   Lab Results  Component Value Date   WBC 8.1 01/21/2021   HGB 14.2 01/21/2021   HCT 42.7 01/21/2021   MCV 92 01/21/2021   PLT 287 01/21/2021   Lab Results  Component Value Date   NA 138 01/21/2021   K 4.1 01/21/2021   CO2 22 01/21/2021   GLUCOSE 93 01/21/2021   BUN 22 01/21/2021   CREATININE 0.68 01/21/2021   BILITOT 0.4 01/21/2021   ALKPHOS 79 01/21/2021   AST 24 01/21/2021   ALT 30 01/21/2021   PROT 7.7 01/21/2021   ALBUMIN 4.6 01/21/2021   CALCIUM 10.1 01/21/2021   ANIONGAP 9 11/12/2015   EGFR 99 01/21/2021   Lab Results  Component Value Date   CHOL 236 (H) 01/21/2021   Lab Results  Component Value Date   HDL 45 01/21/2021   Lab Results  Component Value Date   LDLCALC 147 (H) 01/21/2021   Lab Results  Component Value Date   TRIG 242 (H) 01/21/2021   No results found for: CHOLHDL Lab Results  Component Value Date   HGBA1C 5.5 01/21/2021       Assessment & Plan:   Problem List Items Addressed This Visit      Nervous and Auditory   Cervical radiculopathy - Primary    Exacerbation of chronic pain with numbness to posterior neck which is new. Will get cervical MRI and refer to PT. Continue mobic and heat. Flexeril  refill sent to pharmacy. Can also try voltaren gel. Keep appointment next week for physical. Follow-up if symptoms worsen.       Relevant Medications   cyclobenzaprine (FLEXERIL) 10 MG tablet   Other Relevant Orders   MR Cervical Spine Wo Contrast   Ambulatory referral to Physical Therapy       Meds ordered this encounter  Medications  . cyclobenzaprine (FLEXERIL) 10 MG tablet    Sig: Take 1 tablet (10 mg total) by mouth 3 (three) times daily as needed.    Dispense:  30 tablet    Refill:  1     Charyl Dancer, NP

## 2021-02-25 NOTE — Patient Instructions (Signed)
Cervical Strain and Sprain Rehab Ask your health care provider which exercises are safe for you. Do exercises exactly as told by your health care provider and adjust them as directed. It is normal to feel mild stretching, pulling, tightness, or discomfort as you do these exercises. Stop right away if you feel sudden pain or your pain gets worse. Do not begin these exercises until told by your health care provider. Stretching and range-of-motion exercises Cervical side bending 1. Using good posture, sit on a stable chair or stand up. 2. Without moving your shoulders, slowly tilt your left / right ear to your shoulder until you feel a stretch in the opposite side neck muscles. You should be looking straight ahead. 3. Hold for __________ seconds. 4. Repeat with the other side of your neck. Repeat __________ times. Complete this exercise __________ times a day.   Cervical rotation 1. Using good posture, sit on a stable chair or stand up. 2. Slowly turn your head to the side as if you are looking over your left / right shoulder. ? Keep your eyes level with the ground. ? Stop when you feel a stretch along the side and the back of your neck. 3. Hold for __________ seconds. 4. Repeat this by turning to your other side. Repeat __________ times. Complete this exercise __________ times a day.   Thoracic extension and pectoral stretch 1. Roll a towel or a small blanket so it is about 4 inches (10 cm) in diameter. 2. Lie down on your back on a firm surface. 3. Put the towel lengthwise, under your spine in the middle of your back. It should not be under your shoulder blades. The towel should line up with your spine from your middle back to your lower back. 4. Put your hands behind your head and let your elbows fall out to your sides. 5. Hold for __________ seconds. Repeat __________ times. Complete this exercise __________ times a day. Strengthening exercises Isometric upper cervical flexion 1. Lie on  your back with a thin pillow behind your head and a small rolled-up towel under your neck. 2. Gently tuck your chin toward your chest and nod your head down to look toward your feet. Do not lift your head off the pillow. 3. Hold for __________ seconds. 4. Release the tension slowly. Relax your neck muscles completely before you repeat this exercise. Repeat __________ times. Complete this exercise __________ times a day. Isometric cervical extension 1. Stand about 6 inches (15 cm) away from a wall, with your back facing the wall. 2. Place a soft object, about 6-8 inches (15-20 cm) in diameter, between the back of your head and the wall. A soft object could be a small pillow, a ball, or a folded towel. 3. Gently tilt your head back and press into the soft object. Keep your jaw and forehead relaxed. 4. Hold for __________ seconds. 5. Release the tension slowly. Relax your neck muscles completely before you repeat this exercise. Repeat __________ times. Complete this exercise __________ times a day.   Posture and body mechanics Body mechanics refers to the movements and positions of your body while you do your daily activities. Posture is part of body mechanics. Good posture and healthy body mechanics can help to relieve stress in your body's tissues and joints. Good posture means that your spine is in its natural S-curve position (your spine is neutral), your shoulders are pulled back slightly, and your head is not tipped forward. The following are general guidelines  for applying improved posture and body mechanics to your everyday activities. Sitting 1. When sitting, keep your spine neutral and keep your feet flat on the floor. Use a footrest, if necessary, and keep your thighs parallel to the floor. Avoid rounding your shoulders, and avoid tilting your head forward. 2. When working at a desk or a computer, keep your desk at a height where your hands are slightly lower than your elbows. Slide your  chair under your desk so you are close enough to maintain good posture. 3. When working at a computer, place your monitor at a height where you are looking straight ahead and you do not have to tilt your head forward or downward to look at the screen.   Standing  When standing, keep your spine neutral and keep your feet about hip-width apart. Keep a slight bend in your knees. Your ears, shoulders, and hips should line up.  When you do a task in which you stand in one place for a long time, place one foot up on a stable object that is 2-4 inches (5-10 cm) high, such as a footstool. This helps keep your spine neutral.   Resting When lying down and resting, avoid positions that are most painful for you. Try to support your neck in a neutral position. You can use a contour pillow or a small rolled-up towel. Your pillow should support your neck but not push on it. This information is not intended to replace advice given to you by your health care provider. Make sure you discuss any questions you have with your health care provider. Document Revised: 01/26/2019 Document Reviewed: 07/07/2018 Elsevier Patient Education  2021 [employer] Cervical Radiculopathy  Cervical radiculopathy happens when a nerve in the neck (a cervical nerve) is pinched or bruised. This condition can happen because of an injury to the cervical spine (vertebrae) in the neck, or as part of the normal aging process. Pressure on the cervical nerves can cause pain or numbness that travels from the neck all the way down into the arm and fingers. Usually, this condition gets better with rest. Treatment may be needed if the condition does not improve. What are the causes? This condition may be caused by:  A neck injury.  A bulging (herniated) disk.  Muscle spasms.  Muscle tightness in the neck because of overuse.  Arthritis.  Breakdown or degeneration in the bones and joints of the spine (spondylosis) due to aging.  Bone  spurs that may develop near the cervical nerves. What are the signs or symptoms? Symptoms of this condition include:  Pain. The pain may travel from the neck to the arm and hand. The pain can be severe or irritating. It may be worse when you move your neck.  Numbness or tingling in your arm or hand.  Weakness in the affected arm and hand, in severe cases. How is this diagnosed? This condition may be diagnosed based on your symptoms, your medical history, and a physical exam. You may also have tests, including:  X-rays.  A CT scan.  An MRI.  An electromyogram (EMG).  Nerve conduction tests. How is this treated? In many cases, treatment is not needed for this condition. With rest, the condition usually gets better over time. If treatment is needed, options may include:  Wearing a soft neck collar (cervical collar) for short periods of time, as told by your health care provider.  Doing physical therapy to strengthen your neck muscles.  Taking medicines, such as  NSAIDs or oral corticosteroids.  Having spinal injections, in severe cases.  Having surgery. This may be needed if other treatments do not help. Different types of surgery may be done depending on the cause of this condition. Follow these instructions at home: If you have a cervical collar:  Wear it as told by your health care provider. Remove it only as told by your health care provider.  Ask your health care provider if you can remove the collar for cleaning and bathing. If you are allowed to remove the collar for cleaning or bathing: ? Follow instructions from your health care provider about how to remove the collar safely. ? Clean the collar by wiping it with mild soap and water and drying it completely. ? Take out any removable pads in the collar every 1-2 days, and wash them by hand with soap and water. Let them air-dry completely before you put them back in the collar. ? Check your skin under the collar for  irritation or sores. If you see any, tell your health care provider. Managing pain  Take over-the-counter and prescription medicines only as told by your health care provider.  If directed, put ice on the affected area. ? If you have a soft neck collar, remove it as told by your health care provider. ? Put ice in a plastic bag. ? Place a towel between your skin and the bag. ? Leave the ice on for 20 minutes, 2-3 times a day.  If applying ice does not help, you can try using heat. Use the heat source that your health care provider recommends, such as a moist heat pack or a heating pad. ? Place a towel between your skin and the heat source. ? Leave the heat on for 20-30 minutes. ? Remove the heat if your skin turns bright red. This is especially important if you are unable to feel pain, heat, or cold. You may have a greater risk of getting burned.  Try a gentle neck and shoulder massage to help relieve symptoms.      Activity  Rest as needed.  Return to your normal activities as told by your health care provider. Ask your health care provider what activities are safe for you.  Do stretching and strengthening exercises as told by your health care provider or physical therapist.  Do not lift anything that is heavier than 10 lb (4.5 kg) until your health care provider tells you that it is safe. General instructions  Use a flat pillow when you sleep.  Do not drive while wearing a cervical collar. If you do not have a cervical collar, ask your health care provider if it is safe to drive while your neck heals.  Ask your health care provider if the medicine prescribed to you requires you to avoid driving or using heavy machinery.  Do not use any products that contain nicotine or tobacco, such as cigarettes, e-cigarettes, and chewing tobacco. These can delay healing. If you need help quitting, ask your health care provider.  Keep all follow-up visits as told by your health care provider.  This is important. Contact a health care provider if:  Your condition does not improve with treatment. Get help right away if:  Your pain gets much worse and cannot be controlled with medicines.  You have weakness or numbness in your hand, arm, face, or leg.  You have a high fever.  You have a stiff, rigid neck.  You lose control of your bowels or your  bladder (have incontinence).  You have trouble with walking, balance, or speaking. Summary  Cervical radiculopathy happens when a nerve in the neck is pinched or bruised.  A nerve can get pinched from a bulging disk, arthritis, muscle spasms, or an injury to the neck.  Symptoms include pain, tingling, or numbness radiating from the neck into the arm or hand. Weakness can also occur in severe cases.  Treatment may include rest, wearing a cervical collar, and physical therapy. Medicines may be prescribed to help with pain. In severe cases, injections or surgery may be needed. This information is not intended to replace advice given to you by your health care provider. Make sure you discuss any questions you have with your health care provider. Document Revised: 08/27/2018 Document Reviewed: 08/27/2018 Elsevier Patient Education  2021 [employer].

## 2021-02-25 NOTE — Assessment & Plan Note (Signed)
Exacerbation of chronic pain with numbness to posterior neck which is new. Will get cervical MRI and refer to PT. Continue mobic and heat. Flexeril refill sent to pharmacy. Can also try voltaren gel. Keep appointment next week for physical. Follow-up if symptoms worsen.

## 2021-02-27 ENCOUNTER — Ambulatory Visit: Payer: BC Managed Care – PPO | Admitting: Family Medicine

## 2021-03-01 ENCOUNTER — Telehealth: Payer: Self-pay

## 2021-03-01 NOTE — Telephone Encounter (Signed)
Copied from CRM [id number]. Topic: General - Other >> Mar 01, 2021  3:14 PM Pawlus, Maxine Glenn A wrote: Reason for CRM: Pt wanted an update on a referral for an MRI, pt stated she still has not been contacted or able to set up an appt, please advise.

## 2021-03-04 ENCOUNTER — Other Ambulatory Visit: Admission: RE | Admit: 2021-03-04 | Payer: BC Managed Care – PPO | Source: Ambulatory Visit

## 2021-03-04 ENCOUNTER — Ambulatory Visit (INDEPENDENT_AMBULATORY_CARE_PROVIDER_SITE_OTHER): Payer: BC Managed Care – PPO | Admitting: Family Medicine

## 2021-03-04 ENCOUNTER — Other Ambulatory Visit: Payer: Self-pay

## 2021-03-04 ENCOUNTER — Other Ambulatory Visit (HOSPITAL_COMMUNITY)
Admission: RE | Admit: 2021-03-04 | Discharge: 2021-03-04 | Disposition: A | Discharge status: Home / Self Care | Payer: BC Managed Care – PPO | Source: Ambulatory Visit | Attending: Family Medicine | Admitting: Family Medicine

## 2021-03-04 ENCOUNTER — Encounter: Payer: Self-pay | Admitting: Family Medicine

## 2021-03-04 VITALS — BP 120/80 | HR 86 | Temp 98.2°F | Ht 63.0 in | Wt 219.6 lb

## 2021-03-04 DIAGNOSIS — Z0000 Encounter for general adult medical examination without abnormal findings: Secondary | ICD-10-CM | POA: Diagnosis not present

## 2021-03-04 DIAGNOSIS — Z124 Encounter for screening for malignant neoplasm of cervix: Secondary | ICD-10-CM | POA: Insufficient documentation

## 2021-03-04 NOTE — Telephone Encounter (Signed)
Pt verbalized understanding.

## 2021-03-04 NOTE — Progress Notes (Signed)
BP 120/80   Pulse 86   Temp 98.2 F (36.8 C)   Ht  (1.6 m)   Wt 219 lb 9.6 oz (99.6 kg)   SpO2 97%   BMI 38.90 kg/m    Subjective:    Patient ID: Emily [name], female    DOB: 15-Aug-1961, 60 y.o.   MRN: [id number]  HPI: Emily [name] is a 60 y.o. female presenting on 03/04/2021 for comprehensive medical examination. Current medical complaints include:none  She currently lives with: husband Menopausal Symptoms: no  Depression Screen done today and results listed below:  Depression screen [facility] 2/9 03/04/2021 01/21/2021 02/28/2020 06/07/2019 11/17/2018  Decreased Interest 1 0 0 1 2  Down, Depressed, Hopeless 0 0 0 0 3  PHQ - 2 Score 1 0 0 1 5  Altered sleeping 2 - Tired, decreased energy 1 - 0 3 3  Change in appetite 0 - 0 0 2  Feeling bad or failure about yourself  0 - 0 0 1  Trouble concentrating 1 - Moving slowly or fidgety/restless 0 - 0 0 0  Suicidal thoughts 0 - 0 0 0  PHQ-9 Score 5 - Difficult doing work/chores Not difficult at all - - Somewhat difficult Not difficult at all    Past Medical History:  Past Medical History:  Diagnosis Date  . Anxiety   . Back pain   . Chest pain    a. 10/2013 Neg ETT; b. 10/2015 Refused MV->Cath: nl cors, nl EF.  . Degenerative disc disease, lumbar   . Depression   . Essential hypertension   . Fatigue   . GERD (gastroesophageal reflux disease)   . Hypothyroidism   . Joint pain in fingers of both hands   . Morbid obesity (HCC)   . Myalgia     Surgical History:  Past Surgical History:  Procedure Laterality Date  . CARDIAC CATHETERIZATION N/A 11/14/2015   Procedure: Left Heart Cath and Coronary Angiography;  Surgeon: Iran Ouch, MD;  Location: [facility];  Service: Cardiovascular;  Laterality: N/A;  . CARPAL TUNNEL RELEASE Left 07/03/2015   Procedure: CARPAL TUNNEL RELEASE;  Surgeon: Myra Rude, MD;  Location: [facility] ORS;  Service: Orthopedics;  Laterality: Left;  . CARPAL TUNNEL RELEASE  Right 09/07/2015   Procedure: CARPAL TUNNEL RELEASE;  Surgeon: Myra Rude, MD;  Location: [facility] ORS;  Service: Orthopedics;  Laterality: Right;  . CHOLECYSTECTOMY    . KNEE SURGERY Right   . TUBAL LIGATION      Medications:  Current Outpatient Medications on File Prior to Visit  Medication Sig  . cyclobenzaprine (FLEXERIL) 10 MG tablet Take 1 tablet (10 mg total) by mouth 3 (three) times daily as needed.  . fluticasone (CUTIVATE) 0.05 % cream as needed.  . fluticasone (FLONASE) 50 MCG/ACT nasal spray Place 2 sprays into both nostrils daily.  . hydrochlorothiazide (HYDRODIURIL) 25 MG tablet Take 1 tablet (25 mg total) by mouth daily.  Marland Kitchen ketoconazole (NIZORAL) 2 % cream daily as needed.  Marland Kitchen levothyroxine (SYNTHROID) 137 MCG tablet Take 1 tablet (137 mcg total) by mouth daily before breakfast.  . losartan (COZAAR) 100 MG tablet Take 1 tablet (100 mg total) by mouth daily.  . meloxicam (MOBIC) 7.5 MG tablet TAKE 1 TABLET DAILY  . Naproxen Sodium 220 MG CAPS Take 1 capsule by mouth as needed.  . nystatin ointment (MYCOSTATIN) Apply 1 application topically 2 (two) times daily.  Marland Kitchen  Omega-3 Fatty Acids (FISH OIL) 500 MG CAPS Take by mouth.  Marland Kitchen omeprazole (PRILOSEC) 20 MG capsule Take 20 mg by mouth daily as needed.  . venlafaxine XR (EFFEXOR-XR) 150 MG 24 hr capsule Take 1 capsule (150 mg total) by mouth daily with breakfast. To be taken with her 75mg  for 225mg  total  . venlafaxine XR (EFFEXOR-XR) 75 MG 24 hr capsule Take 1 capsule (75 mg total) by mouth daily with breakfast. To be taken with her 150mg  for 225mg  total  . Vitamin D, Ergocalciferol, (DRISDOL) 1.25 MG (50000 UNIT) CAPS capsule Take 1 capsule (50,000 Units total) by mouth every 7 (seven) days. (Patient not taking: Reported on 03/04/2021)   No current facility-administered medications on file prior to visit.    Allergies:  No Known Allergies  Social History:  Social History   Socioeconomic History  . Marital status: Married     Spouse name:  . Number of children: Not on file  . Years of education: Not on file  . Highest education level: Not on file  Occupational History  . Occupation: retired from [employer] 30 yrs of service  Tobacco Use  . Smoking status: Former Smoker    Packs/day: 0.25    Years: 10.00    Pack years: 2.50    Types: Cigarettes    Quit date: 07/02/1995    Years since quitting: 25.6  . Smokeless tobacco: Never Used  Vaping Use  . Vaping Use: Never used  Substance and Sexual Activity  . Alcohol use: No    Alcohol/week: 0.0 standard drinks  . Drug use: No  . Sexual activity: Yes    Partners: Male  Other Topics Concern  . Not on file  Social History Narrative  . Not on file   Social Determinants of Health   Financial Resource Strain: Not on file  Food Insecurity: Not on file  Transportation Needs: Not on file  Physical Activity: Not on file  Stress: Not on file  Social Connections: Not on file  Intimate Partner Violence: Not on file   Social History   Tobacco Use  Smoking Status Former Smoker  . Packs/day: 0.25  . Years: 10.00  . Pack years: 2.50  . Types: Cigarettes  . Quit date: 07/02/1995  . Years since quitting: 25.6  Smokeless Tobacco Never Used   Social History   Substance and Sexual Activity  Alcohol Use No  . Alcohol/week: 0.0 standard drinks    Family History:  Family History  Problem Relation Age of Onset  . Stroke Mother   . Heart failure Mother   . Heart disease Mother   . Hypertension Mother   . Thyroid disease Mother   . Depression Mother   . Anxiety disorder Mother   . Obesity Mother   . Heart attack Father 34  . Hypertension Father   . Hyperlipidemia Father   . Alzheimer's disease Father   . Heart disease Father   . Obesity Father   . CAD Cousin 53       passed  . Cancer Paternal Uncle        prostate  . Diabetes Maternal Grandmother   . Diabetes Paternal Grandmother     Past medical history, surgical history,  medications, allergies, family history and social history reviewed with patient today and changes made to appropriate areas of the chart.   Review of Systems  Constitutional: Negative.   HENT: Negative.   Eyes: Negative.   Respiratory: Positive for shortness of breath.  Negative for cough, hemoptysis, sputum production and wheezing.   Cardiovascular: Negative.   Gastrointestinal: Positive for blood in stool and heartburn. Negative for abdominal pain, constipation, diarrhea, melena, nausea and vomiting.  Genitourinary: Negative.   Musculoskeletal: Positive for back pain, myalgias and neck pain. Negative for falls and joint pain.  Skin: Negative.        Has been having folliculitis  Neurological: Positive for tingling. Negative for dizziness, tremors, sensory change, speech change, focal weakness, seizures, loss of consciousness, weakness and headaches.  Endo/Heme/Allergies: Negative.   Psychiatric/Behavioral: Negative.    All other ROS negative except what is listed above and in the HPI.      Objective:    BP 120/80   Pulse 86   Temp 98.2 F (36.8 C)   Ht 5\' 3"  (1.6 m)   Wt 219 lb 9.6 oz (99.6 kg)   SpO2 97%   BMI 38.90 kg/m   Wt Readings from Last 3 Encounters:  03/04/21 219 lb 9.6 oz (99.6 kg)  02/25/21 217 lb 9.6 oz (98.7 kg)  02/04/21 216 lb 12.8 oz (98.3 kg)    Physical Exam Vitals and nursing note reviewed. Exam conducted with a chaperone present.  Constitutional:      General: She is not in acute distress.    Appearance: Normal appearance. She is not ill-appearing, toxic-appearing or diaphoretic.  HENT:     Head: Normocephalic and atraumatic.     Right Ear: Tympanic membrane, ear canal and external ear normal. There is no impacted cerumen.     Left Ear: Tympanic membrane, ear canal and external ear normal. There is no impacted cerumen.     Nose: Nose normal. No congestion or rhinorrhea.     Mouth/Throat:     Mouth: Mucous membranes are moist.     Pharynx:  Oropharynx is clear. No oropharyngeal exudate or posterior oropharyngeal erythema.  Eyes:     General: No scleral icterus.       Right eye: No discharge.        Left eye: No discharge.     Extraocular Movements: Extraocular movements intact.     Conjunctiva/sclera: Conjunctivae normal.     Pupils: Pupils are equal, round, and reactive to light.  Neck:     Vascular: No carotid bruit.  Cardiovascular:     Rate and Rhythm: Normal rate and regular rhythm.     Pulses: Normal pulses.     Heart sounds: No murmur heard. No friction rub. No gallop.   Pulmonary:     Effort: Pulmonary effort is normal. No respiratory distress.     Breath sounds: Normal breath sounds. No stridor. No wheezing, rhonchi or rales.  Chest:     Chest wall: No tenderness.  Breasts:     Right: Normal. No swelling, bleeding, inverted nipple, mass, nipple discharge, skin change or tenderness.     Left: Normal. No swelling, bleeding, inverted nipple, mass, nipple discharge, skin change or tenderness.    Abdominal:     General: Abdomen is flat. Bowel sounds are normal. There is no distension.     Palpations: Abdomen is soft. There is no mass.     Tenderness: There is no abdominal tenderness. There is no right CVA tenderness, left CVA tenderness, guarding or rebound.     Hernia: No hernia is present.  Genitourinary:    Labia:        Right: No rash, tenderness, lesion or injury.        Left: No rash, tenderness,  lesion or injury.      Urethra: No prolapse, urethral pain, urethral swelling or urethral lesion.     Vagina: Normal.     Cervix: Normal.     Uterus: Normal.      Adnexa: Right adnexa normal and left adnexa normal.  Musculoskeletal:        General: No swelling, tenderness, deformity or signs of injury.     Cervical back: Normal range of motion and neck supple. No rigidity. No muscular tenderness.     Right lower leg: No edema.     Left lower leg: No edema.  Lymphadenopathy:     Cervical: No cervical  adenopathy.  Skin:    General: Skin is warm and dry.     Capillary Refill: Capillary refill takes less than 2 seconds.     Coloration: Skin is not jaundiced or pale.     Findings: No bruising, erythema, lesion or rash.  Neurological:     General: No focal deficit present.     Mental Status: She is alert and oriented to person, place, and time. Mental status is at baseline.     Cranial Nerves: No cranial nerve deficit.     Sensory: No sensory deficit.     Motor: No weakness.     Coordination: Coordination normal.     Gait: Gait normal.     Deep Tendon Reflexes: Reflexes normal.  Psychiatric:        Mood and Affect: Mood normal.        Behavior: Behavior normal.        Thought Content: Thought content normal.        Judgment: Judgment normal.     Results for orders placed or performed in visit on 02/04/21  Lyme disease, western blot  Result Value Ref Range     IgG P93 Ab. Absent      IgG P66 Ab. Absent      IgG P58 Ab. Absent      IgG P45 Ab. Absent      IgG P41 Ab. Absent      IgG P39 Ab. Absent      IgG P30 Ab. Absent      IgG P28 Ab. Absent      IgG P23 Ab. Absent      IgG P18 Ab. Absent    Lyme IgG Wb Negative      IgM P41 Ab. Absent      IgM P39 Ab. Absent      IgM P23 Ab. Absent    Lyme IgM Wb Negative   Rocky mtn spotted fvr abs pnl(IgG+IgM)  Result Value Ref Range   RMSF IgG Positive (A) Negative   RMSF IgM 0.29 0.00 - 0.89 index  RMSF, IgG, IFA  Result Value Ref Range   RMSF, IGG, IFA 1:128 (H) Neg <1:64      Assessment & Plan:   Problem List Items Addressed This Visit   None   Visit Diagnoses    Routine general medical examination at a health care facility    -  Primary   Vaccines up to date/declined. Screening labs checked today. Pap done. Mammogram scheduled. Info for GI given. Call with any concerns.    Screening for cervical cancer       Pap done today. Await results.   Relevant Orders   Cytology - PAP       Follow up plan: Return in about  4 weeks (around 04/01/2021) for follow up neck pain.  LABORATORY TESTING:  - Pap smear: pap done  IMMUNIZATIONS:   - Tdap: Tetanus vaccination status reviewed: last tetanus booster within 10 years. - Influenza: Postponed to flu season - Pneumovax: Up to date - Prevnar: Not applicable - COVID: Refused  SCREENING: -Mammogram: Scheduled  - Colonoscopy: Order in patient will call   PATIENT COUNSELING:   Advised to take 1 mg of folate supplement per day if capable of pregnancy.   Sexuality: Discussed sexually transmitted diseases, partner selection, use of condoms, avoidance of unintended pregnancy  and contraceptive alternatives.   Advised to avoid cigarette smoking.  I discussed with the patient that most people either abstain from alcohol or drink within safe limits (<=14/week and <=4 drinks/occasion for males, <=7/weeks and <= 3 drinks/occasion for females) and that the risk for alcohol disorders and other health effects rises proportionally with the number of drinks per week and how often a drinker exceeds daily limits.  Discussed cessation/primary prevention of drug use and availability of treatment for abuse.   Diet: Encouraged to adjust caloric intake to maintain  or achieve ideal body weight, to reduce intake of dietary saturated fat and total fat, to limit sodium intake by avoiding high sodium foods and not adding table salt, and to maintain adequate dietary potassium and calcium preferably from fresh fruits, vegetables, and low-fat dairy products.    stressed the importance of regular exercise  Injury prevention: Discussed safety belts, safety helmets, smoke detector, smoking near bedding or upholstery.   Dental health: Discussed importance of regular tooth brushing, flossing, and dental visits.    NEXT PREVENTATIVE PHYSICAL DUE IN 1 YEAR. Return in about 4 weeks (around 04/01/2021) for follow up neck pain.

## 2021-03-04 NOTE — Patient Instructions (Addendum)
[facility] [place] [address] [place], [place] [address] [phone]

## 2021-03-07 LAB — CYTOLOGY - PAP
Comment: NEGATIVE
Diagnosis: NEGATIVE
High risk HPV: NEGATIVE

## 2021-03-19 ENCOUNTER — Other Ambulatory Visit: Payer: Self-pay | Admitting: Family Medicine

## 2021-03-19 DIAGNOSIS — E038 Other specified hypothyroidism: Secondary | ICD-10-CM

## 2021-03-27 ENCOUNTER — Other Ambulatory Visit: Payer: Self-pay

## 2021-03-27 ENCOUNTER — Ambulatory Visit
Admission: RE | Admit: 2021-03-27 | Discharge: 2021-03-27 | Disposition: A | Discharge status: Home / Self Care | Payer: BC Managed Care – PPO | Source: Ambulatory Visit | Attending: Nurse Practitioner | Admitting: Nurse Practitioner

## 2021-03-27 DIAGNOSIS — M542 Cervicalgia: Secondary | ICD-10-CM | POA: Diagnosis not present

## 2021-03-27 DIAGNOSIS — M5412 Radiculopathy, cervical region: Secondary | ICD-10-CM | POA: Diagnosis not present

## 2021-04-04 ENCOUNTER — Other Ambulatory Visit: Payer: Self-pay

## 2021-04-04 ENCOUNTER — Ambulatory Visit (INDEPENDENT_AMBULATORY_CARE_PROVIDER_SITE_OTHER): Payer: BC Managed Care – PPO | Admitting: Family Medicine

## 2021-04-04 ENCOUNTER — Encounter: Payer: Self-pay | Admitting: Family Medicine

## 2021-04-04 VITALS — BP 125/79 | HR 83 | Temp 98.0°F | Wt 218.8 lb

## 2021-04-04 DIAGNOSIS — L237 Allergic contact dermatitis due to plants, except food: Secondary | ICD-10-CM | POA: Diagnosis not present

## 2021-04-04 DIAGNOSIS — L304 Erythema intertrigo: Secondary | ICD-10-CM

## 2021-04-04 DIAGNOSIS — M5412 Radiculopathy, cervical region: Secondary | ICD-10-CM | POA: Diagnosis not present

## 2021-04-04 MED ORDER — GABAPENTIN 100 MG PO CAPS: 100.0000 mg | ORAL_CAPSULE | Freq: Every day | ORAL | 3 refills | Status: DC

## 2021-04-04 MED ORDER — TRIAMCINOLONE ACETONIDE 0.5 % EX OINT: 1.0000 "application " | TOPICAL_OINTMENT | Freq: Two times a day (BID) | CUTANEOUS | 0 refills | Status: DC

## 2021-04-04 MED ORDER — NYSTATIN 100000 UNIT/GM EX POWD: 1.0000 "application " | Freq: Three times a day (TID) | CUTANEOUS | 0 refills | Status: DC

## 2021-04-04 NOTE — Progress Notes (Signed)
BP 125/79   Pulse 83   Temp 98 F (36.7 C)   Wt 218 lb 12.8 oz (99.2 kg)   SpO2 97%   BMI 38.76 kg/m    Subjective:    Patient ID: Emily [name], female    DOB: 12-Sep-1961, 60 y.o.   MRN: [id number]  HPI: PHILIPPA VESSEY is a 60 y.o. female  Chief Complaint  Patient presents with   Neck Pain    Patient states neck pain is about the same    NECK PAIN FOLLOW UP Status: exacerbated Treatments attempted: rest, ice, heat, APAP, ibuprofen, aleve, muscle relaxer, and HEP  Compliant with recommended treatment: yes Relief with NSAIDs?:  no Location:Left Duration:months Severity: 7/10 Quality: aching and tingling Frequency: constant Radiation: R arm Aggravating factors: lifting and movement Alleviating factors: nothing Weakness:  yes Paresthesias / decreased sensation:  yes  Fevers:  no  RASH Duration:  days  Location: generalized  Itching: yes Burning: yes Redness: yes Oozing: no Scaling: yes Blisters: no Painful: yes Fevers: no Change in detergents/soaps/personal care products: no Recent illness: no Recent travel:no History of same: no Context: stable Alleviating factors: hydrocortisone cream Treatments attempted:hydrocortisone cream Shortness of breath: no  Throat/tongue swelling: no Myalgias/arthralgias: no   Relevant past medical, surgical, family and social history reviewed and updated as indicated. Interim medical history since our last visit reviewed. Allergies and medications reviewed and updated.  Review of Systems  Constitutional: Negative.   Respiratory: Negative.    Cardiovascular: Negative.   Gastrointestinal: Negative.   Musculoskeletal:  Positive for myalgias, neck pain and neck stiffness. Negative for arthralgias, back pain, gait problem and joint swelling.  Neurological:  Positive for weakness and numbness. Negative for dizziness, tremors, seizures, syncope, facial asymmetry, speech difficulty, light-headedness and headaches.   Psychiatric/Behavioral: Negative.     Per HPI unless specifically indicated above     Objective:    BP 125/79   Pulse 83   Temp 98 F (36.7 C)   Wt 218 lb 12.8 oz (99.2 kg)   SpO2 97%   BMI 38.76 kg/m   Wt Readings from Last 3 Encounters:  04/04/21 218 lb 12.8 oz (99.2 kg)  03/04/21 219 lb 9.6 oz (99.6 kg)  02/25/21 217 lb 9.6 oz (98.7 kg)    Physical Exam Vitals and nursing note reviewed.  Constitutional:      General: She is not in acute distress.    Appearance: Normal appearance. She is not ill-appearing, toxic-appearing or diaphoretic.  HENT:     Head: Normocephalic and atraumatic.     Right Ear: External ear normal.     Left Ear: External ear normal.     Nose: Nose normal.     Mouth/Throat:     Mouth: Mucous membranes are moist.     Pharynx: Oropharynx is clear.  Eyes:     General: No scleral icterus.       Right eye: No discharge.        Left eye: No discharge.     Extraocular Movements: Extraocular movements intact.     Conjunctiva/sclera: Conjunctivae normal.     Pupils: Pupils are equal, round, and reactive to light.  Cardiovascular:     Rate and Rhythm: Normal rate and regular rhythm.     Pulses: Normal pulses.     Heart sounds: Normal heart sounds. No murmur heard.   No friction rub. No gallop.  Pulmonary:     Effort: Pulmonary effort is normal. No respiratory distress.  Breath sounds: Normal breath sounds. No stridor. No wheezing, rhonchi or rales.  Chest:     Chest wall: No tenderness.  Musculoskeletal:        General: Normal range of motion.     Cervical back: Normal range of motion and neck supple.  Skin:    General: Skin is warm and dry.     Capillary Refill: Capillary refill takes less than 2 seconds.     Coloration: Skin is not jaundiced or pale.     Findings: No bruising, erythema, lesion or rash.  Neurological:     General: No focal deficit present.     Mental Status: She is alert and oriented to person, place, and time. Mental  status is at baseline.  Psychiatric:        Mood and Affect: Mood normal.        Behavior: Behavior normal.        Thought Content: Thought content normal.        Judgment: Judgment normal.    Results for orders placed or performed in visit on 03/04/21  Cytology - PAP  Result Value Ref Range   High risk HPV Negative    Adequacy      Satisfactory for evaluation; transformation zone component PRESENT.   Diagnosis      - Negative for intraepithelial lesion or malignancy (NILM)   Comment Normal Reference Range HPV - Negative       Assessment & Plan:   Problem List Items Addressed This Visit       Nervous and Auditory   Cervical radiculopathy - Primary    No better. Will start gabapentin at bed time. Encouraged her to go to PT. Referral to neurosurgery placed today.        Relevant Medications   gabapentin (NEURONTIN) 100 MG capsule   Other Relevant Orders   Ambulatory referral to Neurosurgery   Other Visit Diagnoses     Intertrigo       Not improving- in fold, will change to nystatin powder   Allergic dermatitis due to poison ivy       Will treat with triamcinalone cream. Call with any concerns.         Follow up plan: Return in about 6 weeks (around 05/16/2021).

## 2021-04-04 NOTE — Assessment & Plan Note (Signed)
No better. Will start gabapentin at bed time. Encouraged her to go to PT. Referral to neurosurgery placed today.

## 2021-04-26 ENCOUNTER — Other Ambulatory Visit: Payer: Self-pay | Admitting: Family Medicine

## 2021-04-26 DIAGNOSIS — E038 Other specified hypothyroidism: Secondary | ICD-10-CM

## 2021-04-29 ENCOUNTER — Other Ambulatory Visit: Payer: Self-pay | Admitting: Family Medicine

## 2021-04-29 DIAGNOSIS — E038 Other specified hypothyroidism: Secondary | ICD-10-CM

## 2021-04-29 MED ORDER — LEVOTHYROXINE SODIUM 137 MCG PO TABS: 137.0000 ug | ORAL_TABLET | Freq: Every day | ORAL | 0 refills | Status: DC

## 2021-04-29 NOTE — Telephone Encounter (Signed)
Patient states pharmacy gave her 3 pills and now she is out. Patient states request was sent on 7/8 and again today by the pharmacy

## 2021-04-29 NOTE — Telephone Encounter (Signed)
Please advise 

## 2021-05-14 DIAGNOSIS — R200 Anesthesia of skin: Secondary | ICD-10-CM | POA: Diagnosis not present

## 2021-05-16 ENCOUNTER — Ambulatory Visit: Payer: BC Managed Care – PPO | Admitting: Gastroenterology

## 2021-05-16 ENCOUNTER — Other Ambulatory Visit: Payer: Self-pay

## 2021-05-16 VITALS — BP 152/85 | HR 70 | Temp 98.6°F | Ht 64.0 in | Wt 220.8 lb

## 2021-05-16 DIAGNOSIS — K625 Hemorrhage of anus and rectum: Secondary | ICD-10-CM

## 2021-05-16 MED ORDER — NA SULFATE-K SULFATE-MG SULF 17.5-3.13-1.6 GM/177ML PO SOLN: 1.0000 | Freq: Once | ORAL | 0 refills | Status: AC

## 2021-05-16 NOTE — Progress Notes (Signed)
Emily [name] [address]  [place], [place] [address]  Main: [phone]  Fax: [phone]   Gastroenterology Consultation  Referring Provider:     Valerie Roys, DO Primary Care Physician:  Valerie Roys, DO Reason for Consultation:     Bright red blood per rectum        HPI:    Chief Complaint  Patient presents with   New Patient (Initial Visit)    Denies N/V/D or constipation... Denies family Hx of colon cancer or polyps, no personal Hx   Blood In Stools    Noticed when she wiped, pink, not in toilet or in stools... denies rectal pain, some itching at times...    Emily [name] is a 60 y.o. y/o female referred for consultation & management  by Dr. Wynetta Emery, Megan P, DO.  Patient reports 59-monthhistory of bright red blood per rectum both on tissue paper and within the stool itself.  Mostly occurs on tissue paper only, and almost daily.  No weight loss.  No nausea or vomiting.  No abdominal pain.  No melena.  Denies any NSAID use.  Patient reports history of a colonoscopy years ago.  2010 colonoscopy report available in probation was reviewed and was done by Dr. OCandace Cruisefor chronic diarrhea and showed a good prep.  Extent of exam was cecum.  Colonoscopy was reported to be normal including evaluation of the terminal ileum  PCP notes from April 2022 reviewed and mention that patient has had hematochezia and they questioned it being from hemorrhoids and placed GI referral since patient is also due for colonoscopy.  Past Medical History:  Diagnosis Date   Anxiety    Back pain    Chest pain    a. 10/2013 Neg ETT; b. 10/2015 Refused MV->Cath: nl cors, nl EF.   Degenerative disc disease, lumbar    Depression    Essential hypertension    Fatigue    GERD (gastroesophageal reflux disease)    Hypothyroidism    Joint pain in fingers of both hands    Morbid obesity ([facility]    Myalgia     Past Surgical History:  Procedure Laterality Date   CARDIAC  CATHETERIZATION N/A 11/14/2015   Procedure: Left Heart Cath and Coronary Angiography;  Surgeon: MWellington Hampshire MD;  [facility];  Service: Cardiovascular;  Laterality: N/A;   CARPAL TUNNEL RELEASE Left 07/03/2015   Procedure: CARPAL TUNNEL RELEASE;  Surgeon: CChristophe Louis MD;  Location: [facility];  Service: Orthopedics;  Laterality: Left;   CARPAL TUNNEL RELEASE Right 09/07/2015   Procedure: CARPAL TUNNEL RELEASE;  Surgeon: CChristophe Louis MD;  Location: [facility];  Service: Orthopedics;  Laterality: Right;   CHOLECYSTECTOMY     KNEE SURGERY Right    TUBAL LIGATION      Prior to Admission medications   Medication Sig Start Date End Date Taking? Authorizing Provider  cyclobenzaprine (FLEXERIL) 10 MG tablet Take 1 tablet (10 mg total) by mouth 3 (three) times daily as needed. 02/25/21  Yes McElwee, Lauren A, NP  fluticasone (CUTIVATE) 0.05 % cream as needed.   Yes [provider]  fluticasone (FLONASE) 50 MCG/ACT nasal spray Place 2 sprays into both nostrils daily. 01/21/21  Yes Johnson, Megan P, DO  gabapentin (NEURONTIN) 100 MG capsule Take 1 capsule (100 mg total) by mouth at bedtime. 04/04/21  Yes Johnson, Megan P, DO  hydrochlorothiazide (HYDRODIURIL) 25 MG tablet Take 1 tablet (25 mg total) by  mouth daily. 01/21/21  Yes Johnson, Megan P, DO  ketoconazole (NIZORAL) 2 % cream daily as needed.   Yes [provider]  levothyroxine (SYNTHROID) 137 MCG tablet Take 1 tablet (137 mcg total) by mouth daily before breakfast. Need office visit 04/29/21  Yes Cannady, Jolene T, NP  losartan (COZAAR) 100 MG tablet Take 1 tablet (100 mg total) by mouth daily. 01/21/21  Yes Johnson, Megan P, DO  meloxicam (MOBIC) 7.5 MG tablet TAKE 1 TABLET DAILY 02/11/21  Yes Johnson, Megan P, DO  Na Sulfate-K Sulfate-Mg Sulf 17.5-3.13-1.6 GM/177ML SOLN Take 1 kit by mouth once for 1 dose. 05/16/21 05/16/21 Yes Antwone Capozzoli, Margretta Sidle B, MD  Naproxen Sodium 220 MG CAPS Take 1 capsule by mouth as  needed.   Yes [provider]  nystatin (MYCOSTATIN/NYSTOP) powder Apply 1 application topically 3 (three) times daily. 04/04/21  Yes Johnson, Megan P, DO  nystatin ointment (MYCOSTATIN) Apply 1 application topically 2 (two) times daily. 02/04/21  Yes McElwee, Lauren A, NP  omeprazole (PRILOSEC) 20 MG capsule Take 20 mg by mouth daily as needed. 01/08/16  Yes [provider]  triamcinolone ointment (KENALOG) 0.5 % Apply 1 application topically 2 (two) times daily. 04/04/21  Yes Johnson, Megan P, DO  venlafaxine XR (EFFEXOR-XR) 150 MG 24 hr capsule Take 1 capsule (150 mg total) by mouth daily with breakfast. To be taken with her 39m for 2281mtotal 01/21/21  Yes Johnson, Megan P, DO  venlafaxine XR (EFFEXOR-XR) 75 MG 24 hr capsule Take 1 capsule (75 mg total) by mouth daily with breakfast. To be taken with her 1509mor 225m60mtal 01/21/21  Yes Johnson, Megan P, DO  Omega-3 Fatty Acids (FISH OIL) 500 MG CAPS Take by mouth. Patient not taking: Reported on 05/16/2021    [provider]    Family History  Problem Relation Age of Onset   Stroke Mother    Heart failure Mother    Heart disease Mother    Hypertension Mother    Thyroid disease Mother    Depression Mother    Anxiety disorder Mother    Obesity Mother    Heart attack Father 57  64ypertension Father    Hyperlipidemia Father    Alzheimer's disease Father    Heart disease Father    Obesity Father    CAD Cousin 53  38   passed   Cancer Paternal Uncle        prostate   Diabetes Maternal Grandmother    Diabetes Paternal Grandmother      Social History   Tobacco Use   Smoking status: Former    Packs/day: 0.25    Years: 10.00    Pack years: 2.50    Types: Cigarettes    Quit date: 07/02/1995    Years since quitting: 25.8   Smokeless tobacco: Never  Vaping Use   Vaping Use: Never used  Substance Use Topics   Alcohol use: No    Alcohol/week: 0.0 standard drinks   Drug use: No    Allergies as of  05/16/2021   (No Known Allergies)    Review of Systems:    All systems reviewed and negative except where noted in HPI.   Physical Exam:  Constitutional: General:   Alert,  Well-developed, well-nourished, pleasant and cooperative in NAD BP (!) 152/85   Pulse 70   Temp 98.6 F (37 C) (Oral)   Ht 5' 4"  (1.626 m)   Wt 220 lb 12.8 oz (100.2 kg)  BMI 37.90 kg/m   Eyes:  Sclera clear, no icterus.   Conjunctiva pink. PERRLA  Ears:  No scars, lesions or masses, Normal auditory acuity. Nose:  No deformity, discharge, or lesions. Mouth:  No deformity or lesions, oropharynx pink & moist.  Neck:  Supple; no masses or thyromegaly.  Respiratory: Normal respiratory effort, Normal percussion  Gastrointestinal:  No bruits.  Soft, non-tender and non-distended without masses, hepatosplenomegaly or hernias noted.  No guarding or rebound tenderness.     Cardiac: No clubbing or edema.  No cyanosis. Normal posterior tibial pedal pulses noted.  Lymphatic:  No significant cervical or axillary adenopathy.  Psych:  Alert and cooperative. Normal mood and affect.  Musculoskeletal:  Normal gait. Head normocephalic, atraumatic. Symmetrical without gross deformities. 5/5 Upper and Lower extremity strength bilaterally.  Skin: Warm. Intact without significant lesions or rashes. No jaundice.  Neurologic:  Face symmetrical, tongue midline, Normal sensation to touch;  grossly normal neurologically.  Psych:  Alert and oriented x3, Alert and cooperative. Normal mood and affect.   Labs: CBC    Component Value Date/Time   WBC 8.1 01/21/2021 1517   WBC 7.5 11/12/2015 1827   RBC 4.62 01/21/2021 1517   RBC 4.44 11/12/2015 1827   HGB 14.2 01/21/2021 1517   HCT 42.7 01/21/2021 1517   PLT 287 01/21/2021 1517   MCV 92 01/21/2021 1517   MCV 94 10/24/2014 0708   MCH 30.7 01/21/2021 1517   MCH 30.9 11/12/2015 1827   MCHC 33.3 01/21/2021 1517   MCHC 33.7 11/12/2015 1827   RDW 12.8 01/21/2021 1517   RDW  13.2 10/24/2014 0708   LYMPHSABS 3.0 01/21/2021 1517   EOSABS 0.2 01/21/2021 1517   BASOSABS 0.1 01/21/2021 1517   CMP     Component Value Date/Time   NA 138 01/21/2021 1517   NA 139 10/24/2014 0708   K 4.1 01/21/2021 1517   K 3.9 10/24/2014 0708   CL 97 01/21/2021 1517   CL 106 10/24/2014 0708   CO2 22 01/21/2021 1517   CO2 27 10/24/2014 0708   GLUCOSE 93 01/21/2021 1517   GLUCOSE 97 11/12/2015 1827   GLUCOSE 108 (H) 10/24/2014 0708   BUN 22 01/21/2021 1517   BUN 21 (H) 10/24/2014 0708   CREATININE 0.68 01/21/2021 1517   CREATININE 0.76 10/24/2014 0708   CALCIUM 10.1 01/21/2021 1517   CALCIUM 8.8 10/24/2014 0708   PROT 7.7 01/21/2021 1517   ALBUMIN 4.6 01/21/2021 1517   AST 24 01/21/2021 1517   ALT 30 01/21/2021 1517   ALKPHOS 79 01/21/2021 1517   BILITOT 0.4 01/21/2021 1517   GFRNONAA 80 02/28/2020 1532   GFRNONAA >60 10/24/2014 0708   GFRAA 93 02/28/2020 1532   GFRAA >60 10/24/2014 0708  Normal TSH 3.6  Imaging Studies: No results found.  Assessment and Plan:   KELIYAH [name] is a 59 y.o. y/o female has been referred for bright red blood per rectum  Patient has not had endoscopic evaluation of her colon in 12 years.  Given daily symptoms of bright red blood per rectum for 6 months, important to rule out malignancy at this time  CBC CMP, TSH recently reviewed from April 2022.  Labs were otherwise reassuring with normal hemoglobin.  Normal liver enzymes as well.  Patient has not had any weight loss.  This is all very reassuring and symptoms may be due to underlying hemorrhoids as well.  However, Patient is not constipated or straining  Other differentials include AVMs, large polyps  Colonoscopy indicated at this time both for screening and due to her above symptoms  I have discussed alternative options, risks & benefits,  which include, but are not limited to, bleeding, infection, perforation,respiratory complication & drug reaction.  The patient agrees with this  plan & written consent will be obtained.    Continue high-fiber diet   Dr Emily [name]  Speech recognition software was used to dictate the above note.

## 2021-05-28 ENCOUNTER — Other Ambulatory Visit: Payer: Self-pay

## 2021-05-28 DIAGNOSIS — E038 Other specified hypothyroidism: Secondary | ICD-10-CM

## 2021-05-28 DIAGNOSIS — I10 Essential (primary) hypertension: Secondary | ICD-10-CM

## 2021-05-28 NOTE — Telephone Encounter (Signed)
Patient is need prescriptions sent to mail order pharmacy please.

## 2021-05-29 ENCOUNTER — Encounter: Payer: Self-pay | Admitting: Gastroenterology

## 2021-05-30 ENCOUNTER — Ambulatory Visit: Payer: BC Managed Care – PPO | Admitting: Anesthesiology

## 2021-05-30 ENCOUNTER — Encounter: Payer: Self-pay | Admitting: Gastroenterology

## 2021-05-30 ENCOUNTER — Ambulatory Visit
Admission: RE | Admit: 2021-05-30 | Discharge: 2021-05-30 | Disposition: A | Discharge status: Home / Self Care | Payer: BC Managed Care – PPO | Attending: Gastroenterology | Admitting: Gastroenterology

## 2021-05-30 ENCOUNTER — Encounter
Admission: RE | Disposition: A | Discharge status: Home / Self Care | Payer: Self-pay | Source: Home / Self Care | Attending: Gastroenterology

## 2021-05-30 DIAGNOSIS — Z833 Family history of diabetes mellitus: Secondary | ICD-10-CM | POA: Diagnosis not present

## 2021-05-30 DIAGNOSIS — Z79899 Other long term (current) drug therapy: Secondary | ICD-10-CM | POA: Insufficient documentation

## 2021-05-30 DIAGNOSIS — Z791 Long term (current) use of non-steroidal anti-inflammatories (NSAID): Secondary | ICD-10-CM | POA: Insufficient documentation

## 2021-05-30 DIAGNOSIS — E039 Hypothyroidism, unspecified: Secondary | ICD-10-CM | POA: Diagnosis not present

## 2021-05-30 DIAGNOSIS — Z87891 Personal history of nicotine dependence: Secondary | ICD-10-CM | POA: Diagnosis not present

## 2021-05-30 DIAGNOSIS — Z8042 Family history of malignant neoplasm of prostate: Secondary | ICD-10-CM | POA: Diagnosis not present

## 2021-05-30 DIAGNOSIS — Z8249 Family history of ischemic heart disease and other diseases of the circulatory system: Secondary | ICD-10-CM | POA: Diagnosis not present

## 2021-05-30 DIAGNOSIS — Z8349 Family history of other endocrine, nutritional and metabolic diseases: Secondary | ICD-10-CM | POA: Diagnosis not present

## 2021-05-30 DIAGNOSIS — Z6837 Body mass index (BMI) 37.0-37.9, adult: Secondary | ICD-10-CM | POA: Diagnosis not present

## 2021-05-30 DIAGNOSIS — K219 Gastro-esophageal reflux disease without esophagitis: Secondary | ICD-10-CM | POA: Insufficient documentation

## 2021-05-30 DIAGNOSIS — K649 Unspecified hemorrhoids: Secondary | ICD-10-CM | POA: Diagnosis not present

## 2021-05-30 DIAGNOSIS — Z823 Family history of stroke: Secondary | ICD-10-CM | POA: Insufficient documentation

## 2021-05-30 DIAGNOSIS — Z1211 Encounter for screening for malignant neoplasm of colon: Secondary | ICD-10-CM | POA: Diagnosis not present

## 2021-05-30 DIAGNOSIS — K648 Other hemorrhoids: Secondary | ICD-10-CM | POA: Insufficient documentation

## 2021-05-30 DIAGNOSIS — K625 Hemorrhage of anus and rectum: Secondary | ICD-10-CM

## 2021-05-30 HISTORY — PX: COLONOSCOPY WITH PROPOFOL: SHX5780

## 2021-05-30 HISTORY — DX: Cervicalgia: M54.2

## 2021-05-30 SURGERY — COLONOSCOPY WITH PROPOFOL
Anesthesia: General

## 2021-05-30 MED ORDER — DEXMEDETOMIDINE (PRECEDEX) IN NS 20 MCG/5ML (4 MCG/ML) IV SYRINGE
PREFILLED_SYRINGE | INTRAVENOUS | Status: DC | PRN
  Administered 2021-05-30 (×2): 8 ug via INTRAVENOUS

## 2021-05-30 MED ORDER — PROPOFOL 500 MG/50ML IV EMUL
INTRAVENOUS | Status: DC | PRN
  Administered 2021-05-30: 140 ug/kg/min via INTRAVENOUS

## 2021-05-30 MED ORDER — SODIUM CHLORIDE 0.9 % IV SOLN: INTRAVENOUS | Status: DC

## 2021-05-30 MED ORDER — PHENYLEPHRINE HCL (PRESSORS) 10 MG/ML IV SOLN
INTRAVENOUS | Status: DC | PRN
  Administered 2021-05-30: 100 ug via INTRAVENOUS

## 2021-05-30 MED ORDER — PROPOFOL 10 MG/ML IV BOLUS
INTRAVENOUS | Status: DC | PRN
  Administered 2021-05-30: 40 mg via INTRAVENOUS
  Administered 2021-05-30: 30 mg via INTRAVENOUS
  Administered 2021-05-30: 70 mg via INTRAVENOUS
  Administered 2021-05-30: 30 mg via INTRAVENOUS

## 2021-05-30 NOTE — Transfer of Care (Signed)
Immediate Anesthesia Transfer of Care Note  Patient: Emily [name]  Procedure(s) Performed: COLONOSCOPY WITH PROPOFOL  Patient Location: PACU  Anesthesia Type:General  Level of Consciousness: awake, alert  and oriented  Airway & Oxygen Therapy: Patient Spontanous Breathing  Post-op Assessment: Report given to RN and Post -op Vital signs reviewed and stable  Post vital signs: Reviewed and stable  Last Vitals:  Vitals Value Taken Time  BP    Temp    Pulse 73 05/30/21 0822  Resp 14 05/30/21 0822  SpO2 95 % 05/30/21 0822  Vitals shown include unvalidated device data.  Last Pain:  Vitals:   05/30/21 0705  TempSrc: Temporal  PainSc: 0-No pain         Complications: No notable events documented.

## 2021-05-30 NOTE — Op Note (Signed)
[facility] Gastroenterology Patient Name: Emily [name] Procedure Date: 05/30/2021 7:39 AM MRN: [id number] Account #: [id number] Date of Birth: 05-06-1961 Admit Type: Outpatient Age: [age] [facility] ROOM 4 Gender: Female Note Status: Finalized Procedure:             Colonoscopy Indications:           Screening for colorectal malignant neoplasm Providers:             Claron Rosencrans B. Maximino Greenlandahiliani MD, MD Referring MD:          Dorcas CarrowMegan P. Johnson (Referring MD) Medicines:             Monitored Anesthesia Care Complications:         No immediate complications. Procedure:             Pre-Anesthesia Assessment:                        - Prior to the procedure, a History and Physical was                         performed, and patient medications, allergies and                         sensitivities were reviewed. The patient's tolerance                         of previous anesthesia was reviewed.                        - The risks and benefits of the procedure and the                         sedation options and risks were discussed with the                         patient. All questions were answered and informed                         consent was obtained.                        - Patient identification and proposed procedure were                         verified prior to the procedure by the physician, the                         nurse, the anesthetist and the technician. The                         procedure was verified in the pre-procedure area in                         the procedure room in the endoscopy suite.                        - ASA Grade Assessment: II - A patient with mild  systemic disease.                        - After reviewing the risks and benefits, the patient                         was deemed in satisfactory condition to undergo the                         procedure.                        After obtaining informed consent, the  colonoscope was                         passed under direct vision. Throughout the procedure,                         the patient's blood pressure, pulse, and oxygen                         saturations were monitored continuously. The                         Colonoscope was introduced through the anus and                         advanced to the the cecum, identified by appendiceal                         orifice and ileocecal valve. The colonoscopy was                         performed with ease. The patient tolerated the                         procedure well. The quality of the bowel preparation                         was good. Findings:      The perianal and digital rectal examinations were normal.      The rectum, sigmoid colon, descending colon, transverse colon, ascending       colon and cecum appeared normal.      Non-bleeding internal hemorrhoids were found during retroflexion. Impression:            - The rectum, sigmoid colon, descending colon,                         transverse colon, ascending colon and cecum are normal.                        - Non-bleeding internal hemorrhoids.                        - No specimens collected. Recommendation:        - Discharge patient to home.                        - High fiber diet.                        -  Repeat colonoscopy in 10 years for screening                         purposes.                        - Continue present medications.                        - Return to primary care physician as previously                         scheduled.                        - The findings and recommendations were discussed with                         the patient.                        - The findings and recommendations were discussed with                         the patient's family.                        - In the future, if patient develops new symptoms such                         as blood per rectum, abdominal pain, weight loss,                          altered bowel habits or any other reason for concern,                         patient should discuss this with thier PCP as they may                         need a GI referral at that time or evaluation for need                         for colonoscopy earlier than the recommended screening                         colonoscopy.                        In addition, if patient's family history of colon                         cancer changes (no family history at this time) in the                         future, earlier screening may be indicated and patient                         should discuss this with PCP as well. Procedure Code(s):     --- Professional ---  72094, Colonoscopy, flexible; diagnostic, including                         collection of specimen(s) by brushing or washing, when                         performed (separate procedure) Diagnosis Code(s):     --- Professional ---                        Z12.11, Encounter for screening for malignant neoplasm                         of colon CPT copyright 2019 American Medical Association. All rights reserved. The codes documented in this report are preliminary and upon coder review may  be revised to meet current compliance requirements.  Melodie Bouillon, MD Michel Bickers B. Maximino Greenland MD, MD 05/30/2021 8:39:39 AM This report has been signed electronically. Number of Addenda: 0 Note Initiated On: 05/30/2021 7:39 AM Scope Withdrawal Time: 0 hours 27 minutes 19 seconds  Total Procedure Duration: 0 hours 32 minutes 23 seconds       [facility]

## 2021-05-30 NOTE — H&P (Signed)
Emily Bouillon, MD [address], [address], [place], [place], [address] [address], [address], [place], [place], [address] Phone: [phone]  Fax: [phone]  Primary Care Physician:  Emily Carrow, DO   Pre-Procedure History & Physical: HPI:  Emily [name] is a 60 y.o. female is here for a colonoscopy.   Past Medical History:  Diagnosis Date   Anxiety    Back pain    Chest pain    a. 10/2013 Neg ETT; b. 10/2015 Refused MV->Cath: nl cors, nl EF.   Degenerative disc disease, lumbar    Depression    Essential hypertension    Fatigue    GERD (gastroesophageal reflux disease)    Hypothyroidism    Joint pain in fingers of both hands    Morbid obesity (HCC)    Myalgia    Neck pain     Past Surgical History:  Procedure Laterality Date   CARDIAC CATHETERIZATION N/A 11/14/2015   Procedure: Left Heart Cath and Coronary Angiography;  Surgeon: Emily Ouch, MD;  [facility];  Service: Cardiovascular;  Laterality: N/A;   CARPAL TUNNEL RELEASE Left 07/03/2015   Procedure: CARPAL TUNNEL RELEASE;  Surgeon: Myra Rude, MD;  Location: [facility];  Service: Orthopedics;  Laterality: Left;   CARPAL TUNNEL RELEASE Right 09/07/2015   Procedure: CARPAL TUNNEL RELEASE;  Surgeon: Myra Rude, MD;  Location: [facility];  Service: Orthopedics;  Laterality: Right;   CHOLECYSTECTOMY     KNEE SURGERY Right    TUBAL LIGATION      Prior to Admission medications   Medication Sig Start Date End Date Taking? Authorizing Provider  levothyroxine (SYNTHROID) 137 MCG tablet Take 1 tablet (137 mcg total) by mouth daily before breakfast. Need office visit 04/29/21  Yes Emily [name], Emily T, NP  losartan (COZAAR) 100 MG tablet Take 1 tablet (100 mg total) by mouth daily. 01/21/21  Yes [name], Emily P, DO  meloxicam (MOBIC) 7.5 MG tablet TAKE 1 TABLET DAILY 02/11/21  Yes [name], Emily P, DO  triamcinolone ointment (KENALOG) 0.5 % Apply 1 application topically 2 (two) times  daily. 04/04/21  Yes [name], Emily P, DO  venlafaxine XR (EFFEXOR-XR) 150 MG 24 hr capsule Take 1 capsule (150 mg total) by mouth daily with breakfast. To be taken with her 75mg  for 225mg  total 01/21/21  Yes [name], Emily P, DO  venlafaxine XR (EFFEXOR-XR) 75 MG 24 hr capsule Take 1 capsule (75 mg total) by mouth daily with breakfast. To be taken with her 150mg  for 225mg  total 01/21/21  Yes [name], Emily P, DO  cyclobenzaprine (FLEXERIL) 10 MG tablet Take 1 tablet (10 mg total) by mouth 3 (three) times daily as needed. Patient not taking: Reported on 05/30/2021 02/25/21   McElwee, A, NP  fluticasone (CUTIVATE) 0.05 % cream as needed.    [provider]  fluticasone (FLONASE) 50 MCG/ACT nasal spray Place 2 sprays into both nostrils daily. Patient not taking: Reported on 05/30/2021 01/21/21   04/27/21 P, DO  gabapentin (NEURONTIN) 100 MG capsule Take 1 capsule (100 mg total) by mouth at bedtime. Patient not taking: Reported on 05/30/2021 04/04/21   03/23/21 P, DO  hydrochlorothiazide (HYDRODIURIL) 25 MG tablet Take 1 tablet (25 mg total) by mouth daily. 01/21/21   [name], Emily P, DO  ketoconazole (NIZORAL) 2 % cream daily as needed.    [provider]  Naproxen Sodium 220 MG CAPS Take 1 capsule by mouth as needed. Patient not taking: Reported on 05/30/2021  [provider]  nystatin (MYCOSTATIN/NYSTOP) powder Apply 1 application topically 3 (three) times daily. 04/04/21   Olevia Perches P, DO  nystatin ointment (MYCOSTATIN) Apply 1 application topically 2 (two) times daily. 02/04/21   McElwee, Emily Church, NP  Omega-3 Fatty Acids (FISH OIL) 500 MG CAPS Take by mouth. Patient not taking: Reported on 05/16/2021    [provider]  omeprazole (PRILOSEC) 20 MG capsule Take 20 mg by mouth daily as needed. 01/08/16   [provider]    Allergies as of 05/17/2021   (No Known Allergies)    Family History  Problem Relation Age of Onset   Stroke Mother     Heart failure Mother    Heart disease Mother    Hypertension Mother    Thyroid disease Mother    Depression Mother    Anxiety disorder Mother    Obesity Mother    Heart attack Father 24   Hypertension Father    Hyperlipidemia Father    Alzheimer's disease Father    Heart disease Father    Obesity Father    CAD Cousin 25       passed   Cancer Paternal Uncle        prostate   Diabetes Maternal Grandmother    Diabetes Paternal Grandmother     Social History   Socioeconomic History   Marital status: Married    Spouse name: Emily [name]   Number of children: Not on file   Years of education: Not on file   Highest education level: Not on file  Occupational History   Occupation: retired from [employer] 30 yrs of service  Tobacco Use   Smoking status: Former    Packs/day: 0.25    Years: 10.00    Pack years: 2.50    Types: Cigarettes    Quit date: 07/02/1995    Years since quitting: 25.9   Smokeless tobacco: Never  Vaping Use   Vaping Use: Never used  Substance and Sexual Activity   Alcohol use: No    Alcohol/week: 0.0 standard drinks   Drug use: No   Sexual activity: Yes    Partners: Male  Other Topics Concern   Not on file  Social History Narrative   Not on file   Social Determinants of Health   Financial Resource Strain: Not on file  Food Insecurity: Not on file  Transportation Needs: Not on file  Physical Activity: Not on file  Stress: Not on file  Social Connections: Not on file  Intimate Partner Violence: Not on file    Review of Systems: See HPI, otherwise negative ROS  Physical Exam: Constitutional: General:   Alert,  Well-developed, well-nourished, pleasant and cooperative in NAD BP (!) 164/69   Pulse 79   Temp 98.2 F (36.8 C) (Temporal)   Resp 18   Ht 5\' 4"  (1.626 m)   Wt 99.8 kg   SpO2 100%   BMI 37.76 kg/m   Head: Normocephalic, atraumatic.   Eyes:  Sclera clear, no icterus.   Conjunctiva pink.   Mouth:  No deformity or lesions,  oropharynx pink & moist.  Neck:  Supple, trachea midline  Respiratory: Normal respiratory effort  Gastrointestinal:  Soft, non-tender and non-distended without masses, hepatosplenomegaly or hernias noted.  No guarding or rebound tenderness.     Cardiac: No clubbing or edema.  No cyanosis. Normal posterior tibial pedal pulses noted.  Lymphatic:  No significant cervical adenopathy.  Psych:  Alert and cooperative. Normal mood and affect.  Musculoskeletal:   Symmetrical without gross deformities. 5/5 Lower extremity strength bilaterally.  Skin: Warm. Intact without significant lesions or rashes. No jaundice.  Neurologic:  Face symmetrical, tongue midline, Normal sensation to touch;  grossly normal neurologically.  Psych:  Alert and oriented x3, Alert and cooperative. Normal mood and affect.  Impression/Plan: CHERRE KOTHARI is here for a colonoscopy to be performed for average risk screening.  Risks, benefits, limitations, and alternatives regarding  colonoscopy have been reviewed with the patient.  Questions have been answered.  All parties agreeable.   Pasty Spillers, MD  05/30/2021, 7:38 AM

## 2021-05-30 NOTE — Anesthesia Preprocedure Evaluation (Signed)
Anesthesia Evaluation  Patient identified by MRN, date of birth, ID band Patient awake    Reviewed: Allergy & Precautions, NPO status , Patient's Chart, lab work & pertinent test results  History of Anesthesia Complications Negative for: history of anesthetic complications  Airway Mallampati: III  TM Distance: <3 FB Neck ROM: full    Dental  (+) Chipped   Pulmonary neg shortness of breath, former smoker,    Pulmonary exam normal        Cardiovascular Exercise Tolerance: Good hypertension, (-) angina(-) Past MI Normal cardiovascular exam     Neuro/Psych PSYCHIATRIC DISORDERS  Neuromuscular disease negative psych ROS   GI/Hepatic Neg liver ROS, GERD  Medicated and Controlled,  Endo/Other  Hypothyroidism Morbid obesity  Renal/GU negative Renal ROS  negative genitourinary   Musculoskeletal  (+) Arthritis ,   Abdominal   Peds  Hematology negative hematology ROS (+)   Anesthesia Other Findings Past Medical History: No date: Anxiety No date: Back pain No date: Chest pain     Comment:  a. 10/2013 Neg ETT; b. 10/2015 Refused MV->Cath: nl cors,               nl EF. No date: Degenerative disc disease, lumbar No date: Depression No date: Essential hypertension No date: Fatigue No date: GERD (gastroesophageal reflux disease) No date: Hypothyroidism No date: Joint pain in fingers of both hands No date: Morbid obesity (HCC) No date: Myalgia No date: Neck pain  Past Surgical History: 11/14/2015: CARDIAC CATHETERIZATION; N/A     Comment:  Procedure: Left Heart Cath and Coronary Angiography;                Surgeon: Iran Ouch, MD;  Location: [facility]               [facility];  Service: Cardiovascular;  Laterality: N/A; 07/03/2015: CARPAL TUNNEL RELEASE; Left     Comment:  Procedure: CARPAL TUNNEL RELEASE;  Surgeon: Myra Rude, MD;  Location: [facility];  Service: Orthopedics;                 Laterality: Left; 09/07/2015: CARPAL TUNNEL RELEASE; Right     Comment:  Procedure: CARPAL TUNNEL RELEASE;  Surgeon: Myra Rude, MD;  Location: [facility] OR[facility];  Service: Orthopedics;                Laterality: Right; No date: CHOLECYSTECTOMY No date: KNEE SURGERY; Right No date: TUBAL LIGATION  BMI    Body Mass Index: 37.76 kg/m      Reproductive/Obstetrics negative OB ROS                             Anesthesia Physical Anesthesia Plan  ASA: 3  Anesthesia Plan: General   Post-op Pain Management:    Induction: Intravenous  PONV Risk Score and Plan: Propofol infusion and TIVA  Airway Management Planned: Natural Airway and Nasal Cannula  Additional Equipment:   Intra-op Plan:   Post-operative Plan:   Informed Consent: I have reviewed the patients History and Physical, chart, labs and discussed the procedure including the risks, benefits and alternatives for the proposed anesthesia with the patient or authorized representative who has indicated his/her understanding and acceptance.     Dental Advisory Given  Plan Discussed with: Anesthesiologist, CRNA and Surgeon  Anesthesia Plan Comments: (Patient consented for risks of anesthesia including but not limited to:  - adverse reactions to medications - risk of airway placement if required - damage to eyes, teeth, lips or other oral mucosa - nerve damage due to positioning  - sore throat or hoarseness - Damage to heart, brain, nerves, lungs, other parts of body or loss of life  Patient voiced understanding.)        Anesthesia Quick Evaluation

## 2021-05-30 NOTE — Anesthesia Postprocedure Evaluation (Signed)
Anesthesia Post Note  Patient: Emily [name]  Procedure(s) Performed: COLONOSCOPY WITH PROPOFOL  Patient location during evaluation: Endoscopy Anesthesia Type: General Level of consciousness: awake and alert Pain management: pain level controlled Vital Signs Assessment: post-procedure vital signs reviewed and stable Respiratory status: spontaneous breathing, nonlabored ventilation, respiratory function stable and patient connected to nasal cannula oxygen Cardiovascular status: blood pressure returned to baseline and stable Postop Assessment: no apparent nausea or vomiting Anesthetic complications: no   No notable events documented.   Last Vitals:  Vitals:   05/30/21 0842 05/30/21 0852  BP: 126/63 130/60  Pulse: 66 64  Resp: 17 14  Temp:    SpO2: 95% 95%    Last Pain:  Vitals:   05/30/21 0852  TempSrc:   PainSc: 0-No pain                 Cleda Mccreedy Maikol Grassia

## 2021-05-31 ENCOUNTER — Encounter: Payer: Self-pay | Admitting: Gastroenterology

## 2021-06-03 ENCOUNTER — Other Ambulatory Visit: Payer: Self-pay

## 2021-06-03 DIAGNOSIS — I10 Essential (primary) hypertension: Secondary | ICD-10-CM

## 2021-06-03 DIAGNOSIS — E038 Other specified hypothyroidism: Secondary | ICD-10-CM

## 2021-06-03 MED ORDER — VENLAFAXINE HCL ER 75 MG PO CP24: 75.0000 mg | ORAL_CAPSULE | Freq: Every day | ORAL | 1 refills | Status: DC

## 2021-06-03 MED ORDER — HYDROCHLOROTHIAZIDE 25 MG PO TABS: 25.0000 mg | ORAL_TABLET | Freq: Every day | ORAL | 1 refills | Status: DC

## 2021-06-03 MED ORDER — LEVOTHYROXINE SODIUM 137 MCG PO TABS: 137.0000 ug | ORAL_TABLET | Freq: Every day | ORAL | 0 refills | Status: DC

## 2021-06-03 MED ORDER — VENLAFAXINE HCL ER 150 MG PO CP24: 150.0000 mg | ORAL_CAPSULE | Freq: Every day | ORAL | 1 refills | Status: DC

## 2021-06-03 MED ORDER — LOSARTAN POTASSIUM 100 MG PO TABS: 100.0000 mg | ORAL_TABLET | Freq: Every day | ORAL | 1 refills | Status: DC

## 2021-06-03 NOTE — Telephone Encounter (Signed)
Patient has switched to mail order and needs RX's sent there

## 2021-08-20 ENCOUNTER — Ambulatory Visit: Payer: BC Managed Care – PPO | Admitting: Gastroenterology

## 2021-08-23 ENCOUNTER — Telehealth: Payer: BC Managed Care – PPO | Admitting: Nurse Practitioner

## 2021-08-23 ENCOUNTER — Ambulatory Visit: Payer: Self-pay

## 2021-08-23 DIAGNOSIS — J0100 Acute maxillary sinusitis, unspecified: Secondary | ICD-10-CM | POA: Diagnosis not present

## 2021-08-23 MED ORDER — AMOXICILLIN-POT CLAVULANATE 875-125 MG PO TABS: 1.0000 | ORAL_TABLET | Freq: Two times a day (BID) | ORAL | 0 refills | Status: DC

## 2021-08-23 NOTE — Telephone Encounter (Signed)
Pt husband requests that pt get a call back from a nurse due to pt being concerned that she possibly has a respiratory infection. Pt has virtual appt scheduled for Monday but pt husband stated pt would like for a nurse to call her back. Cb# [phone]   Pt. Reports she has had a cough x 1 month. Non-productive. Feels achy today and has a headache. Mild shortness of breath with coughing spells.No availability in the practice today. Pt. Will do My Chart e-visit.    Reason for Disposition  [1] MILD difficulty breathing (e.g., minimal/no SOB at rest, SOB with walking, pulse <100) AND [2] still present when not coughing  Answer Assessment - Initial Assessment Questions 1. ONSET: "When did the cough begin?"      1 month ago 2. SEVERITY: "How bad is the cough today?"      Severe 3. SPUTUM: "Describe the color of your sputum" (none, dry cough; clear, white, yellow, green)     None 4. HEMOPTYSIS: "Are you coughing up any blood?" If so ask: "How much?" (flecks, streaks, tablespoons, etc.)     No 5. DIFFICULTY BREATHING: "Are you having difficulty breathing?" If Yes, ask: "How bad is it?" (e.g., mild, moderate, severe)    - MILD: No SOB at rest, mild SOB with walking, speaks normally in sentences, can lie down, no retractions, pulse < 100.    - MODERATE: SOB at rest, SOB with minimal exertion and prefers to sit, cannot lie down flat, speaks in phrases, mild retractions, audible wheezing, pulse 100-120.    - SEVERE: Very SOB at rest, speaks in single words, struggling to breathe, sitting hunched forward, retractions, pulse > 120      Mild 6. FEVER: "Do you have a fever?" If Yes, ask: "What is your temperature, how was it measured, and when did it start?"     No 7. CARDIAC HISTORY: "Do you have any history of heart disease?" (e.g., heart attack, congestive heart failure)      No 8. LUNG HISTORY: "Do you have any history of lung disease?"  (e.g., pulmonary embolus, asthma, emphysema)     No 9. PE  RISK FACTORS: "Do you have a history of blood clots?" (or: recent major surgery, recent prolonged travel, bedridden)     No 10. OTHER SYMPTOMS: "Do you have any other symptoms?" (e.g., runny nose, wheezing, chest pain)       Runny nose 11. PREGNANCY: "Is there any chance you are pregnant?" "When was your last menstrual period?"       No 12. TRAVEL: "Have you traveled out of the country in the last month?" (e.g., travel history, exposures)       No  Protocols used: Cough - Acute Non-Productive-A-AH

## 2021-08-23 NOTE — Progress Notes (Signed)

## 2021-08-23 NOTE — Addendum Note (Signed)
Addended by: Bennie Pierini on: 08/23/2021 02:17 PM   Modules accepted: Orders

## 2021-08-26 ENCOUNTER — Telehealth: Payer: BC Managed Care – PPO | Admitting: Nurse Practitioner

## 2021-08-26 NOTE — Progress Notes (Deleted)
   There were no vitals taken for this visit.   Subjective:    Patient ID: Emily [name], female    DOB: 23-Nov-1960, 60 y.o.   MRN: [id number]  HPI: Emily [name] is a 60 y.o. female  No chief complaint on file.  UPPER RESPIRATORY TRACT INFECTION Worst symptom: Fever: {Blank single:19197::"yes","no"} Cough: {Blank single:19197::"yes","no"} Shortness of breath: {Blank single:19197::"yes","no"} Wheezing: {Blank single:19197::"yes","no"} Chest pain: {Blank single:19197::"yes","no","yes, with cough"} Chest tightness: {Blank single:19197::"yes","no"} Chest congestion: {Blank single:19197::"yes","no"} Nasal congestion: {Blank single:19197::"yes","no"} Runny nose: {Blank single:19197::"yes","no"} Post nasal drip: {Blank single:19197::"yes","no"} Sneezing: {Blank single:19197::"yes","no"} Sore throat: {Blank single:19197::"yes","no"} Swollen glands: {Blank single:19197::"yes","no"} Sinus pressure: {Blank single:19197::"yes","no"} Headache: {Blank single:19197::"yes","no"} Face pain: {Blank single:19197::"yes","no"} Toothache: {Blank single:19197::"yes","no"} Ear pain: {Blank single:19197::"yes","no"} {Blank single:19197::""right","left", "bilateral"} Ear pressure: {Blank single:19197::"yes","no"} {Blank single:19197::""right","left", "bilateral"} Eyes red/itching:{Blank single:19197::"yes","no"} Eye drainage/crusting: {Blank single:19197::"yes","no"}  Vomiting: {Blank single:19197::"yes","no"} Rash: {Blank single:19197::"yes","no"} Fatigue: {Blank single:19197::"yes","no"} Sick contacts: {Blank single:19197::"yes","no"} Strep contacts: {Blank single:19197::"yes","no"}  Context: {Blank multiple:19196::"better","worse","stable","fluctuating"} Recurrent sinusitis: {Blank single:19197::"yes","no"} Relief with OTC cold/cough medications: {Blank single:19197::"yes","no"}  Treatments attempted: {Blank multiple:19196::"none","cold/sinus","mucinex","anti-histamine","pseudoephedrine","cough  syrup","antibiotics"}   Relevant past medical, surgical, family and social history reviewed and updated as indicated. Interim medical history since our last visit reviewed. Allergies and medications reviewed and updated.  Review of Systems  Per HPI unless specifically indicated above     Objective:    There were no vitals taken for this visit.  Wt Readings from Last 3 Encounters:  05/30/21 220 lb (99.8 kg)  05/16/21 220 lb 12.8 oz (100.2 kg)  04/04/21 218 lb 12.8 oz (99.2 kg)    Physical Exam  Results for orders placed or performed in visit on 03/04/21  Cytology - PAP  Result Value Ref Range   High risk HPV Negative    Adequacy      Satisfactory for evaluation; transformation zone component PRESENT.   Diagnosis      - Negative for intraepithelial lesion or malignancy (NILM)   Comment Normal Reference Range HPV - Negative       Assessment & Plan:   Problem List Items Addressed This Visit   None    Follow up plan: No follow-ups on file.   This visit was completed via MyChart due to the restrictions of the COVID-19 pandemic. All issues as above were discussed and addressed. Physical exam was done as above through visual confirmation on MyChart. If it was felt that the patient should be evaluated in the office, they were directed there. The patient verbally consented to this visit. Location of the patient: *** Location of the provider: Office Those involved with this call:  Provider: Larae Grooms, NP CMA: *** Front Desk/Registration: *** This encounter was conducted via ***.  I spent *** dedicated to the care of this patient on the date of this encounter to include previsit review of ***, face to face time with the patient, and post visit ordering of testing.

## 2021-08-27 DIAGNOSIS — M545 Low back pain, unspecified: Secondary | ICD-10-CM | POA: Diagnosis not present

## 2021-08-27 DIAGNOSIS — M5416 Radiculopathy, lumbar region: Secondary | ICD-10-CM | POA: Diagnosis not present

## 2021-08-29 DIAGNOSIS — M545 Low back pain, unspecified: Secondary | ICD-10-CM | POA: Insufficient documentation

## 2021-09-09 DIAGNOSIS — Z0289 Encounter for other administrative examinations: Secondary | ICD-10-CM

## 2021-09-17 ENCOUNTER — Other Ambulatory Visit: Payer: Self-pay | Admitting: Family Medicine

## 2021-09-17 DIAGNOSIS — E038 Other specified hypothyroidism: Secondary | ICD-10-CM

## 2021-09-17 NOTE — Telephone Encounter (Signed)
Needs an appointment. Will get her enough medicine to make it to appointment when it's booked.   

## 2021-09-27 ENCOUNTER — Ambulatory Visit: Payer: BC Managed Care – PPO | Admitting: Family Medicine

## 2021-10-01 ENCOUNTER — Telehealth (INDEPENDENT_AMBULATORY_CARE_PROVIDER_SITE_OTHER): Payer: BC Managed Care – PPO | Admitting: Family Medicine

## 2021-10-01 ENCOUNTER — Encounter: Payer: Self-pay | Admitting: Family Medicine

## 2021-10-01 DIAGNOSIS — Z20828 Contact with and (suspected) exposure to other viral communicable diseases: Secondary | ICD-10-CM

## 2021-10-01 DIAGNOSIS — E038 Other specified hypothyroidism: Secondary | ICD-10-CM | POA: Diagnosis not present

## 2021-10-01 MED ORDER — OSELTAMIVIR PHOSPHATE 75 MG PO CAPS: 75.0000 mg | ORAL_CAPSULE | Freq: Two times a day (BID) | ORAL | 0 refills | Status: DC

## 2021-10-01 MED ORDER — LEVOTHYROXINE SODIUM 137 MCG PO TABS: 137.0000 ug | ORAL_TABLET | Freq: Every day | ORAL | 0 refills | Status: DC

## 2021-10-01 NOTE — Progress Notes (Signed)
There were no vitals taken for this visit.   Subjective:    Patient ID: Emily [name], female    DOB: 08/04/1961, 60 y.o.   MRN: [id number]  HPI: Emily [name] is a 60 y.o. female  Chief Complaint  Patient presents with   Fever    Patient states she is having fever, body aches, runny nose and coughing. Patient symptoms began Sunday night.   UPPER RESPIRATORY TRACT INFECTION- husband tested positive for the flu Duration: 3 days Worst symptom: fever, body aches Fever: subjective Cough: yes Shortness of breath: no Wheezing: no Chest pain: no Chest tightness: yes Chest congestion: no Nasal congestion: yes Runny nose: yes Post nasal drip: no Sneezing: no Sore throat: no Swollen glands: no Sinus pressure: no Headache: yes Face pain: no Toothache: no Ear pain: no  Ear pressure: no  Eyes red/itching:no Eye drainage/crusting: no  Vomiting: no Rash: no Fatigue: yes Sick contacts: yes Strep contacts: no  Context: better Recurrent sinusitis: no Relief with OTC cold/cough medications: no  Treatments attempted: mucinex    Relevant past medical, surgical, family and social history reviewed and updated as indicated. Interim medical history since our last visit reviewed. Allergies and medications reviewed and updated.  Review of Systems  Constitutional:  Positive for chills, diaphoresis, fatigue and fever. Negative for activity change, appetite change and unexpected weight change.  HENT:  Positive for congestion, postnasal drip and rhinorrhea. Negative for dental problem, drooling, ear discharge, ear pain, facial swelling, hearing loss, mouth sores, nosebleeds, sinus pressure, sinus pain, sneezing, sore throat, tinnitus, trouble swallowing and voice change.   Eyes: Negative.   Respiratory:  Positive for cough, chest tightness and shortness of breath. Negative for apnea, choking, wheezing and stridor.   Cardiovascular: Negative.   Gastrointestinal: Negative.    Psychiatric/Behavioral: Negative.     Per HPI unless specifically indicated above     Objective:    There were no vitals taken for this visit.  Wt Readings from Last 3 Encounters:  05/30/21 220 lb (99.8 kg)  05/16/21 220 lb 12.8 oz (100.2 kg)  04/04/21 218 lb 12.8 oz (99.2 kg)    Physical Exam Vitals and nursing note reviewed.  Constitutional:      General: She is not in acute distress.    Appearance: Normal appearance. She is not ill-appearing, toxic-appearing or diaphoretic.  HENT:     Head: Normocephalic and atraumatic.     Right Ear: External ear normal.     Left Ear: External ear normal.     Nose: Nose normal.     Mouth/Throat:     Mouth: Mucous membranes are moist.     Pharynx: Oropharynx is clear.  Eyes:     General: No scleral icterus.       Right eye: No discharge.        Left eye: No discharge.     Conjunctiva/sclera: Conjunctivae normal.     Pupils: Pupils are equal, round, and reactive to light.  Pulmonary:     Effort: Pulmonary effort is normal. No respiratory distress.     Comments: Speaking in full sentences Musculoskeletal:        General: Normal range of motion.     Cervical back: Normal range of motion.  Skin:    Coloration: Skin is not jaundiced or pale.     Findings: No bruising, erythema, lesion or rash.  Neurological:     Mental Status: She is alert and oriented to person, place, and time. Mental status  is at baseline.  Psychiatric:        Mood and Affect: Mood normal.        Behavior: Behavior normal.        Thought Content: Thought content normal.        Judgment: Judgment normal.    Results for orders placed or performed in visit on 03/04/21  Cytology - PAP  Result Value Ref Range   High risk HPV Negative    Adequacy      Satisfactory for evaluation; transformation zone component PRESENT.   Diagnosis      - Negative for intraepithelial lesion or malignancy (NILM)   Comment Normal Reference Range HPV - Negative       Assessment &  Plan:   Problem List Items Addressed This Visit       Endocrine   Hypothyroidism   Relevant Medications   levothyroxine (SYNTHROID) 137 MCG tablet   Other Visit Diagnoses     Exposure to the flu    -  Primary   With symptoms and household exposure will treat without testing. Call if not getting better or getting worse.         Follow up plan: Return in about 2 weeks (around 10/15/2021).   This visit was completed via video visit through MyChart due to the restrictions of the COVID-19 pandemic. All issues as above were discussed and addressed. Physical exam was done as above through visual confirmation on video through MyChart. If it was felt that the patient should be evaluated in the office, they were directed there. The patient verbally consented to this visit. Location of the patient: home Location of the provider: work Those involved with this call:  Provider: Olevia Perches, DO CMA: Rolley Sims, CMA Front Desk/Registration: [employer]  Time spent on call:  15 minutes with patient face to face via video conference. More than 50% of this time was spent in counseling and coordination of care. 23 minutes total spent in review of patient's record and preparation of their chart.

## 2021-10-15 ENCOUNTER — Ambulatory Visit: Payer: BC Managed Care – PPO | Admitting: Family Medicine

## 2021-10-15 ENCOUNTER — Encounter: Payer: Self-pay | Admitting: Family Medicine

## 2021-10-15 ENCOUNTER — Other Ambulatory Visit: Payer: Self-pay | Admitting: Family Medicine

## 2021-10-15 ENCOUNTER — Other Ambulatory Visit: Payer: Self-pay

## 2021-10-15 VITALS — BP 136/80 | HR 76 | Temp 98.4°F | Ht 64.0 in | Wt 220.8 lb

## 2021-10-15 DIAGNOSIS — Z23 Encounter for immunization: Secondary | ICD-10-CM | POA: Diagnosis not present

## 2021-10-15 DIAGNOSIS — E038 Other specified hypothyroidism: Secondary | ICD-10-CM

## 2021-10-15 DIAGNOSIS — R7303 Prediabetes: Secondary | ICD-10-CM

## 2021-10-15 DIAGNOSIS — F339 Major depressive disorder, recurrent, unspecified: Secondary | ICD-10-CM | POA: Diagnosis not present

## 2021-10-15 DIAGNOSIS — I10 Essential (primary) hypertension: Secondary | ICD-10-CM | POA: Diagnosis not present

## 2021-10-15 DIAGNOSIS — E559 Vitamin D deficiency, unspecified: Secondary | ICD-10-CM

## 2021-10-15 DIAGNOSIS — M4726 Other spondylosis with radiculopathy, lumbar region: Secondary | ICD-10-CM

## 2021-10-15 MED ORDER — VENLAFAXINE HCL ER 150 MG PO CP24: 150.0000 mg | ORAL_CAPSULE | Freq: Every day | ORAL | 1 refills | Status: DC

## 2021-10-15 MED ORDER — LEVOTHYROXINE SODIUM 137 MCG PO TABS: 137.0000 ug | ORAL_TABLET | Freq: Every day | ORAL | 0 refills | Status: DC

## 2021-10-15 MED ORDER — LOSARTAN POTASSIUM 100 MG PO TABS: 100.0000 mg | ORAL_TABLET | Freq: Every day | ORAL | 1 refills | Status: DC

## 2021-10-15 MED ORDER — HYDROCHLOROTHIAZIDE 25 MG PO TABS: 25.0000 mg | ORAL_TABLET | Freq: Every day | ORAL | 1 refills | Status: DC

## 2021-10-15 NOTE — Assessment & Plan Note (Signed)
On x-ray in April. Now with neuropathy bilaterally. Declines gabapentin. Will get her started with PT. If not better in the next moth, consider MRI.

## 2021-10-15 NOTE — Assessment & Plan Note (Signed)
Has been out of her medicine for >2 weeks. Will restart medicine and recheck next visit.

## 2021-10-15 NOTE — Assessment & Plan Note (Signed)
Under good control on current regimen. Continue current regimen. Continue to monitor. Call with any concerns. Refills given. Labs drawn today.   

## 2021-10-15 NOTE — Assessment & Plan Note (Signed)
Doing well with A1c of 5.9. Continue diet and exercise. Call with any concerns.

## 2021-10-15 NOTE — Assessment & Plan Note (Signed)
Rechecking labs today. Await results. Treat as needed.  °

## 2021-10-15 NOTE — Progress Notes (Signed)
BP 136/80    Pulse 76    Temp 98.4 F (36.9 C) (Oral)    Ht 5\' 4"  (1.626 m)    Wt 220 lb 12.8 oz (100.2 kg)    SpO2 97%    BMI 37.90 kg/m    Subjective:    Patient ID: Emily [name], female    DOB: 1961-08-21, 60 y.o.   MRN: [id number]  HPI: Emily [name] is a 60 y.o. female  Chief Complaint  Patient presents with   Hypertension   Hypothyroidism   Depression   Leg Pain    Patient states she has start to notice an issues with her legs. Patient states she is experiencing tingling, numbness and burning in her legs. Patient states some times she will notice swelling. Patient states when she goes out shopping, she is ready to leave to rest her legs. Patient states she doesn't know if it is associated with her weight or a disk in her back.    Medication Refill    Patient is requesting a refill on Levothyroxine prescription. Patient states she has been taking a old prescription for the past 2 weeks.    HYPERTENSION Hypertension status: controlled  Satisfied with current treatment? yes Duration of hypertension: chronic BP monitoring frequency:  not checking BP medication side effects:  no Medication compliance: excellent compliance Previous BP meds:HCTZ, losartan Aspirin: no Recurrent headaches: no Visual changes: no Palpitations: no Dyspnea: no Chest pain: no Lower extremity edema: no Dizzy/lightheaded: no  HYPOTHYROIDISM- has not been taking her medicine for 2-3 weeks Thyroid control status:controlled Satisfied with current treatment? yes Medication side effects: no Medication compliance: excellent compliance Recent dose adjustment:no Fatigue: no Cold intolerance: no Heat intolerance: no Weight gain: no Weight loss: no Constipation: no Diarrhea/loose stools: no Palpitations: no Lower extremity edema: no Anxiety/depressed mood: no  DEPRESSION Mood status: exacerbated Satisfied with current treatment?: yes Symptom severity: moderate  Duration of current treatment  : chronic Side effects: no Medication compliance: good compliance Psychotherapy/counseling: no  Previous psychiatric medications: effexor Depressed mood: yes Anxious mood: yes Anhedonia: no Significant weight loss or gain: no Insomnia: no  Fatigue: yes Feelings of worthlessness or guilt: yes Impaired concentration/indecisiveness: yes Suicidal ideations: no Hopelessness: no Crying spells: no Depression screen [facility] 2/9 10/15/2021 03/04/2021 01/21/2021 02/28/2020 06/07/2019  Decreased Interest 1 1 0 0 1  Down, Depressed, Hopeless 1 0 0 0 0  PHQ - 2 Score 2 1 0 0 1  Altered sleeping 2 2 - 1 1  Tired, decreased energy 3 1 - 0 3  Change in appetite 1 0 - 0 0  Feeling bad or failure about yourself  2 0 - 0 0  Trouble concentrating 2 1 - 1 1  Moving slowly or fidgety/restless 0 0 - 0 0  Suicidal thoughts 0 0 - 0 0  PHQ-9 Score 12 5 - 2 6  Difficult doing work/chores - Not difficult at all - - Somewhat difficult  Some recent data might be hidden   NEUROPATHY Neuropathy status: exacerbated  Satisfied with current treatment?: no Medication side effects: not on anything- stopped gabapentin Medication compliance:  poor compliance Location: bilateral legs Pain: yes Severity: moderate  Quality:  aching, sore and numb Frequency: constant Bilateral: yes Symmetric: yes Numbness: yes Decreased sensation: yes Weakness: yes Context: worse  Relevant past medical, surgical, family and social history reviewed and updated as indicated. Interim medical history since our last visit reviewed. Allergies and medications reviewed and updated.  Review of  Systems  Constitutional: Negative.   Respiratory: Negative.    Cardiovascular: Negative.   Gastrointestinal: Negative.   Musculoskeletal:  Positive for back pain and myalgias. Negative for arthralgias, gait problem, joint swelling, neck pain and neck stiffness.  Neurological:  Positive for weakness and numbness. Negative for dizziness, tremors,  seizures, syncope, facial asymmetry, speech difficulty, light-headedness and headaches.  Psychiatric/Behavioral:  Positive for dysphoric mood. Negative for agitation, behavioral problems, confusion, decreased concentration, hallucinations, self-injury, sleep disturbance and suicidal ideas. The patient is not nervous/anxious and is not hyperactive.    Per HPI unless specifically indicated above     Objective:    BP 136/80    Pulse 76    Temp 98.4 F (36.9 C) (Oral)    Ht 5\' 4"  (1.626 m)    Wt 220 lb 12.8 oz (100.2 kg)    SpO2 97%    BMI 37.90 kg/m   Wt Readings from Last 3 Encounters:  10/15/21 220 lb 12.8 oz (100.2 kg)  05/30/21 220 lb (99.8 kg)  05/16/21 220 lb 12.8 oz (100.2 kg)    Physical Exam Vitals and nursing note reviewed.  Constitutional:      General: She is not in acute distress.    Appearance: Normal appearance. She is not ill-appearing, toxic-appearing or diaphoretic.  HENT:     Head: Normocephalic and atraumatic.     Right Ear: External ear normal.     Left Ear: External ear normal.     Nose: Nose normal.     Mouth/Throat:     Mouth: Mucous membranes are moist.     Pharynx: Oropharynx is clear.  Eyes:     General: No scleral icterus.       Right eye: No discharge.        Left eye: No discharge.     Extraocular Movements: Extraocular movements intact.     Conjunctiva/sclera: Conjunctivae normal.     Pupils: Pupils are equal, round, and reactive to light.  Cardiovascular:     Rate and Rhythm: Normal rate and regular rhythm.     Pulses: Normal pulses.     Heart sounds: Normal heart sounds. No murmur heard.   No friction rub. No gallop.  Pulmonary:     Effort: Pulmonary effort is normal. No respiratory distress.     Breath sounds: Normal breath sounds. No stridor. No wheezing, rhonchi or rales.  Chest:     Chest wall: No tenderness.  Musculoskeletal:        General: Normal range of motion.     Cervical back: Normal range of motion and neck supple.  Skin:     General: Skin is warm and dry.     Capillary Refill: Capillary refill takes less than 2 seconds.     Coloration: Skin is not jaundiced or pale.     Findings: No bruising, erythema, lesion or rash.  Neurological:     General: No focal deficit present.     Mental Status: She is alert and oriented to person, place, and time. Mental status is at baseline.  Psychiatric:        Mood and Affect: Mood normal.        Behavior: Behavior normal.        Thought Content: Thought content normal.        Judgment: Judgment normal.    Results for orders placed or performed in visit on 03/04/21  Cytology - PAP  Result Value Ref Range   High risk HPV Negative  Adequacy      Satisfactory for evaluation; transformation zone component PRESENT.   Diagnosis      - Negative for intraepithelial lesion or malignancy (NILM)   Comment Normal Reference Range HPV - Negative       Assessment & Plan:   Problem List Items Addressed This Visit       Cardiovascular and Mediastinum   Essential hypertension    Under good control on current regimen. Continue current regimen. Continue to monitor. Call with any concerns. Refills given. Labs drawn today.       Relevant Medications   hydrochlorothiazide (HYDRODIURIL) 25 MG tablet   losartan (COZAAR) 100 MG tablet   Other Relevant Orders   Comprehensive metabolic panel   CBC with Differential/Platelet   Microalbumin, Urine Waived     Endocrine   Hypothyroidism - Primary    Has been out of her medicine for >2 weeks. Will restart medicine and recheck next visit.       Relevant Medications   levothyroxine (SYNTHROID) 137 MCG tablet   Other Relevant Orders   Comprehensive metabolic panel   CBC with Differential/Platelet   TSH     Nervous and Auditory   Osteoarthritis of spine with radiculopathy, lumbar region    On x-ray in April. Now with neuropathy bilaterally. Declines gabapentin. Will get her started with PT. If not better in the next moth, consider  MRI.       Relevant Medications   venlafaxine XR (EFFEXOR-XR) 150 MG 24 hr capsule   Other Relevant Orders   Ambulatory referral to Physical Therapy     Other   Depression, recurrent (HCC)    Exaceberated, but wants to stay on current dose. Continue to monitor. Call with any concerns.       Relevant Medications   venlafaxine XR (EFFEXOR-XR) 150 MG 24 hr capsule   Other Relevant Orders   Comprehensive metabolic panel   CBC with Differential/Platelet   Vitamin D deficiency    Rechecking labs today. Await results. Treat as needed.       Relevant Orders   Comprehensive metabolic panel   CBC with Differential/Platelet   VITAMIN D 25 Hydroxy (Vit-D Deficiency, Fractures)   Prediabetes    Doing well with A1c of 5.9. Continue diet and exercise. Call with any concerns.       Relevant Orders   Comprehensive metabolic panel   CBC with Differential/Platelet   Bayer DCA Hb A1c Waived   Other Visit Diagnoses     Needs flu shot       Relevant Orders   Flu Vaccine QUAD 6+ mos PF IM (Fluarix Quad PF)        Follow up plan: Return in about 4 weeks (around 11/12/2021), or follow up back pain.

## 2021-10-15 NOTE — Assessment & Plan Note (Signed)
Exaceberated, but wants to stay on current dose. Continue to monitor. Call with any concerns.

## 2021-10-16 LAB — TSH: TSH: 6.58 u[IU]/mL — ABNORMAL HIGH (ref 0.450–4.500)

## 2021-10-16 LAB — CBC WITH DIFFERENTIAL/PLATELET
Basophils Absolute: 0.1 10*3/uL (ref 0.0–0.2)
Basos: 1 %
EOS (ABSOLUTE): 0.2 10*3/uL (ref 0.0–0.4)
Eos: 3 %
Hematocrit: 40.6 % (ref 34.0–46.6)
Hemoglobin: 14 g/dL (ref 11.1–15.9)
Immature Grans (Abs): 0 10*3/uL (ref 0.0–0.1)
Immature Granulocytes: 0 %
Lymphocytes Absolute: 4 10*3/uL — ABNORMAL HIGH (ref 0.7–3.1)
Lymphs: 47 %
MCH: 30.9 pg (ref 26.6–33.0)
MCHC: 34.5 g/dL (ref 31.5–35.7)
MCV: 90 fL (ref 79–97)
Monocytes Absolute: 0.7 10*3/uL (ref 0.1–0.9)
Monocytes: 8 %
Neutrophils Absolute: 3.5 10*3/uL (ref 1.4–7.0)
Neutrophils: 41 %
Platelets: 298 10*3/uL (ref 150–450)
RBC: 4.53 x10E6/uL (ref 3.77–5.28)
RDW: 12.6 % (ref 11.7–15.4)
WBC: 8.5 10*3/uL (ref 3.4–10.8)

## 2021-10-16 LAB — COMPREHENSIVE METABOLIC PANEL
ALT: 25 IU/L (ref 0–32)
AST: 22 IU/L (ref 0–40)
Albumin/Globulin Ratio: 1.5 (ref 1.2–2.2)
Albumin: 4.8 g/dL (ref 3.8–4.8)
Alkaline Phosphatase: 96 IU/L (ref 44–121)
BUN/Creatinine Ratio: 21 (ref 12–28)
BUN: 15 mg/dL (ref 8–27)
Bilirubin Total: 0.3 mg/dL (ref 0.0–1.2)
CO2: 27 mmol/L (ref 20–29)
Calcium: 10.1 mg/dL (ref 8.7–10.3)
Chloride: 100 mmol/L (ref 96–106)
Creatinine, Ser: 0.72 mg/dL (ref 0.57–1.00)
Globulin, Total: 3.2 g/dL (ref 1.5–4.5)
Glucose: 74 mg/dL (ref 70–99)
Potassium: 4 mmol/L (ref 3.5–5.2)
Sodium: 141 mmol/L (ref 134–144)
Total Protein: 8 g/dL (ref 6.0–8.5)
eGFR: 94 mL/min/{1.73_m2} (ref >59)

## 2021-10-16 LAB — MICROALBUMIN, URINE WAIVED
Creatinine, Urine Waived: 200 mg/dL (ref 10–300)
Microalb, Ur Waived: 80 mg/L — ABNORMAL HIGH (ref 0–19)

## 2021-10-16 LAB — BAYER DCA HB A1C WAIVED: HB A1C (BAYER DCA - WAIVED): 5.9 % — ABNORMAL HIGH (ref 4.8–5.6)

## 2021-10-16 LAB — VITAMIN D 25 HYDROXY (VIT D DEFICIENCY, FRACTURES): Vit D, 25-Hydroxy: 30.6 ng/mL (ref 30.0–100.0)

## 2021-10-16 NOTE — Telephone Encounter (Signed)
Requested medication (s) are due for refill today: yes  Requested medication (s) are on the active medication list: yes  Last refill:  10/15/21  Future visit scheduled: yes  Notes to clinic: Patient is due for labs and requesting 90 day supply        Requested Prescriptions  Pending Prescriptions Disp Refills   levothyroxine (SYNTHROID) 137 MCG tablet [Pharmacy Med Name: LEVOTHYROXINE 0.137MG  ( ) TAB] 90 tablet     Sig: TAKE 1 TABLET(137 MCG) BY MOUTH DAILY BEFORE BREAKFAST     Endocrinology:  Hypothyroid Agents Failed - 10/15/2021  6:08 PM      Failed - TSH needs to be rechecked within 3 months after an abnormal result. Refill until TSH is due.      Passed - TSH in normal range and within 360 days    TSH  Date Value Ref Range Status  10/15/2021 6.580 (H) 0.450 - 4.500 uIU/mL Final          Passed - Valid encounter within last 12 months    Recent Outpatient Visits           Yesterday Other specified hypothyroidism   [facility] [place], Megan P, DO   2 weeks ago Exposure to the flu   [facility] [place], Megan P, DO   6 months ago Cervical radiculopathy   [facility] [place], Megan P, DO   7 months ago Routine general medical examination at a health care facility   [facility] [place] P, DO   7 months ago Cervical radiculopathy   [facility] [place], Jake Church, NP       Future Appointments             In 3 weeks Laural Benes, Oralia Rud, DO [facility]

## 2021-10-17 NOTE — Telephone Encounter (Signed)
Will be rechecking her thyroid at her follow up in 1 month to see if dose is appropriate. 90 days not appropriate

## 2021-10-24 ENCOUNTER — Other Ambulatory Visit: Payer: Self-pay

## 2021-10-24 ENCOUNTER — Encounter (INDEPENDENT_AMBULATORY_CARE_PROVIDER_SITE_OTHER): Payer: Self-pay | Admitting: Bariatrics

## 2021-10-24 ENCOUNTER — Ambulatory Visit (INDEPENDENT_AMBULATORY_CARE_PROVIDER_SITE_OTHER): Payer: BC Managed Care – PPO | Admitting: Bariatrics

## 2021-10-24 VITALS — BP 123/79 | HR 75 | Temp 99.1°F | Ht 64.0 in | Wt 218.0 lb

## 2021-10-24 DIAGNOSIS — Z1331 Encounter for screening for depression: Secondary | ICD-10-CM

## 2021-10-24 DIAGNOSIS — E559 Vitamin D deficiency, unspecified: Secondary | ICD-10-CM | POA: Diagnosis not present

## 2021-10-24 DIAGNOSIS — R0602 Shortness of breath: Secondary | ICD-10-CM

## 2021-10-24 DIAGNOSIS — E785 Hyperlipidemia, unspecified: Secondary | ICD-10-CM | POA: Diagnosis not present

## 2021-10-24 DIAGNOSIS — I10 Essential (primary) hypertension: Secondary | ICD-10-CM | POA: Diagnosis not present

## 2021-10-24 DIAGNOSIS — E038 Other specified hypothyroidism: Secondary | ICD-10-CM

## 2021-10-24 DIAGNOSIS — E66812 Obesity, class 2: Secondary | ICD-10-CM

## 2021-10-24 DIAGNOSIS — Z6837 Body mass index (BMI) 37.0-37.9, adult: Secondary | ICD-10-CM

## 2021-10-24 DIAGNOSIS — R5383 Other fatigue: Secondary | ICD-10-CM | POA: Diagnosis not present

## 2021-10-24 DIAGNOSIS — R7989 Other specified abnormal findings of blood chemistry: Secondary | ICD-10-CM

## 2021-10-24 DIAGNOSIS — R7303 Prediabetes: Secondary | ICD-10-CM

## 2021-10-24 NOTE — Progress Notes (Signed)
Chief Complaint:   OBESITY LAURALYE [name] (MR# [id number]) is a 61 y.o. female who presents for evaluation and treatment of obesity and related comorbidities. Current BMI is Body mass index is 37.42 kg/m. Emily [name] has been struggling with her weight for many years and has been unsuccessful in either losing weight, maintaining weight loss, or reaching her healthy weight goal.  Emily [name] is a returning patient with her last visit being 05/04/2019. She sometimes likes to cook. She craves sweets and pastries. She is up 9 lbs since her last visit.   Emily [name] is currently in the action stage of change and ready to dedicate time achieving and maintaining a healthier weight. Emily [name] is interested in becoming our patient and working on intensive lifestyle modifications including (but not limited to) diet and exercise for weight loss.  Emily [name] habits were reviewed today and are as follows: Her family eats meals together, her desired weight loss is 50 pounds, she has been heavy most of her life, she started gaining weight in 2015, her heaviest weight ever was 225 pounds, she has significant food cravings issues, she snacks frequently in the evenings, she skips meals frequently, she is frequently drinking liquids with calories, she frequently makes poor food choices, she frequently eats larger portions than normal, she has binge eating behaviors, and she struggles with emotional eating.  Depression Screen Emily [name] Food and Mood (modified PHQ-9) score was 12.  Depression screen PHQ 2/9 10/24/2021  Decreased Interest 3  Down, Depressed, Hopeless 2  PHQ - 2 Score 5  Altered sleeping 2  Tired, decreased energy 2  Change in appetite 1  Feeling bad or failure about yourself  1  Trouble concentrating 1  Moving slowly or fidgety/restless 0  Suicidal thoughts 0  PHQ-9 Score 12  Difficult doing work/chores Somewhat difficult  Some recent data might be hidden   Subjective:   1. Other fatigue Emily [name] reports daytime  somnolence and admits to waking up still tired. Patent has a history of symptoms of morning fatigue and morning headaches. Emily [name] states that she has difficulty falling asleep. Snoring is present. Apneic episodes is not present. Epworth Sleepiness Score is 0.   2. SOB (shortness of breath) on exertion Emily [name] notes increasing shortness of breath with exercising and seems to be worsening over time with weight gain. She notes getting out of breath sooner with activity than she used to. This has not gotten worse recently. Emily [name] denies shortness of breath at rest or orthopnea.   3. Essential hypertension Emily [name] is currently taking Losartan and HCTZ.   4. Other specified hypothyroidism Emily [name] is taking Levothyroxine currently.  5. Pre-diabetes Emily [name] is currently not on medications, and her last A1c was 5.9.   6. Elevated TSH Emily [name]'s last TSH level was 6.580.  7. Vitamin D insufficiency Emily [name]'s last Vitamin D level was 30.6.  Assessment/Plan:   1. Other fatigue Emily [name] does feel that her weight is causing her energy to be lower than it should be. Fatigue may be related to obesity, depression or many other causes. Labs will be ordered, and in the meanwhile, Emily [name] will focus on self care including making healthy food choices, increasing physical activity and focusing on stress reduction.   2. SOB (shortness of breath) on exertion Emily [name] does feel that she gets out of breath more easily that she used to when she exercises. Emily [name]'s shortness of breath appears to be obesity related and exercise induced. She has agreed to work on weight loss and gradually increase  exercise to treat her exercise induced shortness of breath. Will continue to monitor closely.  - Lipid Panel With LDL/HDL Ratio  3. Essential hypertension Emily [name] will continue her medications. She is working on healthy weight loss and exercise to improve blood pressure control. We will watch for signs of hypotension as she continues her lifestyle  modifications.We will check Lipid Panel today.   - Lipid Panel With LDL/HDL Ratio  4. Other specified hypothyroidism Emily [name] will continue her medications. Orders and follow up as documented in patient record.  Counseling Good thyroid control is important for overall health. Emily [name] thyroid levels are dangerous and will not improve weight loss results. Counseling: The correct way to take levothyroxine is fasting, with water, separated by at least 30 minutes from breakfast, and separated by more than 4 hours from calcium, iron, multivitamins, acid reflux medications (PPIs).    5. Pre-diabetes Emily [name] will continue to work on weight loss, exercise, and decreasing simple carbohydrates to help decrease the risk of diabetes. We will check insulin today.   - Insulin, random  6. Elevated TSH Emily [name] will continue her medication. Orders and follow up as documented in patient record.  7. Vitamin D insufficiency Low Vitamin D level contributes to fatigue and are associated with obesity, breast, and colon cancer. We will check Vitamin D today. Emily [name] will follow-up for routine testing of Vitamin D, at least 2-3 times per year to avoid over-replacement.  - VITAMIN D 25 Hydroxy (Vit-D Deficiency, Fractures)  8. Depression screen Emily [name] had a negative depression screening. Depression is commonly associated with obesity and often results in emotional eating behaviors. We will monitor this closely and work on CBT to help improve the non-hunger eating patterns. Referral to Psychology may be required if no improvement is seen as she continues in our clinic.   9. Class 2 severe obesity with serious comorbidity and body mass index (BMI) of 37.0 to 37.9 in adult, unspecified obesity type (HCC) Emily [name] is currently in the action stage of change and her goal is to continue with weight loss efforts. I recommend Emily [name] begin the structured treatment plan as follows:  She has agreed to the Category 3 Plan.  Shenee will  continue meal planning and she will continue intentional eating. We reviewed labs from 10/15/2021 CMP, microalbumin urine, and A1C. She will not skip meals.   Exercise goals: Emily [name] will start physical therapy next week.   Behavioral modification strategies: increasing lean protein intake, decreasing simple carbohydrates, increasing vegetables, increasing water intake, decreasing eating out, no skipping meals, meal planning and cooking strategies, keeping healthy foods in the home, and planning for success.  She was informed of the importance of frequent follow-up visits to maximize her success with intensive lifestyle modifications for her multiple health conditions. She was informed we would discuss her lab results at her next visit unless there is a critical issue that needs to be addressed sooner. Emily [name] agreed to keep her next visit at the agreed upon time to discuss these results.  Objective:   Blood pressure 123/79, pulse 75, temperature 99.1 F (37.3 C), height 5\' 4"  (1.626 m), weight 218 lb (98.9 kg), SpO2 96 %. Body mass index is 37.42 kg/m.  EKG: Normal sinus rhythm, rate unable to obtain.  Indirect Calorimeter completed today shows a VO2 of 294 and a REE of 2030.  Her calculated basal metabolic rate is thus her basal metabolic rate is better than expected.  General: Cooperative, alert, well developed, in no acute distress. HEENT: Conjunctivae  and lids unremarkable. Cardiovascular: Regular rhythm.  Lungs: Normal work of breathing. Neurologic: No focal deficits.   Lab Results  Component Value Date   CREATININE 0.72 10/15/2021   BUN 15 10/15/2021   NA 141 10/15/2021   K 4.0 10/15/2021   CL 100 10/15/2021   CO2 27 10/15/2021   Lab Results  Component Value Date   ALT 25 10/15/2021   AST 22 10/15/2021   ALKPHOS 96 10/15/2021   BILITOT 0.3 10/15/2021   Lab Results  Component Value Date   HGBA1C 5.9 (H) 10/15/2021   HGBA1C 5.5 01/21/2021   HGBA1C 5.6 02/28/2020    HGBA1C 5.6 06/07/2019   HGBA1C 5.7 (H) 11/17/2018   Lab Results  Component Value Date   INSULIN 33.8 (H) 11/17/2018   Lab Results  Component Value Date   TSH 6.580 (H) 10/15/2021   Lab Results  Component Value Date   CHOL 236 (H) 01/21/2021   HDL 45 01/21/2021   LDLCALC 147 (H) 01/21/2021   TRIG 242 (H) 01/21/2021   Lab Results  Component Value Date   WBC 8.5 10/15/2021   HGB 14.0 10/15/2021   HCT 40.6 10/15/2021   MCV 90 10/15/2021   PLT 298 10/15/2021   No results found for: IRON, TIBC, FERRITIN Obesity Behavioral Intervention:   Approximately 15 minutes were spent on the discussion below.  ASK: We discussed the diagnosis of obesity with Emily [name] today and Emily [name] agreed to give Emily [name] permission to discuss obesity behavioral modification therapy today.  ASSESS: Emily [name] has the diagnosis of obesity and her BMI today is 37.5. Emily [name] is in the action stage of change.   ADVISE: Emily [name] was educated on the multiple health risks of obesity as well as the benefit of weight loss to improve her health. She was advised of the need for long term treatment and the importance of lifestyle modifications to improve her current health and to decrease her risk of future health problems.  AGREE: Multiple dietary modification options and treatment options were discussed and Emily [name] agreed to follow the recommendations documented in the above note.  ARRANGE: Emily [name] was educated on the importance of frequent visits to treat obesity as outlined per CMS and USPSTF guidelines and agreed to schedule her next follow up appointment today.  Attestation Statements:   Reviewed by clinician on day of visit: allergies, medications, problem list, medical history, surgical history, family history, social history, and previous encounter notes.  I, Jackson LatinoAlisa White, RMA, am acting as Energy managertranscriptionist for [employer] Markice Torbert, DO.  I have reviewed the above documentation for accuracy and completeness, and I agree with the above. Corinna Capra-  Janaki Exley, DO

## 2021-10-25 LAB — LIPID PANEL WITH LDL/HDL RATIO
Cholesterol, Total: 265 mg/dL — ABNORMAL HIGH (ref 100–199)
HDL: 48 mg/dL (ref >39)
LDL Chol Calc (NIH): 174 mg/dL — ABNORMAL HIGH (ref 0–99)
LDL/HDL Ratio: 3.6 ratio — ABNORMAL HIGH (ref 0.0–3.2)
Triglycerides: 227 mg/dL — ABNORMAL HIGH (ref 0–149)
VLDL Cholesterol Cal: 43 mg/dL — ABNORMAL HIGH (ref 5–40)

## 2021-10-25 LAB — VITAMIN D 25 HYDROXY (VIT D DEFICIENCY, FRACTURES): Vit D, 25-Hydroxy: 25.7 ng/mL — ABNORMAL LOW (ref 30.0–100.0)

## 2021-10-25 LAB — INSULIN, RANDOM: INSULIN: 21.9 u[IU]/mL (ref 2.6–24.9)

## 2021-10-28 ENCOUNTER — Encounter (INDEPENDENT_AMBULATORY_CARE_PROVIDER_SITE_OTHER): Payer: Self-pay | Admitting: Bariatrics

## 2021-10-28 DIAGNOSIS — E785 Hyperlipidemia, unspecified: Secondary | ICD-10-CM | POA: Insufficient documentation

## 2021-10-28 DIAGNOSIS — E559 Vitamin D deficiency, unspecified: Secondary | ICD-10-CM | POA: Insufficient documentation

## 2021-10-31 DIAGNOSIS — M4726 Other spondylosis with radiculopathy, lumbar region: Secondary | ICD-10-CM | POA: Diagnosis not present

## 2021-11-05 DIAGNOSIS — M4726 Other spondylosis with radiculopathy, lumbar region: Secondary | ICD-10-CM | POA: Diagnosis not present

## 2021-11-06 ENCOUNTER — Ambulatory Visit (INDEPENDENT_AMBULATORY_CARE_PROVIDER_SITE_OTHER): Payer: BC Managed Care – PPO | Admitting: Bariatrics

## 2021-11-06 ENCOUNTER — Other Ambulatory Visit: Payer: Self-pay

## 2021-11-06 ENCOUNTER — Encounter (INDEPENDENT_AMBULATORY_CARE_PROVIDER_SITE_OTHER): Payer: Self-pay | Admitting: Bariatrics

## 2021-11-06 VITALS — BP 137/79 | HR 67 | Temp 97.9°F | Ht 64.0 in | Wt 213.0 lb

## 2021-11-06 DIAGNOSIS — R7303 Prediabetes: Secondary | ICD-10-CM | POA: Diagnosis not present

## 2021-11-06 DIAGNOSIS — E559 Vitamin D deficiency, unspecified: Secondary | ICD-10-CM | POA: Diagnosis not present

## 2021-11-06 DIAGNOSIS — E7800 Pure hypercholesterolemia, unspecified: Secondary | ICD-10-CM | POA: Diagnosis not present

## 2021-11-06 DIAGNOSIS — Z6836 Body mass index (BMI) 36.0-36.9, adult: Secondary | ICD-10-CM

## 2021-11-06 MED ORDER — VITAMIN D (ERGOCALCIFEROL) 1.25 MG (50000 UNIT) PO CAPS: 50000.0000 [IU] | ORAL_CAPSULE | ORAL | 0 refills | Status: DC

## 2021-11-06 NOTE — Progress Notes (Signed)
Chief Complaint:   OBESITY Emily [name] is here to discuss her progress with her obesity treatment plan along with follow-up of her obesity related diagnoses. Emily [name] is on the Category 3 Plan and states she is following her eating plan approximately 80% of the time. Emily [name] states she is doing 0 minutes 0 times per week.  Today's visit was #: 2 Starting weight: 218 lbs Starting date: 10/24/2021 Today's weight: 213 lbs Today's date: 11/06/2021 Total lbs lost to date: 5 lbs Total lbs lost since last in-office visit: 5 lbs  Interim History: Emily [name] is down 5 lbs since her last visit. She states that it was not bad.  Subjective:   1. Pre-diabetes Emily [name]'s appetite is normal. Her last A1C was 5.9. Her insulin resistance was 26.9.  2. Elevated cholesterol Emily [name] has a history of heart disease. Her Total cholesterol was 26.5. Her HDL was 48. Her Triglycerides was increased. Her LDL was 174.  3. Vitamin D insufficiency Emily [name] is currently not taking Vitamin D. Her last Vitamin D was 25.7.   Assessment/Plan:   1. Pre-diabetes Emily [name] will continue to work on weight loss, exercise, and decreasing simple carbohydrates to help decrease the risk of diabetes. She will increase healthy fats and protein. Handout: Insulin resistance and Pre-diabetes were provided today.   2. Elevated cholesterol Cardiovascular risk and specific lipid/LDL goals reviewed.  Emily [name] will continue to follow diet and exercise. CT for colony calcium/score.  She will talk to her primary care physician if greater than 0, she will consider low dose medication. We discussed several lifestyle modifications today and Emily [name] will continue to work on diet, exercise and weight loss efforts. Orders and follow up as documented in patient record.   Counseling Intensive lifestyle modifications are the first line treatment for this issue. Dietary changes: Increase soluble fiber. Decrease simple carbohydrates. Exercise changes: Moderate to vigorous-intensity  aerobic activity 150 minutes per week if tolerated. Lipid-lowering medications: see documented in medical record.  3. Vitamin D insufficiency Low Vitamin D level contributes to fatigue and are associated with obesity, breast, and colon cancer. We will refill prescription Vitamin D 50,000 IU every week for 1 month with no refills and Emily [name] will follow-up for routine testing of Vitamin D, at least 2-3 times per year to avoid over-replacement.  - Vitamin D, Ergocalciferol, (Emily [name]) 1.25 MG (50000 UNIT) CAPS capsule; Take 1 capsule (50,000 Units total) by mouth every 7 (seven) days.  Dispense: 4 capsule; Refill: 0  4. Obesity, current BMI 36.6 Emily [name] is currently in the action stage of change. As such, her goal is to continue with weight loss efforts. She has agreed to the Category 3 Plan.   Emily [name] will continue meal planning and she will continue intentional eating. We reviewed labs from 10/24/2021 Lipids, Vitamin D, CMP and insulin.   Exercise goals: No exercise has been prescribed at this time.  Behavioral modification strategies: increasing lean protein intake, decreasing simple carbohydrates, increasing vegetables, increasing water intake, decreasing eating out, no skipping meals, meal planning and cooking strategies, keeping healthy foods in the home, and planning for success.  Emily [name] has agreed to follow-up with our clinic in 2 weeks. She was informed of the importance of frequent follow-up visits to maximize her success with intensive lifestyle modifications for her multiple health conditions.   Objective:   Blood pressure 137/79, pulse 67, temperature 97.9 F (36.6 C), height 5\' 4"  (1.626 m), weight 213 lb (96.6 kg), SpO2 96 %. Body mass index is 36.56 kg/m.  General: Cooperative, alert, well developed, in no acute distress. HEENT: Conjunctivae and lids unremarkable. Cardiovascular: Regular rhythm.  Lungs: Normal work of breathing. Neurologic: No focal deficits.   Lab Results  Component  Value Date   CREATININE 0.72 10/15/2021   BUN 15 10/15/2021   NA 141 10/15/2021   K 4.0 10/15/2021   CL 100 10/15/2021   CO2 27 10/15/2021   Lab Results  Component Value Date   ALT 25 10/15/2021   AST 22 10/15/2021   ALKPHOS 96 10/15/2021   BILITOT 0.3 10/15/2021   Lab Results  Component Value Date   HGBA1C 5.9 (H) 10/15/2021   HGBA1C 5.5 01/21/2021   HGBA1C 5.6 02/28/2020   HGBA1C 5.6 06/07/2019   HGBA1C 5.7 (H) 11/17/2018   Lab Results  Component Value Date   INSULIN 21.9 10/24/2021   INSULIN 33.8 (H) 11/17/2018   Lab Results  Component Value Date   TSH 6.580 (H) 10/15/2021   Lab Results  Component Value Date   CHOL 265 (H) 10/24/2021   HDL 48 10/24/2021   LDLCALC 174 (H) 10/24/2021   TRIG 227 (H) 10/24/2021   Lab Results  Component Value Date   VD25OH 25.7 (L) 10/24/2021   VD25OH 30.6 10/15/2021   VD25OH 23.1 (L) 02/28/2020   Lab Results  Component Value Date   WBC 8.5 10/15/2021   HGB 14.0 10/15/2021   HCT 40.6 10/15/2021   MCV 90 10/15/2021   PLT 298 10/15/2021   No results found for: IRON, TIBC, FERRITIN  Attestation Statements:   Reviewed by clinician on day of visit: allergies, medications, problem list, medical history, surgical history, family history, social history, and previous encounter notes.  I, Jackson Latino, RMA, am acting as Energy manager for [employer], DO.  I have reviewed the above documentation for accuracy and completeness, and I agree with the above. Corinna Capra, DO

## 2021-11-07 ENCOUNTER — Ambulatory Visit (INDEPENDENT_AMBULATORY_CARE_PROVIDER_SITE_OTHER): Payer: BC Managed Care – PPO | Admitting: Bariatrics

## 2021-11-08 DIAGNOSIS — M4726 Other spondylosis with radiculopathy, lumbar region: Secondary | ICD-10-CM | POA: Diagnosis not present

## 2021-11-11 ENCOUNTER — Encounter (INDEPENDENT_AMBULATORY_CARE_PROVIDER_SITE_OTHER): Payer: Self-pay | Admitting: Bariatrics

## 2021-11-12 ENCOUNTER — Other Ambulatory Visit: Payer: Self-pay

## 2021-11-12 ENCOUNTER — Ambulatory Visit: Payer: BC Managed Care – PPO | Admitting: Family Medicine

## 2021-11-12 ENCOUNTER — Telehealth: Payer: Self-pay

## 2021-11-12 ENCOUNTER — Encounter: Payer: Self-pay | Admitting: Family Medicine

## 2021-11-12 VITALS — BP 121/79 | HR 84 | Temp 98.5°F | Wt 217.0 lb

## 2021-11-12 DIAGNOSIS — E038 Other specified hypothyroidism: Secondary | ICD-10-CM

## 2021-11-12 DIAGNOSIS — F339 Major depressive disorder, recurrent, unspecified: Secondary | ICD-10-CM | POA: Diagnosis not present

## 2021-11-12 DIAGNOSIS — M5440 Lumbago with sciatica, unspecified side: Secondary | ICD-10-CM

## 2021-11-12 DIAGNOSIS — E782 Mixed hyperlipidemia: Secondary | ICD-10-CM

## 2021-11-12 DIAGNOSIS — R509 Fever, unspecified: Secondary | ICD-10-CM | POA: Diagnosis not present

## 2021-11-12 DIAGNOSIS — G8929 Other chronic pain: Secondary | ICD-10-CM

## 2021-11-12 NOTE — Assessment & Plan Note (Signed)
Feeling better. Continue current regimen. Continue to monitor.

## 2021-11-12 NOTE — Progress Notes (Signed)
BP 121/79    Pulse 84    Temp 98.5 F (36.9 C)    Wt 217 lb (98.4 kg)    SpO2 96%    BMI 37.25 kg/m    Subjective:    Patient ID: Emily [name], female    DOB: 13-Nov-1960, 61 y.o.   MRN: [id number]  HPI: Emily [name] is a 61 y.o. female  Chief Complaint  Patient presents with   Hypothyroidism   Depression   Osteoarthritis   Fever    Patient states she started to feel sick last night, felt like she had a fever. Patient states she had body aches, cough and sore throat. Patient states she took an at home COVID test this morning and was negative.    UPPER RESPIRATORY TRACT INFECTION Duration: 1 day Worst symptom: cough Fever: yes Cough: yes Shortness of breath: no Wheezing: no Chest pain: no Chest tightness: yes Chest congestion: no Nasal congestion: no Runny nose: yes Post nasal drip: no Sneezing: no Sore throat: yes Swollen glands: no Sinus pressure: no Headache: yes Face pain: no Toothache: no Ear pain: yes "right Ear pressure: no  Eyes red/itching:no Eye drainage/crusting: no  Vomiting: no Rash: no Fatigue: yes Sick contacts: no Strep contacts: no  Context: better Recurrent sinusitis: no Relief with OTC cold/cough medications: no  Treatments attempted: none   HYPOTHYROIDISM Thyroid control status:better Satisfied with current treatment? yes Medication side effects: no Medication compliance: excellent compliance Etiology of hypothyroidism:  Recent dose adjustment:yes Fatigue: yes Cold intolerance: no Heat intolerance: no Weight gain: no Weight loss: no Constipation: no Diarrhea/loose stools: no Palpitations: no Lower extremity edema: no Anxiety/depressed mood: yes  DEPRESSION Mood status: better Satisfied with current treatment?: yes Symptom severity: mild  Duration of current treatment : chronic Side effects: no Medication compliance: excellent compliance Psychotherapy/counseling: no  Previous psychiatric medications: celexa Depressed  mood: yes Anxious mood: yes Anhedonia: no Significant weight loss or gain: no Insomnia: no  Fatigue: yes Feelings of worthlessness or guilt: no Impaired concentration/indecisiveness: no Suicidal ideations: no Hopelessness: no Crying spells: no Depression screen [facility] 2/9 11/12/2021 10/24/2021 10/15/2021 03/04/2021 01/21/2021  Decreased Interest 1 3 1 1  0  Down, Depressed, Hopeless 1 2 1  0 0  PHQ - 2 Score 2 5 2 1  0  Altered sleeping 2 2 2 2  -  Tired, decreased energy 2 2 3 1  -  Change in appetite 0 1 1 0 -  Feeling bad or failure about yourself  0 1 2 0 -  Trouble concentrating 1 1 2 1  -  Moving slowly or fidgety/restless 0 0 0 0 -  Suicidal thoughts 0 0 0 0 -  PHQ-9 Score 7 12 12 5  -  Difficult doing work/chores - Somewhat difficult - Not difficult at all -  Some recent data might be hidden   PT has been doing OK. Feels like she is getting a little better.   Relevant past medical, surgical, family and social history reviewed and updated as indicated. Interim medical history since our last visit reviewed. Allergies and medications reviewed and updated.  Review of Systems  Constitutional:  Positive for chills, diaphoresis, fatigue and fever. Negative for activity change, appetite change and unexpected weight change.  Respiratory:  Positive for cough, shortness of breath and wheezing. Negative for apnea, choking, chest tightness and stridor.   Cardiovascular: Negative.   Gastrointestinal: Negative.   Musculoskeletal: Negative.   Neurological: Negative.   Psychiatric/Behavioral: Negative.     Per HPI unless  specifically indicated above     Objective:    BP 121/79    Pulse 84    Temp 98.5 F (36.9 C)    Wt 217 lb (98.4 kg)    SpO2 96%    BMI 37.25 kg/m   Wt Readings from Last 3 Encounters:  11/12/21 217 lb (98.4 kg)  11/06/21 213 lb (96.6 kg)  10/24/21 218 lb (98.9 kg)    Physical Exam Vitals and nursing note reviewed.  Constitutional:      General: She is not in acute  distress.    Appearance: Normal appearance. She is not ill-appearing, toxic-appearing or diaphoretic.  HENT:     Head: Normocephalic and atraumatic.     Right Ear: Tympanic membrane, ear canal and external ear normal.     Left Ear: Tympanic membrane, ear canal and external ear normal.     Nose: Nose normal. No congestion or rhinorrhea.     Mouth/Throat:     Mouth: Mucous membranes are moist.     Pharynx: Oropharynx is clear. No oropharyngeal exudate or posterior oropharyngeal erythema.  Eyes:     General: No scleral icterus.       Right eye: No discharge.        Left eye: No discharge.     Extraocular Movements: Extraocular movements intact.     Conjunctiva/sclera: Conjunctivae normal.     Pupils: Pupils are equal, round, and reactive to light.  Cardiovascular:     Rate and Rhythm: Normal rate and regular rhythm.     Pulses: Normal pulses.     Heart sounds: Normal heart sounds. No murmur heard.   No friction rub. No gallop.  Pulmonary:     Effort: Pulmonary effort is normal. No respiratory distress.     Breath sounds: Normal breath sounds. No stridor. No wheezing, rhonchi or rales.  Chest:     Chest wall: No tenderness.  Musculoskeletal:        General: Normal range of motion.     Cervical back: Normal range of motion and neck supple.  Skin:    General: Skin is warm and dry.     Capillary Refill: Capillary refill takes less than 2 seconds.     Coloration: Skin is not jaundiced or pale.     Findings: No bruising, erythema, lesion or rash.  Neurological:     General: No focal deficit present.     Mental Status: She is alert and oriented to person, place, and time. Mental status is at baseline.  Psychiatric:        Mood and Affect: Mood normal.        Behavior: Behavior normal.        Thought Content: Thought content normal.        Judgment: Judgment normal.    Results for orders placed or performed in visit on 10/24/21  Insulin, random  Result Value Ref Range   INSULIN  21.9 2.6 - 24.9 uIU/mL  Lipid Panel With LDL/HDL Ratio  Result Value Ref Range   Cholesterol, Total 265 (H) 100 - 199 mg/dL   Triglycerides 338 (H) 0 - 149 mg/dL   HDL 48 >25 mg/dL   VLDL Cholesterol Cal 43 (H) 5 - 40 mg/dL   LDL Chol Calc (NIH) 053 (H) 0 - 99 mg/dL   LDL/HDL Ratio 3.6 (H) 0.0 - 3.2 ratio  VITAMIN D 25 Hydroxy (Vit-D Deficiency, Fractures)  Result Value Ref Range   Vit D, 25-Hydroxy 25.7 (L) 30.0 - 100.0  ng/mL      Assessment & Plan:   Problem List Items Addressed This Visit       Endocrine   Hypothyroidism    Feeling well on medicine. Rechecking labs today. Await results. Treat as needed.       Relevant Orders   TSH     Other   Depression, recurrent (HCC)    Feeling better. Continue current regimen. Continue to monitor.       Low back pain    Will continue PT. If still not better in 1 month, consider MRI.       Hyperlipidemia    Medical weight management recommends calcium score for patient. She was formerly seeing Dr. Mariah MillingGollan- will see about getting her back in.       Other Visit Diagnoses     Fever, unspecified fever cause    -  Primary   Flu and Strep negative. Awaiting COVID. Will treat symptomatically. Declines steroid. Call with any concerns.    Relevant Orders   Rapid Strep Screen ([facility] ONLY)   Veritor Flu A/B Waived   Novel Coronavirus, NAA ([facility])        Follow up plan: Return in about 4 weeks (around 12/10/2021).

## 2021-11-12 NOTE — Assessment & Plan Note (Signed)
Medical weight management recommends calcium score for patient. She was formerly seeing Dr. Mariah Milling- will see about getting her back in.

## 2021-11-12 NOTE — Assessment & Plan Note (Signed)
Feeling well on medicine. Rechecking labs today. Await results. Treat as needed.

## 2021-11-12 NOTE — Telephone Encounter (Signed)
-----   Message from Dorcas Carrow, DO sent at 11/12/2021  3:20 PM EST ----- Weight loss clinic wants her to have CT for calcium risk score- she was seeing Dr. Mariah Milling, but has not seen him since 2019- can we see if they need a new referral or if we can just schedule?

## 2021-11-12 NOTE — Telephone Encounter (Signed)
Per receptionist at [place] office. Patient would be considered a new patient and would have to have a new referral sent over to their office. Per Dr.Johnson she will send over new referral. Patient was scheduled for 12/13/21 at 9:00 am. Verbalized understanding.

## 2021-11-12 NOTE — Assessment & Plan Note (Signed)
Will continue PT. If still not better in 1 month, consider MRI.

## 2021-11-12 NOTE — Addendum Note (Signed)
Addended by: Dorcas Carrow on: 11/12/2021 03:51 PM   Modules accepted: Orders

## 2021-11-13 ENCOUNTER — Other Ambulatory Visit: Payer: Self-pay | Admitting: Family Medicine

## 2021-11-13 DIAGNOSIS — E038 Other specified hypothyroidism: Secondary | ICD-10-CM

## 2021-11-13 LAB — SARS-COV-2, NAA 2 DAY TAT

## 2021-11-13 LAB — TSH: TSH: 0.975 u[IU]/mL (ref 0.450–4.500)

## 2021-11-13 LAB — NOVEL CORONAVIRUS, NAA: SARS-CoV-2, NAA: DETECTED — AB

## 2021-11-13 MED ORDER — LEVOTHYROXINE SODIUM 137 MCG PO TABS: 137.0000 ug | ORAL_TABLET | Freq: Every day | ORAL | 3 refills | Status: DC

## 2021-11-14 ENCOUNTER — Telehealth: Payer: Self-pay | Admitting: Family Medicine

## 2021-11-14 ENCOUNTER — Other Ambulatory Visit: Payer: Self-pay | Admitting: Family Medicine

## 2021-11-14 MED ORDER — HYDROCOD POLI-CHLORPHE POLI ER 10-8 MG/5ML PO SUER: 5.0000 mL | Freq: Two times a day (BID) | ORAL | 0 refills | Status: DC | PRN

## 2021-11-14 MED ORDER — MOLNUPIRAVIR EUA 200MG CAPSULE: 4.0000 | ORAL_CAPSULE | Freq: Two times a day (BID) | ORAL | 0 refills | Status: AC

## 2021-11-14 MED ORDER — BENZONATATE 200 MG PO CAPS: 200.0000 mg | ORAL_CAPSULE | Freq: Two times a day (BID) | ORAL | 0 refills | Status: DC | PRN

## 2021-11-14 NOTE — Telephone Encounter (Signed)
Patient was seen in the office on 11/12/21. She is requesting some cough medication because her cough is not any better.

## 2021-11-14 NOTE — Telephone Encounter (Signed)
Please let her know that she tested positive for COVID so I've sent medicine to her pharmacy. Thanks!

## 2021-11-14 NOTE — Telephone Encounter (Signed)
Spoke with patient and notified her of recent Covid results and to let her know Dr.Johnson has sent over her medication to local pharmacy. Patient verbalized understanding.

## 2021-11-15 LAB — RAPID STREP SCREEN (MED CTR MEBANE ONLY): Strep Gp A Ag, IA W/Reflex: NEGATIVE

## 2021-11-15 LAB — CULTURE, GROUP A STREP: Strep A Culture: NEGATIVE

## 2021-11-15 LAB — VERITOR FLU A/B WAIVED
Influenza A: NEGATIVE
Influenza B: NEGATIVE

## 2021-11-26 ENCOUNTER — Ambulatory Visit (INDEPENDENT_AMBULATORY_CARE_PROVIDER_SITE_OTHER): Payer: Medicare Other | Admitting: Bariatrics

## 2021-11-26 ENCOUNTER — Other Ambulatory Visit: Payer: Self-pay

## 2021-11-26 ENCOUNTER — Encounter (INDEPENDENT_AMBULATORY_CARE_PROVIDER_SITE_OTHER): Payer: Self-pay | Admitting: Bariatrics

## 2021-11-26 VITALS — BP 124/84 | HR 76 | Temp 98.3°F | Ht 64.0 in | Wt 207.0 lb

## 2021-11-26 DIAGNOSIS — E559 Vitamin D deficiency, unspecified: Secondary | ICD-10-CM

## 2021-11-26 DIAGNOSIS — E669 Obesity, unspecified: Secondary | ICD-10-CM

## 2021-11-26 DIAGNOSIS — Z6835 Body mass index (BMI) 35.0-35.9, adult: Secondary | ICD-10-CM

## 2021-11-26 DIAGNOSIS — E782 Mixed hyperlipidemia: Secondary | ICD-10-CM

## 2021-11-26 DIAGNOSIS — F5089 Other specified eating disorder: Secondary | ICD-10-CM

## 2021-11-26 MED ORDER — BUPROPION HCL ER (SR) 150 MG PO TB12: 150.0000 mg | ORAL_TABLET | Freq: Every day | ORAL | 0 refills | Status: DC

## 2021-11-26 MED ORDER — VITAMIN D (ERGOCALCIFEROL) 1.25 MG (50000 UNIT) PO CAPS: 50000.0000 [IU] | ORAL_CAPSULE | ORAL | 0 refills | Status: DC

## 2021-11-27 NOTE — Progress Notes (Signed)
Chief Complaint:   OBESITY Emily [name] is here to discuss her progress with her obesity treatment plan along with follow-up of her obesity related diagnoses. Emily [name] is on the Category 2 Plan and states she is following her eating plan approximately 100% of the time. Emily [name] states she is doing 0 minutes 0 times per week.  Today's visit was #: 3 Starting weight: 218 Starting date: 10/24/2021 Today's weight: 207 lbs Today's date: 11/26/2021 Total lbs lost to date: 11 lbs Total lbs lost since last in-office visit: 6 lbs  Interim History: Maliea is down 6 lbs since her last visit.  Subjective:   1. Vitamin D insufficiency Emily [name] is taking her medications as directed.  2. Mixed hyperlipidemia Emily [name] is currently not on medications.  3. Other disorder of eating Emily [name] is currently not on medications.   Assessment/Plan:   1. Vitamin D insufficiency Low Vitamin D level contributes to fatigue and are associated with obesity, breast, and colon cancer. Emily [name] will follow-up for routine testing of Vitamin D, at least 2-3 times per year to avoid over-replacement.   2. Mixed hyperlipidemia Cardiovascular risk and specific lipid/LDL goals reviewed.  Emily [name] will continue the plan and she will increase activities. We discussed several lifestyle modifications today and Emily [name] will continue to work on diet, exercise and weight loss efforts. Orders and follow up as documented in patient record.   Counseling Intensive lifestyle modifications are the first line treatment for this issue. Dietary changes: Increase soluble fiber. Decrease simple carbohydrates. Exercise changes: Moderate to vigorous-intensity aerobic activity 150 minutes per week if tolerated. Lipid-lowering medications: see documented in medical record.  3. Other disorder of eating Emily [name] agrees to start Wellbutrin SR 150 mg for 1 month with no refills. Behavior modification techniques were discussed today to help Emily [name] deal with her emotional/non-hunger  eating behaviors.  Orders and follow up as documented in patient record.   - buPROPion (WELLBUTRIN SR) 150 MG 12 hr tablet; Take 1 tablet (150 mg total) by mouth daily.  Dispense: 30 tablet; Refill: 0  4. Obesity, current BMI 35.6 Emily [name] is currently in the action stage of change. As such, her goal is to continue with weight loss efforts. She has agreed to the Category 2 Plan.   Emily [name] will continue to adhere closely to the plan and she will continue meal planning.  Exercise goals: No exercise has been prescribed at this time.  Behavioral modification strategies: increasing lean protein intake, decreasing simple carbohydrates, increasing vegetables, increasing water intake, decreasing eating out, no skipping meals, meal planning and cooking strategies, keeping healthy foods in the home, and planning for success.  Emily [name] has agreed to follow-up with our clinic in 2 weeks. She was informed of the importance of frequent follow-up visits to maximize her success with intensive lifestyle modifications for her multiple health conditions.   Objective:   Blood pressure 124/84, pulse 76, temperature 98.3 F (36.8 C), height 5\' 4"  (1.626 m), weight 207 lb (93.9 kg), SpO2 95 %. Body mass index is 35.53 kg/m.  General: Cooperative, alert, well developed, in no acute distress. HEENT: Conjunctivae and lids unremarkable. Cardiovascular: Regular rhythm.  Lungs: Normal work of breathing. Neurologic: No focal deficits.   Lab Results  Component Value Date   CREATININE 0.72 10/15/2021   BUN 15 10/15/2021   NA 141 10/15/2021   K 4.0 10/15/2021   CL 100 10/15/2021   CO2 27 10/15/2021   Lab Results  Component Value Date   ALT 25 10/15/2021  AST 22 10/15/2021   ALKPHOS 96 10/15/2021   BILITOT 0.3 10/15/2021   Lab Results  Component Value Date   HGBA1C 5.9 (H) 10/15/2021   HGBA1C 5.5 01/21/2021   HGBA1C 5.6 02/28/2020   HGBA1C 5.6 06/07/2019   HGBA1C 5.7 (H) 11/17/2018   Lab Results  Component  Value Date   INSULIN 21.9 10/24/2021   INSULIN 33.8 (H) 11/17/2018   Lab Results  Component Value Date   TSH 0.975 11/12/2021   Lab Results  Component Value Date   CHOL 265 (H) 10/24/2021   HDL 48 10/24/2021   LDLCALC 174 (H) 10/24/2021   TRIG 227 (H) 10/24/2021   Lab Results  Component Value Date   VD25OH 25.7 (L) 10/24/2021   VD25OH 30.6 10/15/2021   VD25OH 23.1 (L) 02/28/2020   Lab Results  Component Value Date   WBC 8.5 10/15/2021   HGB 14.0 10/15/2021   HCT 40.6 10/15/2021   MCV 90 10/15/2021   PLT 298 10/15/2021   No results found for: IRON, TIBC, FERRITIN  Attestation Statements:   Reviewed by clinician on day of visit: allergies, medications, problem list, medical history, surgical history, family history, social history, and previous encounter notes.  I, Jackson Latino, RMA, am acting as Energy manager for [employer], DO.  I have reviewed the above documentation for accuracy and completeness, and I agree with the above. Corinna Capra, DO

## 2021-12-02 ENCOUNTER — Other Ambulatory Visit: Payer: Self-pay | Admitting: Family Medicine

## 2021-12-02 DIAGNOSIS — I10 Essential (primary) hypertension: Secondary | ICD-10-CM

## 2021-12-02 NOTE — Telephone Encounter (Signed)
HCTZ, Cozaar, Effexor 150mg  all have newer rx. Effexor XR 75mg  dose inconsistent with current med list.

## 2021-12-02 NOTE — Telephone Encounter (Signed)
Meds refused due to HCTZ, Cozaar, and Effexor XR 150mg  all are older rx, has newer rx of all three meds. Effexor XR 75 refused due to dose inconsistent with current med list.  Requested Prescriptions  Pending Prescriptions Disp Refills   hydrochlorothiazide (HYDRODIURIL) 25 MG tablet [Pharmacy Med Name: HYDROCHLOROTHIAZIDE TABS 25MG ] 90 tablet 3    Sig: TAKE 1 TABLET DAILY     Cardiovascular: Diuretics - Thiazide Passed - 12/02/2021  1:17 AM      Passed - Cr in normal range and within 180 days    Creatinine  Date Value Ref Range Status  10/24/2014 0.76 0.60 - 1.30 mg/dL Final   Creatinine, Ser  Date Value Ref Range Status  10/15/2021 0.72 0.57 - 1.00 mg/dL Final          Passed - K in normal range and within 180 days    Potassium  Date Value Ref Range Status  10/15/2021 4.0 3.5 - 5.2 mmol/L Final  10/24/2014 3.9 3.5 - 5.1 mmol/L Final          Passed - Na in normal range and within 180 days    Sodium  Date Value Ref Range Status  10/15/2021 141 134 - 144 mmol/L Final  10/24/2014 139 136 - 145 mmol/L Final          Passed - Last BP in normal range    BP Readings from Last 1 Encounters:  11/26/21 124/84          Passed - Valid encounter within last 6 months    Recent Outpatient Visits           2 weeks ago Fever, unspecified fever cause   [facility] [place], Megan P, DO   1 month ago Other specified hypothyroidism   [facility] [place], Megan P, DO   2 months ago Exposure to the flu   [facility], Megan P, DO   8 months ago Cervical radiculopathy   [facility], Megan P, DO   9 months ago Routine general medical examination at a health care facility   [facility] [place], [place], DO       Future Appointments             In 1 week [place], [place] yan, DO [facility], [facility]   In 1 week Gollan, Oralia Rud, MD [facility] [place], [facility]              losartan (COZAAR) 100 MG tablet [Pharmacy Med Name: LOSARTAN TABS 100MG ] 90 tablet 3    Sig: TAKE 1 TABLET DAILY     Cardiovascular:  Angiotensin Receptor Blockers Passed - 12/02/2021  1:17 AM      Passed - Cr in normal range and within 180 days    Creatinine  Date Value Ref Range Status  10/24/2014 0.76 0.60 - 1.30 mg/dL Final   Creatinine, Ser  Date Value Ref Range Status  10/15/2021 0.72 0.57 - 1.00 mg/dL Final          Passed - K in normal range and within 180 days    Potassium  Date Value Ref Range Status  10/15/2021 4.0 3.5 - 5.2 mmol/L Final  10/24/2014 3.9 3.5 - 5.1 mmol/L Final          Passed - Patient is not pregnant      Passed - Last BP in normal range    BP Readings from Last 1 Encounters:  11/26/21 124/84  Passed - Valid encounter within last 6 months    Recent Outpatient Visits           2 weeks ago Fever, unspecified fever cause   [facility] [place], Megan P, DO   1 month ago Other specified hypothyroidism   [facility] [place], [place], DO   2 months ago Exposure to the flu   [employer], Megan P, DO   8 months ago Cervical radiculopathy   [facility] [place], Megan P, DO   9 months ago Routine general medical examination at a health care facility   [facility] [place], Oralia Rud, DO       Future Appointments             In 1 week Laural [place]es, Oralia Rud, DO [facility], [facility]   In 1 week Mariah Milling, Tollie Pizza, MD [facility] [place], [facility]             venlafaxine XR (EFFEXOR-XR) 150 MG 24 hr capsule [Pharmacy Med Name: VENLAFAXINE HCL ER CAPS 150MG ] 90 capsule 3    Sig: TAKE 1 CAPSULE DAILY WITH BREAKFAST. TO BE TAKEN WITH THE 75 MG FOR 225 MG TOTAL.     Psychiatry: Antidepressants - SNRI - desvenlafaxine & venlafaxine Failed - 12/02/2021  1:17 AM      Failed - Lipid Panel in normal range within the last 12 months    Cholesterol, Total  Date  Value Ref Range Status  10/24/2021 265 (H) 100 - 199 mg/dL Final   LDL Chol Calc (NIH)  Date Value Ref Range Status  10/24/2021 174 (H) 0 - 99 mg/dL Final   HDL  Date Value Ref Range Status  10/24/2021 48 >39 mg/dL Final   Triglycerides  Date Value Ref Range Status  10/24/2021 227 (H) 0 - 149 mg/dL Final         Passed - Cr in normal range and within 360 days    Creatinine  Date Value Ref Range Status  10/24/2014 0.76 0.60 - 1.30 mg/dL Final   Creatinine, Ser  Date Value Ref Range Status  10/15/2021 0.72 0.57 - 1.00 mg/dL Final          Passed - Completed PHQ-2 or PHQ-9 in the last 360 days      Passed - Last BP in normal range    BP Readings from Last 1 Encounters:  11/26/21 124/84          Passed - Valid encounter within last 6 months    Recent Outpatient Visits           2 weeks ago Fever, unspecified fever cause   [facility] [place], Megan P, DO   1 month ago Other specified hypothyroidism   [facility] [place], [place], DO   2 months ago Exposure to the flu   [employer], Megan P, DO   8 months ago Cervical radiculopathy   [facility] [facility], Megan P, DO   9 months ago Routine general medical examination at a health care facility   [facility] [place], [place], DO       Future Appointments             In 1 week Laural Benes, Oralia Rud, DO [facility], [facility]   In 1 week Gollan, Tollie Pizza, MD [facility], [facility]             venlafaxine XR [facility])  75 MG 24 hr capsule [Pharmacy Med Name: VENLAFAXINE HCL ER CAPS 75MG ] 90 capsule 3    Sig: TAKE 1 CAPSULE DAILY WITH BREAKFAST. TO BE TAKEN WITH 150 MG FOR 225 MG TOTAL     Psychiatry: Antidepressants - SNRI - desvenlafaxine & venlafaxine Failed - 12/02/2021  1:17 AM      Failed - Lipid Panel in normal range within the last 12 months    Cholesterol, Total  Date Value Ref Range Status  10/24/2021 265  (H) 100 - 199 mg/dL Final   LDL Chol Calc (NIH)  Date Value Ref Range Status  10/24/2021 174 (H) 0 - 99 mg/dL Final   HDL  Date Value Ref Range Status  10/24/2021 48 >39 mg/dL Final   Triglycerides  Date Value Ref Range Status  10/24/2021 227 (H) 0 - 149 mg/dL Final         Passed - Cr in normal range and within 360 days    Creatinine  Date Value Ref Range Status  10/24/2014 0.76 0.60 - 1.30 mg/dL Final   Creatinine, Ser  Date Value Ref Range Status  10/15/2021 0.72 0.57 - 1.00 mg/dL Final          Passed - Completed PHQ-2 or PHQ-9 in the last 360 days      Passed - Last BP in normal range    BP Readings from Last 1 Encounters:  11/26/21 124/84          Passed - Valid encounter within last 6 months    Recent Outpatient Visits           2 weeks ago Fever, unspecified fever cause   [facility] [place], Megan P, DO   1 month ago Other specified hypothyroidism   [facility] [place], [place], DO   2 months ago Exposure to the flu   [place] yan, [place], DO   8 months ago Cervical radiculopathy   [facility] [place], Megan P, DO   9 months ago Routine general medical examination at a health care facility   [facility] [place], [place], DO       Future Appointments             In 1 week Penn yan, Laural Benes, DO [facility], [facility]   In 1 week Gollan, [employer], MD [facility], [facility]

## 2021-12-06 ENCOUNTER — Other Ambulatory Visit: Payer: Self-pay

## 2021-12-06 ENCOUNTER — Ambulatory Visit
Admission: RE | Admit: 2021-12-06 | Discharge: 2021-12-06 | Disposition: A | Discharge status: Home / Self Care | Payer: Medicare Other | Source: Ambulatory Visit | Attending: Family Medicine | Admitting: Family Medicine

## 2021-12-06 DIAGNOSIS — Z1231 Encounter for screening mammogram for malignant neoplasm of breast: Secondary | ICD-10-CM | POA: Insufficient documentation

## 2021-12-10 ENCOUNTER — Other Ambulatory Visit: Payer: Self-pay

## 2021-12-10 ENCOUNTER — Ambulatory Visit: Payer: BC Managed Care – PPO | Admitting: Family Medicine

## 2021-12-10 ENCOUNTER — Encounter: Payer: Self-pay | Admitting: Family Medicine

## 2021-12-10 VITALS — BP 128/76 | HR 76 | Temp 98.5°F | Wt 209.4 lb

## 2021-12-10 DIAGNOSIS — M5440 Lumbago with sciatica, unspecified side: Secondary | ICD-10-CM

## 2021-12-10 DIAGNOSIS — G629 Polyneuropathy, unspecified: Secondary | ICD-10-CM | POA: Insufficient documentation

## 2021-12-10 DIAGNOSIS — G6289 Other specified polyneuropathies: Secondary | ICD-10-CM | POA: Diagnosis not present

## 2021-12-10 DIAGNOSIS — G8929 Other chronic pain: Secondary | ICD-10-CM | POA: Diagnosis not present

## 2021-12-10 MED ORDER — LOSARTAN POTASSIUM 100 MG PO TABS
100.0000 mg | ORAL_TABLET | Freq: Every day | ORAL | 1 refills | Status: DC
Start: 2021-12-10 — End: 2022-03-13

## 2021-12-10 MED ORDER — NORTRIPTYLINE HCL 25 MG PO CAPS: 25.0000 mg | ORAL_CAPSULE | Freq: Every day | ORAL | 3 refills | Status: DC

## 2021-12-10 NOTE — Assessment & Plan Note (Signed)
Better. Has numbness in her feet. Will get back into PT. If not improving in 4-6 weeks, will get her into neurology/get MRI.

## 2021-12-10 NOTE — Assessment & Plan Note (Signed)
Labs normal last visit. ?due to her back pain. Will get her back into PT and start nortriptyline. If not getting better in 4-6 weeks, consider MRI vs referral to neurology.

## 2021-12-10 NOTE — Progress Notes (Signed)
BP 128/76    Pulse 76    Temp 98.5 F (36.9 C)    Wt 209 lb 6.4 oz (95 kg)    SpO2 96%    BMI 35.94 kg/m    Subjective:    Patient ID: Emily [name], female    DOB: [birth year], 61 y.o.   MRN: [id number]  HPI: Emily [name] is a 61 y.o. female  Chief Complaint  Patient presents with   Back Pain    Follow up patient states she is feeling better   Numbness    Patient states she continues to have numbness and tingling in both feet. Patient had nerve test scheduled a few months ago but missed it and would like to know where to get that done at.    NEUROPATHY Neuropathy status: stable  Satisfied with current treatment?: no Medication side effects: yes Medication compliance:  was on gabapentin and stopped due to fatigue Location: bilateral legs and feet Pain: yes Severity: moderate  Quality:  aching, sore and numb Frequency: constant Bilateral: yes Symmetric: yes Numbness: yes Decreased sensation: yes Weakness: no Context: stable Treatments attempted: gabapentin   Relevant past medical, surgical, family and social history reviewed and updated as indicated. Interim medical history since our last visit reviewed. Allergies and medications reviewed and updated.  Review of Systems  Constitutional: Negative.   Respiratory: Negative.    Cardiovascular: Negative.   Gastrointestinal: Negative.   Musculoskeletal: Negative.   Neurological: Negative.   Psychiatric/Behavioral: Negative.     Per HPI unless specifically indicated above     Objective:    BP 128/76    Pulse 76    Temp 98.5 F (36.9 C)    Wt 209 lb 6.4 oz (95 kg)    SpO2 96%    BMI 35.94 kg/m   Wt Readings from Last 3 Encounters:  12/10/21 209 lb 6.4 oz (95 kg)  11/26/21 207 lb (93.9 kg)  11/12/21 217 lb (98.4 kg)    Physical Exam Vitals and nursing note reviewed.  Constitutional:      General: She is not in acute distress.    Appearance: Normal appearance. She is not ill-appearing, toxic-appearing or  diaphoretic.  HENT:     Head: Normocephalic and atraumatic.     Right Ear: External ear normal.     Left Ear: External ear normal.     Nose: Nose normal.     Mouth/Throat:     Mouth: Mucous membranes are moist.     Pharynx: Oropharynx is clear.  Eyes:     General: No scleral icterus.       Right eye: No discharge.        Left eye: No discharge.     Extraocular Movements: Extraocular movements intact.     Conjunctiva/sclera: Conjunctivae normal.     Pupils: Pupils are equal, round, and reactive to light.  Cardiovascular:     Rate and Rhythm: Normal rate and regular rhythm.     Pulses: Normal pulses.     Heart sounds: Normal heart sounds. No murmur heard.   No friction rub. No gallop.  Pulmonary:     Effort: Pulmonary effort is normal. No respiratory distress.     Breath sounds: Normal breath sounds. No stridor. No wheezing, rhonchi or rales.  Chest:     Chest wall: No tenderness.  Musculoskeletal:        General: Normal range of motion.     Cervical back: Normal range of motion and neck  supple.  Skin:    General: Skin is warm and dry.     Capillary Refill: Capillary refill takes less than 2 seconds.     Coloration: Skin is not jaundiced or pale.     Findings: No bruising, erythema, lesion or rash.  Neurological:     General: No focal deficit present.     Mental Status: She is alert and oriented to person, place, and time. Mental status is at baseline.  Psychiatric:        Mood and Affect: Mood normal.        Behavior: Behavior normal.        Thought Content: Thought content normal.        Judgment: Judgment normal.    Results for orders placed or performed in visit on 11/12/21  Rapid Strep Screen ([facility] ONLY)   Specimen: Other   Other  Result Value Ref Range   Strep Gp A Ag, IA W/Reflex Negative Negative  Novel Coronavirus, NAA ([facility])   Specimen: Nasopharyngeal(NP) swabs in vial transport medium  Result Value Ref Range   SARS-CoV-2, NAA Detected (A) Not  Detected  Culture, Group A Strep   Other  Result Value Ref Range   Strep A Culture Negative   SARS-COV-2, NAA 2 DAY TAT  Result Value Ref Range   SARS-CoV-2, NAA 2 DAY TAT Performed   Veritor Flu A/B Waived  Result Value Ref Range   Influenza A Negative Negative   Influenza B Negative Negative  TSH  Result Value Ref Range   TSH 0.975 0.450 - 4.500 uIU/mL      Assessment & Plan:   Problem List Items Addressed This Visit       Nervous and Auditory   Peripheral neuropathy    Labs normal last visit. ?due to her back pain. Will get her back into PT and start nortriptyline. If not getting better in 4-6 weeks, consider MRI vs referral to neurology.      Relevant Medications   nortriptyline (PAMELOR) 25 MG capsule     Other   Low back pain - Primary    Better. Has numbness in her feet. Will get back into PT. If not improving in 4-6 weeks, will get her into neurology/get MRI.        Follow up plan: Return in about 6 weeks (around 01/21/2022).

## 2021-12-13 ENCOUNTER — Ambulatory Visit: Payer: BC Managed Care – PPO | Admitting: Cardiovascular Disease

## 2021-12-13 NOTE — Progress Notes (Unsigned)
Cardiology Office Note  Date:  12/13/2021   ID:  Emily [name], Emily [name] 11-27-1960, MRN [id number]  PCP:  Dorcas Carrow, DO   No chief complaint on file.   HPI:  Emily [name] is a 61 year-old woman with past medical history of Chronic chest pain,  Fatigue, depression Morbid obesity Hypertension Shortness of breath on exertion, deconditioning Cardiac catheterization January 2017 normal coronary arteries Who presents for symptoms of fatigue, shortness of breath  Last seen in clinic by myself October 2019    Last seen in clinic February 2017  Reports significant weight gain over the past several years Feels she is deconditioned, no regular exercise  Having chronic Atypical left rib pain radiating around to her left back Some discomfort with movement Does not feel it is from shingles or her heart  EKG personally reviewed by myself on todays visit Shows normal sinus rhythm with rate 89 bpm no significant ST or T wave changes   PMH:   has a past medical history of Anxiety, Back pain, Chest pain, Constipation, Degenerative disc disease, lumbar, Depression, Essential hypertension, Fatigue, GERD (gastroesophageal reflux disease), Hypothyroidism, Joint pain in fingers of both hands, Morbid obesity (HCC), Myalgia, Neck pain, and SOB (shortness of breath).  PSH:    Past Surgical History:  Procedure Laterality Date   CARDIAC CATHETERIZATION N/A 11/14/2015   Procedure: Left Heart Cath and Coronary Angiography;  Surgeon: Iran Ouch, MD;  Location: [facility];  Service: Cardiovascular;  Laterality: N/A;   CARPAL TUNNEL RELEASE Left 07/03/2015   Procedure: CARPAL TUNNEL RELEASE;  Surgeon: Myra Rude, MD;  Location: [facility] ORS;  Service: Orthopedics;  Laterality: Left;   CARPAL TUNNEL RELEASE Right 09/07/2015   Procedure: CARPAL TUNNEL RELEASE;  Surgeon: Myra Rude, MD;  Location: [facility] ORS;  Service: Orthopedics;  Laterality: Right;   CHOLECYSTECTOMY      COLONOSCOPY WITH PROPOFOL N/A 05/30/2021   Procedure: COLONOSCOPY WITH PROPOFOL;  Surgeon: Pasty Spillers, MD;  Location: [facility] ENDOSCOPY;  Service: Endoscopy;  Laterality: N/A;   KNEE SURGERY Right    TUBAL LIGATION      Current Outpatient Medications  Medication Sig Dispense Refill   buPROPion (WELLBUTRIN SR) 150 MG 12 hr tablet Take 1 tablet (150 mg total) by mouth daily. 30 tablet 0   cyclobenzaprine (FLEXERIL) 10 MG tablet Take 1 tablet (10 mg total) by mouth 3 (three) times daily as needed. 30 tablet 1   hydrochlorothiazide (HYDRODIURIL) 25 MG tablet Take 1 tablet (25 mg total) by mouth daily. 90 tablet 1   levothyroxine (SYNTHROID) 137 MCG tablet Take 1 tablet (137 mcg total) by mouth daily before breakfast. 90 tablet 3   losartan (COZAAR) 100 MG tablet Take 1 tablet (100 mg total) by mouth daily. 90 tablet 1   meloxicam (MOBIC) 7.5 MG tablet TAKE 1 TABLET DAILY 90 tablet 3   nortriptyline (PAMELOR) 25 MG capsule Take 1 capsule (25 mg total) by mouth at bedtime. 30 capsule 3   triamcinolone ointment (KENALOG) 0.5 % Apply 1 application topically 2 (two) times daily. 30 g 0   venlafaxine XR (EFFEXOR-XR) 150 MG 24 hr capsule Take 1 capsule (150 mg total) by mouth daily with breakfast. 90 capsule 1   Vitamin D, Ergocalciferol, (DRISDOL) 1.25 MG (50000 UNIT) CAPS capsule Take 1 capsule (50,000 Units total) by mouth every 7 (seven) days. 4 capsule 0   No current facility-administered medications for this visit.     Allergies:   Patient has no  known allergies.   Social History:  The patient  reports that she quit smoking about 26 years ago. Her smoking use included cigarettes. She has a 2.50 pack-year smoking history. She has never used smokeless tobacco. She reports that she does not drink alcohol and does not use drugs.   Family History:   family history includes Alzheimer's disease in her father; Anxiety disorder in her mother; CAD (age of onset: 68) in her cousin; Cancer in her  paternal uncle; Depression in her mother; Diabetes in her maternal grandmother and paternal grandmother; Heart attack (age of onset: 25) in her father; Heart disease in her father and mother; Heart failure in her mother; Hyperlipidemia in her father; Hypertension in her father and mother; Obesity in her father and mother; Stroke in her mother; Thyroid disease in her mother.    Review of Systems: Review of Systems  Constitutional:  Positive for malaise/fatigue.  Respiratory:  Positive for shortness of breath.   Cardiovascular: Negative.   Gastrointestinal: Negative.   Musculoskeletal:  Positive for back pain.  Neurological: Negative.   Psychiatric/Behavioral: Negative.    All other systems reviewed and are negative.  PHYSICAL EXAM: VS:  There were no vitals taken for this visit. , BMI There is no height or weight on file to calculate BMI. GEN: Well nourished, well developed, in no acute distress , obese HEENT: normal  Neck: no JVD, carotid bruits, or masses Cardiac: RRR; no murmurs, rubs, or gallops,no edema  Respiratory:  clear to auscultation bilaterally, normal work of breathing GI: soft, nontender, nondistended, + BS MS: no deformity or atrophy  Skin: warm and dry, no rash Neuro:  Strength and sensation are intact Psych: euthymic mood, full affect  Recent Labs: 10/15/2021: ALT 25; BUN 15; Creatinine, Ser 0.72; Hemoglobin 14.0; Platelets 298; Potassium 4.0; Sodium 141 11/12/2021: TSH 0.975    Lipid Panel Lab Results  Component Value Date   CHOL 265 (H) 10/24/2021   HDL 48 10/24/2021   LDLCALC 174 (H) 10/24/2021   TRIG 227 (H) 10/24/2021      Wt Readings from Last 3 Encounters:  12/10/21 209 lb 6.4 oz (95 kg)  11/26/21 207 lb (93.9 kg)  11/12/21 217 lb (98.4 kg)     ASSESSMENT AND PLAN:  Morbid obesity (HCC) We have encouraged continued exercise, careful diet management in an effort to lose weight. She would like to see the wellness clinic in [place] We can  place referral for her if needed, she will let us know My discussion concerning needed dietary changes and need for exercise  Depression, unspecified depression type Likely playing a role in weight gain, fatigue Physical deconditioning  Essential hypertension - Plan: EKG 12-Lead Blood pressure is well controlled on today's visit. No changes made to the medications.  Hypothyroidism, unspecified type Managed by primary care  Atypical chest pain - Plan: EKG 12-Lead Prior cardiac catheterization with no coronary disease Likely secondary to musculoskeletal issue, Likely has a rib out given radiating pain radiating from anterior to the flank to her back in a particular dermatome.  Less likely shingles given no neurologic issue, more of a grabbing pain like muscle cramp  Disposition:   F/U as needed   Total encounter time more than 25 minutes  Greater than 50% was spent in counseling and coordination of care with the patient    No orders of the defined types were placed in this encounter.    Signed, Dossie Arbour, M.D., Ph.D. 12/13/2021  [facility],  [place] [phone]

## 2021-12-15 NOTE — Progress Notes (Unsigned)
Cardiology Office Note  Date:  12/15/2021   ID:  Emily [name], [birth year]in Mar 31, 1961, MRN [id number]  PCP:  Dorcas Carrow, DO   No chief complaint on file.   HPI:  [name] is a 61 year-old woman with past medical history of Chronic chest pain,  Fatigue, depression Morbid obesity Hypertension Shortness of breath on exertion, deconditioning Cardiac catheterization January 2017 normal coronary arteries Who presents for symptoms of fatigue, shortness of breath  Last seen in clinic by myself October 2019    Last seen in clinic February 2017  Reports significant weight gain over the past several years Feels she is deconditioned, no regular exercise  Having chronic Atypical left rib pain radiating around to her left back Some discomfort with movement Does not feel it is from shingles or her heart  EKG personally reviewed by myself on todays visit Shows normal sinus rhythm with rate 89 bpm no significant ST or T wave changes   PMH:   has a past medical history of Anxiety, Back pain, Chest pain, Constipation, Degenerative disc disease, lumbar, Depression, Essential hypertension, Fatigue, GERD (gastroesophageal reflux disease), Hypothyroidism, Joint pain in fingers of both hands, Morbid obesity (HCC), Myalgia, Neck pain, and SOB (shortness of breath).  PSH:    Past Surgical History:  Procedure Laterality Date   CARDIAC CATHETERIZATION N/A 11/14/2015   Procedure: Left Heart Cath and Coronary Angiography;  Surgeon: Iran Ouch, MD;  Location: [facility];  Service: Cardiovascular;  Laterality: N/A;   CARPAL TUNNEL RELEASE Left 07/03/2015   Procedure: CARPAL TUNNEL RELEASE;  Surgeon: Myra Rude, MD;  Location: [facility];  Service: Orthopedics;  Laterality: Left;   CARPAL TUNNEL RELEASE Right 09/07/2015   Procedure: CARPAL TUNNEL RELEASE;  Surgeon: Myra Rude, MD;  Location: [facility];  Service: Orthopedics;  Laterality: Right;   CHOLECYSTECTOMY      COLONOSCOPY WITH PROPOFOL N/A 05/30/2021   Procedure: COLONOSCOPY WITH PROPOFOL;  Surgeon: Pasty Spillers, MD;  Location: [facility];  Service: Endoscopy;  Laterality: N/A;   KNEE SURGERY Right    TUBAL LIGATION      Current Outpatient Medications  Medication Sig Dispense Refill   buPROPion (WELLBUTRIN SR) 150 MG 12 hr tablet Take 1 tablet (150 mg total) by mouth daily. 30 tablet 0   cyclobenzaprine (FLEXERIL) 10 MG tablet Take 1 tablet (10 mg total) by mouth 3 (three) times daily as needed. 30 tablet 1   hydrochlorothiazide (HYDRODIURIL) 25 MG tablet Take 1 tablet (25 mg total) by mouth daily. 90 tablet 1   levothyroxine (SYNTHROID) 137 MCG tablet Take 1 tablet (137 mcg total) by mouth daily before breakfast. 90 tablet 3   losartan (COZAAR) 100 MG tablet Take 1 tablet (100 mg total) by mouth daily. 90 tablet 1   meloxicam (MOBIC) 7.5 MG tablet TAKE 1 TABLET DAILY 90 tablet 3   nortriptyline (PAMELOR) 25 MG capsule Take 1 capsule (25 mg total) by mouth at bedtime. 30 capsule 3   triamcinolone ointment (KENALOG) 0.5 % Apply 1 application topically 2 (two) times daily. 30 g 0   venlafaxine XR (EFFEXOR-XR) 150 MG 24 hr capsule Take 1 capsule (150 mg total) by mouth daily with breakfast. 90 capsule 1   Vitamin D, Ergocalciferol, (DRISDOL) 1.25 MG (50000 UNIT) CAPS capsule Take 1 capsule (50,000 Units total) by mouth every 7 (seven) days. 4 capsule 0   No current facility-administered medications for this visit.     Allergies:   Patient has no  known allergies.   Social History:  The patient  reports that she quit smoking about 26 years ago. Her smoking use included cigarettes. She has a 2.50 pack-year smoking history. She has never used smokeless tobacco. She reports that she does not drink alcohol and does not use drugs.   Family History:   family history includes Alzheimer's disease in her father; Anxiety disorder in her mother; CAD (age of onset: 40) in her cousin; Cancer in her  paternal uncle; Depression in her mother; Diabetes in her maternal grandmother and paternal grandmother; Heart attack (age of onset: 78) in her father; Heart disease in her father and mother; Heart failure in her mother; Hyperlipidemia in her father; Hypertension in her father and mother; Obesity in her father and mother; Stroke in her mother; Thyroid disease in her mother.    Review of Systems: Review of Systems  Constitutional:  Positive for malaise/fatigue.  Respiratory:  Positive for shortness of breath.   Cardiovascular: Negative.   Gastrointestinal: Negative.   Musculoskeletal:  Positive for back pain.  Neurological: Negative.   Psychiatric/Behavioral: Negative.    All other systems reviewed and are negative.  PHYSICAL EXAM: VS:  There were no vitals taken for this visit. , BMI There is no height or weight on file to calculate BMI. GEN: Well nourished, well developed, in no acute distress , obese HEENT: normal  Neck: no JVD, carotid bruits, or masses Cardiac: RRR; no murmurs, rubs, or gallops,no edema  Respiratory:  clear to auscultation bilaterally, normal work of breathing GI: soft, nontender, nondistended, + BS MS: no deformity or atrophy  Skin: warm and dry, no rash Neuro:  Strength and sensation are intact Psych: euthymic mood, full affect  Recent Labs: 10/15/2021: ALT 25; BUN 15; Creatinine, Ser 0.72; Hemoglobin 14.0; Platelets 298; Potassium 4.0; Sodium 141 11/12/2021: TSH 0.975    Lipid Panel Lab Results  Component Value Date   CHOL 265 (H) 10/24/2021   HDL 48 10/24/2021   LDLCALC 174 (H) 10/24/2021   TRIG 227 (H) 10/24/2021      Wt Readings from Last 3 Encounters:  12/10/21 209 lb 6.4 oz (95 kg)  11/26/21 207 lb (93.9 kg)  11/12/21 217 lb (98.4 kg)     ASSESSMENT AND PLAN:  Morbid obesity (HCC) We have encouraged continued exercise, careful diet management in an effort to lose weight. She would like to see the wellness clinic in [place] We can  place referral for her if needed, she will let us know My discussion concerning needed dietary changes and need for exercise  Depression, unspecified depression type Likely playing a role in weight gain, fatigue Physical deconditioning  Essential hypertension - Plan: EKG 12-Lead Blood pressure is well controlled on today's visit. No changes made to the medications.  Hypothyroidism, unspecified type Managed by primary care  Atypical chest pain - Plan: EKG 12-Lead Prior cardiac catheterization with no coronary disease Likely secondary to musculoskeletal issue, Likely has a rib out given radiating pain radiating from anterior to the flank to her back in a particular dermatome.  Less likely shingles given no neurologic issue, more of a grabbing pain like muscle cramp  Disposition:   F/U as needed   Total encounter time more than 25 minutes  Greater than 50% was spent in counseling and coordination of care with the patient    No orders of the defined types were placed in this encounter.    Signed, Dossie Arbour, M.D., Ph.D. 12/15/2021  [facility],  [place] [phone]

## 2021-12-16 ENCOUNTER — Ambulatory Visit: Payer: BC Managed Care – PPO | Admitting: Cardiovascular Disease

## 2021-12-16 DIAGNOSIS — R0602 Shortness of breath: Secondary | ICD-10-CM

## 2021-12-16 DIAGNOSIS — I10 Essential (primary) hypertension: Secondary | ICD-10-CM

## 2021-12-17 ENCOUNTER — Other Ambulatory Visit: Payer: Self-pay

## 2021-12-17 ENCOUNTER — Encounter (INDEPENDENT_AMBULATORY_CARE_PROVIDER_SITE_OTHER): Payer: Self-pay | Admitting: Bariatrics

## 2021-12-17 ENCOUNTER — Other Ambulatory Visit (INDEPENDENT_AMBULATORY_CARE_PROVIDER_SITE_OTHER): Payer: Self-pay | Admitting: Bariatrics

## 2021-12-17 ENCOUNTER — Ambulatory Visit (INDEPENDENT_AMBULATORY_CARE_PROVIDER_SITE_OTHER): Payer: Medicare Other | Admitting: Bariatrics

## 2021-12-17 VITALS — BP 137/82 | HR 80 | Temp 97.5°F | Ht 64.0 in | Wt 202.0 lb

## 2021-12-17 DIAGNOSIS — E782 Mixed hyperlipidemia: Secondary | ICD-10-CM | POA: Diagnosis not present

## 2021-12-17 DIAGNOSIS — Z6834 Body mass index (BMI) 34.0-34.9, adult: Secondary | ICD-10-CM | POA: Diagnosis not present

## 2021-12-17 DIAGNOSIS — F5089 Other specified eating disorder: Secondary | ICD-10-CM

## 2021-12-17 DIAGNOSIS — E65 Localized adiposity: Secondary | ICD-10-CM

## 2021-12-17 DIAGNOSIS — E669 Obesity, unspecified: Secondary | ICD-10-CM

## 2021-12-17 DIAGNOSIS — E559 Vitamin D deficiency, unspecified: Secondary | ICD-10-CM

## 2021-12-17 DIAGNOSIS — E66812 Obesity, class 2: Secondary | ICD-10-CM

## 2021-12-17 MED ORDER — FLUCONAZOLE 150 MG PO TABS: 150.0000 mg | ORAL_TABLET | Freq: Once | ORAL | 2 refills | Status: AC

## 2021-12-17 MED ORDER — VITAMIN D (ERGOCALCIFEROL) 1.25 MG (50000 UNIT) PO CAPS: 50000.0000 [IU] | ORAL_CAPSULE | ORAL | 0 refills | Status: DC

## 2021-12-17 NOTE — Telephone Encounter (Signed)
Dr.Brown 

## 2021-12-18 ENCOUNTER — Encounter (INDEPENDENT_AMBULATORY_CARE_PROVIDER_SITE_OTHER): Payer: Self-pay | Admitting: Bariatrics

## 2021-12-18 NOTE — Progress Notes (Signed)
Chief Complaint:   OBESITY Emily [name] is here to discuss her progress with her obesity treatment plan along with follow-up of her obesity related diagnoses. Emily [name] is on the Category 2 Plan and states she is following her eating plan approximately 80-85% of the time. Emily [name] states she is doing 0 minutes 0 times per week.  Today's visit was #: 4 Starting weight: 218 lbs Starting date: 10/24/2021 Today's weight: 202 lbs Today's date: 12/17/2021 Total lbs lost to date: 16 lbs Total lbs lost since last in-office visit: 5 lbs  Interim History: Emily [name] is down an additional 5 lbs since her last visit.   Subjective:   1. Vitamin D insufficiency Emily [name] is currently taking Vitamin D.   2. Mixed hyperlipidemia Emily [name] is not currently on medications.   3. Abdominal pannus Emily [name] notes with irritation. She is using Kenalog with prolonged in the past.   4. Other disorder of eating Emily [name] is taking Wellbutrin and states it is helping.  Assessment/Plan:   1. Vitamin D insufficiency Low Vitamin D level contributes to fatigue and are associated with obesity, breast, and colon cancer. We will refill prescription Vitamin D 50,000 IU every week for 1 month with no refills and Emily [name] will follow-up for routine testing of Vitamin D, at least 2-3 times per year to avoid over-replacement.  - Vitamin D, Ergocalciferol, (DRISDOL) 1.25 MG (50000 UNIT) CAPS capsule; Take 1 capsule (50,000 Units total) by mouth every 7 (seven) days.  Dispense: 4 capsule; Refill: 0  2. Mixed hyperlipidemia Cardiovascular risk and specific lipid/LDL goals reviewed.  Emily [name] will have zero trans fats. She will have zero saturated fats except diary, unprocessed meats and dark chocolate. We discussed several lifestyle modifications today and Emily [name] will continue to work on diet, exercise and weight loss efforts. Orders and follow up as documented in patient record.   Counseling Intensive lifestyle modifications are the first line treatment for this  issue. Dietary changes: Increase soluble fiber. Decrease simple carbohydrates. Exercise changes: Moderate to vigorous-intensity aerobic activity 150 minutes per week if tolerated. Lipid-lowering medications: see documented in medical record.  3. Abdominal pannus We will refill Diflucan 150 mg now and repeat if needed with 2 refills.   - fluconazole (DIFLUCAN) 150 MG tablet; Take 1 tablet (150 mg total) by mouth once for 1 dose.  Dispense: 1 tablet; Refill: 2  4. Other disorder of eating Emily [name] will continue taking Wellbutrin. Behavior modification techniques were discussed today to help Emily [name] deal with her emotional/non-hunger eating behaviors.  Orders and follow up as documented in patient record.   5. Obesity, current BMI 34.7 Emily [name] is currently in the action stage of change. As such, her goal is to continue with weight loss efforts. She has agreed to the Category 2 Plan.   Emily [name] will continue to adhere to the plan 80-90%.  Exercise goals: No exercise has been prescribed at this time.  Behavioral modification strategies: increasing lean protein intake, decreasing simple carbohydrates, increasing vegetables, increasing water intake, decreasing eating out, no skipping meals, meal planning and cooking strategies, keeping healthy foods in the home, and planning for success.  Emily [name] has agreed to follow-up with our clinic in 2 weeks. She was informed of the importance of frequent follow-up visits to maximize her success with intensive lifestyle modifications for her multiple health conditions.   Objective:   Blood pressure 137/82, pulse 80, temperature (!) 97.5 F (36.4 C), height 5\' 4"  (1.626 m), weight 202 lb (91.6 kg), SpO2 96 %. Body mass  index is 34.67 kg/m.  General: Cooperative, alert, well developed, in no acute distress. HEENT: Conjunctivae and lids unremarkable. Cardiovascular: Regular rhythm.  Lungs: Normal work of breathing. Neurologic: No focal deficits.   Lab Results   Component Value Date   CREATININE 0.72 10/15/2021   BUN 15 10/15/2021   NA 141 10/15/2021   K 4.0 10/15/2021   CL 100 10/15/2021   CO2 27 10/15/2021   Lab Results  Component Value Date   ALT 25 10/15/2021   AST 22 10/15/2021   ALKPHOS 96 10/15/2021   BILITOT 0.3 10/15/2021   Lab Results  Component Value Date   HGBA1C 5.9 (H) 10/15/2021   HGBA1C 5.5 01/21/2021   HGBA1C 5.6 02/28/2020   HGBA1C 5.6 06/07/2019   HGBA1C 5.7 (H) 11/17/2018   Lab Results  Component Value Date   INSULIN 21.9 10/24/2021   INSULIN 33.8 (H) 11/17/2018   Lab Results  Component Value Date   TSH 0.975 11/12/2021   Lab Results  Component Value Date   CHOL 265 (H) 10/24/2021   HDL 48 10/24/2021   LDLCALC 174 (H) 10/24/2021   TRIG 227 (H) 10/24/2021   Lab Results  Component Value Date   VD25OH 25.7 (L) 10/24/2021   VD25OH 30.6 10/15/2021   VD25OH 23.1 (L) 02/28/2020   Lab Results  Component Value Date   WBC 8.5 10/15/2021   HGB 14.0 10/15/2021   HCT 40.6 10/15/2021   MCV 90 10/15/2021   PLT 298 10/15/2021   No results found for: IRON, TIBC, FERRITIN  Attestation Statements:   Reviewed by clinician on day of visit: allergies, medications, problem list, medical history, surgical history, family history, social history, and previous encounter notes.  I, Emily [name], RMA, am acting as Location manager for [employer], DO.  I have reviewed the above documentation for accuracy and completeness, and I agree with the above. Emily Lesch, DO

## 2022-01-02 ENCOUNTER — Other Ambulatory Visit: Payer: Self-pay | Admitting: Family Medicine

## 2022-01-03 NOTE — Telephone Encounter (Signed)
Requested Prescriptions  ?Pending Prescriptions Disp Refills  ?? venlafaxine XR (EFFEXOR-XR) 150 MG 24 hr capsule [Pharmacy Med Name: VENLAFAXINE HCL ER CAPS 150MG ] 30 capsule 5  ?  Sig: TAKE 1 CAPSULE DAILY WITH BREAKFAST (COURTESY REFILL, NEED OFFICE VISIT)  ?  ? Psychiatry: Antidepressants - SNRI - desvenlafaxine & venlafaxine Failed - 01/02/2022  4:15 PM  ?  ?  Failed - Lipid Panel in normal range within the last 12 months  ?  Cholesterol, Total  ?Date Value Ref Range Status  ?10/24/2021 265 (H) 100 - 199 mg/dL Final  ? ?LDL Chol Calc (NIH)  ?Date Value Ref Range Status  ?10/24/2021 174 (H) 0 - 99 mg/dL Final  ? ?HDL  ?Date Value Ref Range Status  ?10/24/2021 48 >39 mg/dL Final  ? ?Triglycerides  ?Date Value Ref Range Status  ?10/24/2021 227 (H) 0 - 149 mg/dL Final  ? ?  ?  ?  Passed - Cr in normal range and within 360 days  ?  Creatinine  ?Date Value Ref Range Status  ?10/24/2014 0.76 0.60 - 1.30 mg/dL Final  ? ?Creatinine, Ser  ?Date Value Ref Range Status  ?10/15/2021 0.72 0.57 - 1.00 mg/dL Final  ?   ?  ?  Passed - Completed PHQ-2 or PHQ-9 in the last 360 days  ?  ?  Passed - Last BP in normal range  ?  BP Readings from Last 1 Encounters:  ?12/17/21 137/82  ?   ?  ?  Passed - Valid encounter within last 6 months  ?  Recent Outpatient Visits   ?      ? 3 weeks ago Chronic low back pain with sciatica, sciatica laterality unspecified, unspecified back pain laterality  ? [facility] Bryson, Megan P, DO  ? 1 month ago Fever, unspecified fever cause  ? [facility] [place], Megan P, DO  ? 2 months ago Other specified hypothyroidism  ? [facility] Caroleen, Megan P, DO  ? 3 months ago Exposure to the flu  ? [facility] [place], Megan P, DO  ? 9 months ago Cervical radiculopathy  ? [facility] [place], [place] P, DO  ?  ?  ?Future Appointments   ?        ? In 1 week [place], [place], MD [facility]  ? In 2 weeks [facility], Laural Benes, DO [facility], [facility]  ?  ? ?  ?  ?  ? ? ?

## 2022-01-04 ENCOUNTER — Other Ambulatory Visit (INDEPENDENT_AMBULATORY_CARE_PROVIDER_SITE_OTHER): Payer: Self-pay | Admitting: Bariatrics

## 2022-01-04 DIAGNOSIS — F5089 Other specified eating disorder: Secondary | ICD-10-CM

## 2022-01-13 ENCOUNTER — Other Ambulatory Visit: Payer: Self-pay

## 2022-01-13 ENCOUNTER — Encounter (INDEPENDENT_AMBULATORY_CARE_PROVIDER_SITE_OTHER): Payer: Self-pay | Admitting: Bariatrics

## 2022-01-13 ENCOUNTER — Ambulatory Visit (INDEPENDENT_AMBULATORY_CARE_PROVIDER_SITE_OTHER): Payer: Medicare Other | Admitting: Bariatrics

## 2022-01-13 VITALS — BP 132/83 | HR 78 | Temp 98.4°F | Ht 64.0 in | Wt 199.0 lb

## 2022-01-13 DIAGNOSIS — E559 Vitamin D deficiency, unspecified: Secondary | ICD-10-CM | POA: Diagnosis not present

## 2022-01-13 DIAGNOSIS — Z6834 Body mass index (BMI) 34.0-34.9, adult: Secondary | ICD-10-CM

## 2022-01-13 DIAGNOSIS — E8881 Metabolic syndrome: Secondary | ICD-10-CM | POA: Diagnosis not present

## 2022-01-13 DIAGNOSIS — E669 Obesity, unspecified: Secondary | ICD-10-CM | POA: Diagnosis not present

## 2022-01-13 DIAGNOSIS — R632 Polyphagia: Secondary | ICD-10-CM | POA: Diagnosis not present

## 2022-01-13 DIAGNOSIS — F5089 Other specified eating disorder: Secondary | ICD-10-CM | POA: Diagnosis not present

## 2022-01-13 MED ORDER — TRULICITY 0.75 MG/0.5ML ~~LOC~~ SOAJ
0.7500 mg | SUBCUTANEOUS | 0 refills | Status: DC
Start: 2022-01-13 — End: 2022-02-12

## 2022-01-13 MED ORDER — VITAMIN D (ERGOCALCIFEROL) 1.25 MG (50000 UNIT) PO CAPS: 50000.0000 [IU] | ORAL_CAPSULE | ORAL | 0 refills | Status: DC

## 2022-01-13 MED ORDER — BUPROPION HCL ER (SR) 150 MG PO TB12: 150.0000 mg | ORAL_TABLET | Freq: Every day | ORAL | 0 refills | Status: DC

## 2022-01-14 ENCOUNTER — Encounter (INDEPENDENT_AMBULATORY_CARE_PROVIDER_SITE_OTHER): Payer: Self-pay | Admitting: Bariatrics

## 2022-01-14 ENCOUNTER — Ambulatory Visit: Payer: BC Managed Care – PPO | Admitting: Cardiovascular Disease

## 2022-01-14 NOTE — Progress Notes (Signed)
? ? ? ?Chief Complaint:  ? ?OBESITY ?Emily [name] is here to discuss her progress with her obesity treatment plan along with follow-up of her obesity related diagnoses. [name] is on the Category 2 Plan and states she is following her eating plan approximately 70% of the time. [name] states she is doing 0 minutes 0 times per week. ? ?Today's visit was #: 5 ?Starting weight: 218 lbs ?Starting date: 10/24/2021 ?Today's weight: 199 lbs ?Today's date: 01/13/2022 ?Total lbs lost to date: 19 lbs ?Total lbs lost since last in-office visit: 3 lbs ? ?Interim History: [name] is down an additional 3 lbs and doing well overall.  ? ?Subjective:  ? ?1. Vitamin D insufficiency ?[name] is taking Vitamin D as directed.  ? ?2. Polyphagia ?[name] is currently taking Trulicity. ? ?3. Insulin resistance ?[name] is not on medications currently.  ? ?4. Other disorder of eating ?[name] is taking Wellbutrin SR 150 mg currently.  ? ?Assessment/Plan:  ? ?1. Vitamin D insufficiency ?Low Vitamin D level contributes to fatigue and are associated with obesity, breast, and colon cancer. We will refill prescription Vitamin D 50,000 IU every week for 1 month with no refills and [name] will follow-up for routine testing of Vitamin D, at least 2-3 times per year to avoid over-replacement. ? ?- Vitamin D, Ergocalciferol, (DRISDOL) 1.25 MG (50000 UNIT) CAPS capsule; Take 1 capsule (50,000 Units total) by mouth every 7 (seven) days.  Dispense: 4 capsule; Refill: 0 ? ?2. Polyphagia ?Intensive lifestyle modifications are the first line treatment for this issue. We will refill Trulicity 0.75 mg for 1 with no refills. We discussed several lifestyle modifications today and she will continue to work on diet, exercise and weight loss efforts. Orders and follow up as documented in patient record. ? ?Counseling ?Polyphagia is excessive hunger. ?Causes can include: low blood sugars, hypERthyroidism, PMS, lack of sleep, stress, insulin resistance, diabetes, certain medications, and diets  that are deficient in protein and fiber.   ? ?- Dulaglutide (TRULICITY) 0.75 MG/0.5ML SOPN; Inject 0.75 mg into the skin once a week.  Dispense: 2 mL; Refill: 0 ? ?3. Insulin resistance ?[name] agrees to start Trulicity. She will keep her water and protein high. [name] will continue to work on weight loss, exercise, and decreasing simple carbohydrates to help decrease the risk of diabetes. [name] agreed to follow-up with [name] as directed to closely monitor her progress. ? ?4. Other disorder of eating ?We will refill Wellbutrin SR 150 mg for 1 month with no refills. Behavior modification techniques were discussed today to help [name] deal with her emotional/non-hunger eating behaviors.  Orders and follow up as documented in patient record.  ? ?- buPROPion (WELLBUTRIN SR) 150 MG 12 hr tablet; Take 1 tablet (150 mg total) by mouth daily.  Dispense: 30 tablet; Refill: 0 ? ?5. Obesity, current BMI 34.2 ?[name] is currently in the action stage of change. As such, her goal is to continue with weight loss efforts. She has agreed to the Category 2 Plan.  ? ?[name] will continue meal planning. She will adhere to the plan 80-90%.  ? ?Exercise goals: No exercise has been prescribed at this time. ? ?Behavioral modification strategies: increasing lean protein intake, decreasing simple carbohydrates, increasing vegetables, increasing water intake, decreasing eating out, no skipping meals, meal planning and cooking strategies, keeping healthy foods in the home, and planning for success. ? ?[name] has agreed to follow-up with our clinic in 2 weeks with one of the APP's and 4 weeks with myself. She was  informed of the importance of frequent follow-up visits to maximize her success with intensive lifestyle modifications for her multiple health conditions.  ? ?Objective:  ? ?Blood pressure 132/83, pulse 78, temperature 98.4 ?F (36.9 ?C), height 5\' 4"  (1.626 m), weight 199 lb (90.3 kg), SpO2 97 %. ?Body mass index is 34.16 kg/m?. ? ?General:  Cooperative, alert, well developed, in no acute distress. ?HEENT: Conjunctivae and lids unremarkable. ?Cardiovascular: Regular rhythm.  ?Lungs: Normal work of breathing. ?Neurologic: No focal deficits.  ? ?Lab Results  ?Component Value Date  ? CREATININE 0.72 10/15/2021  ? BUN 15 10/15/2021  ? NA 141 10/15/2021  ? K 4.0 10/15/2021  ? CL 100 10/15/2021  ? CO2 27 10/15/2021  ? ?Lab Results  ?Component Value Date  ? ALT 25 10/15/2021  ? AST 22 10/15/2021  ? ALKPHOS 96 10/15/2021  ? BILITOT 0.3 10/15/2021  ? ?Lab Results  ?Component Value Date  ? HGBA1C 5.9 (H) 10/15/2021  ? HGBA1C 5.5 01/21/2021  ? HGBA1C 5.6 02/28/2020  ? HGBA1C 5.6 06/07/2019  ? HGBA1C 5.7 (H) 11/17/2018  ? ?Lab Results  ?Component Value Date  ? INSULIN 21.9 10/24/2021  ? INSULIN 33.8 (H) 11/17/2018  ? ?Lab Results  ?Component Value Date  ? TSH 0.975 11/12/2021  ? ?Lab Results  ?Component Value Date  ? CHOL 265 (H) 10/24/2021  ? HDL 48 10/24/2021  ? LDLCALC 174 (H) 10/24/2021  ? TRIG 227 (H) 10/24/2021  ? ?Lab Results  ?Component Value Date  ? VD25OH 25.7 (L) 10/24/2021  ? VD25OH 30.6 10/15/2021  ? VD25OH 23.1 (L) 02/28/2020  ? ?Lab Results  ?Component Value Date  ? WBC 8.5 10/15/2021  ? HGB 14.0 10/15/2021  ? HCT 40.6 10/15/2021  ? MCV 90 10/15/2021  ? PLT 298 10/15/2021  ? ?No results found for: IRON, TIBC, FERRITIN ? ?Attestation Statements:  ? ?Reviewed by clinician on day of visit: allergies, medications, problem list, medical history, surgical history, family history, social history, and previous encounter notes. ? ?I, 10/17/2021, RMA, am acting as transcriptionist for Jackson Latino, DO. ? ?I have reviewed the above documentation for accuracy and completeness, and I agree with the above. [employer], DO ? ?

## 2022-01-21 ENCOUNTER — Ambulatory Visit: Payer: BC Managed Care – PPO | Admitting: Family Medicine

## 2022-02-05 ENCOUNTER — Other Ambulatory Visit: Payer: Self-pay | Admitting: Family Medicine

## 2022-02-05 NOTE — Telephone Encounter (Signed)
Requested Prescriptions  ?Pending Prescriptions Disp Refills  ?? meloxicam (MOBIC) 7.5 MG tablet [Pharmacy Med Name: MELOXICAM TABS 7.5MG] 90 tablet 3  ?  Sig: TAKE 1 TABLET DAILY  ?  ? Analgesics:  COX2 Inhibitors Failed - 02/05/2022 12:47 AM  ?  ?  Failed - Manual Review: Labs are only required if the patient has taken medication for more than 8 weeks.  ?  ?  Passed - HGB in normal range and within 360 days  ?  Hemoglobin  ?Date Value Ref Range Status  ?10/15/2021 14.0 11.1 - 15.9 g/dL Final  ?   ?  ?  Passed - Cr in normal range and within 360 days  ?  Creatinine  ?Date Value Ref Range Status  ?10/24/2014 0.76 0.60 - 1.30 mg/dL Final  ? ?Creatinine, Ser  ?Date Value Ref Range Status  ?10/15/2021 0.72 0.57 - 1.00 mg/dL Final  ?   ?  ?  Passed - HCT in normal range and within 360 days  ?  Hematocrit  ?Date Value Ref Range Status  ?10/15/2021 40.6 34.0 - 46.6 % Final  ?   ?  ?  Passed - AST in normal range and within 360 days  ?  AST  ?Date Value Ref Range Status  ?10/15/2021 22 0 - 40 IU/L Final  ?   ?  ?  Passed - ALT in normal range and within 360 days  ?  ALT  ?Date Value Ref Range Status  ?10/15/2021 25 0 - 32 IU/L Final  ?   ?  ?  Passed - eGFR is 30 or above and within 360 days  ?  EGFR (African American)  ?Date Value Ref Range Status  ?10/24/2014 >60 >15m/min Final  ? ?GFR calc Af AWyvonnia Lora ?Date Value Ref Range Status  ?02/28/2020 93 >59 mL/min/1.73 Final  ?  Comment:  ?  **[facility] currently reports eGFR in compliance with the current** ?  recommendations of the [employer] [facility] will ?  update reporting as new guidelines are published from the NKF-ASN ?  Task force. ?  ? ?EGFR (Non-African Amer.)  ?Date Value Ref Range Status  ?10/24/2014 >60 >661mmin Final  ?  Comment:  ?  eGFR values <6010min/1.73 m2 may be an indication of chronic ?kidney disease (CKD). ?Calculated eGFR, using the MRDR Study equation, is useful in  ?patients with stable renal function. ?The eGFR calculation will not be  reliable in acutely ill patients ?when serum creatinine is changing rapidly. It is not useful in ?patients on dialysis. The eGFR calculation may not be applicable ?to patients at the low and high extremes of body sizes, pregnant ?women, and vegetarians. ?  ? ?GFR calc non Af Amer  ?Date Value Ref Range Status  ?02/28/2020 80 >59 mL/min/1.73 Final  ? ?eGFR  ?Date Value Ref Range Status  ?10/15/2021 94 >59 mL/min/1.73 Final  ?   ?  ?  Passed - Patient is not pregnant  ?  ?  Passed - Valid encounter within last 12 months  ?  Recent Outpatient Visits   ?      ? 1 month ago Chronic low back pain with sciatica, sciatica laterality unspecified, unspecified back pain laterality  ? [place] [place]  ? 2 months ago Fever, unspecified fever cause  ? [place]  ? 3 months ago Other specified hypothyroidism  ? [place] P, DO  ? 4  months ago Exposure to the flu  ? [place], Megan P, DO  ? 10 months ago Cervical radiculopathy  ? [place], [place] P, DO  ?  ?  ? ?  ?  ?  ? ?

## 2022-02-10 ENCOUNTER — Encounter: Payer: Self-pay | Admitting: Physician Assistant

## 2022-02-10 ENCOUNTER — Ambulatory Visit (INDEPENDENT_AMBULATORY_CARE_PROVIDER_SITE_OTHER): Payer: BC Managed Care – PPO | Admitting: Physician Assistant

## 2022-02-10 ENCOUNTER — Ambulatory Visit: Payer: Self-pay | Admitting: *Deleted

## 2022-02-10 VITALS — BP 122/76 | HR 80 | Temp 98.2°F | Wt 196.0 lb

## 2022-02-10 DIAGNOSIS — R8281 Pyuria: Secondary | ICD-10-CM | POA: Diagnosis not present

## 2022-02-10 DIAGNOSIS — R319 Hematuria, unspecified: Secondary | ICD-10-CM

## 2022-02-10 DIAGNOSIS — N390 Urinary tract infection, site not specified: Secondary | ICD-10-CM

## 2022-02-10 DIAGNOSIS — R309 Painful micturition, unspecified: Secondary | ICD-10-CM

## 2022-02-10 LAB — URINALYSIS, ROUTINE W REFLEX MICROSCOPIC
Bilirubin, UA: NEGATIVE
Glucose, UA: NEGATIVE
Ketones, UA: NEGATIVE
Nitrite, UA: NEGATIVE
Specific Gravity, UA: 1.025 (ref 1.005–1.030)
Urobilinogen, Ur: 0.2 mg/dL (ref 0.2–1.0)
pH, UA: 6.5 (ref 5.0–7.5)

## 2022-02-10 LAB — MICROSCOPIC EXAMINATION
RBC, Urine: >30 /hpf — ABNORMAL HIGH (ref 0–2)
WBC, UA: >30 /hpf — ABNORMAL HIGH (ref 0–5)

## 2022-02-10 MED ORDER — NITROFURANTOIN MONOHYD MACRO 100 MG PO CAPS: 100.0000 mg | ORAL_CAPSULE | Freq: Two times a day (BID) | ORAL | 0 refills | Status: AC

## 2022-02-10 NOTE — Telephone Encounter (Signed)
?  Chief Complaint: Hematuria ?Symptoms: Urine "Pinkish" bladder pressure, lower back pain ?Frequency: 3-4 days ?Pertinent Negatives: Patient denies fever, clots ?Disposition: [] ED /[] Urgent Care (no appt availability in office) / [x] Appointment(In office/virtual)/ []  [facility]/ [] Home Care/ [] Refused Recommended Disposition /[] [facility]/ []  Follow-up with PCP ?Additional Notes: Appt secured for this afternoon. Care advise provided, verbalizes understanding. ?Reason for Disposition ? [1] Pain or burning with passing urine AND [2] side (flank) or back pain present ? ?Answer Assessment - Initial Assessment Questions ?1. COLOR of URINE: "Describe the color of the urine."  (e.g., tea-colored, pink, red, blood clots, bloody) ?    Pinkish urine ?2. ONSET: "When did the bleeding start?"  ?    3-4 days ago ?3. EPISODES: "How many times has there been blood in the urine?" or "How many times today?" ?    Once, each time though ?4. PAIN with URINATION: "Is there any pain with passing your urine?" If Yes, ask: "How bad is the pain?"  (Scale 1-10; or mild, moderate, severe) ?   - MILD - complains slightly about urination hurting ?   - MODERATE - interferes with normal activities   ?   - SEVERE - excruciating, unwilling or unable to urinate because of the pain  ?    Burning ?5. FEVER: "Do you have a fever?" If Yes, ask: "What is your temperature, how was it measured, and when did it start?" ?    No ?6. ASSOCIATED SYMPTOMS: "Are you passing urine more frequently than usual?" ?    Frequency and urgency ?7. OTHER SYMPTOMS: "Do you have any other symptoms?" (e.g., back/flank pain, abdominal pain, vomiting) ?Bladder pressure ? ?Protocols used: Urine - Blood In-A-AH ? ?

## 2022-02-10 NOTE — Patient Instructions (Signed)
Based on your symptoms and results of the urinalysis I believe you have a UTI ?I recommend the following: ?  ?I have sent in a script for Macrobid 100 mg to be taken by mouth twice per day for 5 days  ?Please finish the entire course of the antibiotic even if you are feeling better before it is completed. ?Stay well hydrated (at least 75 oz of water per day) and avoid holding your urine ?If you have any of the following please let us know: persistent symptoms, fever, trouble urinating or inability to urinate, confusion, flank pain.  ? ?It was nice to meet you and I appreciate the opportunity to be involved in your care ? ?

## 2022-02-10 NOTE — Progress Notes (Signed)
? ? ?  ?    Acute Office Visit ? ? ?Patient: Emily [name]   DOB: [birth year]   61 y.o. Female  MRN: [id number] ?Visit Date: 02/10/2022 ? 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  ? ? ?CC: concern for blood in urine, bladder pressure, lower back pain for the past 3-4 days  ? ?Subjective  ?  ?HPI  ? ? ?Started about 3 days ago ?States she noticed blood when wiping after urinating ?Reports pressure with urination ?States she has sensation of incomplete voiding  ?Reports she is having to get up several times per night to urinate ?Reports she has baseline, chronic lower back pain and is unsure if she is developing flank pain  ? ?Medications: ?Outpatient Medications Prior to Visit  ?Medication Sig  ? buPROPion (WELLBUTRIN SR) 150 MG 12 hr tablet Take 1 tablet (150 mg total) by mouth daily.  ? cyclobenzaprine (FLEXERIL) 10 MG tablet Take 1 tablet (10 mg total) by mouth 3 (three) times daily as needed.  ? Dulaglutide (TRULICITY) 0.75 MG/0.5ML SOPN Inject 0.75 mg into the skin once a week.  ? hydrochlorothiazide (HYDRODIURIL) 25 MG tablet Take 1 tablet (25 mg total) by mouth daily.  ? levothyroxine (SYNTHROID) 137 MCG tablet Take 1 tablet (137 mcg total) by mouth daily before breakfast.  ? losartan (COZAAR) 100 MG tablet Take 1 tablet (100 mg total) by mouth daily.  ? meloxicam (MOBIC) 7.5 MG tablet TAKE 1 TABLET DAILY  ? nortriptyline (PAMELOR) 25 MG capsule Take 1 capsule (25 mg total) by mouth at bedtime.  ? triamcinolone ointment (KENALOG) 0.5 % Apply 1 application topically 2 (two) times daily.  ? venlafaxine XR (EFFEXOR-XR) 150 MG 24 hr capsule TAKE 1 CAPSULE DAILY WITH BREAKFAST (COURTESY REFILL, NEED OFFICE VISIT)  ? Vitamin D, Ergocalciferol, (DRISDOL) 1.25 MG (50000 UNIT) CAPS capsule Take 1 capsule (50,000 Units total) by mouth every 7 (seven) days.  ? ?No facility-administered medications prior to visit.   ? ? ?Review of Systems  ?Constitutional:  Positive for fatigue. Negative for chills, diaphoresis and fever.  ?Gastrointestinal:  Negative for nausea and vomiting.  ?Genitourinary:  Positive for dysuria, flank pain, frequency, hematuria and urgency. Negative for decreased urine volume, difficulty urinating and enuresis.  ?Neurological:  Negative for dizziness, light-headedness and headaches.  ? ? ?  Objective  ?  ?BP 122/76   Pulse 80   Temp 98.2 ?F (36.8 ?C) (Oral)   Wt 196 lb (88.9 kg)   SpO2 97%   BMI 33.64 kg/m?  ? ? ?Physical Exam ?Constitutional:   ?   General: She is awake.  ?   Appearance: Normal appearance. She is well-developed, well-groomed and overweight.  ?HENT:  ?   Head: Normocephalic and atraumatic.  ?Cardiovascular:  ?   Rate and Rhythm: Normal rate and regular rhythm.  ?   Heart sounds: Normal heart sounds.  ?Pulmonary:  ?   Effort: Pulmonary effort is normal.  ?   Breath sounds: Normal breath sounds and air entry. No decreased air movement.  ?Abdominal:  ?   General: Abdomen is flat. Bowel sounds are normal.  ?   Palpations: Abdomen is soft.  ?   Tenderness: There is no abdominal tenderness.  ?Musculoskeletal:  ?   Right lower leg: No edema.  ?   Left lower leg: No edema.  ?Neurological:  ?   General: No focal deficit  present.  ?   Mental Status: She is alert and oriented to person, place, and time.  ?   GCS: GCS eye subscore is 4. GCS verbal subscore is 5. GCS motor subscore is 6.  ?Psychiatric:     ?   Attention and Perception: Attention normal.     ?   Mood and Affect: Mood and affect normal.     ?   Speech: Speech normal.     ?   Behavior: Behavior normal. Behavior is cooperative.  ? ? ? ? ?No results found for any visits on 02/10/22. ? Assessment & Plan  ?  ? ?Problem List Items Addressed This Visit   ?None ?Visit Diagnoses   ? ? Pain with urination    -  Primary ?Acute, new problem with associated hematuria ?UA performed which was positive for blood, protein, leukocytes ?UA micro was  positive for WBC, RBC, mucus, bacteria  ?Urine sample sent for urine culture to further guide management ?  ? Relevant Orders  ? Urinalysis, Routine w reflex microscopic  ? Urinary tract infection with hematuria, site unspecified     ?Acute, new problem ?Patient reports symptoms comprised of the following: pelvic pressure, incomplete voiding, increased urinary frequency, urinary urgency for approx 3 days  ?Results of UA are consistent with UTI - urine sample sent for culture to determine causative organism and susceptibility ?Recommend starting Macrobid 100 mg PO BID x 5 days pending results of culture ?Will provide script - discussed importance of finishing entire course of abx and staying well hydrated while recovering from UTI ?Reviewed ED precautions with patient ?Follow up as needed for persistent or worsening symptoms ? ?  ? Relevant Medications  ? nitrofurantoin, macrocrystal-monohydrate, (MACROBID) 100 MG capsule  ? ?  ? ? ? ?No follow-ups on file. ? ? ?I, Luvenia Cranford E Jru Pense, PA-C, have reviewed all documentation for this visit. The documentation on 02/10/22 for the exam, diagnosis, procedures, and orders are all accurate and complete. ? ? ?Emily [name], MHS, PA-C ?[facility] ?[facility]  ? ? ?No follow-ups on file.  ?   ? ? ? ? ?

## 2022-02-12 ENCOUNTER — Ambulatory Visit (INDEPENDENT_AMBULATORY_CARE_PROVIDER_SITE_OTHER): Payer: Medicare Other | Admitting: Bariatrics

## 2022-02-12 ENCOUNTER — Encounter (INDEPENDENT_AMBULATORY_CARE_PROVIDER_SITE_OTHER): Payer: Self-pay | Admitting: Bariatrics

## 2022-02-12 VITALS — BP 121/82 | HR 69 | Temp 97.5°F | Ht 64.0 in | Wt 193.0 lb

## 2022-02-12 DIAGNOSIS — Z6833 Body mass index (BMI) 33.0-33.9, adult: Secondary | ICD-10-CM

## 2022-02-12 DIAGNOSIS — R632 Polyphagia: Secondary | ICD-10-CM | POA: Diagnosis not present

## 2022-02-12 DIAGNOSIS — E559 Vitamin D deficiency, unspecified: Secondary | ICD-10-CM

## 2022-02-12 DIAGNOSIS — E669 Obesity, unspecified: Secondary | ICD-10-CM

## 2022-02-12 DIAGNOSIS — E66812 Obesity, class 2: Secondary | ICD-10-CM

## 2022-02-12 DIAGNOSIS — F5089 Other specified eating disorder: Secondary | ICD-10-CM | POA: Diagnosis not present

## 2022-02-12 DIAGNOSIS — Z6837 Body mass index (BMI) 37.0-37.9, adult: Secondary | ICD-10-CM

## 2022-02-12 MED ORDER — BUPROPION HCL ER (SR) 150 MG PO TB12: 150.0000 mg | ORAL_TABLET | Freq: Every day | ORAL | 0 refills | Status: DC

## 2022-02-12 MED ORDER — VITAMIN D (ERGOCALCIFEROL) 1.25 MG (50000 UNIT) PO CAPS: 50000.0000 [IU] | ORAL_CAPSULE | ORAL | 0 refills | Status: DC

## 2022-02-12 MED ORDER — TRULICITY 1.5 MG/0.5ML ~~LOC~~ SOAJ: 1.5000 mg | SUBCUTANEOUS | 0 refills | Status: DC

## 2022-02-14 LAB — URINE CULTURE

## 2022-02-14 MED ORDER — SULFAMETHOXAZOLE-TRIMETHOPRIM 800-160 MG PO TABS: 1.0000 | ORAL_TABLET | Freq: Two times a day (BID) | ORAL | 0 refills | Status: AC

## 2022-02-14 NOTE — Addendum Note (Signed)
Addended by: Jacquelin Hawking on: 02/14/2022 08:54 AM ? ? Modules accepted: Orders ? ?

## 2022-02-17 ENCOUNTER — Ambulatory Visit: Payer: Self-pay | Admitting: *Deleted

## 2022-02-17 NOTE — Telephone Encounter (Signed)
Pt given lab results per notes of E. Mecum, PA from 02/14/22 on 02/17/22. Pt verbalized understanding and has picked up bactrim from pharmacy. Reviewed message via My Chart on 02/14/22 at 6:14 pm.  ?

## 2022-02-19 ENCOUNTER — Other Ambulatory Visit (INDEPENDENT_AMBULATORY_CARE_PROVIDER_SITE_OTHER): Payer: Self-pay | Admitting: Bariatrics

## 2022-02-19 DIAGNOSIS — E559 Vitamin D deficiency, unspecified: Secondary | ICD-10-CM

## 2022-02-20 NOTE — Progress Notes (Signed)
? ? ? ?Chief Complaint:  ? ?OBESITY ?Emily [name] is here to discuss her progress with her obesity treatment plan along with follow-up of her obesity related diagnoses. Emily [name] is on the Category 2 Plan and states she is following her eating plan approximately 80% of the time. Emily [name] states she is currently not exercising.   ? ?Today's visit was #: 6 ?Starting weight: 218 lbs ?Starting date: 10/24/2021 ?Today's weight: 193 lbs ?Today's date: 02/12/2022 ?Total lbs lost to date: 25 lbs ?Total lbs lost since last in-office visit: 6 lbs ? ?Interim History: Emily [name] is down another 6 lbs since her last visit and doing well overall. ? ?Subjective:  ? ?1. Polyphagia ?Emily [name] reports Trulicity helps with appetite and she ha no side effects. ? ?2. Vitamin D insufficiency ?She is currently taking prescription vitamin D 50,000 IU each week.  ? ?3. Other disorder of eating ?Emily [name] is taking Wellbutrin SR 150 mg daily. ? ?Assessment/Plan:  ? ?1. Polyphagia ?Will refill Trulicity. ?- Dulaglutide (TRULICITY) 1.5 MG/0.5ML SOPN; Inject 1.5 mg into the skin once a week.  Dispense: 2 mL; Refill: 0 ? ?2. Vitamin D insufficiency ?Low Vitamin D level contributes to fatigue and are associated with obesity, breast, and colon cancer. She agrees to continue to take prescription Vitamin D @50 ,000 IU every week and will follow-up for routine testing of Vitamin D, at least 2-3 times per year to avoid over-replacement. ?- Vitamin D, Ergocalciferol, (DRISDOL) 1.25 MG (50000 UNIT) CAPS capsule; Take 1 capsule (50,000 Units total) by mouth every 7 (seven) days.  Dispense: 4 capsule; Refill: 0 ? ?3. Other disorder of eating ?Emily [name] is taking Wellbutrin as directed, it helps with emotional eating. ?- buPROPion (WELLBUTRIN SR) 150 MG 12 hr tablet; Take 1 tablet (150 mg total) by mouth daily.  Dispense: 30 tablet; Refill: 0 ? ?4. Obesity, Current BMI 33. ?Emily [name] is currently in the action stage of change. As such, her goal is to continue with weight loss efforts. She has agreed  to the Category 2 Plan.  ? ?Emily [name] will work on meal planning and intentional eating. ? ?Exercise goals: As is. ? ?Behavioral modification strategies: increasing lean protein intake, decreasing simple carbohydrates, increasing vegetables, increasing water intake, decreasing eating out, no skipping meals, meal planning and cooking strategies, keeping healthy foods in the home, and planning for success. ? ?Emily [name] has agreed to follow-up with our clinic in 2-3 weeks. She was informed of the importance of frequent follow-up visits to maximize her success with intensive lifestyle modifications for her multiple health conditions.  ? ? ?Objective:  ? ?Blood pressure 121/82, pulse 69, temperature (!) 97.5 ?F (36.4 ?C), height 5\' 4"  (1.626 m), weight 193 lb (87.5 kg), SpO2 94 %. ?Body mass index is 33.13 kg/m?. ? ?General: Cooperative, alert, well developed, in no acute distress. ?HEENT: Conjunctivae and lids unremarkable. ?Cardiovascular: Regular rhythm.  ?Lungs: Normal work of breathing. ?Neurologic: No focal deficits.  ? ?Lab Results  ?Component Value Date  ? CREATININE 0.72 10/15/2021  ? BUN 15 10/15/2021  ? NA 141 10/15/2021  ? K 4.0 10/15/2021  ? CL 100 10/15/2021  ? CO2 27 10/15/2021  ? ?Lab Results  ?Component Value Date  ? ALT 25 10/15/2021  ? AST 22 10/15/2021  ? ALKPHOS 96 10/15/2021  ? BILITOT 0.3 10/15/2021  ? ?Lab Results  ?Component Value Date  ? HGBA1C 5.9 (H) 10/15/2021  ? HGBA1C 5.5 01/21/2021  ? HGBA1C 5.6 02/28/2020  ? HGBA1C 5.6 06/07/2019  ? HGBA1C 5.7 (H) 11/17/2018  ? ?  Lab Results  ?Component Value Date  ? INSULIN 21.9 10/24/2021  ? INSULIN 33.8 (H) 11/17/2018  ? ?Lab Results  ?Component Value Date  ? TSH 0.975 11/12/2021  ? ?Lab Results  ?Component Value Date  ? CHOL 265 (H) 10/24/2021  ? HDL 48 10/24/2021  ? LDLCALC 174 (H) 10/24/2021  ? TRIG 227 (H) 10/24/2021  ? ?Lab Results  ?Component Value Date  ? VD25OH 25.7 (L) 10/24/2021  ? VD25OH 30.6 10/15/2021  ? VD25OH 23.1 (L) 02/28/2020  ? ?Lab Results   ?Component Value Date  ? WBC 8.5 10/15/2021  ? HGB 14.0 10/15/2021  ? HCT 40.6 10/15/2021  ? MCV 90 10/15/2021  ? PLT 298 10/15/2021  ? ?No results found for: IRON, TIBC, FERRITIN ? ?Obesity Behavioral Intervention:  ? ?Approximately 15 minutes were spent on the discussion below. ? ?ASK: ?We discussed the diagnosis of obesity with Emily [name] today and Emily [name] agreed to give [name] permission to discuss obesity behavioral modification therapy today. ? ?ASSESS: ?Emily [name] has the diagnosis of obesity and her BMI today is 33.1. Emily [name] is in the action stage of change.  ? ?ADVISE: ?Emily [name] was educated on the multiple health risks of obesity as well as the benefit of weight loss to improve her health. She was advised of the need for long term treatment and the importance of lifestyle modifications to improve her current health and to decrease her risk of future health problems. ? ?AGREE: ?Multiple dietary modification options and treatment options were discussed and Emily [name] agreed to follow the recommendations documented in the above note. ? ?ARRANGE: ?Emily [name] was educated on the importance of frequent visits to treat obesity as outlined per CMS and USPSTF guidelines and agreed to schedule her next follow up appointment today. ? ?Attestation Statements:  ? ?Reviewed by clinician on day of visit: allergies, medications, problem list, medical history, surgical history, family history, social history, and previous encounter notes. ? ?I, Paulla Fore, am acting as Energy manager for [employer], DO. ? ?I have reviewed the above documentation for accuracy and completeness, and I agree with the above. Corinna Capra, DO ? ?

## 2022-02-24 ENCOUNTER — Encounter (INDEPENDENT_AMBULATORY_CARE_PROVIDER_SITE_OTHER): Payer: Self-pay | Admitting: Bariatrics

## 2022-03-10 ENCOUNTER — Encounter (INDEPENDENT_AMBULATORY_CARE_PROVIDER_SITE_OTHER): Payer: Self-pay | Admitting: Nurse Practitioner

## 2022-03-10 ENCOUNTER — Ambulatory Visit (INDEPENDENT_AMBULATORY_CARE_PROVIDER_SITE_OTHER): Payer: Medicare Other | Admitting: Nurse Practitioner

## 2022-03-10 VITALS — BP 117/80 | HR 70 | Temp 98.4°F | Ht 64.0 in | Wt 190.0 lb

## 2022-03-10 DIAGNOSIS — E559 Vitamin D deficiency, unspecified: Secondary | ICD-10-CM

## 2022-03-10 DIAGNOSIS — N959 Unspecified menopausal and perimenopausal disorder: Secondary | ICD-10-CM | POA: Diagnosis not present

## 2022-03-10 DIAGNOSIS — E669 Obesity, unspecified: Secondary | ICD-10-CM | POA: Diagnosis not present

## 2022-03-10 DIAGNOSIS — Z6831 Body mass index (BMI) 31.0-31.9, adult: Secondary | ICD-10-CM

## 2022-03-10 DIAGNOSIS — R632 Polyphagia: Secondary | ICD-10-CM | POA: Diagnosis not present

## 2022-03-10 DIAGNOSIS — F5089 Other specified eating disorder: Secondary | ICD-10-CM | POA: Diagnosis not present

## 2022-03-10 MED ORDER — TRULICITY 1.5 MG/0.5ML ~~LOC~~ SOAJ: 1.5000 mg | SUBCUTANEOUS | 0 refills | Status: DC

## 2022-03-10 MED ORDER — VITAMIN D (ERGOCALCIFEROL) 1.25 MG (50000 UNIT) PO CAPS: 50000.0000 [IU] | ORAL_CAPSULE | ORAL | 0 refills | Status: DC

## 2022-03-10 NOTE — Patient Instructions (Signed)
The 10-year ASCVD risk score (Arnett DK, et al., 2019) is: 6.4%   Values used to calculate the score:     Age: 61 years     Sex: Female     Is Non-Hispanic African American: No     Diabetic: No     Tobacco smoker: No     Systolic Blood Pressure: 121 mmHg     Is BP treated: Yes     HDL Cholesterol: 48 mg/dL     Total Cholesterol: 265 mg/dL

## 2022-03-13 ENCOUNTER — Encounter: Payer: Self-pay | Admitting: Family Medicine

## 2022-03-13 ENCOUNTER — Ambulatory Visit (INDEPENDENT_AMBULATORY_CARE_PROVIDER_SITE_OTHER): Payer: Medicare Other | Admitting: Family Medicine

## 2022-03-13 VITALS — BP 117/71 | HR 82 | Temp 98.0°F | Ht 65.0 in | Wt 190.6 lb

## 2022-03-13 DIAGNOSIS — Z0000 Encounter for general adult medical examination without abnormal findings: Secondary | ICD-10-CM | POA: Diagnosis not present

## 2022-03-13 DIAGNOSIS — B372 Candidiasis of skin and nail: Secondary | ICD-10-CM

## 2022-03-13 DIAGNOSIS — I10 Essential (primary) hypertension: Secondary | ICD-10-CM | POA: Diagnosis not present

## 2022-03-13 DIAGNOSIS — E559 Vitamin D deficiency, unspecified: Secondary | ICD-10-CM | POA: Diagnosis not present

## 2022-03-13 DIAGNOSIS — E038 Other specified hypothyroidism: Secondary | ICD-10-CM

## 2022-03-13 DIAGNOSIS — R8281 Pyuria: Secondary | ICD-10-CM | POA: Diagnosis not present

## 2022-03-13 DIAGNOSIS — R7303 Prediabetes: Secondary | ICD-10-CM | POA: Diagnosis not present

## 2022-03-13 DIAGNOSIS — F339 Major depressive disorder, recurrent, unspecified: Secondary | ICD-10-CM

## 2022-03-13 DIAGNOSIS — M4726 Other spondylosis with radiculopathy, lumbar region: Secondary | ICD-10-CM

## 2022-03-13 DIAGNOSIS — E782 Mixed hyperlipidemia: Secondary | ICD-10-CM | POA: Diagnosis not present

## 2022-03-13 DIAGNOSIS — Z23 Encounter for immunization: Secondary | ICD-10-CM

## 2022-03-13 LAB — URINALYSIS, ROUTINE W REFLEX MICROSCOPIC
Bilirubin, UA: NEGATIVE
Glucose, UA: NEGATIVE
Ketones, UA: NEGATIVE
Nitrite, UA: NEGATIVE
RBC, UA: NEGATIVE
Specific Gravity, UA: 1.025 (ref 1.005–1.030)
Urobilinogen, Ur: 0.2 mg/dL (ref 0.2–1.0)
pH, UA: 6 (ref 5.0–7.5)

## 2022-03-13 LAB — MICROALBUMIN, URINE WAIVED
Creatinine, Urine Waived: 200 mg/dL (ref 10–300)
Microalb, Ur Waived: 80 mg/L — ABNORMAL HIGH (ref 0–19)

## 2022-03-13 LAB — MICROSCOPIC EXAMINATION
Bacteria, UA: NONE SEEN
RBC, Urine: NONE SEEN /hpf (ref 0–2)

## 2022-03-13 LAB — BAYER DCA HB A1C WAIVED: HB A1C (BAYER DCA - WAIVED): 5.2 % (ref 4.8–5.6)

## 2022-03-13 MED ORDER — LOSARTAN POTASSIUM 100 MG PO TABS: 100.0000 mg | ORAL_TABLET | Freq: Every day | ORAL | 1 refills | Status: DC

## 2022-03-13 MED ORDER — VENLAFAXINE HCL ER 150 MG PO CP24: ORAL_CAPSULE | ORAL | 1 refills | Status: DC

## 2022-03-13 MED ORDER — CLOTRIMAZOLE-BETAMETHASONE 1-0.05 % EX CREA: 1.0000 "application " | TOPICAL_CREAM | Freq: Every day | CUTANEOUS | 0 refills | Status: DC

## 2022-03-13 MED ORDER — HYDROCHLOROTHIAZIDE 25 MG PO TABS: 25.0000 mg | ORAL_TABLET | Freq: Every day | ORAL | 1 refills | Status: DC

## 2022-03-13 NOTE — Assessment & Plan Note (Signed)
Under good control on current regimen. Continue current regimen. Continue to monitor. Call with any concerns. Refills given. Labs drawn today.   

## 2022-03-13 NOTE — Assessment & Plan Note (Signed)
Rechecking labs today. Await results. Treat as needed.  °

## 2022-03-13 NOTE — Assessment & Plan Note (Signed)
Rechecking labs today. Await results. Treat as needed. Continue to monitor.  °

## 2022-03-13 NOTE — Progress Notes (Signed)
BP 117/71   Pulse 82   Temp 98 F (36.7 C)   Ht 5\' 5"  (1.651 m)   Wt 190 lb 9.6 oz (86.5 kg)   SpO2 98%   BMI 31.72 kg/m    Subjective:    Patient ID: Emily [name], female    DOB: [birth year], 61 y.o.   MRN: [id number]  HPI: Emily [name] is a 61 y.o. female presenting on 03/13/2022 for comprehensive medical examination. Current medical complaints include:  BACK PAIN Duration: chronic Mechanism of injury: unknown Location: Left and low back Onset: sudden Severity: severe Quality: aching, shooting, numb and tingling Frequency: constant Radiation: L leg below the knee Aggravating factors: lifting, movement, walking, laying, bending, and prolonged sitting Alleviating factors: nothing Status: worse Treatments attempted: rest, ice, heat, APAP, ibuprofen, aleve, physical therapy, and HEP  Relief with NSAIDs?: mild Nighttime pain:  yes Paresthesias / decreased sensation:  yes Bowel / bladder incontinence:  no Fevers:  no Dysuria / urinary frequency:  no  HYPERTENSION / HYPERLIPIDEMIA Satisfied with current treatment? yes Duration of hypertension: chronic BP monitoring frequency: not checking BP medication side effects: no Past BP meds: losartan Duration of hyperlipidemia: chronic Cholesterol medication side effects: no Cholesterol supplements: none Past cholesterol medications: none Medication compliance: excellent compliance Aspirin: no Recent stressors: no Recurrent headaches: no Visual changes: no Palpitations: no Dyspnea: no Chest pain: no Lower extremity edema: no Dizzy/lightheaded: no  HYPOTHYROIDISM Thyroid control status:controlled Satisfied with current treatment? yes Medication side effects: no Medication compliance: excellent compliance Recent dose adjustment:no Fatigue: yes Cold intolerance: no Heat intolerance: no Weight gain: no Weight loss: yes Constipation: no Diarrhea/loose stools: no Palpitations: no Lower extremity edema:  no Anxiety/depressed mood: no  Impaired Fasting Glucose HbA1C:  Lab Results  Component Value Date   HGBA1C 5.9 (H) 10/15/2021   Duration of elevated blood sugar: chronic Polydipsia: no Polyuria: no Weight change: yes Visual disturbance: no Glucose Monitoring: no Diabetic Education: Not Completed Family history of diabetes: yes  DEPRESSION Mood status: stable Satisfied with current treatment?: yes Symptom severity: mild  Duration of current treatment : chronic Side effects: no Medication compliance: excellent compliance Psychotherapy/counseling: no  Previous psychiatric medications: effexor Depressed mood: yes Anxious mood: yes Anhedonia: no Significant weight loss or gain: no Insomnia: no  Fatigue: yes Feelings of worthlessness or guilt: no Impaired concentration/indecisiveness: no Suicidal ideations: no Hopelessness: no Crying spells: no    03/13/2022   11:15 AM 02/10/2022    2:40 PM 12/10/2021    9:16 AM 11/12/2021    2:37 PM 10/24/2021   10:12 AM  Depression screen PHQ 2/9  Decreased Interest 1 0 0 1 3  Down, Depressed, Hopeless 1 0 0 1 2  PHQ - 2 Score 2 0 0 2 5  Altered sleeping 1 1 2 2 2   Tired, decreased energy 2 1 2 2 2   Change in appetite 0 0 0 0 1  Feeling bad or failure about yourself  0 0 0 0 1  Trouble concentrating 0 0 1 1 1   Moving slowly or fidgety/restless 0 0 0 0 0  Suicidal thoughts 0 0 0 0 0  PHQ-9 Score 5 2 5 7 12   Difficult doing work/chores  Not difficult at all   Somewhat difficult   She currently lives with: husband Menopausal Symptoms: no  Depression Screen done today and results listed below:     03/13/2022   11:15 AM 02/10/2022    2:40 PM 12/10/2021    9:16  AM 11/12/2021    2:37 PM 10/24/2021   10:12 AM  Depression screen PHQ 2/9  Decreased Interest 1 0 0 1 3  Down, Depressed, Hopeless 1 0 0 1 2  PHQ - 2 Score 2 0 0 2 5  Altered sleeping 1 1 2 2 2   Tired, decreased energy 2 1 2 2 2   Change in appetite 0 0 0 0 1  Feeling bad  or failure about yourself  0 0 0 0 1  Trouble concentrating 0 0 1 1 1   Moving slowly or fidgety/restless 0 0 0 0 0  Suicidal thoughts 0 0 0 0 0  PHQ-9 Score 5 2 5 7 12   Difficult doing work/chores  Not difficult at all   Somewhat difficult   Past Medical History:  Past Medical History:  Diagnosis Date   Anxiety    Back pain    Chest pain    a. 10/2013 Neg ETT; b. 10/2015 Refused MV->Cath: nl cors, nl EF.   Constipation    Degenerative disc disease, lumbar    Depression    Essential hypertension    Fatigue    GERD (gastroesophageal reflux disease)    Hypothyroidism    Joint pain in fingers of both hands    Morbid obesity (HCC)    Myalgia    Neck pain    SOB (shortness of breath)     Surgical History:  Past Surgical History:  Procedure Laterality Date   CARDIAC CATHETERIZATION N/A 11/14/2015   Procedure: Left Heart Cath and Coronary Angiography;  Surgeon: Wellington Hampshire, MD;  Location: [facility];  Service: Cardiovascular;  Laterality: N/A;   CARPAL TUNNEL RELEASE Left 07/03/2015   Procedure: CARPAL TUNNEL RELEASE;  Surgeon: Christophe Louis, MD;  Location: [facility];  Service: Orthopedics;  Laterality: Left;   CARPAL TUNNEL RELEASE Right 09/07/2015   Procedure: CARPAL TUNNEL RELEASE;  Surgeon: Christophe Louis, MD;  Location: [facility];  Service: Orthopedics;  Laterality: Right;   CHOLECYSTECTOMY     COLONOSCOPY WITH PROPOFOL N/A 05/30/2021   Procedure: COLONOSCOPY WITH PROPOFOL;  Surgeon: Virgel Manifold, MD;  Location: [facility];  Service: Endoscopy;  Laterality: N/A;   KNEE SURGERY Right    TUBAL LIGATION      Medications:  Current Outpatient Medications on File Prior to Visit  Medication Sig   cyclobenzaprine (FLEXERIL) 10 MG tablet Take 1 tablet (10 mg total) by mouth 3 (three) times daily as needed.   Dulaglutide (TRULICITY) 1.5 0000000 SOPN Inject 1.5 mg into the skin once a week.   levothyroxine (SYNTHROID) 137 MCG tablet Take 1 tablet (137 mcg  total) by mouth daily before breakfast.   meloxicam (MOBIC) 7.5 MG tablet TAKE 1 TABLET DAILY   triamcinolone ointment (KENALOG) 0.5 % Apply 1 application topically 2 (two) times daily.   Vitamin D, Ergocalciferol, (DRISDOL) 1.25 MG (50000 UNIT) CAPS capsule Take 1 capsule (50,000 Units total) by mouth every 7 (seven) days.   No current facility-administered medications on file prior to visit.    Allergies:  No Known Allergies  Social History:  Social History   Socioeconomic History   Marital status: Married    Spouse name: Dellis Filbert   Number of children: Not on file   Years of education: Not on file   Highest education level: Not on file  Occupational History   Occupation: retired from [employer] 30 yrs of service  Tobacco Use   Smoking status: Former    Packs/day: 0.25  Years: 10.00    Pack years: 2.50    Types: Cigarettes    Quit date: 07/02/1995    Years since quitting: 26.7   Smokeless tobacco: Never  Vaping Use   Vaping Use: Never used  Substance and Sexual Activity   Alcohol use: No    Alcohol/week: 0.0 standard drinks   Drug use: No   Sexual activity: Yes    Partners: Male  Other Topics Concern   Not on file  Social History Narrative   Not on file   Social Determinants of Health   Financial Resource Strain: Not on file  Food Insecurity: Not on file  Transportation Needs: Not on file  Physical Activity: Not on file  Stress: Not on file  Social Connections: Not on file  Intimate Partner Violence: Not on file   Social History   Tobacco Use  Smoking Status Former   Packs/day: 0.25   Years: 10.00   Pack years: 2.50   Types: Cigarettes   Quit date: 07/02/1995   Years since quitting: 26.7  Smokeless Tobacco Never   Social History   Substance and Sexual Activity  Alcohol Use No   Alcohol/week: 0.0 standard drinks    Family History:  Family History  Problem Relation Age of Onset   Stroke Mother    Heart failure Mother    Heart disease  Mother    Hypertension Mother    Thyroid disease Mother    Depression Mother    Anxiety disorder Mother    Obesity Mother    Heart attack Father 56   Hypertension Father    Hyperlipidemia Father    Alzheimer's disease Father    Heart disease Father    Obesity Father    Cancer Paternal Uncle        prostate   Diabetes Maternal Grandmother    Diabetes Paternal Grandmother    CAD Cousin 5       passed   Breast cancer Neg Hx     Past medical history, surgical history, medications, allergies, family history and social history reviewed with patient today and changes made to appropriate areas of the chart.   Review of Systems  Constitutional: Negative.   HENT: Negative.    Eyes: Negative.   Respiratory: Negative.    Cardiovascular: Negative.   Gastrointestinal: Negative.   Genitourinary: Negative.   Musculoskeletal:  Positive for back pain and myalgias. Negative for falls, joint pain and neck pain.  Skin: Negative.   Neurological:  Positive for tingling and focal weakness. Negative for dizziness, tremors, sensory change, speech change, seizures, loss of consciousness, weakness and headaches.  Endo/Heme/Allergies: Negative.   Psychiatric/Behavioral: Negative.    All other ROS negative except what is listed above and in the HPI.      Objective:    BP 117/71   Pulse 82   Temp 98 F (36.7 C)   Ht 5\' 5"  (1.651 m)   Wt 190 lb 9.6 oz (86.5 kg)   SpO2 98%   BMI 31.72 kg/m   Wt Readings from Last 3 Encounters:  03/13/22 190 lb 9.6 oz (86.5 kg)  03/10/22 190 lb (86.2 kg)  02/12/22 193 lb (87.5 kg)    Physical Exam Vitals and nursing note reviewed.  Constitutional:      General: She is not in acute distress.    Appearance: Normal appearance. She is obese. She is not ill-appearing, toxic-appearing or diaphoretic.  HENT:     Head: Normocephalic and atraumatic.  Right Ear: Tympanic membrane, ear canal and external ear normal. There is no impacted cerumen.     Left Ear:  Tympanic membrane, ear canal and external ear normal. There is no impacted cerumen.     Nose: Nose normal. No congestion or rhinorrhea.     Mouth/Throat:     Mouth: Mucous membranes are moist.     Pharynx: Oropharynx is clear. No oropharyngeal exudate or posterior oropharyngeal erythema.  Eyes:     General: No scleral icterus.       Right eye: No discharge.        Left eye: No discharge.     Extraocular Movements: Extraocular movements intact.     Conjunctiva/sclera: Conjunctivae normal.     Pupils: Pupils are equal, round, and reactive to light.  Neck:     Vascular: No carotid bruit.  Cardiovascular:     Rate and Rhythm: Normal rate and regular rhythm.     Pulses: Normal pulses.     Heart sounds: No murmur heard.   No friction rub. No gallop.  Pulmonary:     Effort: Pulmonary effort is normal. No respiratory distress.     Breath sounds: Normal breath sounds. No stridor. No wheezing, rhonchi or rales.  Chest:     Chest wall: No tenderness.  Abdominal:     General: Abdomen is flat. Bowel sounds are normal. There is no distension.     Palpations: Abdomen is soft. There is no mass.     Tenderness: There is no abdominal tenderness. There is no right CVA tenderness, left CVA tenderness, guarding or rebound.     Hernia: No hernia is present.  Genitourinary:    Comments: Breast and pelvic exams deferred with shared decision making Musculoskeletal:        General: No swelling, tenderness, deformity or signs of injury.     Cervical back: Normal range of motion and neck supple. No rigidity. No muscular tenderness.     Right lower leg: No edema.     Left lower leg: No edema.  Lymphadenopathy:     Cervical: No cervical adenopathy.  Skin:    General: Skin is warm and dry.     Capillary Refill: Capillary refill takes less than 2 seconds.     Coloration: Skin is not jaundiced or pale.     Findings: No bruising, erythema, lesion or rash.  Neurological:     General: No focal deficit  present.     Mental Status: She is alert and oriented to person, place, and time. Mental status is at baseline.     Cranial Nerves: No cranial nerve deficit.     Sensory: No sensory deficit.     Motor: No weakness.     Coordination: Coordination normal.     Gait: Gait normal.     Deep Tendon Reflexes: Reflexes normal.  Psychiatric:        Mood and Affect: Mood normal.        Behavior: Behavior normal.        Thought Content: Thought content normal.        Judgment: Judgment normal.    Results for orders placed or performed in visit on 02/10/22  Urine Culture   Specimen: Urine   UR  Result Value Ref Range   Urine Culture, Routine Final report (A)    Organism ID, Bacteria Proteus mirabilis (A)    Antimicrobial Susceptibility Comment   Microscopic Examination   Urine  Result Value Ref Range   WBC,  UA >30 (H) 0 - 5 /hpf   RBC >30 (H) 0 - 2 /hpf   Epithelial Cells (non renal) 0-10 0 - 10 /hpf   Renal Epithel, UA 0-10 (A) None seen /hpf   Mucus, UA Present (A) Not Estab.   Bacteria, UA Many (A) None seen/Few  Urinalysis, Routine w reflex microscopic  Result Value Ref Range   Specific Gravity, UA 1.025 1.005 - 1.030   pH, UA 6.5 5.0 - 7.5   Color, UA Yellow Yellow   Appearance Ur Clear Clear   Leukocytes,UA 3+ (A) Negative   Protein,UA 2+ (A) Negative/Trace   Glucose, UA Negative Negative   Ketones, UA Negative Negative   RBC, UA 3+ (A) Negative   Bilirubin, UA Negative Negative   Urobilinogen, Ur 0.2 0.2 - 1.0 mg/dL   Nitrite, UA Negative Negative   Microscopic Examination See below:       Assessment & Plan:   Problem List Items Addressed This Visit       Cardiovascular and Mediastinum   Essential hypertension    Under good control on current regimen. Continue current regimen. Continue to monitor. Call with any concerns. Refills given. Labs drawn today.        Relevant Medications   hydrochlorothiazide (HYDRODIURIL) 25 MG tablet   losartan (COZAAR) 100 MG  tablet   Other Relevant Orders   CBC with Differential/Platelet   Comprehensive metabolic panel   Microalbumin, Urine Waived     Endocrine   Hypothyroidism    Rechecking labs today. Await results. Treat as needed. Continue to monitor.        Relevant Orders   CBC with Differential/Platelet   Comprehensive metabolic panel   TSH     Nervous and Auditory   Osteoarthritis of spine with radiculopathy, lumbar region    Not getting better. Failed PT. Will obtain MRI and get her into physiatry for evaluation for injections. Continue to monitor. Call with any concerns.        Relevant Medications   venlafaxine XR (EFFEXOR-XR) 150 MG 24 hr capsule   Other Relevant Orders   MR Lumbar Spine Wo Contrast   Ambulatory referral to Physical Medicine Rehab     Other   Depression, recurrent ([place])    Under good control on current regimen. Continue current regimen. Continue to monitor. Call with any concerns. Refills given. Labs drawn today.        Relevant Medications   venlafaxine XR (EFFEXOR-XR) 150 MG 24 hr capsule   Other Relevant Orders   CBC with Differential/Platelet   Comprehensive metabolic panel   Vitamin D deficiency    Rechecking labs today. Await results. Treat as needed.        Relevant Orders   CBC with Differential/Platelet   Comprehensive metabolic panel   VITAMIN D 25 Hydroxy (Vit-D Deficiency, Fractures)   Prediabetes    Rechecking labs today. Await results. Treat as needed.        Relevant Orders   CBC with Differential/Platelet   Comprehensive metabolic panel   Bayer DCA Hb A1c Waived   Hyperlipidemia    Under good control on current regimen. Continue current regimen. Continue to monitor. Call with any concerns. Refills given. Labs drawn today.        Relevant Medications   hydrochlorothiazide (HYDRODIURIL) 25 MG tablet   losartan (COZAAR) 100 MG tablet   Other Relevant Orders   CBC with Differential/Platelet   Comprehensive metabolic panel    Lipid Panel w/o Chol/HDL Ratio  Other Visit Diagnoses     Routine general medical examination at a health care facility    -  Primary   Vaccines updated today. Screening labs checked today. Mammo, pap and colonoscopy up to date. Continue diet and exercise. Call with any concerns.    Relevant Orders   CBC with Differential/Platelet   Comprehensive metabolic panel   Urinalysis, Routine w reflex microscopic   Candidal intertrigo       Will treat with lotrisone. Call if not getting better or getting worse.    Relevant Medications   clotrimazole-betamethasone (LOTRISONE) cream   Pyuria       Will check Urine culture. Await results.    Relevant Orders   Urine Culture        Follow up plan: Return in about 6 months (around 09/13/2022) for with me, 3 months nurse only for shingrix #2.   LABORATORY TESTING:  - Pap smear: up to date  IMMUNIZATIONS:   - Tdap: Tetanus vaccination status reviewed: last tetanus booster within 10 years. - Influenza: Up to date - Pneumovax: Up to date - Prevnar: Not applicable - COVID: Refused - HPV: Not applicable - Shingrix vaccine: Administered today  SCREENING: -Mammogram: Up to date  - Colonoscopy: Up to date   PATIENT COUNSELING:   Advised to take 1 mg of folate supplement per day if capable of pregnancy.   Sexuality: Discussed sexually transmitted diseases, partner selection, use of condoms, avoidance of unintended pregnancy  and contraceptive alternatives.   Advised to avoid cigarette smoking.  I discussed with the patient that most people either abstain from alcohol or drink within safe limits (<=14/week and <=4 drinks/occasion for males, <=7/weeks and <= 3 drinks/occasion for females) and that the risk for alcohol disorders and other health effects rises proportionally with the number of drinks per week and how often a drinker exceeds daily limits.  Discussed cessation/primary prevention of drug use and availability of treatment for  abuse.   Diet: Encouraged to adjust caloric intake to maintain  or achieve ideal body weight, to reduce intake of dietary saturated fat and total fat, to limit sodium intake by avoiding high sodium foods and not adding table salt, and to maintain adequate dietary potassium and calcium preferably from fresh fruits, vegetables, and low-fat dairy products.    stressed the importance of regular exercise  Injury prevention: Discussed safety belts, safety helmets, smoke detector, smoking near bedding or upholstery.   Dental health: Discussed importance of regular tooth brushing, flossing, and dental visits.    NEXT PREVENTATIVE PHYSICAL DUE IN 1 YEAR. Return in about 6 months (around 09/13/2022) for with me, 3 months nurse only for shingrix #2.

## 2022-03-13 NOTE — Assessment & Plan Note (Signed)
Not getting better. Failed PT. Will obtain MRI and get her into physiatry for evaluation for injections. Continue to monitor. Call with any concerns.

## 2022-03-14 DIAGNOSIS — R8281 Pyuria: Secondary | ICD-10-CM | POA: Diagnosis not present

## 2022-03-14 LAB — CBC WITH DIFFERENTIAL/PLATELET
Basophils Absolute: 0.1 10*3/uL (ref 0.0–0.2)
Basos: 1 %
EOS (ABSOLUTE): 0.2 10*3/uL (ref 0.0–0.4)
Eos: 3 %
Hematocrit: 44 % (ref 34.0–46.6)
Hemoglobin: 15.2 g/dL (ref 11.1–15.9)
Immature Grans (Abs): 0 10*3/uL (ref 0.0–0.1)
Immature Granulocytes: 0 %
Lymphocytes Absolute: 2.5 10*3/uL (ref 0.7–3.1)
Lymphs: 38 %
MCH: 31.7 pg (ref 26.6–33.0)
MCHC: 34.5 g/dL (ref 31.5–35.7)
MCV: 92 fL (ref 79–97)
Monocytes Absolute: 0.5 10*3/uL (ref 0.1–0.9)
Monocytes: 7 %
Neutrophils Absolute: 3.4 10*3/uL (ref 1.4–7.0)
Neutrophils: 51 %
Platelets: 262 10*3/uL (ref 150–450)
RBC: 4.8 x10E6/uL (ref 3.77–5.28)
RDW: 12.8 % (ref 11.7–15.4)
WBC: 6.6 10*3/uL (ref 3.4–10.8)

## 2022-03-14 LAB — COMPREHENSIVE METABOLIC PANEL
ALT: 23 IU/L (ref 0–32)
AST: 18 IU/L (ref 0–40)
Albumin/Globulin Ratio: 1.6 (ref 1.2–2.2)
Albumin: 4.8 g/dL (ref 3.8–4.8)
Alkaline Phosphatase: 72 IU/L (ref 44–121)
BUN/Creatinine Ratio: 22 (ref 12–28)
BUN: 21 mg/dL (ref 8–27)
Bilirubin Total: 0.4 mg/dL (ref 0.0–1.2)
CO2: 23 mmol/L (ref 20–29)
Calcium: 10.4 mg/dL — ABNORMAL HIGH (ref 8.7–10.3)
Chloride: 100 mmol/L (ref 96–106)
Creatinine, Ser: 0.95 mg/dL (ref 0.57–1.00)
Globulin, Total: 3 g/dL (ref 1.5–4.5)
Glucose: 96 mg/dL (ref 70–99)
Potassium: 4.1 mmol/L (ref 3.5–5.2)
Sodium: 140 mmol/L (ref 134–144)
Total Protein: 7.8 g/dL (ref 6.0–8.5)
eGFR: 68 mL/min/{1.73_m2} (ref >59)

## 2022-03-14 LAB — TSH: TSH: 1.83 u[IU]/mL (ref 0.450–4.500)

## 2022-03-14 LAB — LIPID PANEL W/O CHOL/HDL RATIO
Cholesterol, Total: 200 mg/dL — ABNORMAL HIGH (ref 100–199)
HDL: 44 mg/dL (ref >39)
LDL Chol Calc (NIH): 129 mg/dL — ABNORMAL HIGH (ref 0–99)
Triglycerides: 149 mg/dL (ref 0–149)
VLDL Cholesterol Cal: 27 mg/dL (ref 5–40)

## 2022-03-14 LAB — VITAMIN D 25 HYDROXY (VIT D DEFICIENCY, FRACTURES): Vit D, 25-Hydroxy: 30.1 ng/mL (ref 30.0–100.0)

## 2022-03-17 ENCOUNTER — Encounter: Payer: Self-pay | Admitting: Family Medicine

## 2022-03-17 LAB — URINE CULTURE

## 2022-03-18 NOTE — Progress Notes (Signed)
Chief Complaint:   OBESITY Emily [name] is here to discuss her progress with her obesity treatment plan along with follow-up of her obesity related diagnoses. Emily [name] is on the Category 2 Plan and states she is following her eating plan approximately 70-80% of the time. Emily [name] states she is doing yardwork 2-3  times per week.  Today's visit was #: 7 Starting weight: 218 lbs Starting date: 10/24/2021 Today's weight: 190 lbs Today's date: 03/10/2022 Total lbs lost to date: 28 lbs Total lbs lost since last in-office visit: 3 lbs  Interim History: Emily [name] overall done well with weight loss. She is doing well with Category 2. She denies hunger and cravings. She is drinking water and [employer] milk. She denies sugary drinks.   Subjective:   1. Polyphagia Emily [name] is currently taking Trulicity 1.5 mg. She denies side effects. She denies hunger or cravings while taking.   2. Vitamin D insufficiency Emily [name] is taking Vitamin D 50,000 IU weekly. We discussed side effects. Dexa was ordered today.   3. Other disorder of eating Emily [name] never started Wellbutrin and doesn't want to start at this time.   4. Menopausal disorder Emily [name] has never had a Dexa. She has Vitamin D deficiency. She is taking Vitamin D 50,000 IU weekly.   Assessment/Plan:   1. Polyphagia Intensive lifestyle modifications are the first line treatment for this issue. We will refill Trulicity 1.5 mg for 1 month with no refills. We discussed several lifestyle modifications today and she will continue to work on diet, exercise and weight loss efforts. Orders and follow up as documented in patient record.  Counseling Polyphagia is excessive hunger. Causes can include: low blood sugars, hypERthyroidism, PMS, lack of sleep, stress, insulin resistance, diabetes, certain medications, and diets that are deficient in protein and fiber.    - Dulaglutide (TRULICITY) 1.5 MG/0.5ML SOPN; Inject 1.5 mg into the skin once a week.  Dispense: 2 mL; Refill: 0  2.  Vitamin D insufficiency Low Vitamin D level contributes to fatigue and are associated with obesity, breast, and colon cancer. We will refill prescription Vitamin D 50,000 IU every week for 1 month with no refills and Emily [name] will follow-up for routine testing of Vitamin D, at least 2-3 times per year to avoid over-replacement. We discussed side effects. Dexa was ordered today.   - Vitamin D, Ergocalciferol, (DRISDOL) 1.25 MG (50000 UNIT) CAPS capsule; Take 1 capsule (50,000 Units total) by mouth every 7 (seven) days.  Dispense: 4 capsule; Refill: 0 - DG Bone Density; Future  3. Other disorder of eating Behavior modification techniques were discussed today to help Emily [name] deal with her emotional/non-hunger eating behaviors.  Orders and follow up as documented in patient record.   4. Menopausal disorder Dexa ordered today.   - DG Bone Density; Future  5. Obesity, Current BMI 33.1 Emily [name] is currently in the action stage of change. As such, her goal is to continue with weight loss efforts. She has agreed to the Category 2 Plan.   Exercise goals:  As is.   Behavioral modification strategies: increasing lean protein intake, increasing water intake, no skipping meals, meal planning and cooking strategies, and planning for success.  Emily [name] has agreed to follow-up with our clinic in 4 weeks. She was informed of the importance of frequent follow-up visits to maximize her success with intensive lifestyle modifications for her multiple health conditions.   Objective:   Blood pressure 117/80, pulse 70, temperature 98.4 F (36.9 C), height 5\' 4"  (1.626 m), weight  190 lb (86.2 kg), SpO2 97 %. Body mass index is 32.61 kg/m.  General: Cooperative, alert, well developed, in no acute distress. HEENT: Conjunctivae and lids unremarkable. Cardiovascular: Regular rhythm.  Lungs: Normal work of breathing. Neurologic: No focal deficits.   Lab Results  Component Value Date   CREATININE 0.95 03/13/2022   BUN 21  03/13/2022   NA 140 03/13/2022   K 4.1 03/13/2022   CL 100 03/13/2022   CO2 23 03/13/2022   Lab Results  Component Value Date   ALT 23 03/13/2022   AST 18 03/13/2022   ALKPHOS 72 03/13/2022   BILITOT 0.4 03/13/2022   Lab Results  Component Value Date   HGBA1C 5.2 03/13/2022   HGBA1C 5.9 (H) 10/15/2021   HGBA1C 5.5 01/21/2021   HGBA1C 5.6 02/28/2020   HGBA1C 5.6 06/07/2019   Lab Results  Component Value Date   INSULIN 21.9 10/24/2021   INSULIN 33.8 (H) 11/17/2018   Lab Results  Component Value Date   TSH 1.830 03/13/2022   Lab Results  Component Value Date   CHOL 200 (H) 03/13/2022   HDL 44 03/13/2022   LDLCALC 129 (H) 03/13/2022   TRIG 149 03/13/2022   Lab Results  Component Value Date   VD25OH 30.1 03/13/2022   VD25OH 25.7 (L) 10/24/2021   VD25OH 30.6 10/15/2021   Lab Results  Component Value Date   WBC 6.6 03/13/2022   HGB 15.2 03/13/2022   HCT 44.0 03/13/2022   MCV 92 03/13/2022   PLT 262 03/13/2022   No results found for: IRON, TIBC, FERRITIN  Obesity Behavioral Intervention:   Approximately 15 minutes were spent on the discussion below.  ASK: We discussed the diagnosis of obesity with Emily [name] today and Emily [name] agreed to give [name] permission to discuss obesity behavioral modification therapy today.  ASSESS: Emily [name] has the diagnosis of obesity and her BMI today is 32.7. Emily [name] is in the action stage of change.   ADVISE: Emily [name] was educated on the multiple health risks of obesity as well as the benefit of weight loss to improve her health. She was advised of the need for long term treatment and the importance of lifestyle modifications to improve her current health and to decrease her risk of future health problems.  AGREE: Multiple dietary modification options and treatment options were discussed and Emily [name] agreed to follow the recommendations documented in the above note.  ARRANGE: Emily [name] was educated on the importance of frequent visits to treat obesity as  outlined per CMS and USPSTF guidelines and agreed to schedule her next follow up appointment today.  Attestation Statements:   Reviewed by clinician on day of visit: allergies, medications, problem list, medical history, surgical history, family history, social history, and previous encounter notes.  I, Jackson Latino, RMA, am acting as Energy manager for Irene Limbo, FNP.   I have reviewed the above documentation for accuracy and completeness, and I agree with the above. Irene Limbo, FNP

## 2022-03-20 ENCOUNTER — Other Ambulatory Visit: Payer: Self-pay | Admitting: Family Medicine

## 2022-03-20 MED ORDER — AMOXICILLIN 500 MG PO CAPS: 500.0000 mg | ORAL_CAPSULE | Freq: Two times a day (BID) | ORAL | 0 refills | Status: AC

## 2022-03-25 ENCOUNTER — Other Ambulatory Visit (INDEPENDENT_AMBULATORY_CARE_PROVIDER_SITE_OTHER): Payer: Self-pay | Admitting: Bariatrics

## 2022-03-25 DIAGNOSIS — F5089 Other specified eating disorder: Secondary | ICD-10-CM

## 2022-03-27 ENCOUNTER — Ambulatory Visit
Admission: RE | Admit: 2022-03-27 | Discharge: 2022-03-27 | Disposition: A | Discharge status: Home / Self Care | Payer: Medicare Other | Source: Ambulatory Visit | Attending: Family Medicine | Admitting: Family Medicine

## 2022-03-27 DIAGNOSIS — M5116 Intervertebral disc disorders with radiculopathy, lumbar region: Secondary | ICD-10-CM | POA: Diagnosis not present

## 2022-03-27 DIAGNOSIS — M4726 Other spondylosis with radiculopathy, lumbar region: Secondary | ICD-10-CM | POA: Insufficient documentation

## 2022-03-27 DIAGNOSIS — M5117 Intervertebral disc disorders with radiculopathy, lumbosacral region: Secondary | ICD-10-CM | POA: Diagnosis not present

## 2022-03-31 ENCOUNTER — Ambulatory Visit (INDEPENDENT_AMBULATORY_CARE_PROVIDER_SITE_OTHER): Payer: Medicare Other | Admitting: Bariatrics

## 2022-03-31 ENCOUNTER — Encounter (INDEPENDENT_AMBULATORY_CARE_PROVIDER_SITE_OTHER): Payer: Self-pay | Admitting: Bariatrics

## 2022-03-31 VITALS — BP 130/69 | HR 67 | Temp 97.9°F | Ht 65.0 in | Wt 189.0 lb

## 2022-03-31 DIAGNOSIS — I10 Essential (primary) hypertension: Secondary | ICD-10-CM | POA: Diagnosis not present

## 2022-03-31 DIAGNOSIS — E669 Obesity, unspecified: Secondary | ICD-10-CM

## 2022-03-31 DIAGNOSIS — E559 Vitamin D deficiency, unspecified: Secondary | ICD-10-CM | POA: Diagnosis not present

## 2022-03-31 DIAGNOSIS — Z6832 Body mass index (BMI) 32.0-32.9, adult: Secondary | ICD-10-CM | POA: Diagnosis not present

## 2022-03-31 MED ORDER — VITAMIN D (ERGOCALCIFEROL) 1.25 MG (50000 UNIT) PO CAPS: 50000.0000 [IU] | ORAL_CAPSULE | ORAL | 0 refills | Status: DC

## 2022-03-31 NOTE — Progress Notes (Signed)
Chief Complaint:   OBESITY Emily [name] is here to discuss her progress with her obesity treatment plan along with follow-up of her obesity related diagnoses. Emily [name] is on the Category 2 Plan and states she is following her eating plan approximately 75% of the time. Emily [name] states she is doing 0 minutes 0 times per week.  Today's visit was #: 8 Starting weight: 218 lbs Starting date: 10/24/2021 Today's weight: 189 lbs Today's date: 03/31/2022 Total lbs lost to date: 29 lbs Total lbs lost since last in-office visit: 1 lb  Interim History: Emily [name] is down another pound. She has been doing well. She has had a lot of celebrations. She is doing well with water and protein.   Subjective:   1. Vitamin D insufficiency Emily [name] is taking Vitamin D as directed.   2. Essential hypertension Emily [name]'s blood pressure is controlled. Her blood pressure today is 130/69.  Assessment/Plan:   1. Vitamin D insufficiency Low Vitamin D level contributes to fatigue and are associated with obesity, breast, and colon cancer. We will refill prescription Vitamin D 50,000 IU every week for 1 month with no refills and Genavie will follow-up for routine testing of Vitamin D, at least 2-3 times per year to avoid over-replacement.  - Vitamin D, Ergocalciferol, (DRISDOL) 1.25 MG (50000 UNIT) CAPS capsule; Take 1 capsule (50,000 Units total) by mouth every 7 (seven) days.  Dispense: 4 capsule; Refill: 0  2. Essential hypertension Emily [name] will continue her medications. She is working on healthy weight loss and exercise to improve blood pressure control. We will watch for signs of hypotension as she continues her lifestyle modifications.  3. Obesity, Current BMI 32.6 Emily [name] is currently in the action stage of change. As such, her goal is to continue with weight loss efforts. She has agreed to the Category 2 Plan.   Emily [name] will adhere closely to the plan 80-90%. She will continue meal planning.   Exercise goals: No exercise has been prescribed  at this time.  Behavioral modification strategies: increasing lean protein intake, decreasing simple carbohydrates, increasing vegetables, increasing water intake, decreasing eating out, no skipping meals, meal planning and cooking strategies, keeping healthy foods in the home, and planning for success.  Emily [name] has agreed to follow-up with our clinic in 3-4 weeks. She was informed of the importance of frequent follow-up visits to maximize her success with intensive lifestyle modifications for her multiple health conditions.   Objective:   Blood pressure 130/69, pulse 67, temperature 97.9 F (36.6 C), height 5\' 5"  (1.651 m), weight 189 lb (85.7 kg), SpO2 94 %. Body mass index is 31.45 kg/m.  General: Cooperative, alert, well developed, in no acute distress. HEENT: Conjunctivae and lids unremarkable. Cardiovascular: Regular rhythm.  Lungs: Normal work of breathing. Neurologic: No focal deficits.   Lab Results  Component Value Date   CREATININE 0.95 03/13/2022   BUN 21 03/13/2022   NA 140 03/13/2022   K 4.1 03/13/2022   CL 100 03/13/2022   CO2 23 03/13/2022   Lab Results  Component Value Date   ALT 23 03/13/2022   AST 18 03/13/2022   ALKPHOS 72 03/13/2022   BILITOT 0.4 03/13/2022   Lab Results  Component Value Date   HGBA1C 5.2 03/13/2022   HGBA1C 5.9 (H) 10/15/2021   HGBA1C 5.5 01/21/2021   HGBA1C 5.6 02/28/2020   HGBA1C 5.6 06/07/2019   Lab Results  Component Value Date   INSULIN 21.9 10/24/2021   INSULIN 33.8 (H) 11/17/2018   Lab Results  Component Value Date   TSH 1.830 03/13/2022   Lab Results  Component Value Date   CHOL 200 (H) 03/13/2022   HDL 44 03/13/2022   LDLCALC 129 (H) 03/13/2022   TRIG 149 03/13/2022   Lab Results  Component Value Date   VD25OH 30.1 03/13/2022   VD25OH 25.7 (L) 10/24/2021   VD25OH 30.6 10/15/2021   Lab Results  Component Value Date   WBC 6.6 03/13/2022   HGB 15.2 03/13/2022   HCT 44.0 03/13/2022   MCV 92 03/13/2022    PLT 262 03/13/2022   No results found for: "IRON", "TIBC", "FERRITIN"  Attestation Statements:   Reviewed by clinician on day of visit: allergies, medications, problem list, medical history, surgical history, family history, social history, and previous encounter notes.  I, Lizbeth Bark, RMA, am acting as Location manager for [employer], DO.  I have reviewed the above documentation for accuracy and completeness, and I agree with the above. Jearld Lesch, DO

## 2022-04-01 ENCOUNTER — Encounter (INDEPENDENT_AMBULATORY_CARE_PROVIDER_SITE_OTHER): Payer: Self-pay | Admitting: Bariatrics

## 2022-04-02 ENCOUNTER — Other Ambulatory Visit: Payer: Self-pay | Admitting: Family Medicine

## 2022-04-02 DIAGNOSIS — M4726 Other spondylosis with radiculopathy, lumbar region: Secondary | ICD-10-CM

## 2022-04-04 ENCOUNTER — Telehealth: Payer: Self-pay | Admitting: Family Medicine

## 2022-04-04 NOTE — Telephone Encounter (Signed)
Spoke with patient in regards to hip injection, patient states she has not heard from ortho. Advised patient referral is placed and it can take a few days to hear from them. Patient verbally understood.

## 2022-04-04 NOTE — Telephone Encounter (Signed)
Copied from CRM [id number]. Topic: General - Other >> Apr 04, 2022 12:21 PM Emily [name] wrote: Reason for CRM: Pt is requesting a call back from the clinic regarding the next steps for her hip injection, discussed during last OV with PCP   [phone]

## 2022-04-08 ENCOUNTER — Ambulatory Visit: Payer: Medicare Other

## 2022-04-10 ENCOUNTER — Ambulatory Visit: Payer: Medicare Other

## 2022-04-15 ENCOUNTER — Telehealth: Payer: Self-pay

## 2022-04-15 NOTE — Telephone Encounter (Signed)
Copied from CRM [id number]. Topic: General - Other >> Apr 15, 2022  2:01 PM [name] wrote: Patient would like PCP to excuse her for 04/21/2022 Emily [name] duty due to back pain. Patient would like a follow up call   Routing to provider.

## 2022-04-17 ENCOUNTER — Encounter: Payer: Self-pay | Admitting: Family Medicine

## 2022-04-17 NOTE — Telephone Encounter (Signed)
Note on her mychart. OK to print and I'll sign if she needs a paper copy

## 2022-04-18 NOTE — Telephone Encounter (Signed)
Called patient in regards to scheduling AWV. While on the pone she asked about her Emily [name] duty note, I informed her it was in her MyChart. Patient stated that she would like to pick up a copy, she will come by the office today to pick it up.

## 2022-04-18 NOTE — Telephone Encounter (Signed)
Letter printed and picked up by the patient's husband.

## 2022-04-18 NOTE — Telephone Encounter (Signed)
Called patient to inform her that her note is in Mychart. If patient calls back and she needs a paper copy please let us know and we will take care of this for her.

## 2022-04-21 ENCOUNTER — Ambulatory Visit (INDEPENDENT_AMBULATORY_CARE_PROVIDER_SITE_OTHER): Payer: Medicare Other | Admitting: Bariatrics

## 2022-04-21 ENCOUNTER — Ambulatory Visit (INDEPENDENT_AMBULATORY_CARE_PROVIDER_SITE_OTHER): Payer: Medicare Other | Admitting: *Deleted

## 2022-04-21 DIAGNOSIS — Z0000 Encounter for general adult medical examination without abnormal findings: Secondary | ICD-10-CM | POA: Diagnosis not present

## 2022-04-21 NOTE — Patient Instructions (Addendum)
[name] , Thank you for taking time to come for your Medicare Wellness Visit. I appreciate your ongoing commitment to your health goals. Please review the following plan we discussed and let me know if I can assist you in the future.   Screening recommendations/referrals: Colonoscopy: up to date Mammogram: up to date Bone Density: was ordered number given Recommended yearly ophthalmology/optometry visit for glaucoma screening and checkup Recommended yearly dental visit for hygiene and checkup  Vaccinations: Influenza vaccine: up to date Pneumococcal vaccine: up to date Tdap vaccine: up tod date  Shingles vaccine: 1 of 2    Advanced directives: Education provided  Conditions/risks identified:   Next appointment: 06-13-2022 @ 2:00  Nurse visit   09-15-2022 @ 1:40  [facility] 40-64 Years and Older, Female Preventive care refers to lifestyle choices and visits with your health care provider that can promote health and wellness. What does preventive care include? A yearly physical exam. This is also called an annual well check. Dental exams once or twice a year. Routine eye exams. Ask your health care provider how often you should have your eyes checked. Personal lifestyle choices, including: Daily care of your teeth and gums. Regular physical activity. Eating a healthy diet. Avoiding tobacco and drug use. Limiting alcohol use. Practicing safe sex. Taking low-dose aspirin every day. Taking vitamin and mineral supplements as recommended by your health care provider. What happens during an annual well check? The services and screenings done by your health care provider during your annual well check will depend on your age, overall health, lifestyle risk factors, and family history of disease. Counseling  Your health care provider may ask you questions about your: Alcohol use. Tobacco use. Drug use. Emotional well-being. Home and relationship well-being. Sexual  activity. Eating habits. History of falls. Memory and ability to understand (cognition). Work and work Astronomer. Reproductive health. Screening  You may have the following tests or measurements: Height, weight, and BMI. Blood pressure. Lipid and cholesterol levels. These may be checked every 5 years, or more frequently if you are over 93 years old. Skin check. Lung cancer screening. You may have this screening every year starting at age 64 if you have a 30-pack-year history of smoking and currently smoke or have quit within the past 15 years. Fecal occult blood test (FOBT) of the stool. You may have this test every year starting at age 59. Flexible sigmoidoscopy or colonoscopy. You may have a sigmoidoscopy every 5 years or a colonoscopy every 10 years starting at age 60. Hepatitis C blood test. Hepatitis B blood test. Sexually transmitted disease (STD) testing. Diabetes screening. This is done by checking your blood sugar (glucose) after you have not eaten for a while (fasting). You may have this done every 1-3 years. Bone density scan. This is done to screen for osteoporosis. You may have this done starting at age 34. Mammogram. This may be done every 1-2 years. Talk to your health care provider about how often you should have regular mammograms. Talk with your health care provider about your test results, treatment options, and if necessary, the need for more tests. Vaccines  Your health care provider may recommend certain vaccines, such as: Influenza vaccine. This is recommended every year. Tetanus, diphtheria, and acellular pertussis (Tdap, Td) vaccine. You may need a Td booster every 10 years. Zoster vaccine. You may need this after age 93. Pneumococcal 13-valent conjugate (PCV13) vaccine. One dose is recommended after age 60. Pneumococcal polysaccharide (PPSV23) vaccine. One  dose is recommended after age 34. Talk to your health care provider about which screenings and vaccines  you need and how often you need them. This information is not intended to replace advice given to you by your health care provider. Make sure you discuss any questions you have with your health care provider. Document Released: 11/02/2015 Document Revised: 06/25/2016 Document Reviewed: 08/07/2015 Elsevier Interactive Patient Education  2017 [place] Prevention in the Home Falls can cause injuries. They can happen to people of all ages. There are many things you can do to make your home safe and to help prevent falls. What can I do on the outside of my home? Regularly fix the edges of walkways and driveways and fix any cracks. Remove anything that might make you trip as you walk through a door, such as a raised step or threshold. Trim any bushes or trees on the path to your home. Use bright outdoor lighting. Clear any walking paths of anything that might make someone trip, such as rocks or tools. Regularly check to see if handrails are loose or broken. Make sure that both sides of any steps have handrails. Any raised decks and porches should have guardrails on the edges. Have any leaves, snow, or ice cleared regularly. Use sand or salt on walking paths during winter. Clean up any spills in your garage right away. This includes oil or grease spills. What can I do in the bathroom? Use night lights. Install grab bars by the toilet and in the tub and shower. Do not use towel bars as grab bars. Use non-skid mats or decals in the tub or shower. If you need to sit down in the shower, use a plastic, non-slip stool. Keep the floor dry. Clean up any water that spills on the floor as soon as it happens. Remove soap buildup in the tub or shower regularly. Attach bath mats securely with double-sided non-slip rug tape. Do not have throw rugs and other things on the floor that can make you trip. What can I do in the bedroom? Use night lights. Make sure that you have a light by your bed that  is easy to reach. Do not use any sheets or blankets that are too big for your bed. They should not hang down onto the floor. Have a firm chair that has side arms. You can use this for support while you get dressed. Do not have throw rugs and other things on the floor that can make you trip. What can I do in the kitchen? Clean up any spills right away. Avoid walking on wet floors. Keep items that you use a lot in easy-to-reach places. If you need to reach something above you, use a strong step stool that has a grab bar. Keep electrical cords out of the way. Do not use floor polish or wax that makes floors slippery. If you must use wax, use non-skid floor wax. Do not have throw rugs and other things on the floor that can make you trip. What can I do with my stairs? Do not leave any items on the stairs. Make sure that there are handrails on both sides of the stairs and use them. Fix handrails that are broken or loose. Make sure that handrails are as long as the stairways. Check any carpeting to make sure that it is firmly attached to the stairs. Fix any carpet that is loose or worn. Avoid having throw rugs at the top or bottom of the  stairs. If you do have throw rugs, attach them to the floor with carpet tape. Make sure that you have a light switch at the top of the stairs and the bottom of the stairs. If you do not have them, ask someone to add them for you. What else can I do to help prevent falls? Wear shoes that: Do not have high heels. Have rubber bottoms. Are comfortable and fit you well. Are closed at the toe. Do not wear sandals. If you use a stepladder: Make sure that it is fully opened. Do not climb a closed stepladder. Make sure that both sides of the stepladder are locked into place. Ask someone to hold it for you, if possible. Clearly mark and make sure that you can see: Any grab bars or handrails. First and last steps. Where the edge of each step is. Use tools that help you  move around (mobility aids) if they are needed. These include: Canes. Walkers. Scooters. Crutches. Turn on the lights when you go into a dark area. Replace any light bulbs as soon as they burn out. Set up your furniture so you have a clear path. Avoid moving your furniture around. If any of your floors are uneven, fix them. If there are any pets around you, be aware of where they are. Review your medicines with your doctor. Some medicines can make you feel dizzy. This can increase your chance of falling. Ask your doctor what other things that you can do to help prevent falls. This information is not intended to replace advice given to you by your health care provider. Make sure you discuss any questions you have with your health care provider. Document Released: 08/02/2009 Document Revised: 03/13/2016 Document Reviewed: 11/10/2014 Elsevier Interactive Patient Education  2017 [employer].

## 2022-04-21 NOTE — Progress Notes (Signed)
Subjective:   Emily [name] is a 61 y.o. female who presents for an Initial Medicare Annual Wellness Visit.  I connected with  Emily [name] on 04/21/22 by a telephone enabled telemedicine application and verified that I am speaking with the correct person using two identifiers.   I discussed the limitations of evaluation and management by telemedicine. The patient expressed understanding and agreed to proceed.  Patient location: home  Provider location: Tele-health-home    Review of Systems           Objective:    There were no vitals filed for this visit. There is no height or weight on file to calculate BMI.     05/30/2021    7:02 AM 11/14/2015    8:58 AM 09/07/2015    8:35 AM 03/03/2014    8:34 AM  Advanced Directives  Does Patient Have a Medical Advance Directive? No No No Patient would not like information  Would patient like information on creating a medical advance directive?  Yes - Educational materials given No - patient declined information     Current Medications (verified) Outpatient Encounter Medications as of 04/21/2022  Medication Sig   clotrimazole-betamethasone (LOTRISONE) cream Apply 1 application. topically daily.   cyclobenzaprine (FLEXERIL) 10 MG tablet Take 1 tablet (10 mg total) by mouth 3 (three) times daily as needed.   Dulaglutide (TRULICITY) 1.5 MG/0.5ML SOPN Inject 1.5 mg into the skin once a week.   hydrochlorothiazide (HYDRODIURIL) 25 MG tablet Take 1 tablet (25 mg total) by mouth daily.   levothyroxine (SYNTHROID) 137 MCG tablet Take 1 tablet (137 mcg total) by mouth daily before breakfast.   losartan (COZAAR) 100 MG tablet Take 1 tablet (100 mg total) by mouth daily.   meloxicam (MOBIC) 7.5 MG tablet TAKE 1 TABLET DAILY   triamcinolone ointment (KENALOG) 0.5 % Apply 1 application topically 2 (two) times daily.   venlafaxine XR (EFFEXOR-XR) 150 MG 24 hr capsule TAKE 1 CAPSULE DAILY WITH BREAKFAST (COURTESY REFILL, NEED OFFICE VISIT)    Vitamin D, Ergocalciferol, (DRISDOL) 1.25 MG (50000 UNIT) CAPS capsule Take 1 capsule (50,000 Units total) by mouth every 7 (seven) days.   No facility-administered encounter medications on file as of 04/21/2022.    Allergies (verified) Patient has no known allergies.   History: Past Medical History:  Diagnosis Date   Anxiety    Back pain    Chest pain    a. 10/2013 Neg ETT; b. 10/2015 Refused MV->Cath: nl cors, nl EF.   Constipation    Degenerative disc disease, lumbar    Depression    Essential hypertension    Fatigue    GERD (gastroesophageal reflux disease)    Hypothyroidism    Joint pain in fingers of both hands    Morbid obesity (HCC)    Myalgia    Neck pain    SOB (shortness of breath)    Past Surgical History:  Procedure Laterality Date   CARDIAC CATHETERIZATION N/A 11/14/2015   Procedure: Left Heart Cath and Coronary Angiography;  Surgeon: Iran Ouch, MD;  Location: [facility];  Service: Cardiovascular;  Laterality: N/A;   CARPAL TUNNEL RELEASE Left 07/03/2015   Procedure: CARPAL TUNNEL RELEASE;  Surgeon: Myra Rude, MD;  Location: [facility];  Service: Orthopedics;  Laterality: Left;   CARPAL TUNNEL RELEASE Right 09/07/2015   Procedure: CARPAL TUNNEL RELEASE;  Surgeon: Myra Rude, MD;  Location: [facility];  Service: Orthopedics;  Laterality: Right;   CHOLECYSTECTOMY  COLONOSCOPY WITH PROPOFOL N/A 05/30/2021   Procedure: COLONOSCOPY WITH PROPOFOL;  Surgeon: Pasty Spillers, MD;  Location: [facility];  Service: Endoscopy;  Laterality: N/A;   KNEE SURGERY Right    TUBAL LIGATION     Family History  Problem Relation Age of Onset   Stroke Mother    Heart failure Mother    Heart disease Mother    Hypertension Mother    Thyroid disease Mother    Depression Mother    Anxiety disorder Mother    Obesity Mother    Heart attack Father 90   Hypertension Father    Hyperlipidemia Father    Alzheimer's disease Father    Heart disease  Father    Obesity Father    Cancer Paternal Uncle        prostate   Diabetes Maternal Grandmother    Diabetes Paternal Grandmother    CAD Cousin 37       passed   Breast cancer Neg Hx    Social History   Socioeconomic History   Marital status: Married    Spouse name: Tinnie Gens   Number of children: Not on file   Years of education: Not on file   Highest education level: Not on file  Occupational History   Occupation: retired from [employer] 30 yrs of service  Tobacco Use   Smoking status: Former    Packs/day: 0.25    Years: 10.00    Total pack years: 2.50    Types: Cigarettes    Quit date: 07/02/1995    Years since quitting: 26.8   Smokeless tobacco: Never  Vaping Use   Vaping Use: Never used  Substance and Sexual Activity   Alcohol use: No    Alcohol/week: 0.0 standard drinks of alcohol   Drug use: No   Sexual activity: Yes    Partners: Male  Other Topics Concern   Not on file  Social History Narrative   Not on file   Social Determinants of Health   Financial Resource Strain: Not on file  Food Insecurity: Not on file  Transportation Needs: Not on file  Physical Activity: Not on file  Stress: Not on file  Social Connections: Not on file    Tobacco Counseling Counseling given: Not Answered   Clinical Intake:                 Diabetic?  no         Activities of Daily Living    03/13/2022   11:15 AM  In your present state of health, do you have any difficulty performing the following activities:  Hearing? 0  Vision? 0  Difficulty concentrating or making decisions? 0  Walking or climbing stairs? 0  Dressing or bathing? 0  Doing errands, shopping? 0    Patient Care Team: Dorcas Carrow, DO as PCP - General (Family Medicine) Kandyce Rud., MD (Rheumatology)  Indicate any recent Medical Services you may have received from other than [facility] providers in the past year (date may be approximate).     Assessment:   This is a  routine wellness examination for Emily [name].  Hearing/Vision screen No results found.  Dietary issues and exercise activities discussed:     Goals Addressed   None    Depression Screen    03/13/2022   11:15 AM 02/10/2022    2:40 PM 12/10/2021    9:16 AM 11/12/2021    2:37 PM 10/24/2021   10:12 AM 10/15/2021  4:28 PM 03/04/2021    1:11 PM  PHQ 2/9 Scores  PHQ - 2 Score 2 0 0 2 5 2 1   PHQ- 9 Score 5 2 5 7 12 12 5     Fall Risk    03/13/2022   11:15 AM 02/10/2022    2:40 PM 03/04/2021    1:11 PM 01/21/2021    3:09 PM 02/28/2020    4:23 PM  Fall Risk   Falls in the past year? 0 0 0 0 0  Number falls in past yr: 0 0 0  0  Injury with Fall? 0 0 0  0  Risk for fall due to : No Fall Risks No Fall Risks No Fall Risks    Follow up Falls evaluation completed Falls evaluation completed Falls evaluation completed      FALL RISK PREVENTION PERTAINING TO THE HOME:  Any stairs in or around the home? Yes  If so, are there any without handrails? No  Home free of loose throw rugs in walkways, pet beds, electrical cords, etc? Yes  Adequate lighting in your home to reduce risk of falls? Yes   ASSISTIVE DEVICES UTILIZED TO PREVENT FALLS:  Life alert? No  Use of a cane, walker or w/c? No  Grab bars in the bathroom? No  Shower chair or bench in shower? No  Elevated toilet seat or a handicapped toilet? No   TIMED UP AND GO:  Was the test performed? No .    Cognitive Function:        Immunizations Immunization History  Administered Date(s) Administered   Influenza,inj,Quad PF,6+ Mos 08/27/2015, 07/21/2017, 08/26/2018, 10/15/2021   Pneumococcal Polysaccharide-23 04/07/2016   Tdap 07/25/2009, 02/28/2020   Zoster Recombinat (Shingrix) 03/13/2022    TDAP status: Up to date  Flu Vaccine status: Up to date  Pneumococcal vaccine status: Up to date  Covid-19 vaccine status: Information provided on how to obtain vaccines.   Qualifies for Shingles Vaccine? Yes   Zostavax completed No    Shingrix Completed?: Yes  Screening Tests Health Maintenance  Topic Date Due   COVID-19 Vaccine (1) Never done   Zoster Vaccines- Shingrix (2 of 2) 05/08/2022   INFLUENZA VACCINE  05/20/2022   MAMMOGRAM  12/07/2023   PAP SMEAR-Modifier  03/04/2026   TETANUS/TDAP  02/27/2030   COLONOSCOPY (Pts 45-57yrs Insurance coverage will need to be confirmed)  05/31/2031   Hepatitis C Screening  Completed   HIV Screening  Completed   HPV VACCINES  Aged Out    Health Maintenance  Health Maintenance Due  Topic Date Due   COVID-19 Vaccine (1) Never done    Colorectal cancer screening: Type of screening: Colonoscopy. Completed 2022. Repeat every 10 years  Mammogram status: Completed 2023. Repeat every year  Bone Density status: Ordered information provided to call. Pt provided with contact info and advised to call to schedule appt.  Lung Cancer Screening: (Low Dose CT Chest recommended if Age 62-80 years, 30 pack-year currently smoking OR have quit w/in 15years.) does not qualify.   Lung Cancer Screening Referral:   Additional Screening:  Hepatitis C Screening: does not qualify; Completed 2017  Vision Screening: Recommended annual ophthalmology exams for early detection of glaucoma and other disorders of the eye. Is the patient up to date with their annual eye exam?  Yes  Who is the provider or what is the name of the office in which the patient attends annual eye exams? [facility] If pt is not established with a provider, would  they like to be referred to a provider to establish care? No .   Dental Screening: Recommended annual dental exams for proper oral hygiene  Community Resource Referral / Chronic Care Management: CRR required this visit?  No   CCM required this visit?  No      Plan:     I have personally reviewed and noted the following in the patient's chart:   Medical and social history Use of alcohol, tobacco or illicit drugs  Current medications and  supplements including opioid prescriptions. Patient is not currently taking opioid prescriptions. Functional ability and status Nutritional status Physical activity Advanced directives List of other physicians Hospitalizations, surgeries, and ER visits in previous 12 months Vitals Screenings to include cognitive, depression, and falls Referrals and appointments  In addition, I have reviewed and discussed with patient certain preventive protocols, quality metrics, and best practice recommendations. A written personalized care plan for preventive services as well as general preventive health recommendations were provided to patient.     Remi Haggard, LPN   12/24/6438   Nurse Notes:

## 2022-04-28 ENCOUNTER — Other Ambulatory Visit (INDEPENDENT_AMBULATORY_CARE_PROVIDER_SITE_OTHER): Payer: Self-pay | Admitting: Nurse Practitioner

## 2022-04-28 DIAGNOSIS — R632 Polyphagia: Secondary | ICD-10-CM

## 2022-04-29 ENCOUNTER — Other Ambulatory Visit (INDEPENDENT_AMBULATORY_CARE_PROVIDER_SITE_OTHER): Payer: Self-pay | Admitting: Bariatrics

## 2022-04-29 DIAGNOSIS — R632 Polyphagia: Secondary | ICD-10-CM

## 2022-04-29 MED ORDER — TRULICITY 1.5 MG/0.5ML ~~LOC~~ SOAJ: 1.5000 mg | SUBCUTANEOUS | 0 refills | Status: DC

## 2022-04-29 NOTE — Telephone Encounter (Signed)
Dr.Brown 

## 2022-04-29 NOTE — Telephone Encounter (Signed)
LAST APPOINTMENT DATE: 03/31/22 NEXT APPOINTMENT DATE: 05/01/22   [facility][id number] [place], [place] - [address] AT [facility] OF [address] [place] [address] [place] [address] Phone: [phone] Fax: [phone]  [facility] - [place], [place] - [address] [place] [address] Phone: [phone] Fax: [phone]  Patient is requesting a refill of the following medications: Pending Prescriptions:                       Disp   Refills   Dulaglutide (TRULICITY) 1.5 MG/0.5ML SOPN  2 mL   0       Sig: Inject 1.5 mg into the skin once a week.   Date last filled: 03/10/22 Previously prescribed by Irene Limbo  Lab Results      Component                Value               Date                      HGBA1C                   5.2                 03/13/2022                HGBA1C                   5.9 (H)             10/15/2021                HGBA1C                   5.5                 01/21/2021           Lab Results      Component                Value               Date                      MICROALBUR               80 (H)              03/13/2022                LDLCALC                  129 (H)             03/13/2022                CREATININE               0.95                03/13/2022           Lab Results      Component                Value               Date  VD25OH                   30.1                03/13/2022                VD25OH                   25.7 (L)            10/24/2021                VD25OH                   30.6                10/15/2021            BP Readings from Last 3 Encounters: 03/31/22 : 130/69 03/13/22 : 117/71 03/10/22 : 117/80

## 2022-05-01 ENCOUNTER — Encounter (INDEPENDENT_AMBULATORY_CARE_PROVIDER_SITE_OTHER): Payer: Self-pay | Admitting: Nurse Practitioner

## 2022-05-01 ENCOUNTER — Ambulatory Visit (INDEPENDENT_AMBULATORY_CARE_PROVIDER_SITE_OTHER): Payer: Medicare Other | Admitting: Nurse Practitioner

## 2022-05-01 VITALS — BP 123/79 | HR 66 | Temp 97.7°F | Ht 65.0 in | Wt 190.0 lb

## 2022-05-01 DIAGNOSIS — E669 Obesity, unspecified: Secondary | ICD-10-CM

## 2022-05-01 DIAGNOSIS — Z6831 Body mass index (BMI) 31.0-31.9, adult: Secondary | ICD-10-CM | POA: Diagnosis not present

## 2022-05-01 DIAGNOSIS — E559 Vitamin D deficiency, unspecified: Secondary | ICD-10-CM | POA: Diagnosis not present

## 2022-05-01 DIAGNOSIS — R632 Polyphagia: Secondary | ICD-10-CM

## 2022-05-01 MED ORDER — TRULICITY 1.5 MG/0.5ML ~~LOC~~ SOAJ: 1.5000 mg | SUBCUTANEOUS | 0 refills | Status: DC

## 2022-05-01 MED ORDER — VITAMIN D (ERGOCALCIFEROL) 1.25 MG (50000 UNIT) PO CAPS: 50000.0000 [IU] | ORAL_CAPSULE | ORAL | 0 refills | Status: DC

## 2022-05-05 ENCOUNTER — Other Ambulatory Visit (INDEPENDENT_AMBULATORY_CARE_PROVIDER_SITE_OTHER): Payer: Self-pay | Admitting: Nurse Practitioner

## 2022-05-05 DIAGNOSIS — R632 Polyphagia: Secondary | ICD-10-CM

## 2022-05-05 MED ORDER — TRULICITY 1.5 MG/0.5ML ~~LOC~~ SOAJ: 1.5000 mg | SUBCUTANEOUS | 0 refills | Status: DC

## 2022-05-05 NOTE — Progress Notes (Signed)
Chief Complaint:   OBESITY Emily [name] is here to discuss her progress with her obesity treatment plan along with follow-up of her obesity related diagnoses. Emily [name] is on the Category 2 Plan and states she is following her eating plan approximately 0% of the time. Emily [name] states she is doing yard work 1 times per week.  Today's visit was #: 9 Starting weight: 218 lbs Starting date: 10/24/2021 Today's weight: 190 lbs Today's date: 05/01/2022 Total lbs lost to date: 28 lbs Total lbs lost since last in-office visit: 1  Interim History: Emily [name] has had multiple celebrations since her last visit. She had a cook out yesterday. She has gotten off track due to celebrations. No celebrations or vacation over the next month. She is disappointed in weight gain. Plans to get back on track and start walking again. She is drinking water and coffee. Struggling with cravings and skipping meals.   Subjective:   1. Vitamin D insufficiency Emily [name] is currently taking prescription Vit D 50,000 IU once a week. Denies any nausea, vomiting or muscle weakness.  2. Polyphagia Emily [name] is currently Trulicity 1.5 mg. Denies hunger and craving when following the plan.  Assessment/Plan:   1. Vitamin D insufficiency We will refill Vit D 50,000 IU once a week for 1 month with 0 refills.  Low Vitamin D level contributes to fatigue and are associated with obesity, breast, and colon cancer. She agrees to continue to take prescription Vitamin D @50 ,000 IU every week and will follow-up for routine testing of Vitamin D, at least 2-3 times per year to avoid over-replacement.   -Refill Vitamin D, Ergocalciferol, (DRISDOL) 1.25 MG (50000 UNIT) CAPS capsule; Take 1 capsule (50,000 Units total) by mouth every 7 (seven) days.  Dispense: 4 capsule; Refill: 0  2. Polyphagia We will refill Trulicity 1.5 mg SubQ once a week for 1 month with 0 refills.  -Refill Dulaglutide (TRULICITY) 1.5  MG/0.5ML SOPN; Inject 1.5 mg into the skin once a week.  Dispense: 2 mL; Refill: 0  3. Obesity, Current BMI 31.8 Emily [name] is currently in the action stage of change. As such, her goal is to continue with weight loss efforts. She has agreed to the Category 2 Plan.   Exercise goals: All adults should avoid inactivity. Some physical activity is better than none, and adults who participate in any amount of physical activity gain some health benefits.  Behavioral modification strategies: increasing lean protein intake, increasing water intake, and planning for success.  Emily [name] has agreed to follow-up with our clinic in 3 weeks. She was informed of the importance of frequent follow-up visits to maximize her success with intensive lifestyle modifications for her multiple health conditions.   Objective:   Blood pressure 123/79, pulse 66, temperature 97.7 F (36.5 C), height 5\' 5"  (1.651 m), weight 190 lb (86.2 kg), SpO2 98 %. Body mass index is 31.62 kg/m.  General: Cooperative, alert, well developed, in no acute distress. HEENT: Conjunctivae and lids unremarkable. Cardiovascular: Regular rhythm.  Lungs: Normal work of breathing. Neurologic: No focal deficits.   Lab Results  Component Value Date   CREATININE 0.95 03/13/2022   BUN 21 03/13/2022   NA 140 03/13/2022   K 4.1 03/13/2022   CL 100 03/13/2022   CO2 23 03/13/2022   Lab Results  Component Value Date   ALT  23 03/13/2022   AST 18 03/13/2022   ALKPHOS 72 03/13/2022   BILITOT 0.4 03/13/2022   Lab Results  Component Value Date   HGBA1C 5.2 03/13/2022   HGBA1C 5.9 (H) 10/15/2021   HGBA1C 5.5 01/21/2021   HGBA1C 5.6 02/28/2020   HGBA1C 5.6 06/07/2019   Lab Results  Component Value Date   INSULIN 21.9 10/24/2021   INSULIN 33.8 (H) 11/17/2018   Lab Results  Component Value Date   TSH 1.830 03/13/2022   Lab Results  Component Value Date   CHOL 200 (H) 03/13/2022   HDL 44 03/13/2022   LDLCALC 129 (H) 03/13/2022   TRIG 149  03/13/2022   Lab Results  Component Value Date   VD25OH 30.1 03/13/2022   VD25OH 25.7 (L) 10/24/2021   VD25OH 30.6 10/15/2021   Lab Results  Component Value Date   WBC 6.6 03/13/2022   HGB 15.2 03/13/2022   HCT 44.0 03/13/2022   MCV 92 03/13/2022   PLT 262 03/13/2022   No results found for: "IRON", "TIBC", "FERRITIN"  Attestation Statements:   Reviewed by clinician on day of visit: allergies, medications, problem list, medical history, surgical history, family history, social history, and previous encounter notes.  I, Brendell Tyus, RMA, am acting as transcriptionist for Irene Limbo, FNP.  I have reviewed the above documentation for accuracy and completeness, and I agree with the above. Irene Limbo, FNP

## 2022-05-07 ENCOUNTER — Telehealth (INDEPENDENT_AMBULATORY_CARE_PROVIDER_SITE_OTHER): Payer: Self-pay

## 2022-05-07 NOTE — Telephone Encounter (Signed)
Patient was seen on 7/13 by Judeth Cornfield.  Patient states she need her Vitamin D, Ergocalciferol, (DRISDOL) 1.25 MG (50000 UNIT) CAPS capsule, Dulaglutide (TRULICITY) 1.5 MG/0.5ML SOPN  Sent to :  [facility] - [place], [place] - [address] Phone:  [phone]  Fax:  [phone]    Her insurance will no longer cover the medications at [facility].

## 2022-05-14 ENCOUNTER — Other Ambulatory Visit (INDEPENDENT_AMBULATORY_CARE_PROVIDER_SITE_OTHER): Payer: Self-pay

## 2022-05-14 DIAGNOSIS — E559 Vitamin D deficiency, unspecified: Secondary | ICD-10-CM

## 2022-05-14 DIAGNOSIS — R632 Polyphagia: Secondary | ICD-10-CM

## 2022-05-14 MED ORDER — TRULICITY 1.5 MG/0.5ML ~~LOC~~ SOAJ: 1.5000 mg | SUBCUTANEOUS | 0 refills | Status: DC

## 2022-05-14 MED ORDER — VITAMIN D (ERGOCALCIFEROL) 1.25 MG (50000 UNIT) PO CAPS: 50000.0000 [IU] | ORAL_CAPSULE | ORAL | 0 refills | Status: DC

## 2022-05-14 NOTE — Telephone Encounter (Signed)
Sent to pharmacy below.

## 2022-05-19 DIAGNOSIS — I5032 Chronic diastolic (congestive) heart failure: Secondary | ICD-10-CM | POA: Diagnosis not present

## 2022-05-19 DIAGNOSIS — I25701 Atherosclerosis of coronary artery bypass graft(s), unspecified, with angina pectoris with documented spasm: Secondary | ICD-10-CM | POA: Diagnosis not present

## 2022-05-19 DIAGNOSIS — I700 Atherosclerosis of aorta: Secondary | ICD-10-CM | POA: Diagnosis not present

## 2022-05-19 DIAGNOSIS — E1169 Type 2 diabetes mellitus with other specified complication: Secondary | ICD-10-CM | POA: Diagnosis not present

## 2022-05-20 ENCOUNTER — Other Ambulatory Visit: Payer: Medicare Other

## 2022-05-28 ENCOUNTER — Encounter (INDEPENDENT_AMBULATORY_CARE_PROVIDER_SITE_OTHER): Payer: Self-pay

## 2022-05-29 ENCOUNTER — Other Ambulatory Visit: Payer: Self-pay | Admitting: Family Medicine

## 2022-05-29 NOTE — Telephone Encounter (Signed)
Requested Prescriptions  Pending Prescriptions Disp Refills  . losartan (COZAAR) 100 MG tablet [Pharmacy Med Name: LOSARTAN TABS 100MG ] 90 tablet 1    Sig: TAKE 1 TABLET DAILY     Cardiovascular:  Angiotensin Receptor Blockers Passed - 05/29/2022 12:29 AM      Passed - Cr in normal range and within 180 days    Creatinine  Date Value Ref Range Status  10/24/2014 0.76 0.60 - 1.30 mg/dL Final   Creatinine, Ser  Date Value Ref Range Status  03/13/2022 0.95 0.57 - 1.00 mg/dL Final         Passed - K in normal range and within 180 days    Potassium  Date Value Ref Range Status  03/13/2022 4.1 3.5 - 5.2 mmol/L Final  10/24/2014 3.9 3.5 - 5.1 mmol/L Final         Passed - Patient is not pregnant      Passed - Last BP in normal range    BP Readings from Last 1 Encounters:  05/01/22 123/79         Passed - Valid encounter within last 6 months    Recent Outpatient Visits          2 months ago Routine general medical examination at a health care facility   [facility], Megan P, DO   3 months ago Pain with urination   [facility] [place], Erin E, PA-C   5 months ago Chronic low back pain with sciatica, sciatica laterality unspecified, unspecified back pain laterality   [facility] [place], Megan P, DO   6 months ago Fever, unspecified fever cause   [facility] [place], [place], DO   7 months ago Other specified hypothyroidism   [place], [place], DO      Future Appointments            In 3 months Johnson, [place] yan, DO Oralia Rud, [facility]

## 2022-06-03 ENCOUNTER — Other Ambulatory Visit (INDEPENDENT_AMBULATORY_CARE_PROVIDER_SITE_OTHER): Payer: Self-pay | Admitting: Nurse Practitioner

## 2022-06-03 ENCOUNTER — Encounter (INDEPENDENT_AMBULATORY_CARE_PROVIDER_SITE_OTHER): Payer: Self-pay | Admitting: Nurse Practitioner

## 2022-06-03 ENCOUNTER — Ambulatory Visit (INDEPENDENT_AMBULATORY_CARE_PROVIDER_SITE_OTHER): Payer: Medicare Other | Admitting: Nurse Practitioner

## 2022-06-03 VITALS — BP 123/75 | HR 64 | Temp 98.1°F | Ht 65.0 in | Wt 187.0 lb

## 2022-06-03 DIAGNOSIS — Z6831 Body mass index (BMI) 31.0-31.9, adult: Secondary | ICD-10-CM

## 2022-06-03 DIAGNOSIS — E038 Other specified hypothyroidism: Secondary | ICD-10-CM

## 2022-06-03 DIAGNOSIS — R5383 Other fatigue: Secondary | ICD-10-CM

## 2022-06-03 DIAGNOSIS — E669 Obesity, unspecified: Secondary | ICD-10-CM

## 2022-06-03 DIAGNOSIS — E559 Vitamin D deficiency, unspecified: Secondary | ICD-10-CM | POA: Diagnosis not present

## 2022-06-03 DIAGNOSIS — R632 Polyphagia: Secondary | ICD-10-CM | POA: Diagnosis not present

## 2022-06-03 MED ORDER — VITAMIN D (ERGOCALCIFEROL) 1.25 MG (50000 UNIT) PO CAPS: 50000.0000 [IU] | ORAL_CAPSULE | ORAL | 0 refills | Status: DC

## 2022-06-03 MED ORDER — TRULICITY 1.5 MG/0.5ML ~~LOC~~ SOAJ: 1.5000 mg | SUBCUTANEOUS | 0 refills | Status: DC

## 2022-06-04 LAB — CBC WITH DIFFERENTIAL/PLATELET
Basophils Absolute: 0.1 10*3/uL (ref 0.0–0.2)
Basos: 1 %
EOS (ABSOLUTE): 0.2 10*3/uL (ref 0.0–0.4)
Eos: 3 %
Hematocrit: 41.3 % (ref 34.0–46.6)
Hemoglobin: 14 g/dL (ref 11.1–15.9)
Immature Grans (Abs): 0 10*3/uL (ref 0.0–0.1)
Immature Granulocytes: 0 %
Lymphocytes Absolute: 3.5 10*3/uL — ABNORMAL HIGH (ref 0.7–3.1)
Lymphs: 45 %
MCH: 31.7 pg (ref 26.6–33.0)
MCHC: 33.9 g/dL (ref 31.5–35.7)
MCV: 93 fL (ref 79–97)
Monocytes Absolute: 0.5 10*3/uL (ref 0.1–0.9)
Monocytes: 7 %
Neutrophils Absolute: 3.3 10*3/uL (ref 1.4–7.0)
Neutrophils: 44 %
Platelets: 245 10*3/uL (ref 150–450)
RBC: 4.42 x10E6/uL (ref 3.77–5.28)
RDW: 11.8 % (ref 11.7–15.4)
WBC: 7.7 10*3/uL (ref 3.4–10.8)

## 2022-06-04 LAB — COMPREHENSIVE METABOLIC PANEL
ALT: 16 IU/L (ref 0–32)
AST: 20 IU/L (ref 0–40)
Albumin/Globulin Ratio: 1.5 (ref 1.2–2.2)
Albumin: 4.6 g/dL (ref 3.9–4.9)
Alkaline Phosphatase: 81 IU/L (ref 44–121)
BUN/Creatinine Ratio: 27 (ref 12–28)
BUN: 17 mg/dL (ref 8–27)
Bilirubin Total: 0.4 mg/dL (ref 0.0–1.2)
CO2: 26 mmol/L (ref 20–29)
Calcium: 10.2 mg/dL (ref 8.7–10.3)
Chloride: 99 mmol/L (ref 96–106)
Creatinine, Ser: 0.62 mg/dL (ref 0.57–1.00)
Globulin, Total: 3 g/dL (ref 1.5–4.5)
Glucose: 81 mg/dL (ref 70–99)
Potassium: 4.1 mmol/L (ref 3.5–5.2)
Sodium: 139 mmol/L (ref 134–144)
Total Protein: 7.6 g/dL (ref 6.0–8.5)
eGFR: 101 mL/min/{1.73_m2} (ref >59)

## 2022-06-04 LAB — T4, FREE: Free T4: 1.62 ng/dL (ref 0.82–1.77)

## 2022-06-04 LAB — VITAMIN B12: Vitamin B-12: 843 pg/mL (ref 232–1245)

## 2022-06-04 LAB — TSH: TSH: 0.593 u[IU]/mL (ref 0.450–4.500)

## 2022-06-04 LAB — FOLATE: Folate: >20 ng/mL (ref >3.0)

## 2022-06-04 LAB — VITAMIN D 25 HYDROXY (VIT D DEFICIENCY, FRACTURES): Vit D, 25-Hydroxy: 61.1 ng/mL (ref 30.0–100.0)

## 2022-06-04 LAB — T3: T3, Total: 92 ng/dL (ref 71–180)

## 2022-06-09 ENCOUNTER — Ambulatory Visit (INDEPENDENT_AMBULATORY_CARE_PROVIDER_SITE_OTHER): Payer: Medicare Other

## 2022-06-09 DIAGNOSIS — Z23 Encounter for immunization: Secondary | ICD-10-CM

## 2022-06-10 ENCOUNTER — Encounter: Payer: Self-pay | Admitting: Family Medicine

## 2022-06-10 ENCOUNTER — Ambulatory Visit: Payer: Medicare Other | Admitting: Family Medicine

## 2022-06-10 ENCOUNTER — Ambulatory Visit: Payer: Self-pay

## 2022-06-10 NOTE — Telephone Encounter (Signed)
  Chief Complaint: Vaccine reaction Symptoms: Fever - unknown reading, Flu like s/s, BA Frequency: Last night 1am Pertinent Negatives: Patient denies any infection Disposition: [] ED /[] Urgent Care (no appt availability in office) / [] Appointment(In office/virtual)/ []  [facility]/ [x] Home Care/ [] Refused Recommended Disposition /[] [facility]/ []  Follow-up with PCP Additional Notes: S/W husband. PT received 2nd shingles vaccine yesterday and at 1am began with fever, BA, and flu like symptoms.  They will treat at home with tylenol and call back if needed. Husband was unable to locate thermometer at this time.     Reason for Disposition  Shingles (Herpes zoster; Shingrix) vaccine reactions  Answer Assessment - Initial Assessment Questions 1. SYMPTOMS: "What is the main symptom?" (e.g., redness, swelling, pain)      Fever, Achy 2. ONSET: "When was the vaccine (shot) given?" "How much later did the *No Answer* begin?" (e.g., hours, days ago)      yesterday 3. SEVERITY: "How bad is it?"      moderate 4. FEVER: "Is there a fever?" If Yes, ask: "What is it, how was it measured, and when did it start?"      Yes - 1 am 5. IMMUNIZATIONS GIVEN: "What shots have you recently received?"     Shingles 6. PAST REACTIONS: "Have you reacted to immunizations before?" If Yes, ask: "What happened?"     no 7. OTHER SYMPTOMS: "Do you have any other symptoms?"     Achy, Flu like s/s  Protocols used: Immunization Reactions-A-AH

## 2022-06-10 NOTE — Telephone Encounter (Signed)
FYI for appointment this morning.  

## 2022-06-11 NOTE — Progress Notes (Unsigned)
Chief Complaint:   OBESITY Emily [name] is here to discuss her progress with her obesity treatment plan along with follow-up of her obesity related diagnoses. Fabian is on the Category 2 Plan and states she is following her eating plan approximately 50% of the time. Brieanna states she is swimming 30 minutes 3 times per week.  Today's visit was #: 10 Starting weight: 218 lbs Starting date: 10/24/2021 Today's weight: 187 lbs Today's date: 06/03/2022 Total lbs lost to date: 31 lbs Total lbs lost since last in-office visit: 3  Interim History: Emily [name] has done well with weight loss. She is not skipping meals and not snacking in the evenings. Some hunger and cravings. She is eating out 3 days per week. Drinking water and occasional Diet green tea.   Subjective:   1. Other fatigue Journee reports no energy for the past 6 months. Goes to bed 1-2 am and gets up 10-11 am. No observed sleep apnea or snoring. Unsure of PLMS.   2. Other specified hypothyroidism Emily [name] is currently taking Synthroid 137 mcg. Denies any side effects. She has not missed any doses.  3. Vitamin D insufficiency Emily [name] is currently taking prescription Vit D 50,000 IU once a week. Denies any nausea, vomiting or muscle weakness.  4. Polyphagia Emily [name] is currently taking Trulicity 1.5 mg. Denies any side effects.  Assessment/Plan:   1. Other fatigue We will obtain labs today.  - Comprehensive metabolic panel - CBC with Differential/Platelet - TSH - T4, free - T3 - Vitamin B12 - VITAMIN D 25 Hydroxy (Vit-D Deficiency, Fractures) - Folate  2. Other specified hypothyroidism We will obtain labs today.  - Comprehensive metabolic panel - TSH - T4, free - T3  3. Vitamin D insufficiency We will obtain labs today. We will refill Vit D 50,000 IU once a week for 1 month with 0 refills. Side effects discussed.   - Comprehensive metabolic panel - VITAMIN D 25 Hydroxy (Vit-D Deficiency, Fractures)  -Refill Vitamin D,  Ergocalciferol, (DRISDOL) 1.25 MG (50000 UNIT) CAPS capsule; Take 1 capsule (50,000 Units total) by mouth every 7 (seven) days.  Dispense: 4 capsule; Refill: 0  4. Polyphagia We will refill Trulicity 1.5 mg SubQ once weekly for 1 month with 0 refills. Side effects discussed.   -Refill Dulaglutide (TRULICITY) 1.5 MG/0.5ML SOPN; Inject 1.5 mg into the skin once a week.  Dispense: 2 mL; Refill: 0  5. Obesity, Current BMI 31.3 Emily [name] is currently in the action stage of change. As such, her goal is to continue with weight loss efforts. She has agreed to the Category 2 Plan.   Exercise goals: As is.  Behavioral modification strategies: increasing lean protein intake, increasing water intake, and planning for success.  Emily [name] has agreed to follow-up with our clinic in 4 weeks. She was informed of the importance of frequent follow-up visits to maximize her success with intensive lifestyle modifications for her multiple health conditions.   Emily [name] was informed we would discuss her lab results at her next visit unless there is a critical issue that needs to be addressed sooner. Emily [name] agreed to keep her next visit at the agreed upon time to discuss these results.  Objective:   Blood pressure 123/75, pulse 64, temperature 98.1 F (36.7 C), height 5\' 5"  (1.651 m), weight 187 lb (84.8 kg), SpO2 94 %. Body mass index is 31.12 kg/m.  General: Cooperative, alert, well developed, in no acute distress. HEENT: Conjunctivae and lids unremarkable. Cardiovascular: Regular rhythm.  Lungs: Normal work  of breathing. Neurologic: No focal deficits.   Lab Results  Component Value Date   CREATININE 0.62 06/03/2022   BUN 17 06/03/2022   NA 139 06/03/2022   K 4.1 06/03/2022   CL 99 06/03/2022   CO2 26 06/03/2022   Lab Results  Component Value Date   ALT 16 06/03/2022   AST 20 06/03/2022   ALKPHOS 81 06/03/2022   BILITOT 0.4 06/03/2022   Lab Results  Component Value Date   HGBA1C 5.2 03/13/2022   HGBA1C  5.9 (H) 10/15/2021   HGBA1C 5.5 01/21/2021   HGBA1C 5.6 02/28/2020   HGBA1C 5.6 06/07/2019   Lab Results  Component Value Date   INSULIN 21.9 10/24/2021   INSULIN 33.8 (H) 11/17/2018   Lab Results  Component Value Date   TSH 0.593 06/03/2022   Lab Results  Component Value Date   CHOL 200 (H) 03/13/2022   HDL 44 03/13/2022   LDLCALC 129 (H) 03/13/2022   TRIG 149 03/13/2022   Lab Results  Component Value Date   VD25OH 61.1 06/03/2022   VD25OH 30.1 03/13/2022   VD25OH 25.7 (L) 10/24/2021   Lab Results  Component Value Date   WBC 7.7 06/03/2022   HGB 14.0 06/03/2022   HCT 41.3 06/03/2022   MCV 93 06/03/2022   PLT 245 06/03/2022   No results found for: "IRON", "TIBC", "FERRITIN"  Attestation Statements:   Reviewed by clinician on day of visit: allergies, medications, problem list, medical history, surgical history, family history, social history, and previous encounter notes.  I, Brendell Tyus, am acting as transcriptionist for Irene Limbo, FNP.  I have reviewed the above documentation for accuracy and completeness, and I agree with the above. Irene Limbo, FNP

## 2022-06-13 ENCOUNTER — Ambulatory Visit: Payer: BC Managed Care – PPO

## 2022-07-01 ENCOUNTER — Encounter (INDEPENDENT_AMBULATORY_CARE_PROVIDER_SITE_OTHER): Payer: Self-pay | Admitting: Nurse Practitioner

## 2022-07-01 ENCOUNTER — Ambulatory Visit (INDEPENDENT_AMBULATORY_CARE_PROVIDER_SITE_OTHER): Payer: Medicare Other | Admitting: Nurse Practitioner

## 2022-07-01 VITALS — BP 123/82 | HR 67 | Temp 98.6°F | Ht 65.0 in | Wt 186.0 lb

## 2022-07-01 DIAGNOSIS — E669 Obesity, unspecified: Secondary | ICD-10-CM

## 2022-07-01 DIAGNOSIS — R5383 Other fatigue: Secondary | ICD-10-CM

## 2022-07-01 DIAGNOSIS — E559 Vitamin D deficiency, unspecified: Secondary | ICD-10-CM

## 2022-07-01 DIAGNOSIS — R632 Polyphagia: Secondary | ICD-10-CM

## 2022-07-01 DIAGNOSIS — Z6831 Body mass index (BMI) 31.0-31.9, adult: Secondary | ICD-10-CM | POA: Diagnosis not present

## 2022-07-01 MED ORDER — TRULICITY 1.5 MG/0.5ML ~~LOC~~ SOAJ
1.5000 mg | SUBCUTANEOUS | 0 refills | Status: DC
Start: 2022-07-01 — End: 2022-07-29

## 2022-07-01 MED ORDER — VITAMIN D (ERGOCALCIFEROL) 1.25 MG (50000 UNIT) PO CAPS: 50000.0000 [IU] | ORAL_CAPSULE | ORAL | 0 refills | Status: DC

## 2022-07-02 NOTE — Progress Notes (Signed)
Chief Complaint:   OBESITY Emily [name] is here to discuss her progress with her obesity treatment plan along with follow-up of her obesity related diagnoses. Emily [name] is on the Category 2 Plan and states she is following her eating plan approximately 75% of the time. Emily [name] states she is exercising 0 minutes 0 times per week.  Today's visit was #: 11 Starting weight: 218 lbs Starting date: 10/24/2021 Today's weight: 186 lbs Today's date: 07/01/2022 Total lbs lost to date: 32 lbs Total lbs lost since last in-office visit: 1  Interim History: Emily [name] overall has done well with weight loss. Since her last visit she celebrated her grand daughter and her birthday. Some cravings in the evenings. Denies hunger. Skipping breakfast. She is drinking water and Green tea. Does not drink enough water.  Subjective:   1. Polyphagia Emily [name] is taking Trulicity 1.5 mg. Denies any side effects.  2. Vitamin D insufficiency Emily [name] is currently taking prescription Vit D 50,000 IU once a week.   3. Other fatigue Labs were reviewed with Emily [name] today. She is taking Effexor-XR 150 mg due to depression. She stopped Effexor- XR 75mg  6 months ago. She felt like a zombie taking both and now feels better since decreasing Effexor dose. Never seen a therapist in the past.   Assessment/Plan:   1. Polyphagia We will refill Trulicity 1.5 mg once weekly for 1 month with 0 refills. Side effects discussed.   -Refill Dulaglutide (TRULICITY) 1.5 MG/0.5ML SOPN; Inject 1.5 mg into the skin once a week.  Dispense: 2 mL; Refill: 0  2. Vitamin D insufficiency We will refill Vit D 50k IU once weekly for 1 month with 0 refills. Low Vitamin D level contributes to fatigue and are associated with obesity, breast, and colon cancer. She agrees to continue to take prescription Vitamin D @50 ,000 IU every week and will follow-up for routine testing of Vitamin D, at least 2-3 times per year to avoid over-replacement.   -Refill Vitamin D,  Ergocalciferol, (DRISDOL) 1.25 MG (50000 UNIT) CAPS capsule; Take 1 capsule (50,000 Units total) by mouth every 7 (seven) days.  Dispense: 4 capsule; Refill: 0  3. Other fatigue Discuss with PCP. We discussed referral to a therapist and PSG.  4. Obesity, Current BMI 31.1 Emily [name] is currently in the action stage of change. As such, her goal is to continue with weight loss efforts. She has agreed to the Category 2 Plan.   Exercise goals: All adults should avoid inactivity. Some physical activity is better than none, and adults who participate in any amount of physical activity gain some health benefits.  Behavioral modification strategies: increasing water intake, planning for success, and keeping a strict food journal.  Emily [name] has agreed to follow-up with our clinic in 4 weeks. She was informed of the importance of frequent follow-up visits to maximize her success with intensive lifestyle modifications for her multiple health conditions.   Objective:   Blood pressure 123/82, pulse 67, temperature 98.6 F (37 C), height 5\' 5"  (1.651 m), weight 186 lb (84.4 kg), SpO2 95 %. Body mass index is 30.95 kg/m.  General: Cooperative, alert, well developed, in no acute distress. HEENT: Conjunctivae and lids unremarkable. Cardiovascular: Regular rhythm.  Lungs: Normal work of breathing. Neurologic: No focal deficits.   Lab Results  Component Value Date   CREATININE 0.62 06/03/2022   BUN 17 06/03/2022   NA 139 06/03/2022   K 4.1 06/03/2022   CL 99 06/03/2022   CO2 26 06/03/2022   Lab  Results  Component Value Date   ALT 16 06/03/2022   AST 20 06/03/2022   ALKPHOS 81 06/03/2022   BILITOT 0.4 06/03/2022   Lab Results  Component Value Date   HGBA1C 5.2 03/13/2022   HGBA1C 5.9 (H) 10/15/2021   HGBA1C 5.5 01/21/2021   HGBA1C 5.6 02/28/2020   HGBA1C 5.6 06/07/2019   Lab Results  Component Value Date   INSULIN 21.9 10/24/2021   INSULIN 33.8 (H) 11/17/2018   Lab Results  Component Value  Date   TSH 0.593 06/03/2022   Lab Results  Component Value Date   CHOL 200 (H) 03/13/2022   HDL 44 03/13/2022   LDLCALC 129 (H) 03/13/2022   TRIG 149 03/13/2022   Lab Results  Component Value Date   VD25OH 61.1 06/03/2022   VD25OH 30.1 03/13/2022   VD25OH 25.7 (L) 10/24/2021   Lab Results  Component Value Date   WBC 7.7 06/03/2022   HGB 14.0 06/03/2022   HCT 41.3 06/03/2022   MCV 93 06/03/2022   PLT 245 06/03/2022   No results found for: "IRON", "TIBC", "FERRITIN"  Attestation Statements:   Reviewed by clinician on day of visit: allergies, medications, problem list, medical history, surgical history, family history, social history, and previous encounter notes.  I, Brendell Tyus, RMA, am acting as transcriptionist for Irene Limbo, FNP.  I have reviewed the above documentation for accuracy and completeness, and I agree with the above. Irene Limbo, FNP

## 2022-07-09 ENCOUNTER — Ambulatory Visit
Admission: RE | Admit: 2022-07-09 | Discharge: 2022-07-09 | Disposition: A | Discharge status: Home / Self Care | Payer: Medicare Other | Source: Ambulatory Visit | Attending: Nurse Practitioner | Admitting: Nurse Practitioner

## 2022-07-09 DIAGNOSIS — N959 Unspecified menopausal and perimenopausal disorder: Secondary | ICD-10-CM | POA: Insufficient documentation

## 2022-07-09 DIAGNOSIS — M85832 Other specified disorders of bone density and structure, left forearm: Secondary | ICD-10-CM | POA: Diagnosis not present

## 2022-07-09 DIAGNOSIS — E559 Vitamin D deficiency, unspecified: Secondary | ICD-10-CM | POA: Insufficient documentation

## 2022-07-09 DIAGNOSIS — Z780 Asymptomatic menopausal state: Secondary | ICD-10-CM | POA: Diagnosis not present

## 2022-07-29 ENCOUNTER — Encounter: Payer: Self-pay | Admitting: Nurse Practitioner

## 2022-07-29 ENCOUNTER — Ambulatory Visit (INDEPENDENT_AMBULATORY_CARE_PROVIDER_SITE_OTHER): Payer: Medicare Other | Admitting: Nurse Practitioner

## 2022-07-29 VITALS — BP 128/65 | HR 65 | Temp 98.3°F | Ht 65.0 in | Wt 189.0 lb

## 2022-07-29 DIAGNOSIS — R632 Polyphagia: Secondary | ICD-10-CM

## 2022-07-29 DIAGNOSIS — Z6831 Body mass index (BMI) 31.0-31.9, adult: Secondary | ICD-10-CM

## 2022-07-29 DIAGNOSIS — E669 Obesity, unspecified: Secondary | ICD-10-CM

## 2022-07-29 DIAGNOSIS — E559 Vitamin D deficiency, unspecified: Secondary | ICD-10-CM | POA: Diagnosis not present

## 2022-07-29 MED ORDER — VITAMIN D (ERGOCALCIFEROL) 1.25 MG (50000 UNIT) PO CAPS: ORAL_CAPSULE | ORAL | 0 refills | Status: DC

## 2022-07-29 MED ORDER — TRULICITY 1.5 MG/0.5ML ~~LOC~~ SOAJ: 1.5000 mg | SUBCUTANEOUS | 0 refills | Status: DC

## 2022-08-04 ENCOUNTER — Other Ambulatory Visit: Payer: Self-pay | Admitting: Family Medicine

## 2022-08-04 NOTE — Progress Notes (Unsigned)
Chief Complaint:   OBESITY Emily [name] is here to discuss her progress with her obesity treatment plan along with follow-up of her obesity related diagnoses. Emily [name] is on the Category 2 Plan and states she is following her eating plan approximately 70% of the time. Emily [name] states she is doing yard work 4 hours 1 times per week.  Today's visit was #: 12 Starting weight: 218 lbs Starting date: 10/24/2021 Today's weight: 189 lbs Today's date: 07/29/2022 Total lbs lost to date: 29 lbs Total lbs lost since last in-office visit: 0  Interim History: Emily [name] has gotten off track since her last visit. Notes some stress and occasional stress eating. Plans to get back on track and follow meal plan.  Subjective:   1. Vitamin D insufficiency Labs discussed during visit today. Dexa reviewed with patient today. Shows osteopenia. Vit D changed to every 10 days, Side effects discussed.  Start Calcium over the counter and start resistance training.   2. Polyphagia Labs discussed during visit today. Taking Trulicity 1.5 mg. Denies any side effects. Denies polyphagia or cravings.   Assessment/Plan:   1. Vitamin D insufficiency We will change/refill Vit D 50,000 IU to once every 10 days for 1 month with 0 refills.  -Change/Refill Vitamin D, Ergocalciferol, (DRISDOL) 1.25 MG (50000 UNIT) CAPS capsule; Take one pill po every 10 days  Dispense: 4 capsule; Refill: 0  2. Polyphagia Labs discussed during visit today. We will refill Trulicity 1.5 mg once weekly for 1 month with 0 refills.  -Refill Dulaglutide (TRULICITY) 1.5 YT/0.1SW SOPN; Inject 1.5 mg into the skin once a week.  Dispense: 2 mL; Refill: 0  3. Obesity, Current BMI 31.6 Emily [name] is currently in the action stage of change. As such, her goal is to continue with weight loss efforts. She has agreed to the Category 2 Plan.   Exercise goals: All adults should avoid inactivity. Some physical activity is better than none, and adults who participate in any  amount of physical activity gain some health benefits.  Behavioral modification strategies: increasing lean protein intake, increasing vegetables, and planning for success.  Emily [name] has agreed to follow-up with our clinic in 4 weeks. She was informed of the importance of frequent follow-up visits to maximize her success with intensive lifestyle modifications for her multiple health conditions.   Objective:   Blood pressure 128/65, pulse 65, temperature 98.3 F (36.8 C), height 5\' 5"  (1.651 m), weight 189 lb (85.7 kg), SpO2 97 %. Body mass index is 31.45 kg/m.  General: Cooperative, alert, well developed, in no acute distress. HEENT: Conjunctivae and lids unremarkable. Cardiovascular: Regular rhythm.  Lungs: Normal work of breathing. Neurologic: No focal deficits.   Lab Results  Component Value Date   CREATININE 0.62 06/03/2022   BUN 17 06/03/2022   NA 139 06/03/2022   K 4.1 06/03/2022   CL 99 06/03/2022   CO2 26 06/03/2022   Lab Results  Component Value Date   ALT 16 06/03/2022   AST 20 06/03/2022   ALKPHOS 81 06/03/2022   BILITOT 0.4 06/03/2022   Lab Results  Component Value Date   HGBA1C 5.2 03/13/2022   HGBA1C 5.9 (H) 10/15/2021   HGBA1C 5.5 01/21/2021   HGBA1C 5.6 02/28/2020   HGBA1C 5.6 06/07/2019   Lab Results  Component Value Date   INSULIN 21.9 10/24/2021   INSULIN 33.8 (H) 11/17/2018   Lab Results  Component Value Date   TSH 0.593 06/03/2022   Lab Results  Component Value Date  CHOL 200 (H) 03/13/2022   HDL 44 03/13/2022   LDLCALC 129 (H) 03/13/2022   TRIG 149 03/13/2022   Lab Results  Component Value Date   VD25OH 61.1 06/03/2022   VD25OH 30.1 03/13/2022   VD25OH 25.7 (L) 10/24/2021   Lab Results  Component Value Date   WBC 7.7 06/03/2022   HGB 14.0 06/03/2022   HCT 41.3 06/03/2022   MCV 93 06/03/2022   PLT 245 06/03/2022   No results found for: "IRON", "TIBC", "FERRITIN"  Attestation Statements:   Reviewed by clinician on day of  visit: allergies, medications, problem list, medical history, surgical history, family history, social history, and previous encounter notes.  I, Brendell Tyus, RMA, am acting as transcriptionist for Everardo Pacific, FNP.  I have reviewed the above documentation for accuracy and completeness, and I agree with the above. -  ***

## 2022-08-04 NOTE — Telephone Encounter (Signed)
Requested Prescriptions  Pending Prescriptions Disp Refills  . meloxicam (MOBIC) 7.5 MG tablet [Pharmacy Med Name: MELOXICAM TABS 7.5MG] 90 tablet 0    Sig: TAKE 1 TABLET DAILY     Analgesics:  COX2 Inhibitors Failed - 08/04/2022  1:00 AM      Failed - Manual Review: Labs are only required if the patient has taken medication for more than 8 weeks.      Passed - HGB in normal range and within 360 days    Hemoglobin  Date Value Ref Range Status  06/03/2022 14.0 11.1 - 15.9 g/dL Final         Passed - Cr in normal range and within 360 days    Creatinine  Date Value Ref Range Status  10/24/2014 0.76 0.60 - 1.30 mg/dL Final   Creatinine, Ser  Date Value Ref Range Status  06/03/2022 0.62 0.57 - 1.00 mg/dL Final         Passed - HCT in normal range and within 360 days    Hematocrit  Date Value Ref Range Status  06/03/2022 41.3 34.0 - 46.6 % Final         Passed - AST in normal range and within 360 days    AST  Date Value Ref Range Status  06/03/2022 20 0 - 40 IU/L Final         Passed - ALT in normal range and within 360 days    ALT  Date Value Ref Range Status  06/03/2022 16 0 - 32 IU/L Final         Passed - eGFR is 30 or above and within 360 days    EGFR (African American)  Date Value Ref Range Status  10/24/2014 >60 >59m/min Final   GFR calc Af Amer  Date Value Ref Range Status  02/28/2020 93 >59 mL/min/1.73 Final    Comment:    **[facility] currently reports eGFR in compliance with the current**   recommendations of the [employer] [facility] will   update reporting as new guidelines are published from the NKF-ASN   Task force.    EGFR (Non-African Amer.)  Date Value Ref Range Status  10/24/2014 >60 >641mmin Final    Comment:    eGFR values <602min/1.73 m2 may be an indication of chronic kidney disease (CKD). Calculated eGFR, using the MRDR Study equation, is useful in  patients with stable renal function. The eGFR calculation will not be  reliable in acutely ill patients when serum creatinine is changing rapidly. It is not useful in patients on dialysis. The eGFR calculation may not be applicable to patients at the low and high extremes of body sizes, pregnant women, and vegetarians.    GFR calc non Af Amer  Date Value Ref Range Status  02/28/2020 80 >59 mL/min/1.73 Final   eGFR  Date Value Ref Range Status  06/03/2022 101 >59 mL/min/1.73 Final         Passed - Patient is not pregnant      Passed - Valid encounter within last 12 months    Recent Outpatient Visits          4 months ago Routine general medical examination at a health care facility   [facility]   5 months ago Pain with urination   [facility] Mecum, Erin E, PA-C   7 months ago Chronic low back pain with sciatica, sciatica laterality unspecified, unspecified back pain laterality   [facility]  [facility], [facility]   8 months ago Fever, unspecified fever cause   [place], [place], DO   9 months ago Other specified hypothyroidism   [facility] Byers, [place], DO      Future Appointments            In 1 month Johnson, Barb Merino, DO [employer], [facility]

## 2022-08-13 ENCOUNTER — Encounter: Payer: Self-pay | Admitting: Physician Assistant

## 2022-08-13 ENCOUNTER — Ambulatory Visit (INDEPENDENT_AMBULATORY_CARE_PROVIDER_SITE_OTHER): Payer: Medicare Other | Admitting: Physician Assistant

## 2022-08-13 VITALS — BP 108/77 | HR 73 | Temp 98.7°F | Ht 65.0 in | Wt 194.1 lb

## 2022-08-13 DIAGNOSIS — R350 Frequency of micturition: Secondary | ICD-10-CM

## 2022-08-13 DIAGNOSIS — R319 Hematuria, unspecified: Secondary | ICD-10-CM | POA: Diagnosis not present

## 2022-08-13 DIAGNOSIS — B379 Candidiasis, unspecified: Secondary | ICD-10-CM | POA: Diagnosis not present

## 2022-08-13 DIAGNOSIS — T3695XA Adverse effect of unspecified systemic antibiotic, initial encounter: Secondary | ICD-10-CM

## 2022-08-13 DIAGNOSIS — N390 Urinary tract infection, site not specified: Secondary | ICD-10-CM | POA: Diagnosis not present

## 2022-08-13 LAB — URINALYSIS, ROUTINE W REFLEX MICROSCOPIC
Bilirubin, UA: NEGATIVE
Glucose, UA: NEGATIVE
Ketones, UA: NEGATIVE
Nitrite, UA: NEGATIVE
Specific Gravity, UA: 1.025 (ref 1.005–1.030)
Urobilinogen, Ur: 0.2 mg/dL (ref 0.2–1.0)
pH, UA: 6 (ref 5.0–7.5)

## 2022-08-13 LAB — MICROSCOPIC EXAMINATION

## 2022-08-13 MED ORDER — FLUCONAZOLE 150 MG PO TABS: 150.0000 mg | ORAL_TABLET | Freq: Once | ORAL | 0 refills | Status: AC

## 2022-08-13 MED ORDER — NITROFURANTOIN MONOHYD MACRO 100 MG PO CAPS: 100.0000 mg | ORAL_CAPSULE | Freq: Two times a day (BID) | ORAL | 0 refills | Status: AC

## 2022-08-13 NOTE — Patient Instructions (Signed)
Based on your symptoms and results of the urinalysis I believe you have a UTI I recommend the following:   I have sent in a script for Macrobid to be taken by mouth twice per day for 5 days  I have sent in a script for Diflucan to help with any yeast infection you may develop while taking the antibiotic. Please use this only if you start having yeast infection symptoms Please finish the entire course of the antibiotic even if you are feeling better before it is completed.  Stay well hydrated (at least 75 oz of water per day) and avoid holding your urine If you have any of the following please let us know: persistent symptoms, fever, trouble urinating or inability to urinate, confusion, flank pain.

## 2022-08-13 NOTE — Progress Notes (Signed)
Acute Office Visit   Patient: Emily [name]   DOB: 05/09/1961   61 y.o. Female  MRN: [id number] Visit Date: 08/13/2022  Today's healthcare provider: Oswaldo Conroy Ronica Vivian, PA-C  Introduced myself to the patient as a Secondary school teacher and provided education on APPs in clinical practice.    Chief Complaint  Patient presents with   Urinary Frequency   Subjective    Urinary Frequency  Associated symptoms include flank pain, frequency and hematuria. Pertinent negatives include no chills.      Onset: sudden  Duration: 2 days ago She reports she has had these in the past- unsure of when the last one was  Associated symptoms: feels pressure and sensation of incomplete voiding Denies dysuria  Interventions: She has taken tylenol  No known abx allergies per review Denies vaginal pain or changes to discharge    Medications: Outpatient Medications Prior to Visit  Medication Sig   clotrimazole-betamethasone (LOTRISONE) cream Apply 1 application. topically daily.   cyclobenzaprine (FLEXERIL) 10 MG tablet Take 1 tablet (10 mg total) by mouth 3 (three) times daily as needed.   Dulaglutide (TRULICITY) 1.5 MG/0.5ML SOPN Inject 1.5 mg into the skin once a week.   hydrochlorothiazide (HYDRODIURIL) 25 MG tablet Take 1 tablet (25 mg total) by mouth daily.   levothyroxine (SYNTHROID) 137 MCG tablet Take 1 tablet (137 mcg total) by mouth daily before breakfast.   losartan (COZAAR) 100 MG tablet TAKE 1 TABLET DAILY   meloxicam (MOBIC) 7.5 MG tablet TAKE 1 TABLET DAILY   triamcinolone ointment (KENALOG) 0.5 % Apply 1 application topically 2 (two) times daily.   venlafaxine XR (EFFEXOR-XR) 150 MG 24 hr capsule TAKE 1 CAPSULE DAILY WITH BREAKFAST (COURTESY REFILL, NEED OFFICE VISIT)   Vitamin D, Ergocalciferol, (DRISDOL) 1.25 MG (50000 UNIT) CAPS capsule Take one pill po every 10 days   No facility-administered medications prior to visit.    Review of Systems  Constitutional:  Negative for chills and  fever.  Genitourinary:  Positive for flank pain, frequency and hematuria. Negative for difficulty urinating and dysuria.       Objective    BP 108/77   Pulse 73   Temp 98.7 F (37.1 C) (Oral)   Ht 5\' 5"  (1.651 m)   Wt 194 lb 1.6 oz (88 kg)   SpO2 97%   BMI 32.30 kg/m    Physical Exam Vitals reviewed.  Constitutional:      General: She is awake.     Appearance: Normal appearance. She is well-developed and well-groomed.  HENT:     Head: Normocephalic and atraumatic.  Cardiovascular:     Rate and Rhythm: Regular rhythm.     Heart sounds: No murmur heard.    No friction rub. No gallop.  Pulmonary:     Effort: Pulmonary effort is normal.  Abdominal:     General: Abdomen is flat. Bowel sounds are normal.     Palpations: Abdomen is soft.     Tenderness: There is no right CVA tenderness or left CVA tenderness.  Neurological:     General: No focal deficit present.     Mental Status: She is alert and oriented to person, place, and time.     GCS: GCS eye subscore is 4. GCS verbal subscore is 5. GCS motor subscore is 6.  Psychiatric:        Attention and Perception: Attention and perception normal.        Mood and Affect: Mood  and affect normal.        Speech: Speech normal.        Behavior: Behavior normal. Behavior is cooperative.       No results found for any visits on 08/13/22.  Assessment & Plan     Problem List Items Addressed This Visit   None Visit Diagnoses     Urinary tract infection with hematuria, site unspecified    -  Primary Acute, new problem Patient reports symptoms comprised of the following:  pressure, incomplete voiding, increased urinary frequency for the past 3 days  Results of UA are consistent with UTI - urine sample sent for culture to determine causative organism and susceptibility- results to dictate further management  Recommend starting Macrobid 100 mg PO BID x 5 days   Will provide script - discussed importance of finishing entire course  of abx and staying well hydrated while recovering from UTI Reviewed ED precautions with patient Follow up as needed for persistent or worsening symptoms   Relevant Medications   nitrofurantoin, macrocrystal-monohydrate, (MACROBID) 100 MG capsule   fluconazole (DIFLUCAN) 150 MG tablet   Frequency of urination       Relevant Orders   Urine Culture   Urinalysis, Routine w reflex microscopic (Completed)   Antibiotic-induced yeast infection     Patient reports she sometimes develops vulvovaginal yeast infection from abx use Will send Diflucan to assist with symptoms if they occur while completing Macrobid course.    Relevant Medications   nitrofurantoin, macrocrystal-monohydrate, (MACROBID) 100 MG capsule   fluconazole (DIFLUCAN) 150 MG tablet        No follow-ups on file.   I, Tyhir Schwan E Barbee Mamula, PA-C, have reviewed all documentation for this visit. The documentation on 08/13/22 for the exam, diagnosis, procedures, and orders are all accurate and complete.   Talitha Givens, MHS, PA-C [facility]

## 2022-08-17 LAB — URINE CULTURE

## 2022-08-18 ENCOUNTER — Ambulatory Visit: Payer: Self-pay | Admitting: *Deleted

## 2022-08-18 MED ORDER — SULFAMETHOXAZOLE-TRIMETHOPRIM 800-160 MG PO TABS: 1.0000 | ORAL_TABLET | Freq: Two times a day (BID) | ORAL | 0 refills | Status: AC

## 2022-08-18 NOTE — Addendum Note (Signed)
Addended by: Talitha Givens on: 08/18/2022 02:12 PM   Modules accepted: Orders

## 2022-08-18 NOTE — Telephone Encounter (Signed)
Pt returned call and was given the message regarding her urine culture and changing the antibiotic by Talitha Givens, PA-C dated 08/18/2022 2:11 PM.  She verbalized understanding to stop the Macrobid and start the Bactrim that's at her pharmacy.   Reason for Disposition  [1] Follow-up call to recent contact AND [2] information only call, no triage required  Answer Assessment - Initial Assessment Questions 1. REASON FOR CALL or QUESTION: "What is your reason for calling today?" or "How can I best help you?" or "What question do you have that I can help answer?"     Pt returned call and was given the urine result message from [facility], PA-C.  Protocols used: Information Only Call - No Triage-A-AH

## 2022-09-02 ENCOUNTER — Ambulatory Visit: Payer: Medicare Other | Admitting: Nurse Practitioner

## 2022-09-08 ENCOUNTER — Ambulatory Visit (INDEPENDENT_AMBULATORY_CARE_PROVIDER_SITE_OTHER): Payer: Medicare Other | Admitting: Nurse Practitioner

## 2022-09-08 ENCOUNTER — Encounter: Payer: Self-pay | Admitting: Nurse Practitioner

## 2022-09-08 VITALS — BP 114/70 | HR 68 | Temp 98.2°F | Ht 65.0 in | Wt 191.0 lb

## 2022-09-08 DIAGNOSIS — E559 Vitamin D deficiency, unspecified: Secondary | ICD-10-CM | POA: Diagnosis not present

## 2022-09-08 DIAGNOSIS — E669 Obesity, unspecified: Secondary | ICD-10-CM

## 2022-09-08 DIAGNOSIS — R632 Polyphagia: Secondary | ICD-10-CM

## 2022-09-08 DIAGNOSIS — Z6831 Body mass index (BMI) 31.0-31.9, adult: Secondary | ICD-10-CM

## 2022-09-08 MED ORDER — TRULICITY 1.5 MG/0.5ML ~~LOC~~ SOAJ: 1.5000 mg | SUBCUTANEOUS | 0 refills | Status: DC

## 2022-09-08 MED ORDER — VITAMIN D (ERGOCALCIFEROL) 1.25 MG (50000 UNIT) PO CAPS: ORAL_CAPSULE | ORAL | 0 refills | Status: DC

## 2022-09-09 ENCOUNTER — Other Ambulatory Visit: Payer: Self-pay | Admitting: Family Medicine

## 2022-09-09 DIAGNOSIS — I10 Essential (primary) hypertension: Secondary | ICD-10-CM

## 2022-09-09 NOTE — Telephone Encounter (Signed)
Requested Prescriptions  Pending Prescriptions Disp Refills   venlafaxine XR (EFFEXOR-XR) 150 MG 24 hr capsule [Pharmacy Med Name: VENLAFAXINE HCL ER CAPS 150MG ] 90 capsule 0    Sig: TAKE 1 CAPSULE DAILY WITH BREAKFAST (COURTESY REFILL, NEED OFFICE VISIT)     Psychiatry: Antidepressants - SNRI - desvenlafaxine & venlafaxine Failed - 09/09/2022 12:41 AM      Failed - Lipid Panel in normal range within the last 12 months    Cholesterol, Total  Date Value Ref Range Status  03/13/2022 200 (H) 100 - 199 mg/dL Final   LDL Chol Calc (NIH)  Date Value Ref Range Status  03/13/2022 129 (H) 0 - 99 mg/dL Final   HDL  Date Value Ref Range Status  03/13/2022 44 >39 mg/dL Final   Triglycerides  Date Value Ref Range Status  03/13/2022 149 0 - 149 mg/dL Final         Passed - Cr in normal range and within 360 days    Creatinine  Date Value Ref Range Status  10/24/2014 0.76 0.60 - 1.30 mg/dL Final   Creatinine, Ser  Date Value Ref Range Status  06/03/2022 0.62 0.57 - 1.00 mg/dL Final         Passed - Completed PHQ-2 or PHQ-9 in the last 360 days      Passed - Last BP in normal range    BP Readings from Last 1 Encounters:  09/08/22 114/70         Passed - Valid encounter within last 6 months    Recent Outpatient Visits           3 weeks ago Urinary tract infection with hematuria, site unspecified   [facility] Mecum, Erin E, PA-C   6 months ago Routine general medical examination at a health care facility   [facility], Megan P, DO   7 months ago Pain with urination   [facility] Mecum, Erin E, PA-C   9 months ago Chronic low back pain with sciatica, sciatica laterality unspecified, unspecified back pain laterality   [facility], Megan P, DO   10 months ago Fever, unspecified fever cause   [facility] [place], [place], DO       Future Appointments             In 1 week Johnson, Megan P, DO  [facility]             hydrochlorothiazide (HYDRODIURIL) 25 MG tablet [Pharmacy Med Name: HYDROCHLOROTHIAZIDE TABS 25MG ] 90 tablet 0    Sig: TAKE 1 TABLET DAILY     Cardiovascular: Diuretics - Thiazide Passed - 09/09/2022 12:41 AM      Passed - Cr in normal range and within 180 days    Creatinine  Date Value Ref Range Status  10/24/2014 0.76 0.60 - 1.30 mg/dL Final   Creatinine, Ser  Date Value Ref Range Status  06/03/2022 0.62 0.57 - 1.00 mg/dL Final         Passed - K in normal range and within 180 days    Potassium  Date Value Ref Range Status  06/03/2022 4.1 3.5 - 5.2 mmol/L Final  10/24/2014 3.9 3.5 - 5.1 mmol/L Final         Passed - Na in normal range and within 180 days    Sodium  Date Value Ref Range Status  06/03/2022 139 134 - 144 mmol/L Final  10/24/2014 139 136 - 145 mmol/L  Final         Passed - Last BP in normal range    BP Readings from Last 1 Encounters:  09/08/22 114/70         Passed - Valid encounter within last 6 months    Recent Outpatient Visits           3 weeks ago Urinary tract infection with hematuria, site unspecified   [facility] Mecum, Erin E, PA-C   6 months ago Routine general medical examination at a health care facility   [facility] [place] P, DO   7 months ago Pain with urination   [facility] Mecum, Erin E, PA-C   9 months ago Chronic low back pain with sciatica, sciatica laterality unspecified, unspecified back pain laterality   [place], Megan P, DO   10 months ago Fever, unspecified fever cause   [facility] [place], Barb Merino, DO       Future Appointments             In 1 week Wynetta Emery, Barb Merino, DO [facility], [facility]

## 2022-09-15 ENCOUNTER — Ambulatory Visit: Payer: BC Managed Care – PPO | Admitting: Family Medicine

## 2022-09-16 ENCOUNTER — Ambulatory Visit: Payer: BC Managed Care – PPO | Admitting: Family Medicine

## 2022-09-18 NOTE — Progress Notes (Signed)
Chief Complaint:   OBESITY Emily [name] is here to discuss her progress with her obesity treatment plan along with follow-up of her obesity related diagnoses. Emily [name] is on the Category 2 Plan and states she is following her eating plan approximately 50% of the time. Emily [name] states she is exercising 0 minutes 0 times per week.  Today's visit was #: 13 Starting weight: 218 lbs Starting date: 10/24/2021 Today's weight: 191 lbs Today's date: 09/08/2022 Total lbs lost to date: 27 lbs Total lbs lost since last in-office visit: 0  Interim History: Emily [name] is currently struggling with a UTI. She is seeing PCP for a follow up tomorrow. Has gotten off track since her last visit. She is struggling with hunger and cravings.  Subjective:   1. Vitamin D insufficiency Emily [name] is currently taking prescription Vit D 50,000 IU once every 10 days. Denies any nausea, vomiting or muscle weakness.  2. Polyphagia Emily [name] is currently taking Trulicity 1.5 mg. Denies any side effects.Struggling with hunger and cravings.   Assessment/Plan:   1. Vitamin D insufficiency We will refill Vit D 50,000 IU once weekly for 1 month with 0 refills. Low Vitamin D level contributes to fatigue and are associated with obesity, breast, and colon cancer. She agrees to continue to take prescription Vitamin D @50 ,000 IU every week and will follow-up for routine testing of Vitamin D, at least 2-3 times per year to avoid over-replacement.   -Refill Vitamin D, Ergocalciferol, (DRISDOL) 1.25 MG (50000 UNIT) CAPS capsule; Take one pill po every 10 days  Dispense: 4 capsule; Refill: 0  2. Polyphagia We will refill Trulicity 1.5 mg SQ weekly for 1 month with 0 refills. Consider increasing dose at next visit.  -Refill Dulaglutide (TRULICITY) 1.5 MG/0.5ML SOPN; Inject 1.5 mg into the skin once a week.  Dispense: 2 mL; Refill: 0  3. Current BMI 31.9 Emily [name] is currently in the action stage of change. As such, her goal is to continue with weight loss  efforts. She has agreed to the Category 2 Plan.   Will check fasting labs and IC at next visit. Consider Zepbound based upon coverage.  Exercise goals: All adults should avoid inactivity. Some physical activity is better than none, and adults who participate in any amount of physical activity gain some health benefits.  Behavioral modification strategies: increasing lean protein intake, increasing water intake, and holiday eating strategies .  Emily [name] has agreed to follow-up with our clinic in 4 weeks. She was informed of the importance of frequent follow-up visits to maximize her success with intensive lifestyle modifications for her multiple health conditions.   Objective:   Blood pressure 114/70, pulse 68, temperature 98.2 F (36.8 C), temperature source Oral, height 5\' 5"  (1.651 m), weight 191 lb (86.6 kg), SpO2 96 %. Body mass index is 31.78 kg/m.  General: Cooperative, alert, well developed, in no acute distress. HEENT: Conjunctivae and lids unremarkable. Cardiovascular: Regular rhythm.  Lungs: Normal work of breathing. Neurologic: No focal deficits.   Lab Results  Component Value Date   CREATININE 0.62 06/03/2022   BUN 17 06/03/2022   NA 139 06/03/2022   K 4.1 06/03/2022   CL 99 06/03/2022   CO2 26 06/03/2022   Lab Results  Component Value Date   ALT 16 06/03/2022   AST 20 06/03/2022   ALKPHOS 81 06/03/2022   BILITOT 0.4 06/03/2022   Lab Results  Component Value Date   HGBA1C 5.2 03/13/2022   HGBA1C 5.9 (H) 10/15/2021   HGBA1C 5.5  01/21/2021   HGBA1C 5.6 02/28/2020   HGBA1C 5.6 06/07/2019   Lab Results  Component Value Date   INSULIN 21.9 10/24/2021   INSULIN 33.8 (H) 11/17/2018   Lab Results  Component Value Date   TSH 0.593 06/03/2022   Lab Results  Component Value Date   CHOL 200 (H) 03/13/2022   HDL 44 03/13/2022   LDLCALC 129 (H) 03/13/2022   TRIG 149 03/13/2022   Lab Results  Component Value Date   VD25OH 61.1 06/03/2022   VD25OH 30.1  03/13/2022   VD25OH 25.7 (L) 10/24/2021   Lab Results  Component Value Date   WBC 7.7 06/03/2022   HGB 14.0 06/03/2022   HCT 41.3 06/03/2022   MCV 93 06/03/2022   PLT 245 06/03/2022   No results found for: "IRON", "TIBC", "FERRITIN"  Attestation Statements:   Reviewed by clinician on day of visit: allergies, medications, problem list, medical history, surgical history, family history, social history, and previous encounter notes.  I, Brendell Tyus, RMA, am acting as transcriptionist for Irene Limbo, FNP.  I have reviewed the above documentation for accuracy and completeness, and I agree with the above. Irene Limbo, FNP

## 2022-10-06 ENCOUNTER — Ambulatory Visit (INDEPENDENT_AMBULATORY_CARE_PROVIDER_SITE_OTHER): Payer: Medicare Other | Admitting: Nurse Practitioner

## 2022-10-06 ENCOUNTER — Encounter: Payer: Self-pay | Admitting: Nurse Practitioner

## 2022-10-06 ENCOUNTER — Telehealth: Payer: Self-pay

## 2022-10-06 VITALS — BP 146/81 | HR 70 | Temp 98.2°F | Ht 65.0 in | Wt 197.0 lb

## 2022-10-06 DIAGNOSIS — E559 Vitamin D deficiency, unspecified: Secondary | ICD-10-CM

## 2022-10-06 DIAGNOSIS — R7303 Prediabetes: Secondary | ICD-10-CM

## 2022-10-06 DIAGNOSIS — E782 Mixed hyperlipidemia: Secondary | ICD-10-CM | POA: Diagnosis not present

## 2022-10-06 DIAGNOSIS — Z6832 Body mass index (BMI) 32.0-32.9, adult: Secondary | ICD-10-CM

## 2022-10-06 DIAGNOSIS — R632 Polyphagia: Secondary | ICD-10-CM

## 2022-10-06 DIAGNOSIS — E88819 Insulin resistance, unspecified: Secondary | ICD-10-CM

## 2022-10-06 DIAGNOSIS — Z6837 Body mass index (BMI) 37.0-37.9, adult: Secondary | ICD-10-CM | POA: Diagnosis not present

## 2022-10-06 DIAGNOSIS — R0602 Shortness of breath: Secondary | ICD-10-CM

## 2022-10-06 DIAGNOSIS — E669 Obesity, unspecified: Secondary | ICD-10-CM

## 2022-10-06 MED ORDER — OZEMPIC (0.25 OR 0.5 MG/DOSE) 2 MG/3ML ~~LOC~~ SOPN: 0.5000 mg | PEN_INJECTOR | SUBCUTANEOUS | 0 refills | Status: DC

## 2022-10-06 MED ORDER — VITAMIN D (ERGOCALCIFEROL) 1.25 MG (50000 UNIT) PO CAPS: ORAL_CAPSULE | ORAL | 0 refills | Status: DC

## 2022-10-06 NOTE — Telephone Encounter (Signed)
Prior Auth started for Ozempic. Pt will be notified of response once determination has been received.

## 2022-10-07 LAB — LIPID PANEL WITH LDL/HDL RATIO
Cholesterol, Total: 214 mg/dL — ABNORMAL HIGH (ref 100–199)
HDL: 47 mg/dL (ref >39)
LDL Chol Calc (NIH): 134 mg/dL — ABNORMAL HIGH (ref 0–99)
LDL/HDL Ratio: 2.9 ratio (ref 0.0–3.2)
Triglycerides: 184 mg/dL — ABNORMAL HIGH (ref 0–149)
VLDL Cholesterol Cal: 33 mg/dL (ref 5–40)

## 2022-10-07 LAB — HEMOGLOBIN A1C
Est. average glucose Bld gHb Est-mCnc: 108 mg/dL
Hgb A1c MFr Bld: 5.4 % (ref 4.8–5.6)

## 2022-10-07 LAB — COMPREHENSIVE METABOLIC PANEL
ALT: 25 IU/L (ref 0–32)
AST: 21 IU/L (ref 0–40)
Albumin/Globulin Ratio: 1.7 (ref 1.2–2.2)
Albumin: 4.5 g/dL (ref 3.9–4.9)
Alkaline Phosphatase: 87 IU/L (ref 44–121)
BUN/Creatinine Ratio: 30 — ABNORMAL HIGH (ref 12–28)
BUN: 20 mg/dL (ref 8–27)
Bilirubin Total: 0.3 mg/dL (ref 0.0–1.2)
CO2: 21 mmol/L (ref 20–29)
Calcium: 9.7 mg/dL (ref 8.7–10.3)
Chloride: 105 mmol/L (ref 96–106)
Creatinine, Ser: 0.67 mg/dL (ref 0.57–1.00)
Globulin, Total: 2.7 g/dL (ref 1.5–4.5)
Glucose: 86 mg/dL (ref 70–99)
Potassium: 4.4 mmol/L (ref 3.5–5.2)
Sodium: 142 mmol/L (ref 134–144)
Total Protein: 7.2 g/dL (ref 6.0–8.5)
eGFR: 98 mL/min/{1.73_m2} (ref >59)

## 2022-10-07 LAB — INSULIN, RANDOM: INSULIN: 16.2 u[IU]/mL (ref 2.6–24.9)

## 2022-10-07 LAB — VITAMIN D 25 HYDROXY (VIT D DEFICIENCY, FRACTURES): Vit D, 25-Hydroxy: 36.9 ng/mL (ref 30.0–100.0)

## 2022-10-09 NOTE — Telephone Encounter (Signed)
Prior Auth for Ozempic has been denied. Pt does not have a diagnosis of Type 2 Diabetes.

## 2022-10-15 NOTE — Progress Notes (Unsigned)
Chief Complaint:   OBESITY Emily [name] is here to discuss her progress with her obesity treatment plan along with follow-up of her obesity related diagnoses. Emily [name] is on the Category 2 Plan and states she is following her eating plan approximately 0% of the time. Emily [name] states she is exercising 0 minutes 0 times per week.  Today's visit was #: 14 Starting weight: 218 lbs Starting date: 10/24/2021 Today's weight: 197 lbs Today's date: 10/06/2022 Total lbs lost to date: 21 lbs Total lbs lost since last in-office visit: 0  Interim History: Emily [name] is under stress with holidays and her son is being evaluated for skin cancer. Notes some stress eating. She reports that she has given up this month. Plans to get back on track.  Subjective:   1. Vitamin D insufficiency Emily [name] is currently taking prescription Vit D 50,000 IU once a week. Denies any nausea, vomiting or muscle weakness.  2. Insulin resistance Emily [name] has never been on Metformin. Family history: maternal grandmother, paternal grandmother (DMT2). Reports polyphagia or cravings.   3. Mixed hyperlipidemia Emily [name] has never been on medications. Family history: mother, father, maternal grandmother, paternal grandmother.  4. SOB (shortness of breath) on exertion Emily [name]'s last IC was 2030 on 10/24/21. Today's IC was 1728.  5. Polyphagia Takes Trulicity 1.5 mg. Denies any side effects. Struggles with polyphagia or cravings. Does not feel like it is working.  Assessment/Plan:   1. Vitamin D insufficiency We will obtain labs today. We will refill Vit D 50K IU once a week for 1 month with 0 refills.  -Refill Vitamin D, Ergocalciferol, (DRISDOL) 1.25 MG (50000 UNIT) CAPS capsule; Take one pill po every 10 days  Dispense: 4 capsule; Refill: 0  - VITAMIN D 25 Hydroxy (Vit-D Deficiency, Fractures) - Comprehensive metabolic panel  2. Insulin resistance We will obtain labs today.  - Comprehensive metabolic panel - Insulin, random - Hemoglobin  A1c  3. Mixed hyperlipidemia We will obtain labs today.  - Comprehensive metabolic panel - Lipid Panel With LDL/HDL Ratio  4. SOB (shortness of breath) on exertion Worsening. Increase water, protein and movement.  5. Polyphagia Stop Trulicity and Start Ozempic 0.5 mg SQ once a week for 1 month with 0 refills. Side effects discussed.   -Start Semaglutide,0.25 or 0.5MG /DOS, (OZEMPIC, 0.25 OR 0.5 MG/DOSE,) 2 MG/3ML SOPN; Inject 0.5 mg into the skin once a week.  Dispense: 3 mL; Refill: 0  6. Current BMI 32.8 Emily [name] is currently in the action stage of change. As such, her goal is to continue with weight loss efforts. She has agreed to the Category 2 Plan.   Exercise goals: All adults should avoid inactivity. Some physical activity is better than none, and adults who participate in any amount of physical activity gain some health benefits.  Behavioral modification strategies: increasing lean protein intake, increasing water intake, and holiday eating strategies .  Emily [name] has agreed to follow-up with our clinic in 4 weeks. She was informed of the importance of frequent follow-up visits to maximize her success with intensive lifestyle modifications for her multiple health conditions.   Emily [name] was informed we would discuss her lab results at her next visit unless there is a critical issue that needs to be addressed sooner. Emily [name] agreed to keep her next visit at the agreed upon time to discuss these results.  Objective:   Blood pressure (!) 146/81, pulse 70, temperature 98.2 F (36.8 C), temperature source Oral, height 5\' 5"  (1.651 m), weight 197 lb (89.4 kg), SpO2  97 %. Body mass index is 32.78 kg/m.  General: Cooperative, alert, well developed, in no acute distress. HEENT: Conjunctivae and lids unremarkable. Cardiovascular: Regular rhythm.  Lungs: Normal work of breathing. Neurologic: No focal deficits.   Lab Results  Component Value Date   CREATININE 0.67 10/06/2022   BUN 20 10/06/2022    NA 142 10/06/2022   K 4.4 10/06/2022   CL 105 10/06/2022   CO2 21 10/06/2022   Lab Results  Component Value Date   ALT 25 10/06/2022   AST 21 10/06/2022   ALKPHOS 87 10/06/2022   BILITOT 0.3 10/06/2022   Lab Results  Component Value Date   HGBA1C 5.4 10/06/2022   HGBA1C 5.2 03/13/2022   HGBA1C 5.9 (H) 10/15/2021   HGBA1C 5.5 01/21/2021   HGBA1C 5.6 02/28/2020   Lab Results  Component Value Date   INSULIN 16.2 10/06/2022   INSULIN 21.9 10/24/2021   INSULIN 33.8 (H) 11/17/2018   Lab Results  Component Value Date   TSH 0.593 06/03/2022   Lab Results  Component Value Date   CHOL 214 (H) 10/06/2022   HDL 47 10/06/2022   LDLCALC 134 (H) 10/06/2022   TRIG 184 (H) 10/06/2022   Lab Results  Component Value Date   VD25OH 36.9 10/06/2022   VD25OH 61.1 06/03/2022   VD25OH 30.1 03/13/2022   Lab Results  Component Value Date   WBC 7.7 06/03/2022   HGB 14.0 06/03/2022   HCT 41.3 06/03/2022   MCV 93 06/03/2022   PLT 245 06/03/2022   No results found for: "IRON", "TIBC", "FERRITIN"  Attestation Statements:   Reviewed by clinician on day of visit: allergies, medications, problem list, medical history, surgical history, family history, social history, and previous encounter notes.  I, Brendell Tyus, RMA, am acting as transcriptionist for Everardo Pacific, FNP.  I have reviewed the above documentation for accuracy and completeness, and I agree with the above. -  ***

## 2022-10-26 NOTE — Progress Notes (Unsigned)
There were no vitals taken for this visit.   Subjective:    Patient ID: Emily [name], female    DOB: 29-Oct-1960, 62 y.o.   MRN: [id number]  HPI: Emily [name] is a 62 y.o. female  No chief complaint on file.  HYPERTENSION / HYPERLIPIDEMIA Satisfied with current treatment? {Blank single:19197::"yes","no"} Duration of hypertension: {Blank single:19197::"chronic","months","years"} BP monitoring frequency: {Blank single:19197::"not checking","rarely","daily","weekly","monthly","a few times a day","a few times a week","a few times a month"} BP range:  BP medication side effects: {Blank single:19197::"yes","no"} Past BP meds: {Blank NLZJQBHA:19379::"KWIO","XBDZHGDJME","QASTMHDQQI/WLNLGXQJJH","ERDEYCXK","GYJEHUDJSH","FWYOVZCHYI/FOYD","XAJOINOMVE (bystolic)","carvedilol","chlorthalidone","clonidine","diltiazem","exforge HCT","HCTZ","irbesartan (avapro)","labetalol","lisinopril","lisinopril-HCTZ","losartan (cozaar)","methyldopa","nifedipine","olmesartan (benicar)","olmesartan-HCTZ","quinapril","ramipril","spironalactone","tekturna","valsartan","valsartan-HCTZ","verapamil"} Duration of hyperlipidemia: {Blank single:19197::"chronic","months","years"} Cholesterol medication side effects: {Blank single:19197::"yes","no"} Cholesterol supplements: {Blank multiple:19196::"none","fish oil","niacin","red yeast rice"} Past cholesterol medications: {Blank multiple:19196::"none","atorvastain (lipitor)","lovastatin (mevacor)","pravastatin (pravachol)","rosuvastatin (crestor)","simvastatin (zocor)","vytorin","fenofibrate (tricor)","gemfibrozil","ezetimide (zetia)","niaspan","lovaza"} Medication compliance: {Blank single:19197::"excellent compliance","good compliance","fair compliance","poor compliance"} Aspirin: {Blank single:19197::"yes","no"} Recent stressors: {Blank single:19197::"yes","no"} Recurrent headaches: {Blank single:19197::"yes","no"} Visual changes: {Blank single:19197::"yes","no"} Palpitations:  {Blank single:19197::"yes","no"} Dyspnea: {Blank single:19197::"yes","no"} Chest pain: {Blank single:19197::"yes","no"} Lower extremity edema: {Blank single:19197::"yes","no"} Dizzy/lightheaded: {Blank single:19197::"yes","no"}  HYPOTHYROIDISM Thyroid control status:{Blank single:19197::"controlled","uncontrolled","better","worse","exacerbated","stable"} Satisfied with current treatment? {Blank single:19197::"yes","no"} Medication side effects: {Blank single:19197::"yes","no"} Medication compliance: {Blank single:19197::"excellent compliance","good compliance","fair compliance","poor compliance"} Etiology of hypothyroidism:  Recent dose adjustment:{Blank single:19197::"yes","no"} Fatigue: {Blank single:19197::"yes","no"} Cold intolerance: {Blank single:19197::"yes","no"} Heat intolerance: {Blank single:19197::"yes","no"} Weight gain: {Blank single:19197::"yes","no"} Weight loss: {Blank single:19197::"yes","no"} Constipation: {Blank single:19197::"yes","no"} Diarrhea/loose stools: {Blank single:19197::"yes","no"} Palpitations: {Blank single:19197::"yes","no"} Lower extremity edema: {Blank single:19197::"yes","no"} Anxiety/depressed mood: {Blank single:19197::"yes","no"}  DEPRESSION Mood status: {Blank single:19197::"controlled","uncontrolled","better","worse","exacerbated","stable"} Satisfied with current treatment?: {Blank single:19197::"yes","no"} Symptom severity: {Blank single:19197::"mild","moderate","severe"}  Duration of current treatment : {Blank single:19197::"chronic","months","years"} Side effects: {Blank single:19197::"yes","no"} Medication compliance: {Blank single:19197::"excellent compliance","good compliance","fair compliance","poor compliance"} Psychotherapy/counseling: {Blank single:19197::"yes","no"} {Blank single:19197::"current","in the past"} Previous psychiatric medications: {Blank  multiple:19196::"abilify","amitryptiline","buspar","celexa","cymbalta","depakote","effexor","lamictal","lexapro","lithium","nortryptiline","paxil","prozac","pristiq (desvenlafaxine","seroquel","wellbutrin","zoloft","zyprexa"} Depressed mood: {Blank single:19197::"yes","no"} Anxious mood: {Blank single:19197::"yes","no"} Anhedonia: {Blank single:19197::"yes","no"} Significant weight loss or gain: {Blank single:19197::"yes","no"} Insomnia: {Blank single:19197::"yes","no"} {Blank single:19197::"hard to fall asleep","hard to stay asleep"} Fatigue: {Blank single:19197::"yes","no"} Feelings of worthlessness or guilt: {Blank single:19197::"yes","no"} Impaired concentration/indecisiveness: {Blank single:19197::"yes","no"} Suicidal ideations: {Blank single:19197::"yes","no"} Hopelessness: {Blank single:19197::"yes","no"} Crying spells: {Blank single:19197::"yes","no"}    08/13/2022   11:23 AM 04/21/2022   11:56 AM 03/13/2022   11:15 AM 02/10/2022    2:40 PM 12/10/2021    9:16 AM  Depression screen PHQ 2/9  Decreased Interest 1 1 1  0 0  Down, Depressed, Hopeless 0 1 1 0 0  PHQ - 2 Score 1 2 2  0 0  Altered sleeping 2 0 1 1 2   Tired, decreased energy 1 1 2 1 2   Change in appetite 0 0 0 0 0  Feeling bad or failure about yourself  0 0 0 0 0  Trouble concentrating 1 0 0 0 1  Moving slowly or fidgety/restless 0 0 0 0 0  Suicidal thoughts 0 0 0 0 0  PHQ-9 Score 5 3 5 2 5   Difficult doing work/chores Not difficult at all   Not difficult at all    URINARY SYMPTOMS  Dysuria: {Blank single:19197::"yes","no","burning"} Urinary frequency: {Blank single:19197::"yes","no"} Urgency: {Blank single:19197::"yes","no"} Small volume voids: {Blank single:19197::"yes","no"} Symptom severity: {Blank single:19197::"yes","no"} Urinary incontinence: {Blank single:19197::"yes","no"} Foul odor: {Blank single:19197::"yes","no"} Hematuria: {Blank single:19197::"yes","no"} Abdominal pain: {Blank  single:19197::"yes","no"} Back pain: {Blank single:19197::"yes","no"} Suprapubic pain/pressure: {Blank single:19197::"yes","no"} Flank pain: {Blank single:19197::"yes","no"} Fever:  {Blank multiple:19196::"yes","no","subjective","low grade"} Vomiting: {Blank single:19197::"yes","no"} Relief with cranberry juice: {Blank single:19197::"yes","no"} Relief with pyridium: {Blank single:19197::"yes","no"} Status: better/worse/stable Previous urinary tract infection: {Blank single:19197::"yes","no"} Recurrent urinary tract infection: {Blank single:19197::"yes","no"} Sexual activity: No sexually active/monogomous/practicing safe sex History of sexually transmitted disease: {Blank single:19197::"yes","no"} Penile discharge: {Blank single:19197::"yes","no"}  Treatments attempted: {Blank multiple:19196::"none","antibiotics","pyridium","cranberry","increasing fluids"}   Relevant past medical, surgical, family and social history reviewed and updated as indicated. Interim medical history since our last visit reviewed. Allergies and medications reviewed and updated.  Review of Systems  Per HPI unless specifically indicated above     Objective:    There were no vitals taken for this visit.  Wt Readings from Last 3 Encounters:  10/06/22 197 lb (89.4 kg)  09/08/22 191 lb (86.6 kg)  08/13/22 194 lb 1.6 oz (88 kg)    Physical Exam  Results for orders placed or performed in visit on 10/06/22  VITAMIN D 25 Hydroxy (Vit-D Deficiency, Fractures)  Result Value Ref Range   Vit D, 25-Hydroxy 36.9 30.0 - 100.0 ng/mL  Comprehensive metabolic panel  Result Value Ref Range   Glucose 86 70 - 99 mg/dL   BUN 20 8 - 27 mg/dL   Creatinine, Ser 7.49 0.57 - 1.00 mg/dL   eGFR 98 >35 LE/ZVG/7.15   BUN/Creatinine Ratio 30 (H) 12 - 28   Sodium 142 134 - 144 mmol/L   Potassium 4.4 3.5 - 5.2 mmol/L   Chloride 105 96 - 106 mmol/L   CO2 21 20 - 29 mmol/L   Calcium 9.7 8.7 - 10.3 mg/dL   Total Protein 7.2 6.0 - 8.5  g/dL   Albumin 4.5 3.9 - 4.9 g/dL   Globulin, Total 2.7 1.5 - 4.5 g/dL   Albumin/Globulin Ratio 1.7 1.2 - 2.2   Bilirubin Total 0.3 0.0 - 1.2 mg/dL   Alkaline Phosphatase 87 44 - 121 IU/L   AST 21 0 - 40 IU/L   ALT 25 0 - 32 IU/L  Lipid Panel With LDL/HDL Ratio  Result Value Ref Range   Cholesterol, Total 214 (H) 100 - 199 mg/dL   Triglycerides 953 (H) 0 - 149 mg/dL   HDL 47 >96 mg/dL   VLDL Cholesterol Cal 33 5 - 40 mg/dL   LDL Chol Calc (NIH) 728 (H) 0 - 99 mg/dL   LDL/HDL Ratio 2.9 0.0 - 3.2 ratio  Insulin, random  Result Value Ref Range   INSULIN 16.2 2.6 - 24.9 uIU/mL  Hemoglobin A1c  Result Value Ref Range   Hgb A1c MFr Bld 5.4 4.8 - 5.6 %   Est. average glucose Bld gHb Est-mCnc 108 mg/dL      Assessment & Plan:   Problem List Items Addressed This Visit       Cardiovascular and Mediastinum   Essential hypertension     Endocrine   Hypothyroidism - Primary     Other   Depression, recurrent (HCC)   Vitamin D deficiency   Pre-diabetes   Hyperlipidemia   Other Visit Diagnoses     Dysuria            Follow up plan: No follow-ups on file.

## 2022-10-27 ENCOUNTER — Ambulatory Visit (INDEPENDENT_AMBULATORY_CARE_PROVIDER_SITE_OTHER): Payer: Medicare Other | Admitting: Family Medicine

## 2022-10-27 ENCOUNTER — Encounter: Payer: Self-pay | Admitting: Family Medicine

## 2022-10-27 VITALS — BP 137/82 | HR 69 | Temp 99.1°F | Ht 65.0 in | Wt 207.2 lb

## 2022-10-27 DIAGNOSIS — R7303 Prediabetes: Secondary | ICD-10-CM

## 2022-10-27 DIAGNOSIS — F339 Major depressive disorder, recurrent, unspecified: Secondary | ICD-10-CM | POA: Diagnosis not present

## 2022-10-27 DIAGNOSIS — R300 Dysuria: Secondary | ICD-10-CM

## 2022-10-27 DIAGNOSIS — E782 Mixed hyperlipidemia: Secondary | ICD-10-CM

## 2022-10-27 DIAGNOSIS — R8281 Pyuria: Secondary | ICD-10-CM

## 2022-10-27 DIAGNOSIS — R238 Other skin changes: Secondary | ICD-10-CM | POA: Diagnosis not present

## 2022-10-27 DIAGNOSIS — I10 Essential (primary) hypertension: Secondary | ICD-10-CM | POA: Diagnosis not present

## 2022-10-27 DIAGNOSIS — E559 Vitamin D deficiency, unspecified: Secondary | ICD-10-CM

## 2022-10-27 DIAGNOSIS — E038 Other specified hypothyroidism: Secondary | ICD-10-CM

## 2022-10-27 LAB — URINALYSIS, ROUTINE W REFLEX MICROSCOPIC
Bilirubin, UA: NEGATIVE
Glucose, UA: NEGATIVE
Ketones, UA: NEGATIVE
Nitrite, UA: NEGATIVE
Protein,UA: NEGATIVE
RBC, UA: NEGATIVE
Specific Gravity, UA: 1.02 (ref 1.005–1.030)
Urobilinogen, Ur: 0.2 mg/dL (ref 0.2–1.0)
pH, UA: 5.5 (ref 5.0–7.5)

## 2022-10-27 LAB — MICROSCOPIC EXAMINATION
Bacteria, UA: NONE SEEN
RBC, Urine: NONE SEEN /hpf (ref 0–2)

## 2022-10-27 MED ORDER — TRIAMCINOLONE ACETONIDE 0.5 % EX OINT: 1.0000 | TOPICAL_OINTMENT | Freq: Two times a day (BID) | CUTANEOUS | 0 refills | Status: DC

## 2022-10-27 MED ORDER — MELOXICAM 7.5 MG PO TABS: 7.5000 mg | ORAL_TABLET | Freq: Every day | ORAL | 1 refills | Status: DC

## 2022-10-27 MED ORDER — LEVOTHYROXINE SODIUM 137 MCG PO TABS: 137.0000 ug | ORAL_TABLET | Freq: Every day | ORAL | 1 refills | Status: DC

## 2022-10-27 MED ORDER — LOSARTAN POTASSIUM 100 MG PO TABS: 100.0000 mg | ORAL_TABLET | Freq: Every day | ORAL | 1 refills | Status: DC

## 2022-10-27 MED ORDER — HYDROCHLOROTHIAZIDE 25 MG PO TABS: 25.0000 mg | ORAL_TABLET | Freq: Every day | ORAL | 1 refills | Status: DC

## 2022-10-27 MED ORDER — VENLAFAXINE HCL ER 150 MG PO CP24: 150.0000 mg | ORAL_CAPSULE | Freq: Every day | ORAL | 1 refills | Status: DC

## 2022-10-27 MED ORDER — CIPROFLOXACIN HCL 500 MG PO TABS: 500.0000 mg | ORAL_TABLET | Freq: Two times a day (BID) | ORAL | 0 refills | Status: AC

## 2022-10-27 NOTE — Assessment & Plan Note (Signed)
Under good control on current regimen. Continue current regimen. Continue to monitor. Call with any concerns. Refills given. Labs drawn at weight management last month and were normal.   

## 2022-10-27 NOTE — Assessment & Plan Note (Signed)
Labs are remaining stable. Checked last month at weight management. Declines medication at this time.

## 2022-10-27 NOTE — Assessment & Plan Note (Signed)
Under good control on current regimen. Continue current regimen. Continue to monitor. Call with any concerns. Refills given. Labs drawn at weight management last month and were stable.

## 2022-10-27 NOTE — Assessment & Plan Note (Signed)
Under good control on current regimen. Continue current regimen. Continue to monitor. Call with any concerns. Refills given. Labs drawn at weight management last month and were normal.

## 2022-10-27 NOTE — Assessment & Plan Note (Signed)
Under good control on current regimen. Continue current regimen. Continue to monitor. Call with any concerns. Refills given.   

## 2022-10-28 LAB — CBC WITH DIFFERENTIAL/PLATELET
Basophils Absolute: 0.1 10*3/uL (ref 0.0–0.2)
Basos: 1 %
EOS (ABSOLUTE): 0.3 10*3/uL (ref 0.0–0.4)
Eos: 3 %
Hematocrit: 39.9 % (ref 34.0–46.6)
Hemoglobin: 13.1 g/dL (ref 11.1–15.9)
Immature Grans (Abs): 0 10*3/uL (ref 0.0–0.1)
Immature Granulocytes: 0 %
Lymphocytes Absolute: 4.1 10*3/uL — ABNORMAL HIGH (ref 0.7–3.1)
Lymphs: 49 %
MCH: 30.5 pg (ref 26.6–33.0)
MCHC: 32.8 g/dL (ref 31.5–35.7)
MCV: 93 fL (ref 79–97)
Monocytes Absolute: 0.7 10*3/uL (ref 0.1–0.9)
Monocytes: 8 %
Neutrophils Absolute: 3.3 10*3/uL (ref 1.4–7.0)
Neutrophils: 39 %
Platelets: 270 10*3/uL (ref 150–450)
RBC: 4.29 x10E6/uL (ref 3.77–5.28)
RDW: 12 % (ref 11.7–15.4)
WBC: 8.5 10*3/uL (ref 3.4–10.8)

## 2022-10-29 LAB — URINE CULTURE: Organism ID, Bacteria: NO GROWTH

## 2022-11-03 ENCOUNTER — Ambulatory Visit: Payer: Medicare Other | Admitting: Nurse Practitioner

## 2022-11-18 ENCOUNTER — Encounter: Payer: Self-pay | Admitting: Nurse Practitioner

## 2022-11-18 ENCOUNTER — Ambulatory Visit (INDEPENDENT_AMBULATORY_CARE_PROVIDER_SITE_OTHER): Payer: BC Managed Care – PPO | Admitting: Nurse Practitioner

## 2022-11-18 VITALS — BP 132/66 | HR 69 | Temp 98.2°F | Ht 65.0 in | Wt 206.0 lb

## 2022-11-18 DIAGNOSIS — E559 Vitamin D deficiency, unspecified: Secondary | ICD-10-CM | POA: Diagnosis not present

## 2022-11-18 DIAGNOSIS — R632 Polyphagia: Secondary | ICD-10-CM | POA: Diagnosis not present

## 2022-11-18 DIAGNOSIS — E669 Obesity, unspecified: Secondary | ICD-10-CM | POA: Diagnosis not present

## 2022-11-18 DIAGNOSIS — E782 Mixed hyperlipidemia: Secondary | ICD-10-CM

## 2022-11-18 DIAGNOSIS — Z6834 Body mass index (BMI) 34.0-34.9, adult: Secondary | ICD-10-CM | POA: Diagnosis not present

## 2022-11-18 MED ORDER — VITAMIN D (ERGOCALCIFEROL) 1.25 MG (50000 UNIT) PO CAPS: ORAL_CAPSULE | ORAL | 0 refills | Status: DC

## 2022-11-18 MED ORDER — TRULICITY 3 MG/0.5ML ~~LOC~~ SOAJ: 3.0000 mg | SUBCUTANEOUS | 0 refills | Status: DC

## 2022-11-18 NOTE — Patient Instructions (Signed)
The 10-year ASCVD risk score (Arnett DK, et al., 2019) is: 7.3%   Values used to calculate the score:     Age: 62 years     Sex: Female     Is Non-Hispanic African American: No     Diabetic: No     Tobacco smoker: No     Systolic Blood Pressure: 702 mmHg     Is BP treated: Yes     HDL Cholesterol: 47 mg/dL     Total Cholesterol: 214 mg/dL

## 2022-11-27 NOTE — Progress Notes (Signed)
Chief Complaint:   OBESITY Emily [name] is here to discuss her progress with her obesity treatment plan along with follow-up of her obesity related diagnoses. Emily [name] is on the Category 2 Plan and states she is following her eating plan approximately 30% of the time. Emily [name] states she is exercising 0 minutes 0 times per week.  Today's visit was #: 15 Starting weight: 218 lbs Starting date: 10/24/2021 Today's weight: 206 lbs Today's date: 11/18/2022 Total lbs lost to date: 12 lbs Total lbs lost since last in-office visit: 0  Interim History: Emily [name] was seen here last on 10/06/22.  She has celebrated Christmas and New Years since her last visit.  Has gotten off track.  Subjective:   1. Polyphagia Emily [name] is taking Trulicity 1.5 mg.  Denies any side effects.  Struggles with hunger and cravings.  2. Mixed hyperlipidemia Labs discussed during visit today-worsening.  Never been on medications.  Family history: mother and father.  3. Vitamin D insufficiency Labs discussed during visit today.  Emily [name] is currently taking prescription Vit D 50,000 IU once a week.  Denies any nausea, vomiting or muscle weakness.  Assessment/Plan:   1. Polyphagia Refill/Increase Trulicity to 3 mg SQ once a week for 1 month with 0 refills.  Side effects discussed.   -Increase/Refill Dulaglutide (TRULICITY) 3 DX/8.3JA SOPN; Inject 3 mg as directed once a week.  Dispense: 2 mL; Refill: 0  2. Mixed hyperlipidemia To discuss with PCP.  ASCVD reviewed with patient.  Cardiovascular risk and specific lipid/LDL goals reviewed.  We discussed several lifestyle modifications today and Emily [name] will continue to work on diet, exercise and weight loss efforts. Orders and follow up as documented in patient record.   Counseling Intensive lifestyle modifications are the first line treatment for this issue. Dietary changes: Increase soluble fiber. Decrease simple carbohydrates. Exercise changes: Moderate to vigorous-intensity aerobic activity  150 minutes per week if tolerated. Lipid-lowering medications: see documented in medical record.   The 10-year ASCVD risk score (Arnett DK, et al., 2019) is: 7.3%   Values used to calculate the score:     Age: 62 years     Sex: Female     Is Non-Hispanic African American: No     Diabetic: No     Tobacco smoker: No     Systolic Blood Pressure: 250 mmHg     Is BP treated: Yes     HDL Cholesterol: 47 mg/dL     Total Cholesterol: 214 mg/dL  3. Vitamin D insufficiency We will refill Vit D 50K IU once a week for 1 month with 0 refills.  Side effects discussed.   -Refill Vitamin D, Ergocalciferol, (DRISDOL) 1.25 MG (50000 UNIT) CAPS capsule; Take one pill po every 10 days  Dispense: 4 capsule; Refill: 0  4. Generalized obesity  5. BMI 34.0-34.9,adult Emily [name] is currently in the action stage of change. As such, her goal is to continue with weight loss efforts. She has agreed to the Category 2 Plan.   Exercise goals: All adults should avoid inactivity. Some physical activity is better than none, and adults who participate in any amount of physical activity gain some health benefits.  Behavioral modification strategies: increasing lean protein intake, increasing vegetables, and increasing water intake.  Emily [name] has agreed to follow-up with our clinic in 4 weeks. She was informed of the importance of frequent follow-up visits to maximize her success with intensive lifestyle modifications for her multiple health conditions.   Objective:   Blood pressure 132/66, pulse  69, temperature 98.2 F (36.8 C), height 5\' 5"  (1.651 m), weight 206 lb (93.4 kg), SpO2 96 %. Body mass index is 34.28 kg/m.  General: Cooperative, alert, well developed, in no acute distress. HEENT: Conjunctivae and lids unremarkable. Cardiovascular: Regular rhythm.  Lungs: Normal work of breathing. Neurologic: No focal deficits.   Lab Results  Component Value Date   CREATININE 0.67 10/06/2022   BUN 20 10/06/2022   NA 142  10/06/2022   K 4.4 10/06/2022   CL 105 10/06/2022   CO2 21 10/06/2022   Lab Results  Component Value Date   ALT 25 10/06/2022   AST 21 10/06/2022   ALKPHOS 87 10/06/2022   BILITOT 0.3 10/06/2022   Lab Results  Component Value Date   HGBA1C 5.4 10/06/2022   HGBA1C 5.2 03/13/2022   HGBA1C 5.9 (H) 10/15/2021   HGBA1C 5.5 01/21/2021   HGBA1C 5.6 02/28/2020   Lab Results  Component Value Date   INSULIN 16.2 10/06/2022   INSULIN 21.9 10/24/2021   INSULIN 33.8 (H) 11/17/2018   Lab Results  Component Value Date   TSH 0.593 06/03/2022   Lab Results  Component Value Date   CHOL 214 (H) 10/06/2022   HDL 47 10/06/2022   LDLCALC 134 (H) 10/06/2022   TRIG 184 (H) 10/06/2022   Lab Results  Component Value Date   VD25OH 36.9 10/06/2022   VD25OH 61.1 06/03/2022   VD25OH 30.1 03/13/2022   Lab Results  Component Value Date   WBC 8.5 10/27/2022   HGB 13.1 10/27/2022   HCT 39.9 10/27/2022   MCV 93 10/27/2022   PLT 270 10/27/2022   No results found for: "IRON", "TIBC", "FERRITIN"  Attestation Statements:   Reviewed by clinician on day of visit: allergies, medications, problem list, medical history, surgical history, family history, social history, and previous encounter notes.  I, Brendell Tyus, RMA, am acting as transcriptionist for Everardo Pacific, FNP.  I have reviewed the above documentation for accuracy and completeness, and I agree with the above. Everardo Pacific, FNP

## 2022-12-08 ENCOUNTER — Telehealth (INDEPENDENT_AMBULATORY_CARE_PROVIDER_SITE_OTHER): Payer: Medicare Other | Admitting: Nurse Practitioner

## 2022-12-08 ENCOUNTER — Encounter: Payer: Self-pay | Admitting: Nurse Practitioner

## 2022-12-08 DIAGNOSIS — H9209 Otalgia, unspecified ear: Secondary | ICD-10-CM

## 2022-12-08 MED ORDER — AMOXICILLIN 500 MG PO CAPS: 500.0000 mg | ORAL_CAPSULE | Freq: Two times a day (BID) | ORAL | 0 refills | Status: AC

## 2022-12-08 NOTE — Progress Notes (Signed)
There were no vitals taken for this visit.   Subjective:    Patient ID: Emily [name], female    DOB: 1961-06-22, 62 y.o.   MRN: [id number]  HPI: Emily [name] is a 62 y.o. female  Chief Complaint  Patient presents with   Facial Swelling    Patient says she currently has an infection behind her R ear and it is swollen up and it is radiating down into her neck and making one side of her throat hurt. Patient says she first noticed about 3-4 days ago. Patient declines any COVID or Flu testing.    EAR PAIN Duration: days Involved ear(s): right Severity:  moderate  Quality:  throbbing- goes down into her neck Fever: no Otorrhea: no Upper respiratory infection symptoms: yes Pruritus: no Hearing loss: no Water immersion no Using Q-tips: no Recurrent otitis media: no Status: stable Treatments attempted: none Patient states she has been taking tylenol but it isn't helping with the pain.  Not taking any over the counter cough or cold medication.  States she was sick about two weeks ago with cough and congestion.  Cough is still ongoing.    Relevant past medical, surgical, family and social history reviewed and updated as indicated. Interim medical history since our last visit reviewed. Allergies and medications reviewed and updated.  Review of Systems  HENT:  Positive for congestion, ear pain and sore throat.   Respiratory:  Positive for cough.     Per HPI unless specifically indicated above     Objective:    There were no vitals taken for this visit.  Wt Readings from Last 3 Encounters:  11/18/22 206 lb (93.4 kg)  10/27/22 207 lb 3.2 oz (94 kg)  10/06/22 197 lb (89.4 kg)    Physical Exam Vitals and nursing note reviewed.  HENT:     Head: Normocephalic.     Right Ear: Hearing normal.     Left Ear: Hearing normal.     Nose: Nose normal.  Eyes:     Pupils: Pupils are equal, round, and reactive to light.  Pulmonary:     Effort: Pulmonary effort is normal. No  respiratory distress.  Neurological:     Mental Status: She is alert.  Psychiatric:        Mood and Affect: Mood normal.        Behavior: Behavior normal.        Thought Content: Thought content normal.        Judgment: Judgment normal.     Results for orders placed or performed in visit on 10/27/22  Urine Culture   Specimen: Urine   UR  Result Value Ref Range   Urine Culture, Routine Final report    Organism ID, Bacteria No growth   Microscopic Examination   Urine  Result Value Ref Range   WBC, UA 0-5 0 - 5 /hpf   RBC, Urine None seen 0 - 2 /hpf   Epithelial Cells (non renal) 0-10 0 - 10 /hpf   Bacteria, UA None seen None seen/Few  Urinalysis, Routine w reflex microscopic  Result Value Ref Range   Specific Gravity, UA 1.020 1.005 - 1.030   pH, UA 5.5 5.0 - 7.5   Color, UA Yellow Yellow   Appearance Ur Clear Clear   Leukocytes,UA 1+ (A) Negative   Protein,UA Negative Negative/Trace   Glucose, UA Negative Negative   Ketones, UA Negative Negative   RBC, UA Negative Negative   Bilirubin, UA Negative Negative  Urobilinogen, Ur 0.2 0.2 - 1.0 mg/dL   Nitrite, UA Negative Negative   Microscopic Examination See below:   CBC with Differential/Platelet  Result Value Ref Range   WBC 8.5 3.4 - 10.8 x10E3/uL   RBC 4.29 3.77 - 5.28 x10E6/uL   Hemoglobin 13.1 11.1 - 15.9 g/dL   Hematocrit 39.9 34.0 - 46.6 %   MCV 93 79 - 97 fL   MCH 30.5 26.6 - 33.0 pg   MCHC 32.8 31.5 - 35.7 g/dL   RDW 12.0 11.7 - 15.4 %   Platelets 270 150 - 450 x10E3/uL   Neutrophils 39 Not Estab. %   Lymphs 49 Not Estab. %   Monocytes 8 Not Estab. %   Eos 3 Not Estab. %   Basos 1 Not Estab. %   Neutrophils Absolute 3.3 1.4 - 7.0 x10E3/uL   Lymphocytes Absolute 4.1 (H) 0.7 - 3.1 x10E3/uL   Monocytes Absolute 0.7 0.1 - 0.9 x10E3/uL   EOS (ABSOLUTE) 0.3 0.0 - 0.4 x10E3/uL   Basophils Absolute 0.1 0.0 - 0.2 x10E3/uL   Immature Granulocytes 0 Not Estab. %   Immature Grans (Abs) 0.0 0.0 - 0.1 x10E3/uL       Assessment & Plan:   Problem List Items Addressed This Visit   None Visit Diagnoses     Otalgia, unspecified laterality    -  Primary   Will treat with amoxicillin.  If symptoms do not improve recommend patient be seen in the office for further evaluation and management.        Follow up plan: No follow-ups on file.   This visit was completed via MyChart due to the restrictions of the COVID-19 pandemic. All issues as above were discussed and addressed. Physical exam was done as above through visual confirmation on MyChart. If it was felt that the patient should be evaluated in the office, they were directed there. The patient verbally consented to this visit. Location of the patient: Home Location of the provider: Office Those involved with this call:  Provider: Jon Billings, NP CMA: Irena Reichmann, [place] Desk/Registration: Lynnell Catalan This encounter was conducted via video.  I spent 20 dedicated to the care of this patient on the date of this encounter to include previsit review of symptoms, plan of care and follow up, face to face time with the patient, and post visit ordering of testing.

## 2022-12-15 DIAGNOSIS — L281 Prurigo nodularis: Secondary | ICD-10-CM | POA: Diagnosis not present

## 2022-12-15 DIAGNOSIS — L738 Other specified follicular disorders: Secondary | ICD-10-CM | POA: Diagnosis not present

## 2022-12-18 ENCOUNTER — Other Ambulatory Visit: Payer: Self-pay | Admitting: Family Medicine

## 2022-12-18 ENCOUNTER — Ambulatory Visit: Payer: Medicare Other | Admitting: Nurse Practitioner

## 2022-12-18 DIAGNOSIS — E038 Other specified hypothyroidism: Secondary | ICD-10-CM

## 2022-12-22 ENCOUNTER — Other Ambulatory Visit: Payer: Self-pay | Admitting: Family Medicine

## 2022-12-22 ENCOUNTER — Other Ambulatory Visit: Payer: Self-pay | Admitting: Nurse Practitioner

## 2022-12-22 DIAGNOSIS — I10 Essential (primary) hypertension: Secondary | ICD-10-CM

## 2022-12-22 DIAGNOSIS — E559 Vitamin D deficiency, unspecified: Secondary | ICD-10-CM

## 2022-12-23 NOTE — Telephone Encounter (Signed)
Requested Prescriptions  Refused Prescriptions Disp Refills   hydrochlorothiazide (HYDRODIURIL) 25 MG tablet [Pharmacy Med Name: HYDROCHLOROTHIAZIDE TABS '25MG'$ ] 90 tablet 3    Sig: TAKE 1 TABLET DAILY     Cardiovascular: Diuretics - Thiazide Passed - 12/22/2022 12:52 PM      Passed - Cr in normal range and within 180 days    Creatinine  Date Value Ref Range Status  10/24/2014 0.76 0.60 - 1.30 mg/dL Final   Creatinine, Ser  Date Value Ref Range Status  10/06/2022 0.67 0.57 - 1.00 mg/dL Final         Passed - K in normal range and within 180 days    Potassium  Date Value Ref Range Status  10/06/2022 4.4 3.5 - 5.2 mmol/L Final  10/24/2014 3.9 3.5 - 5.1 mmol/L Final         Passed - Na in normal range and within 180 days    Sodium  Date Value Ref Range Status  10/06/2022 142 134 - 144 mmol/L Final  10/24/2014 139 136 - 145 mmol/L Final         Passed - Last BP in normal range    BP Readings from Last 1 Encounters:  11/18/22 132/66         Passed - Valid encounter within last 6 months    Recent Outpatient Visits           2 weeks ago Otalgia, unspecified laterality   [place], NP   1 month ago [place], DO   4 months ago Urinary tract infection with hematuria, site unspecified   [place], Dani Gobble, PA-C   9 months ago Routine general medical examination at a health care facility   [facility], Megan P, DO   10 months ago Pain with urination   [place] [facility] Mecum, Dani Gobble, PA-C               venlafaxine XR (EFFEXOR-XR) 150 MG 24 hr capsule [Pharmacy Med Name: VENLAFAXINE HCL ER CAPS '150MG'$ ] 90 capsule 3    Sig: TAKE 1 CAPSULE DAILY WITH BREAKFAST (COURTESY REFILL, NEED OFFICE VISIT)     Psychiatry: Antidepressants - SNRI - desvenlafaxine & venlafaxine Failed - 12/22/2022 12:52 PM       Failed - Lipid Panel in normal range within the last 12 months    Cholesterol, Total  Date Value Ref Range Status  10/06/2022 214 (H) 100 - 199 mg/dL Final   LDL Chol Calc (NIH)  Date Value Ref Range Status  10/06/2022 134 (H) 0 - 99 mg/dL Final   HDL  Date Value Ref Range Status  10/06/2022 47 >39 mg/dL Final   Triglycerides  Date Value Ref Range Status  10/06/2022 184 (H) 0 - 149 mg/dL Final         Passed - Cr in normal range and within 360 days    Creatinine  Date Value Ref Range Status  10/24/2014 0.76 0.60 - 1.30 mg/dL Final   Creatinine, Ser  Date Value Ref Range Status  10/06/2022 0.67 0.57 - 1.00 mg/dL Final         Passed - Completed PHQ-2 or PHQ-9 in the last 360 days      Passed - Last BP in normal range    BP Readings from Last 1 Encounters:  11/18/22 132/66  Passed - Valid encounter within last 6 months    Recent Outpatient Visits           2 weeks ago Otalgia, unspecified laterality   [place], NP   1 month ago [place], DO   4 months ago Urinary tract infection with hematuria, site unspecified   [place] [facility] Mecum, Dani Gobble, PA-C   9 months ago Routine general medical examination at a health care facility   [facility], Megan P, DO   10 months ago Pain with urination   [place], Dani Gobble, PA-C

## 2022-12-24 ENCOUNTER — Other Ambulatory Visit: Payer: Self-pay | Admitting: Nurse Practitioner

## 2022-12-24 ENCOUNTER — Ambulatory Visit: Payer: Medicare Other | Admitting: Nurse Practitioner

## 2022-12-24 DIAGNOSIS — E559 Vitamin D deficiency, unspecified: Secondary | ICD-10-CM

## 2022-12-24 DIAGNOSIS — R632 Polyphagia: Secondary | ICD-10-CM

## 2022-12-24 MED ORDER — TRULICITY 3 MG/0.5ML ~~LOC~~ SOAJ: 3.0000 mg | SUBCUTANEOUS | 0 refills | Status: DC

## 2022-12-24 MED ORDER — VITAMIN D (ERGOCALCIFEROL) 1.25 MG (50000 UNIT) PO CAPS: ORAL_CAPSULE | ORAL | 0 refills | Status: DC

## 2023-01-13 ENCOUNTER — Ambulatory Visit: Payer: PRIVATE HEALTH INSURANCE | Admitting: Nurse Practitioner

## 2023-01-14 ENCOUNTER — Telehealth (INDEPENDENT_AMBULATORY_CARE_PROVIDER_SITE_OTHER): Payer: Self-pay | Admitting: Nurse Practitioner

## 2023-01-14 NOTE — Telephone Encounter (Signed)
Spoke with spouse in regard to no shows.  I have informed him of the no show and cancellation policy.

## 2023-02-02 ENCOUNTER — Ambulatory Visit (INDEPENDENT_AMBULATORY_CARE_PROVIDER_SITE_OTHER): Payer: Medicare Other | Admitting: Nurse Practitioner

## 2023-02-02 ENCOUNTER — Encounter: Payer: Self-pay | Admitting: Nurse Practitioner

## 2023-02-02 VITALS — BP 136/85 | HR 78 | Temp 98.2°F | Ht 65.0 in | Wt 207.0 lb

## 2023-02-02 DIAGNOSIS — Z6834 Body mass index (BMI) 34.0-34.9, adult: Secondary | ICD-10-CM

## 2023-02-02 DIAGNOSIS — R632 Polyphagia: Secondary | ICD-10-CM

## 2023-02-02 DIAGNOSIS — E669 Obesity, unspecified: Secondary | ICD-10-CM

## 2023-02-02 DIAGNOSIS — E559 Vitamin D deficiency, unspecified: Secondary | ICD-10-CM | POA: Diagnosis not present

## 2023-02-02 MED ORDER — TRULICITY 3 MG/0.5ML ~~LOC~~ SOAJ: 3.0000 mg | SUBCUTANEOUS | 0 refills | Status: DC

## 2023-02-02 MED ORDER — VITAMIN D (ERGOCALCIFEROL) 1.25 MG (50000 UNIT) PO CAPS: ORAL_CAPSULE | ORAL | 0 refills | Status: DC

## 2023-02-02 NOTE — Progress Notes (Signed)
Office: [phone]  /  Fax: [phone]  WEIGHT SUMMARY AND BIOMETRICS  No data recorded Weight Gained Since Last Visit: 1lb   Vitals Temp: 98.2 F (36.8 C) BP: 136/85 Pulse Rate: 78 SpO2: 97 %   Anthropometric Measurements Height:  (1.651 m) Weight: 207 lb (93.9 kg) BMI (Calculated): 34.45 Weight at Last Visit: 206lb Weight Gained Since Last Visit: 1lb Starting Weight: 218lb Total Weight Loss (lbs): 11 lb (4.99 kg)   Body Composition  Body Fat %: 45.6 % Fat Mass (lbs): 94.8 lbs Muscle Mass (lbs): 107.2 lbs Total Body Water (lbs): 71 lbs Visceral Fat Rating : 13   Other Clinical Data Today's Visit #: 16 Starting Date: 10/24/21     HPI  Chief Complaint: OBESITY  Emily [name] is here to discuss her progress with her obesity treatment plan. She is on the the Category 2 Plan and states she is following her eating plan approximately 0 % of the time. She states she is exercising 0 minutes 0 days per week.   Interval History:  Since last office visit she has gained 1 pound.  She has had a funeral since her last visit.  She has gotten of track.  She has been eating out more.  She is drinking water and sweet tea.     Pharmacotherapy for weight loss: She is not currently taking medications  for medical weight loss.    Bariatric surgery:  Patient has not had bariatric surgery.    Emily [name] Emily [name] is taking Trulicity 3 mg.  Denies any side effects.  Continues to struggle with hunger and cravings.  Vit D deficiency  She is taking Vit D 50,000 IU every 10 days.  Denies side effects.  Denies nausea, vomiting or muscle weakness.    Lab Results  Component Value Date   VD25OH 36.9 10/06/2022   VD25OH 61.1 06/03/2022   VD25OH 30.1 03/13/2022     PHYSICAL EXAM:  Blood pressure 136/85, pulse 78, temperature 98.2 F (36.8 C), height  (1.651 m), weight 207 lb (93.9 kg), SpO2 97 %. Body mass index is 34.45 kg/m.  General: She is overweight, cooperative,  alert, well developed, and in no acute distress. PSYCH: Has normal mood, affect and thought process.   Extremities: No edema.  Neurologic: No gross sensory or motor deficits. No tremors or fasciculations noted.    DIAGNOSTIC DATA REVIEWED:  BMET    Component Value Date/Time   NA 142 10/06/2022 1152   NA 139 10/24/2014 0708   K 4.4 10/06/2022 1152   K 3.9 10/24/2014 0708   CL 105 10/06/2022 1152   CL 106 10/24/2014 0708   CO2 21 10/06/2022 1152   CO2 27 10/24/2014 0708   GLUCOSE 86 10/06/2022 1152   GLUCOSE 97 11/12/2015 1827   GLUCOSE 108 (H) 10/24/2014 0708   BUN 20 10/06/2022 1152   BUN 21 (H) 10/24/2014 0708   CREATININE 0.67 10/06/2022 1152   CREATININE 0.76 10/24/2014 0708   CALCIUM 9.7 10/06/2022 1152   CALCIUM 8.8 10/24/2014 0708   GFRNONAA 80 02/28/2020 1532   GFRNONAA >60 10/24/2014 0708   GFRAA 93 02/28/2020 1532   GFRAA >60 10/24/2014 0708   Lab Results  Component Value Date   HGBA1C 5.4 10/06/2022   HGBA1C 5.6 08/26/2018   Lab Results  Component Value Date   INSULIN 16.2 10/06/2022   INSULIN 33.8 (H) 11/17/2018   Lab Results  Component Value Date   TSH 0.593 06/03/2022   CBC  Component Value Date/Time   WBC 8.5 10/27/2022 0809   WBC 7.5 11/12/2015 1827   RBC 4.29 10/27/2022 0809   RBC 4.44 11/12/2015 1827   HGB 13.1 10/27/2022 0809   HCT 39.9 10/27/2022 0809   PLT 270 10/27/2022 0809   MCV 93 10/27/2022 0809   MCV 94 10/24/2014 0708   MCH 30.5 10/27/2022 0809   MCH 30.9 11/12/2015 1827   MCHC 32.8 10/27/2022 0809   MCHC 33.7 11/12/2015 1827   RDW 12.0 10/27/2022 0809   RDW 13.2 10/24/2014 0708   Iron Studies No results found for: "IRON", "TIBC", "FERRITIN", "IRONPCTSAT" Lipid Panel     Component Value Date/Time   CHOL 214 (H) 10/06/2022 1152   TRIG 184 (H) 10/06/2022 1152   HDL 47 10/06/2022 1152   LDLCALC 134 (H) 10/06/2022 1152   Hepatic Function Panel     Component Value Date/Time   PROT 7.2 10/06/2022 1152   ALBUMIN 4.5  10/06/2022 1152   AST 21 10/06/2022 1152   ALT 25 10/06/2022 1152   ALKPHOS 87 10/06/2022 1152   BILITOT 0.3 10/06/2022 1152      Component Value Date/Time   TSH 0.593 06/03/2022 1557   Nutritional Lab Results  Component Value Date   VD25OH 36.9 10/06/2022   VD25OH 61.1 06/03/2022   VD25OH 30.1 03/13/2022     ASSESSMENT AND PLAN  TREATMENT PLAN FOR OBESITY:  Recommended Dietary Goals  Emily [name] is currently in the action stage of change. As such, her goal is to continue weight management plan. She has agreed to the Category 2 Plan.  Behavioral Intervention  We discussed the following Behavioral Modification Strategies today: increasing lean protein intake, decreasing simple carbohydrates , increasing vegetables, increasing fiber rich foods, avoiding skipping meals, increasing water intake, work on meal planning and preparation, planning for success, and better snacking choices.  Additional resources provided today: NA  Recommended Physical Activity Goals  Emily [name] has been advised to work up to 150 minutes of moderate intensity aerobic activity a week and strengthening exercises 2-3 times per week for cardiovascular health, weight loss maintenance and preservation of muscle mass.   She has agreed to Think about ways to increase physical activity and Work on scheduling and tracking physical activity.    ASSOCIATED CONDITIONS ADDRESSED TODAY  Action/Plan  Emily [name] -     Trulicity; Inject 3 mg as directed once a week.  Dispense: 2 mL; Refill: 0  Intensive lifestyle modifications are the first line treatment for this issue. We discussed several lifestyle modifications today and she will continue to work on diet, exercise and weight loss efforts. Orders and follow up as documented in patient record.  Counseling Emily [name] is excessive hunger. Causes can include: low blood sugars, hypERthyroidism, PMS, lack of sleep, stress, insulin resistance, diabetes, certain medications,  and diets that are deficient in protein and fiber.    Vitamin D insufficiency -     Vitamin D (Ergocalciferol); Take one pill po every 10 days  Dispense: 4 capsule; Refill: 0  Low Vitamin D level contributes to fatigue and are associated with obesity, breast, and colon cancer. She agrees to continue to take prescription Vitamin D @50 ,000 IU every week and will follow-up for routine testing of Vitamin D, at least 2-3 times per year to avoid over-replacement.   Generalized obesity  BMI 34.0-34.9,adult      Goals:  Drink more water Limit sweets Emily [name] more at home   Return in about 2 weeks (around 02/16/2023).Marland Kitchen She was informed of  the importance of frequent follow up visits to maximize her success with intensive lifestyle modifications for her multiple health conditions.   ATTESTASTION STATEMENTS:  Reviewed by clinician on day of visit: allergies, medications, problem list, medical history, surgical history, family history, social history, and previous encounter notes.    Theodis Sato. Adrean Heitz FNP-C

## 2023-02-06 DIAGNOSIS — L03319 Cellulitis of trunk, unspecified: Secondary | ICD-10-CM | POA: Diagnosis not present

## 2023-02-06 DIAGNOSIS — W57XXXA Bitten or stung by nonvenomous insect and other nonvenomous arthropods, initial encounter: Secondary | ICD-10-CM | POA: Diagnosis not present

## 2023-02-09 ENCOUNTER — Other Ambulatory Visit: Payer: Self-pay | Admitting: Nurse Practitioner

## 2023-02-09 DIAGNOSIS — R632 Polyphagia: Secondary | ICD-10-CM

## 2023-02-17 ENCOUNTER — Ambulatory Visit: Payer: PRIVATE HEALTH INSURANCE | Admitting: Nurse Practitioner

## 2023-02-23 ENCOUNTER — Other Ambulatory Visit: Payer: Self-pay | Admitting: Family Medicine

## 2023-02-23 NOTE — Telephone Encounter (Signed)
Medication Refill - Medication: venlafaxine XR (EFFEXOR-XR) 150 MG 24 hr capsule [[id number]]   Has the patient contacted their pharmacy? Yes.    (Agent: If yes, when and what did the pharmacy advise?) Contact PCP   Preferred Pharmacy (with phone number or street name): [id number] - [place], [place] - [address] AT [facility] OF [address]   Has the patient been seen for an appointment in the last year OR does the patient have an upcoming appointment? Yes.    Agent: Please be advised that RX refills may take up to 3 business days. We ask that you follow-up with your pharmacy.

## 2023-02-24 ENCOUNTER — Other Ambulatory Visit: Payer: Self-pay | Admitting: Family Medicine

## 2023-02-24 ENCOUNTER — Telehealth: Payer: Self-pay | Admitting: Family Medicine

## 2023-02-24 DIAGNOSIS — M1711 Unilateral primary osteoarthritis, right knee: Secondary | ICD-10-CM | POA: Diagnosis not present

## 2023-02-24 DIAGNOSIS — M533 Sacrococcygeal disorders, not elsewhere classified: Secondary | ICD-10-CM | POA: Diagnosis not present

## 2023-02-24 MED ORDER — VENLAFAXINE HCL ER 150 MG PO CP24: 150.0000 mg | ORAL_CAPSULE | Freq: Every day | ORAL | 0 refills | Status: DC

## 2023-02-24 NOTE — Telephone Encounter (Signed)
Appt has been made.

## 2023-02-24 NOTE — Telephone Encounter (Signed)
Medication Refill - Medication: venlafaxine XR (EFFEXOR-XR) 150 MG 24 hr capsule   Has the patient contacted their pharmacy? Yes.     Preferred Pharmacy (with phone number or street name):  [id number] - [place], [place] - [address] AT [facility] OF [address] [facility] Phone: [phone]  Fax: [phone]     Has the patient been seen for an appointment in the last year OR does the patient have an upcoming appointment? Yes.      The patient states she already spoke with her provider stating part of her previous prescription was misplaced ans she never found it. She is completely out of this medication and states she is now getting sick because she is off of it. Please assist patient as soon as possible as Dr Laural Benes told her to call if she ever has any problems.

## 2023-02-24 NOTE — Telephone Encounter (Signed)
I have not seen her since January- I am happy to send her in a refill, but she may have to pay out of pocket because the insurance doesn't always cover lost medicine.

## 2023-02-24 NOTE — Telephone Encounter (Signed)
Last RF 10/27/22 #90 1 RF too soon   Requested Prescriptions  Refused Prescriptions Disp Refills   venlafaxine XR (EFFEXOR-XR) 150 MG 24 hr capsule 90 capsule 1    Sig: Take 1 capsule (150 mg total) by mouth daily with breakfast.     Psychiatry: Antidepressants - SNRI - desvenlafaxine & venlafaxine Failed - 02/23/2023  3:44 PM      Failed - Lipid Panel in normal range within the last 12 months    Cholesterol, Total  Date Value Ref Range Status  10/06/2022 214 (H) 100 - 199 mg/dL Final   LDL Chol Calc (NIH)  Date Value Ref Range Status  10/06/2022 134 (H) 0 - 99 mg/dL Final   HDL  Date Value Ref Range Status  10/06/2022 47 >39 mg/dL Final   Triglycerides  Date Value Ref Range Status  10/06/2022 184 (H) 0 - 149 mg/dL Final         Passed - Cr in normal range and within 360 days    Creatinine  Date Value Ref Range Status  10/24/2014 0.76 0.60 - 1.30 mg/dL Final   Creatinine, Ser  Date Value Ref Range Status  10/06/2022 0.67 0.57 - 1.00 mg/dL Final         Passed - Completed PHQ-2 or PHQ-9 in the last 360 days      Passed - Last BP in normal range    BP Readings from Last 1 Encounters:  02/02/23 136/85         Passed - Valid encounter within last 6 months    Recent Outpatient Visits           2 months ago Otalgia, unspecified laterality   [place] [facility] Larae [place], NP   4 months ago Dysuria   [place] [facility] [place], Megan P, DO   6 months ago Urinary tract infection with hematuria, site unspecified   [place] [facility] Mecum, Oswaldo Conroy, PA-C   11 months ago Routine general medical examination at a health care facility   [facility] [place], [place] P, DO   1 year ago Pain with urination   [facility] Mecum, Oswaldo Conroy, PA-C

## 2023-02-25 ENCOUNTER — Ambulatory Visit: Payer: Medicare Other | Admitting: Family Medicine

## 2023-02-26 ENCOUNTER — Ambulatory Visit: Payer: Medicare Other | Admitting: Nurse Practitioner

## 2023-02-26 VITALS — BP 148/75 | HR 73 | Temp 98.5°F | Ht 65.0 in | Wt 212.0 lb

## 2023-02-26 DIAGNOSIS — E669 Obesity, unspecified: Secondary | ICD-10-CM | POA: Diagnosis not present

## 2023-02-26 DIAGNOSIS — E559 Vitamin D deficiency, unspecified: Secondary | ICD-10-CM

## 2023-02-26 DIAGNOSIS — Z6835 Body mass index (BMI) 35.0-35.9, adult: Secondary | ICD-10-CM | POA: Diagnosis not present

## 2023-02-26 DIAGNOSIS — R632 Polyphagia: Secondary | ICD-10-CM | POA: Diagnosis not present

## 2023-02-26 MED ORDER — TRULICITY 3 MG/0.5ML ~~LOC~~ SOAJ: 3.0000 mg | SUBCUTANEOUS | 0 refills | Status: DC

## 2023-02-26 MED ORDER — VITAMIN D (ERGOCALCIFEROL) 1.25 MG (50000 UNIT) PO CAPS: ORAL_CAPSULE | ORAL | 0 refills | Status: DC

## 2023-02-26 NOTE — Progress Notes (Signed)
Office: [phone]  /  Fax: [phone]  WEIGHT SUMMARY AND BIOMETRICS  No data recorded Weight Gained Since Last Visit: 5lb   Vitals Temp: 98.5 F (36.9 C) BP: (!) 148/75 Pulse Rate: 73 SpO2: 97 %   Anthropometric Measurements Height: 5\' 5"  (1.651 m) Weight: 212 lb (96.2 kg) BMI (Calculated): 35.28 Weight at Last Visit: 207lb Weight Gained Since Last Visit: 5lb Starting Weight: 218lb Total Weight Loss (lbs): 6 lb (2.722 kg)   Body Composition  Body Fat %: 47 % Fat Mass (lbs): 100 lbs Muscle Mass (lbs): 106.8 lbs Total Body Water (lbs): 74.6 lbs Visceral Fat Rating : 14   Other Clinical Data Fasting: No Labs: No Today's Visit #: 17 Starting Date: 10/24/21     HPI  Chief Complaint: OBESITY  Emily [name] is here to discuss her progress with her obesity treatment plan. She is on the the Category 2 Plan and states she is following her eating plan approximately 50 % of the time. She states she is exercising 0 minutes 0 days per week. She is planning to open up her pool next week    Interval History:  Since last office visit she has gained 5 pounds.  She is under a lot of stress and has been stress eating.  She is struggling with right knee and back pain.  Saw ortho yesterday and had a steroid injection yesterday.  Her husband recently had a stent placed.  She went to a wedding last weekend.  Her daughter and son are also having some health problems.    Vit D deficiency  She is taking Vit D 50,000 IU weekly.  Denies side effects.  Denies nausea, vomiting or muscle weakness.    Lab Results  Component Value Date   VD25OH 36.9 10/06/2022   VD25OH 61.1 06/03/2022   VD25OH 30.1 03/13/2022     Polyphagia Emily [name] is taking Trulicity 3 mg.  Denies any side effects. Helps with polyphagia and cravings.    PHYSICAL EXAM:  Blood pressure (!) 148/75, pulse 73, temperature 98.5 F (36.9 C), height 5\' 5"  (1.651 m), weight 212 lb (96.2 kg), SpO2 97 %. Body mass index is  35.28 kg/m.  General: She is overweight, cooperative, alert, well developed, and in no acute distress. PSYCH: Has normal mood, affect and thought process.   Extremities: No edema.  Neurologic: No gross sensory or motor deficits. No tremors or fasciculations noted.    DIAGNOSTIC DATA REVIEWED:  BMET    Component Value Date/Time   NA 142 10/06/2022 1152   NA 139 10/24/2014 0708   K 4.4 10/06/2022 1152   K 3.9 10/24/2014 0708   CL 105 10/06/2022 1152   CL 106 10/24/2014 0708   CO2 21 10/06/2022 1152   CO2 27 10/24/2014 0708   GLUCOSE 86 10/06/2022 1152   GLUCOSE 97 11/12/2015 1827   GLUCOSE 108 (H) 10/24/2014 0708   BUN 20 10/06/2022 1152   BUN 21 (H) 10/24/2014 0708   CREATININE 0.67 10/06/2022 1152   CREATININE 0.76 10/24/2014 0708   CALCIUM 9.7 10/06/2022 1152   CALCIUM 8.8 10/24/2014 0708   GFRNONAA 80 02/28/2020 1532   GFRNONAA >60 10/24/2014 0708   GFRAA 93 02/28/2020 1532   GFRAA >60 10/24/2014 0708   Lab Results  Component Value Date   HGBA1C 5.4 10/06/2022   HGBA1C 5.6 08/26/2018   Lab Results  Component Value Date   INSULIN 16.2 10/06/2022   INSULIN 33.8 (H) 11/17/2018   Lab Results  Component Value  Date   TSH 0.593 06/03/2022   CBC    Component Value Date/Time   WBC 8.5 10/27/2022 0809   WBC 7.5 11/12/2015 1827   RBC 4.29 10/27/2022 0809   RBC 4.44 11/12/2015 1827   HGB 13.1 10/27/2022 0809   HCT 39.9 10/27/2022 0809   PLT 270 10/27/2022 0809   MCV 93 10/27/2022 0809   MCV 94 10/24/2014 0708   MCH 30.5 10/27/2022 0809   MCH 30.9 11/12/2015 1827   MCHC 32.8 10/27/2022 0809   MCHC 33.7 11/12/2015 1827   RDW 12.0 10/27/2022 0809   RDW 13.2 10/24/2014 0708   Iron Studies No results found for: "IRON", "TIBC", "FERRITIN", "IRONPCTSAT" Lipid Panel     Component Value Date/Time   CHOL 214 (H) 10/06/2022 1152   TRIG 184 (H) 10/06/2022 1152   HDL 47 10/06/2022 1152   LDLCALC 134 (H) 10/06/2022 1152   Hepatic Function Panel     Component  Value Date/Time   PROT 7.2 10/06/2022 1152   ALBUMIN 4.5 10/06/2022 1152   AST 21 10/06/2022 1152   ALT 25 10/06/2022 1152   ALKPHOS 87 10/06/2022 1152   BILITOT 0.3 10/06/2022 1152      Component Value Date/Time   TSH 0.593 06/03/2022 1557   Nutritional Lab Results  Component Value Date   VD25OH 36.9 10/06/2022   VD25OH 61.1 06/03/2022   VD25OH 30.1 03/13/2022     ASSESSMENT AND PLAN  TREATMENT PLAN FOR OBESITY:  Recommended Dietary Goals  Emily [name] is currently in the action stage of change. As such, her goal is to continue weight management plan. She has agreed to the Category 2 Plan.  Behavioral Intervention  We discussed the following Behavioral Modification Strategies today: increasing lean protein intake, decreasing simple carbohydrates , increasing vegetables, increasing lower glycemic fruits, increasing fiber rich foods, avoiding skipping meals, increasing water intake, continue to practice mindfulness when eating, and planning for success.  Additional resources provided today: NA  Recommended Physical Activity Goals  Emily [name] has been advised to work up to 150 minutes of moderate intensity aerobic activity a week and strengthening exercises 2-3 times per week for cardiovascular health, weight loss maintenance and preservation of muscle mass.   She has agreed to Think about ways to increase daily physical activity and overcoming barriers to exercise, Increase physical activity in their day and reduce sedentary time (increase NEAT)., and Work on scheduling and tracking physical activity.      ASSOCIATED CONDITIONS ADDRESSED TODAY  Action/Plan  Vitamin D insufficiency -     Vitamin D (Ergocalciferol); Take one pill po every 10 days  Dispense: 4 capsule; Refill: 0  Low Vitamin D level contributes to fatigue and are associated with obesity, breast, and colon cancer. She agrees to continue to take prescription Vitamin D @50 ,000 IU every week and will follow-up for  routine testing of Vitamin D, at least 2-3 times per year to avoid over-replacement.   Polyphagia -     Trulicity; Inject 3 mg as directed once a week.  Dispense: 2 mL; Refill: 0.   Intensive lifestyle modifications are the first line treatment for this issue. We discussed several lifestyle modifications today and she will continue to work on diet, exercise and weight loss efforts. Orders and follow up as documented in patient record.  Counseling Polyphagia is excessive hunger. Causes can include: low blood sugars, hypERthyroidism, PMS, lack of sleep, stress, insulin resistance, diabetes, certain medications, and diets that are deficient in protein and fiber.  Generalized obesity  BMI 35.0-35.9,adult       She is scheduled for CPE and labs in July.   Return in about 4 weeks (around 03/26/2023).Marland Kitchen She was informed of the importance of frequent follow up visits to maximize her success with intensive lifestyle modifications for her multiple health conditions.   ATTESTASTION STATEMENTS:  Reviewed by clinician on day of visit: allergies, medications, problem list, medical history, surgical history, family history, social history, and previous encounter notes.     Theodis Sato. Emily Khaimov FNP-C

## 2023-03-31 ENCOUNTER — Ambulatory Visit (INDEPENDENT_AMBULATORY_CARE_PROVIDER_SITE_OTHER): Payer: Medicare Other | Admitting: Nurse Practitioner

## 2023-03-31 ENCOUNTER — Encounter: Payer: Self-pay | Admitting: Nurse Practitioner

## 2023-03-31 VITALS — BP 146/81 | HR 74 | Temp 98.0°F | Ht 65.0 in | Wt 208.0 lb

## 2023-03-31 DIAGNOSIS — Z6834 Body mass index (BMI) 34.0-34.9, adult: Secondary | ICD-10-CM | POA: Diagnosis not present

## 2023-03-31 DIAGNOSIS — R632 Polyphagia: Secondary | ICD-10-CM

## 2023-03-31 DIAGNOSIS — E669 Obesity, unspecified: Secondary | ICD-10-CM | POA: Diagnosis not present

## 2023-03-31 DIAGNOSIS — E559 Vitamin D deficiency, unspecified: Secondary | ICD-10-CM | POA: Diagnosis not present

## 2023-03-31 MED ORDER — TRULICITY 3 MG/0.5ML ~~LOC~~ SOAJ: 3.0000 mg | SUBCUTANEOUS | 0 refills | Status: DC

## 2023-03-31 MED ORDER — VITAMIN D (ERGOCALCIFEROL) 1.25 MG (50000 UNIT) PO CAPS: ORAL_CAPSULE | ORAL | 0 refills | Status: DC

## 2023-03-31 NOTE — Progress Notes (Signed)
Office: [phone]  /  Fax: [phone]  WEIGHT SUMMARY AND BIOMETRICS  Weight Lost Since Last Visit: 4lb  No data recorded  Vitals Temp: 98 F (36.7 C) BP: (!) 146/81 Pulse Rate: 74 SpO2: 98 %   Anthropometric Measurements Height: 5\' 5"  (1.651 m) Weight: 208 lb (94.3 kg) BMI (Calculated): 34.61 Weight at Last Visit: 212lb Weight Lost Since Last Visit: 4lb Starting Weight: 218lb Total Weight Loss (lbs): 10 lb (4.536 kg)   Body Composition  Body Fat %: 45.7 % Fat Mass (lbs): 95 lbs Muscle Mass (lbs): 107.2 lbs Total Body Water (lbs): 71.6 lbs Visceral Fat Rating : 13   Other Clinical Data Fasting: No Labs: No Today's Visit #: 18 Starting Date: 10/24/21     HPI  Chief Complaint: OBESITY  Emily [name] is here to discuss her progress with her obesity treatment plan. She is on the the Category 2 Plan and states she is following her eating plan approximately 50 % of the time. She states she is exercising 0 minutes 0 days per week.   Interval History:  Since last office visit she has lost 4 pounds. She is watching her portion sizes and making healthier choices.  She is eating a protein with each meal.  Not eating out as much.   She is drinking more water. Drinks sweet tea occasionally.  She is struggling with right knee and back pain. She got an injection in her knee 3 weeks ago. She is starting PT next week.     Vit D deficiency  She is taking Vit D 50,000 IU weekly.  Denies side effects.  Denies nausea, vomiting or muscle weakness.    Lab Results  Component Value Date   VD25OH 36.9 10/06/2022   VD25OH 61.1 06/03/2022   VD25OH 30.1 03/13/2022     Polyphagia Currently this is moderately controlled. Medication(s): Trulicity 3.0 mg SQ weekly.  Reported side effects: None    PHYSICAL EXAM:  Blood pressure (!) 146/81, pulse 74, temperature 98 F (36.7 C), height 5\' 5"  (1.651 m), weight 208 lb (94.3 kg), SpO2 98 %. Body mass index is 34.61  kg/m.  General: She is overweight, cooperative, alert, well developed, and in no acute distress. PSYCH: Has normal mood, affect and thought process.   Extremities: No edema.  Neurologic: No gross sensory or motor deficits. No tremors or fasciculations noted.    DIAGNOSTIC DATA REVIEWED:  BMET    Component Value Date/Time   NA 142 10/06/2022 1152   NA 139 10/24/2014 0708   K 4.4 10/06/2022 1152   K 3.9 10/24/2014 0708   CL 105 10/06/2022 1152   CL 106 10/24/2014 0708   CO2 21 10/06/2022 1152   CO2 27 10/24/2014 0708   GLUCOSE 86 10/06/2022 1152   GLUCOSE 97 11/12/2015 1827   GLUCOSE 108 (H) 10/24/2014 0708   BUN 20 10/06/2022 1152   BUN 21 (H) 10/24/2014 0708   CREATININE 0.67 10/06/2022 1152   CREATININE 0.76 10/24/2014 0708   CALCIUM 9.7 10/06/2022 1152   CALCIUM 8.8 10/24/2014 0708   GFRNONAA 80 02/28/2020 1532   GFRNONAA >60 10/24/2014 0708   GFRAA 93 02/28/2020 1532   GFRAA >60 10/24/2014 0708   Lab Results  Component Value Date   HGBA1C 5.4 10/06/2022   HGBA1C 5.6 08/26/2018   Lab Results  Component Value Date   INSULIN 16.2 10/06/2022   INSULIN 33.8 (H) 11/17/2018   Lab Results  Component Value Date   TSH 0.593 06/03/2022  CBC    Component Value Date/Time   WBC 8.5 10/27/2022 0809   WBC 7.5 11/12/2015 1827   RBC 4.29 10/27/2022 0809   RBC 4.44 11/12/2015 1827   HGB 13.1 10/27/2022 0809   HCT 39.9 10/27/2022 0809   PLT 270 10/27/2022 0809   MCV 93 10/27/2022 0809   MCV 94 10/24/2014 0708   MCH 30.5 10/27/2022 0809   MCH 30.9 11/12/2015 1827   MCHC 32.8 10/27/2022 0809   MCHC 33.7 11/12/2015 1827   RDW 12.0 10/27/2022 0809   RDW 13.2 10/24/2014 0708   Iron Studies No results found for: "IRON", "TIBC", "FERRITIN", "IRONPCTSAT" Lipid Panel     Component Value Date/Time   CHOL 214 (H) 10/06/2022 1152   TRIG 184 (H) 10/06/2022 1152   HDL 47 10/06/2022 1152   LDLCALC 134 (H) 10/06/2022 1152   Hepatic Function Panel     Component Value  Date/Time   PROT 7.2 10/06/2022 1152   ALBUMIN 4.5 10/06/2022 1152   AST 21 10/06/2022 1152   ALT 25 10/06/2022 1152   ALKPHOS 87 10/06/2022 1152   BILITOT 0.3 10/06/2022 1152      Component Value Date/Time   TSH 0.593 06/03/2022 1557   Nutritional Lab Results  Component Value Date   VD25OH 36.9 10/06/2022   VD25OH 61.1 06/03/2022   VD25OH 30.1 03/13/2022     ASSESSMENT AND PLAN  TREATMENT PLAN FOR OBESITY:  Recommended Dietary Goals  Destani is currently in the action stage of change. As such, her goal is to continue weight management plan. She has agreed to the Category 2 Plan.  Behavioral Intervention  We discussed the following Behavioral Modification Strategies today: increasing lean protein intake, decreasing simple carbohydrates , increasing vegetables, increasing lower glycemic fruits, increasing water intake, keeping healthy foods at home, identifying sources and decreasing liquid calories, avoiding temptations and identifying enticing environmental cues, continue to practice mindfulness when eating, and planning for success.  Additional resources provided today: NA  Recommended Physical Activity Goals  Johnette has been advised to work up to 150 minutes of moderate intensity aerobic activity a week and strengthening exercises 2-3 times per week for cardiovascular health, weight loss maintenance and preservation of muscle mass.   She has agreed to Think about ways to increase daily physical activity and overcoming barriers to exercise and Increase physical activity in their day and reduce sedentary time (increase NEAT).   ASSOCIATED CONDITIONS ADDRESSED TODAY  Action/Plan  Vitamin D insufficiency -     Vitamin D (Ergocalciferol); Take one pill po every 10 days  Dispense: 4 capsule; Refill: 0. Side effects discussed.   Polyphagia -     Trulicity; Inject 3 mg as directed once a week.  Dispense: 2 mL; Refill: 0.  Side effects discussed.   Generalized obesity  BMI  34.0-34.9,adult       Will obtain labs at next visit.    Return in about 4 weeks (around 04/28/2023).Marland Kitchen She was informed of the importance of frequent follow up visits to maximize her success with intensive lifestyle modifications for her multiple health conditions.   ATTESTASTION STATEMENTS:  Reviewed by clinician on day of visit: allergies, medications, problem list, medical history, surgical history, family history, social history, and previous encounter notes.     Theodis Sato. Tickerhoff FNP-C

## 2023-04-07 ENCOUNTER — Other Ambulatory Visit: Payer: Self-pay | Admitting: Family Medicine

## 2023-04-07 DIAGNOSIS — E038 Other specified hypothyroidism: Secondary | ICD-10-CM

## 2023-04-07 NOTE — Telephone Encounter (Signed)
Rx 12/19/22 #90 1RF- change in pharmacy- remainder of Rx sent to mail order pharmacy Requested Prescriptions  Pending Prescriptions Disp Refills   levothyroxine (SYNTHROID) 137 MCG tablet [Pharmacy Med Name: L-THYROXINE (SYNTHROID) TABS 137MCG] 90 tablet 0    Sig: TAKE 1 TABLET DAILY BEFORE BREAKFAST     Endocrinology:  Hypothyroid Agents Passed - 04/07/2023 12:54 AM      Passed - TSH in normal range and within 360 days    TSH  Date Value Ref Range Status  06/03/2022 0.593 0.450 - 4.500 uIU/mL Final         Passed - Valid encounter within last 12 months    Recent Outpatient Visits           4 months ago Otalgia, unspecified laterality   [facility] Larae Grooms, NP   5 months ago Dysuria   [facility] [place], Megan P, DO   7 months ago Urinary tract infection with hematuria, site unspecified   [facility] Mecum, Oswaldo Conroy, PA-C   1 year ago Routine general medical examination at a health care facility   [facility] [place], [place] P, DO   1 year ago Pain with urination    [facility] Mecum, Oswaldo Conroy, PA-C

## 2023-04-13 DIAGNOSIS — M533 Sacrococcygeal disorders, not elsewhere classified: Secondary | ICD-10-CM | POA: Diagnosis not present

## 2023-04-27 ENCOUNTER — Other Ambulatory Visit: Payer: Self-pay | Admitting: Family Medicine

## 2023-04-27 NOTE — Telephone Encounter (Signed)
Requested Prescriptions  Pending Prescriptions Disp Refills   meloxicam (MOBIC) 7.5 MG tablet [Pharmacy Med Name: MELOXICAM TABS 7.5MG ] 90 tablet 3    Sig: TAKE 1 TABLET DAILY     Analgesics:  COX2 Inhibitors Failed - 04/27/2023  1:44 AM      Failed - Manual Review: Labs are only required if the patient has taken medication for more than 8 weeks.      Passed - HGB in normal range and within 360 days    Hemoglobin  Date Value Ref Range Status  10/27/2022 13.1 11.1 - 15.9 g/dL Final         Passed - Cr in normal range and within 360 days    Creatinine  Date Value Ref Range Status  10/24/2014 0.76 0.60 - 1.30 mg/dL Final   Creatinine, Ser  Date Value Ref Range Status  10/06/2022 0.67 0.57 - 1.00 mg/dL Final         Passed - HCT in normal range and within 360 days    Hematocrit  Date Value Ref Range Status  10/27/2022 39.9 34.0 - 46.6 % Final         Passed - AST in normal range and within 360 days    AST  Date Value Ref Range Status  10/06/2022 21 0 - 40 IU/L Final         Passed - ALT in normal range and within 360 days    ALT  Date Value Ref Range Status  10/06/2022 25 0 - 32 IU/L Final         Passed - eGFR is 30 or above and within 360 days    EGFR (African American)  Date Value Ref Range Status  10/24/2014 >60 >56mL/min Final   GFR calc Af Amer  Date Value Ref Range Status  02/28/2020 93 >59 mL/min/1.73 Final    Comment:    **[facility] currently reports eGFR in compliance with the current**   recommendations of the [employer]. [facility] will   update reporting as new guidelines are published from the NKF-ASN   Task force.    EGFR (Non-African Amer.)  Date Value Ref Range Status  10/24/2014 >60 >57mL/min Final    Comment:    eGFR values <35mL/min/1.73 m2 may be an indication of chronic kidney disease (CKD). Calculated eGFR, using the MRDR Study equation, is useful in  patients with stable renal function. The eGFR calculation will not be  reliable in acutely ill patients when serum creatinine is changing rapidly. It is not useful in patients on dialysis. The eGFR calculation may not be applicable to patients at the low and high extremes of body sizes, pregnant women, and vegetarians.    GFR calc non Af Amer  Date Value Ref Range Status  02/28/2020 80 >59 mL/min/1.73 Final   eGFR  Date Value Ref Range Status  10/06/2022 98 >59 mL/min/1.73 Final         Passed - Patient is not pregnant      Passed - Valid encounter within last 12 months    Recent Outpatient Visits           4 months ago Otalgia, unspecified laterality   [place] [facility] Larae Grooms, NP   6 months ago Dysuria   [facility] [place], Megan P, DO   8 months ago Urinary tract infection with hematuria, site unspecified   [place] [facility] Mecum, Oswaldo Conroy, PA-C   1 year ago  Routine general medical examination at a health care facility   [facility] [place], [place] P, DO   1 year ago Pain with urination   [facility] Mecum, Oswaldo Conroy, PA-C

## 2023-04-28 ENCOUNTER — Encounter: Payer: Self-pay | Admitting: Nurse Practitioner

## 2023-04-28 ENCOUNTER — Ambulatory Visit (INDEPENDENT_AMBULATORY_CARE_PROVIDER_SITE_OTHER): Payer: BC Managed Care – PPO | Admitting: Nurse Practitioner

## 2023-04-28 VITALS — BP 144/83 | HR 79 | Temp 98.8°F | Ht 65.0 in | Wt 207.0 lb

## 2023-04-28 DIAGNOSIS — R632 Polyphagia: Secondary | ICD-10-CM

## 2023-04-28 DIAGNOSIS — Z6834 Body mass index (BMI) 34.0-34.9, adult: Secondary | ICD-10-CM

## 2023-04-28 DIAGNOSIS — E669 Obesity, unspecified: Secondary | ICD-10-CM

## 2023-04-28 MED ORDER — OZEMPIC (0.25 OR 0.5 MG/DOSE) 2 MG/3ML ~~LOC~~ SOPN
0.2500 mg | PEN_INJECTOR | SUBCUTANEOUS | 0 refills | Status: DC
Start: 2023-04-28 — End: 2023-05-26

## 2023-04-28 NOTE — Progress Notes (Unsigned)
Office: [phone]  /  Fax: [phone]  WEIGHT SUMMARY AND BIOMETRICS  Weight Lost Since Last Visit: 1lb  No data recorded  Vitals Temp: 98.8 F (37.1 C) BP: (!) 144/83 Pulse Rate: 79 SpO2: 95 %   Anthropometric Measurements Height: 5\' 5"  (1.651 m) Weight: 207 lb (93.9 kg) BMI (Calculated): 34.45 Weight at Last Visit: 208lb Weight Lost Since Last Visit: 1lb Starting Weight: 218lb Total Weight Loss (lbs): 11 lb (4.99 kg)   Body Composition  Body Fat %: 43.9 % Fat Mass (lbs): 90.8 lbs Muscle Mass (lbs): 110.4 lbs Total Body Water (lbs): 74.8 lbs Visceral Fat Rating : 13   Other Clinical Data Fasting: No Labs: No Today's Visit #: 19 Starting Date: 10/24/21     HPI  Chief Complaint: OBESITY  Allyx is here to discuss her progress with her obesity treatment plan. She is on the the Category 2 Plan and states she is following her eating plan approximately 50 % of the time. She states she is exercising 0 minutes 0 days per week.   Interval History:  Since last office visit she has lost 1 pound.  She continues to struggle with right knee pain and back pain.     Pharmacotherapy for weight loss: She {srtis (Optional):29129} currently taking {srtpreviousweightlossmeds (Optional):29124} for medical weight loss.  Denies side effects.    Previous pharmacotherapy for medical weight loss:  {srtpreviousweightlossmeds (Optional):29124}  She stopped *** due to side effects of ***.  Bariatric surgery:  Patient is status post {srtweightlosssurgery:29125} by Dr.  Marland Kitchen in ***.  Her highest weight prior to surgery was *** and her nadir weight after surgery was ***.  She is taking {srtvitamins (Optional):29126}.  She reports {srtrestriction (Optional):29127} restriction.     PHYSICAL EXAM:  Blood pressure (!) 144/83, pulse 79, temperature 98.8 F (37.1 C), height 5\' 5"  (1.651 m), weight 207 lb (93.9 kg), SpO2 95 %. Body mass index is 34.45 kg/m.  General: She is  overweight, cooperative, alert, well developed, and in no acute distress. PSYCH: Has normal mood, affect and thought process.   Extremities: No edema.  Neurologic: No gross sensory or motor deficits. No tremors or fasciculations noted.    DIAGNOSTIC DATA REVIEWED:  BMET    Component Value Date/Time   NA 142 10/06/2022 1152   NA 139 10/24/2014 0708   K 4.4 10/06/2022 1152   K 3.9 10/24/2014 0708   CL 105 10/06/2022 1152   CL 106 10/24/2014 0708   CO2 21 10/06/2022 1152   CO2 27 10/24/2014 0708   GLUCOSE 86 10/06/2022 1152   GLUCOSE 97 11/12/2015 1827   GLUCOSE 108 (H) 10/24/2014 0708   BUN 20 10/06/2022 1152   BUN 21 (H) 10/24/2014 0708   CREATININE 0.67 10/06/2022 1152   CREATININE 0.76 10/24/2014 0708   CALCIUM 9.7 10/06/2022 1152   CALCIUM 8.8 10/24/2014 0708   GFRNONAA 80 02/28/2020 1532   GFRNONAA >60 10/24/2014 0708   GFRAA 93 02/28/2020 1532   GFRAA >60 10/24/2014 0708   Lab Results  Component Value Date   HGBA1C 5.4 10/06/2022   HGBA1C 5.6 08/26/2018   Lab Results  Component Value Date   INSULIN 16.2 10/06/2022   INSULIN 33.8 (H) 11/17/2018   Lab Results  Component Value Date   TSH 0.593 06/03/2022   CBC    Component Value Date/Time   WBC 8.5 10/27/2022 0809   WBC 7.5 11/12/2015 1827   RBC 4.29 10/27/2022 0809   RBC 4.44 11/12/2015 1827  HGB 13.1 10/27/2022 0809   HCT 39.9 10/27/2022 0809   PLT 270 10/27/2022 0809   MCV 93 10/27/2022 0809   MCV 94 10/24/2014 0708   MCH 30.5 10/27/2022 0809   MCH 30.9 11/12/2015 1827   MCHC 32.8 10/27/2022 0809   MCHC 33.7 11/12/2015 1827   RDW 12.0 10/27/2022 0809   RDW 13.2 10/24/2014 0708   Iron Studies No results found for: "IRON", "TIBC", "FERRITIN", "IRONPCTSAT" Lipid Panel     Component Value Date/Time   CHOL 214 (H) 10/06/2022 1152   TRIG 184 (H) 10/06/2022 1152   HDL 47 10/06/2022 1152   LDLCALC 134 (H) 10/06/2022 1152   Hepatic Function Panel     Component Value Date/Time   PROT 7.2  10/06/2022 1152   ALBUMIN 4.5 10/06/2022 1152   AST 21 10/06/2022 1152   ALT 25 10/06/2022 1152   ALKPHOS 87 10/06/2022 1152   BILITOT 0.3 10/06/2022 1152      Component Value Date/Time   TSH 0.593 06/03/2022 1557   Nutritional Lab Results  Component Value Date   VD25OH 36.9 10/06/2022   VD25OH 61.1 06/03/2022   VD25OH 30.1 03/13/2022     ASSESSMENT AND PLAN  TREATMENT PLAN FOR OBESITY:  Recommended Dietary Goals  Valesha is currently in the action stage of change. As such, her goal is to continue weight management plan. She has agreed to {MWMwtlossportion/plan2:23431}.  Behavioral Intervention  We discussed the following Behavioral Modification Strategies today: {EMWMwtlossstrategies:28914::"increasing lean protein intake","decreasing simple carbohydrates ","increasing vegetables","increasing lower glycemic fruits","increasing water intake","continue to practice mindfulness when eating","planning for success"}.  Additional resources provided today: NA  Recommended Physical Activity Goals  Kierstin has been advised to work up to 150 minutes of moderate intensity aerobic activity a week and strengthening exercises 2-3 times per week for cardiovascular health, weight loss maintenance and preservation of muscle mass.   She has agreed to {EMEXERCISE:28847::"Think about ways to increase daily physical activity and overcoming barriers to exercise"}   Pharmacotherapy We discussed various medication options to help Markee with her weight loss efforts and we both agreed to ***.  ASSOCIATED CONDITIONS ADDRESSED TODAY  Action/Plan  There are no diagnoses linked to this encounter.    Will obtain labs at next visit.     No follow-ups on file.Marland Kitchen She was informed of the importance of frequent follow up visits to maximize her success with intensive lifestyle modifications for her multiple health conditions.   ATTESTASTION STATEMENTS:  Reviewed by clinician on day of visit: allergies,  medications, problem list, medical history, surgical history, family history, social history, and previous encounter notes.   Time spent on visit including pre-visit chart review and post-visit care and charting was *** minutes.    Theodis Sato. Pedro Oldenburg FNP-C

## 2023-04-29 ENCOUNTER — Telehealth: Payer: Self-pay

## 2023-04-29 NOTE — Telephone Encounter (Signed)
PA submitted through Cover My Meds for Ozempic. Awaiting insurance determination.  Key: [id number]

## 2023-05-01 ENCOUNTER — Other Ambulatory Visit: Payer: Self-pay | Admitting: Family Medicine

## 2023-05-01 NOTE — Telephone Encounter (Signed)
Requested medication (s) are due for refill today: yes  Requested medication (s) are on the active medication list:yes  Last refill:  10/27/22 #90 1 RF   Future visit scheduled: no  Notes to clinic:  overdue lab work    Requested Prescriptions  Pending Prescriptions Disp Refills   losartan (COZAAR) 100 MG tablet [Pharmacy Med Name: LOSARTAN TABS 100MG ] 90 tablet 3    Sig: TAKE 1 TABLET DAILY     Cardiovascular:  Angiotensin Receptor Blockers Failed - 05/01/2023 12:16 AM      Failed - Cr in normal range and within 180 days    Creatinine  Date Value Ref Range Status  10/24/2014 0.76 0.60 - 1.30 mg/dL Final   Creatinine, Ser  Date Value Ref Range Status  10/06/2022 0.67 0.57 - 1.00 mg/dL Final         Failed - K in normal range and within 180 days    Potassium  Date Value Ref Range Status  10/06/2022 4.4 3.5 - 5.2 mmol/L Final  10/24/2014 3.9 3.5 - 5.1 mmol/L Final         Failed - Last BP in normal range    BP Readings from Last 1 Encounters:  04/28/23 (!) 144/83         Passed - Patient is not pregnant      Passed - Valid encounter within last 6 months    Recent Outpatient Visits           4 months ago Otalgia, unspecified laterality   [place] [facility] Larae Grooms, NP   6 months ago Dysuria   [place] [facility] [place], Megan P, DO   8 months ago Urinary tract infection with hematuria, site unspecified   [place] [facility] Mecum, Oswaldo Conroy, PA-C   1 year ago Routine general medical examination at a health care facility   [facility] [place], Megan P, DO   1 year ago Pain with urination   [place] [facility] Mecum, Oswaldo Conroy, PA-C

## 2023-05-04 NOTE — Telephone Encounter (Signed)
Attempted to call patient however her voicemail box was full, will try to call again later.

## 2023-05-04 NOTE — Telephone Encounter (Signed)
Due for physical and none scheduled.

## 2023-05-07 DIAGNOSIS — M170 Bilateral primary osteoarthritis of knee: Secondary | ICD-10-CM | POA: Diagnosis not present

## 2023-05-13 ENCOUNTER — Telehealth: Payer: Self-pay | Admitting: Family Medicine

## 2023-05-13 NOTE — Telephone Encounter (Signed)
Copied from CRM [id number]. Topic: Medicare AWV >> May 13, 2023 10:55 AM Payton Doughty wrote: Reason for CRM: LVM 05/13/23 to r/s AWV appt due to [facility] not in office on Mondays. New appt date 05/19/23 at 3pm, please confirm date change.  Verlee Rossetti; Care Guide Ambulatory Clinical Support [place] [facility] Direct Dial: [phone]

## 2023-05-26 ENCOUNTER — Ambulatory Visit (INDEPENDENT_AMBULATORY_CARE_PROVIDER_SITE_OTHER): Payer: BC Managed Care – PPO | Admitting: Emergency Medicine

## 2023-05-26 VITALS — Ht 64.0 in | Wt 207.0 lb

## 2023-05-26 DIAGNOSIS — Z0000 Encounter for general adult medical examination without abnormal findings: Secondary | ICD-10-CM | POA: Diagnosis not present

## 2023-05-26 DIAGNOSIS — Z1231 Encounter for screening mammogram for malignant neoplasm of breast: Secondary | ICD-10-CM

## 2023-05-26 NOTE — Patient Instructions (Addendum)
Emily [name] , Thank you for taking time to come for your Medicare Wellness Visit. I appreciate your ongoing commitment to your health goals. Please review the following plan we discussed and let me know if I can assist you in the future.   Referrals/Orders/Follow-Ups/Clinician Recommendations: I placed an order for your Mammogram. Discuss with your doctor the need for another pneumonia vaccine. Get the flu shot in the fall.  This is a list of the screening recommended for you and due dates:  Health Maintenance  Topic Date Due   COVID-19 Vaccine (1 - 2023-24 season) Never done   Flu Shot  05/21/2023   Mammogram  12/07/2023   Medicare Annual Wellness Visit  05/25/2024   DEXA scan (bone density measurement)  07/09/2024   Pap Smear  03/04/2026   DTaP/Tdap/Td vaccine (3 - Td or Tdap) 02/27/2030   Colon Cancer Screening  05/31/2031   Hepatitis C Screening  Completed   HIV Screening  Completed   Zoster (Shingles) Vaccine  Completed   HPV Vaccine  Aged Out    Advanced directives: Information on Advanced Care Planning can be found at [facility] Advance Health Care Directives Advance Health Care Directives ([email or web address]    Next Medicare Annual Wellness Visit scheduled for next year: Yes, 05/31/24 @ 12:45pm  Preventive Care 65 Years and Older, Female Preventive care refers to lifestyle choices and visits with your health care provider that can promote health and wellness. What does preventive care include? A yearly physical exam. This is also called an annual well check. Dental exams once or twice a year. Routine eye exams. Ask your health care provider how often you should have your eyes checked. Personal lifestyle choices, including: Daily care of your teeth and gums. Regular physical activity. Eating a healthy diet. Avoiding tobacco and drug use. Limiting alcohol use. Practicing safe sex. Taking low-dose aspirin every day. Taking vitamin and mineral supplements as  recommended by your health care provider. What happens during an annual well check? The services and screenings done by your health care provider during your annual well check will depend on your age, overall health, lifestyle risk factors, and family history of disease. Counseling  Your health care provider may ask you questions about your: Alcohol use. Tobacco use. Drug use. Emotional well-being. Home and relationship well-being. Sexual activity. Eating habits. History of falls. Memory and ability to understand (cognition). Work and work Astronomer. Reproductive health. Screening  You may have the following tests or measurements: Height, weight, and BMI. Blood pressure. Lipid and cholesterol levels. These may be checked every 5 years, or more frequently if you are over 73 years old. Skin check. Lung cancer screening. You may have this screening every year starting at age 41 if you have a 30-pack-year history of smoking and currently smoke or have quit within the past 15 years. Fecal occult blood test (FOBT) of the stool. You may have this test every year starting at age 16. Flexible sigmoidoscopy or colonoscopy. You may have a sigmoidoscopy every 5 years or a colonoscopy every 10 years starting at age 60. Hepatitis C blood test. Hepatitis B blood test. Sexually transmitted disease (STD) testing. Diabetes screening. This is done by checking your blood sugar (glucose) after you have not eaten for a while (fasting). You may have this done every 1-3 years. Bone density scan. This is done to screen for osteoporosis. You may have this done starting at age 70. Mammogram. This may be done every 1-2 years. Talk  to your health care provider about how often you should have regular mammograms. Talk with your health care provider about your test results, treatment options, and if necessary, the need for more tests. Vaccines  Your health care provider may recommend certain vaccines, such  as: Influenza vaccine. This is recommended every year. Tetanus, diphtheria, and acellular pertussis (Tdap, Td) vaccine. You may need a Td booster every 10 years. Zoster vaccine. You may need this after age 46. Pneumococcal 13-valent conjugate (PCV13) vaccine. One dose is recommended after age 37. Pneumococcal polysaccharide (PPSV23) vaccine. One dose is recommended after age 75. Talk to your health care provider about which screenings and vaccines you need and how often you need them. This information is not intended to replace advice given to you by your health care provider. Make sure you discuss any questions you have with your health care provider. Document Released: 11/02/2015 Document Revised: 06/25/2016 Document Reviewed: 08/07/2015 Elsevier Interactive Patient Education  2017 [employer].  Fall Prevention in the Home Falls can cause injuries. They can happen to people of all ages. There are many things you can do to make your home safe and to help prevent falls. What can I do on the outside of my home? Regularly fix the edges of walkways and driveways and fix any cracks. Remove anything that might make you trip as you walk through a door, such as a raised step or threshold. Trim any bushes or trees on the path to your home. Use bright outdoor lighting. Clear any walking paths of anything that might make someone trip, such as rocks or tools. Regularly check to see if handrails are loose or broken. Make sure that both sides of any steps have handrails. Any raised decks and porches should have guardrails on the edges. Have any leaves, snow, or ice cleared regularly. Use sand or salt on walking paths during winter. Clean up any spills in your garage right away. This includes oil or grease spills. What can I do in the bathroom? Use night lights. Install grab bars by the toilet and in the tub and shower. Do not use towel bars as grab bars. Use non-skid mats or decals in the tub or  shower. If you need to sit down in the shower, use a plastic, non-slip stool. Keep the floor dry. Clean up any water that spills on the floor as soon as it happens. Remove soap buildup in the tub or shower regularly. Attach bath mats securely with double-sided non-slip rug tape. Do not have throw rugs and other things on the floor that can make you trip. What can I do in the bedroom? Use night lights. Make sure that you have a light by your bed that is easy to reach. Do not use any sheets or blankets that are too big for your bed. They should not hang down onto the floor. Have a firm chair that has side arms. You can use this for support while you get dressed. Do not have throw rugs and other things on the floor that can make you trip. What can I do in the kitchen? Clean up any spills right away. Avoid walking on wet floors. Keep items that you use a lot in easy-to-reach places. If you need to reach something above you, use a strong step stool that has a grab bar. Keep electrical cords out of the way. Do not use floor polish or wax that makes floors slippery. If you must use wax, use non-skid floor wax. Do  not have throw rugs and other things on the floor that can make you trip. What can I do with my stairs? Do not leave any items on the stairs. Make sure that there are handrails on both sides of the stairs and use them. Fix handrails that are broken or loose. Make sure that handrails are as long as the stairways. Check any carpeting to make sure that it is firmly attached to the stairs. Fix any carpet that is loose or worn. Avoid having throw rugs at the top or bottom of the stairs. If you do have throw rugs, attach them to the floor with carpet tape. Make sure that you have a light switch at the top of the stairs and the bottom of the stairs. If you do not have them, ask someone to add them for you. What else can I do to help prevent falls? Wear shoes that: Do not have high heels. Have  rubber bottoms. Are comfortable and fit you well. Are closed at the toe. Do not wear sandals. If you use a stepladder: Make sure that it is fully opened. Do not climb a closed stepladder. Make sure that both sides of the stepladder are locked into place. Ask someone to hold it for you, if possible. Clearly mark and make sure that you can see: Any grab bars or handrails. First and last steps. Where the edge of each step is. Use tools that help you move around (mobility aids) if they are needed. These include: Canes. Walkers. Scooters. Crutches. Turn on the lights when you go into a dark area. Replace any light bulbs as soon as they burn out. Set up your furniture so you have a clear path. Avoid moving your furniture around. If any of your floors are uneven, fix them. If there are any pets around you, be aware of where they are. Review your medicines with your doctor. Some medicines can make you feel dizzy. This can increase your chance of falling. Ask your doctor what other things that you can do to help prevent falls. This information is not intended to replace advice given to you by your health care provider. Make sure you discuss any questions you have with your health care provider. Document Released: 08/02/2009 Document Revised: 03/13/2016 Document Reviewed: 11/10/2014 Elsevier Interactive Patient Education  2017 [employer].

## 2023-05-26 NOTE — Progress Notes (Signed)
Subjective:   Emily [name] is a 62 y.o. female who presents for Medicare Annual (Subsequent) preventive examination.  Visit Complete: Virtual  I connected with  Emily [name] on 05/26/23 by a audio enabled telemedicine application and verified that I am speaking with the correct person using two identifiers.  Patient Location: Home  Provider Location: Home Office  I discussed the limitations of evaluation and management by telemedicine. The patient expressed understanding and agreed to proceed.  Patient Medicare AWV questionnaire was completed by the patient on 05/26/23; I have confirmed that all information answered by patient is correct and no changes since this date.  Vital Signs: Unable to obtain new vitals due to this being a telehealth visit.   Review of Systems     Cardiac Risk Factors include: obesity (BMI >30kg/m2);hypertension;dyslipidemia;Other (see comment), Risk factor comments: pre-diabetes     Objective:    Today's Vitals   05/26/23 1200 05/26/23 1245  Weight:  207 lb (93.9 kg)  Height:  5\' 4"  (1.626 m)  PainSc: 3     Body mass index is 35.53 kg/m.     05/26/2023   12:57 PM 04/21/2022   11:54 AM 05/30/2021    7:02 AM 11/14/2015    8:58 AM 09/07/2015    8:35 AM 03/03/2014    8:34 AM  Advanced Directives  Does Patient Have a Medical Advance Directive? No No No No No Patient would not like information  Would patient like information on creating a medical advance directive? Yes (MAU/Ambulatory/Procedural Areas - Information given) No - Patient declined  Yes - Educational materials given No - patient declined information     Current Medications (verified) Outpatient Encounter Medications as of 05/26/2023  Medication Sig   hydrochlorothiazide (HYDRODIURIL) 25 MG tablet Take 1 tablet (25 mg total) by mouth daily.   levothyroxine (SYNTHROID) 137 MCG tablet TAKE 1 TABLET DAILY BEFORE BREAKFAST   losartan (COZAAR) 100 MG tablet TAKE 1 TABLET DAILY   meloxicam (MOBIC)  7.5 MG tablet TAKE 1 TABLET DAILY   triamcinolone ointment (KENALOG) 0.5 % Apply 1 Application topically 2 (two) times daily.   venlafaxine XR (EFFEXOR-XR) 150 MG 24 hr capsule Take 1 capsule (150 mg total) by mouth daily with breakfast.   Vitamin D, Ergocalciferol, (DRISDOL) 1.25 MG (50000 UNIT) CAPS capsule Take one pill po every 10 days   Semaglutide,0.25 or 0.5MG /DOS, (OZEMPIC, 0.25 OR 0.5 MG/DOSE,) 2 MG/3ML SOPN Inject 0.25 mg into the skin once a week.   No facility-administered encounter medications on file as of 05/26/2023.    Allergies (verified) Patient has no known allergies.   History: Past Medical History:  Diagnosis Date   Anxiety    Back pain    Chest pain    a. 10/2013 Neg ETT; b. 10/2015 Refused MV->Cath: nl cors, nl EF.   Constipation    Degenerative disc disease, lumbar    Depression    Essential hypertension    Fatigue    GERD (gastroesophageal reflux disease)    Hypothyroidism    Joint pain in fingers of both hands    Morbid obesity (HCC)    Myalgia    Neck pain    SOB (shortness of breath)    Past Surgical History:  Procedure Laterality Date   CARDIAC CATHETERIZATION N/A 11/14/2015   Procedure: Left Heart Cath and Coronary Angiography;  Surgeon: Iran Ouch, MD;  Location: [facility];  Service: Cardiovascular;  Laterality: N/A;   CARPAL TUNNEL RELEASE Left 07/03/2015  Procedure: CARPAL TUNNEL RELEASE;  Surgeon: Myra Rude, MD;  Location: [facility];  Service: Orthopedics;  Laterality: Left;   CARPAL TUNNEL RELEASE Right 09/07/2015   Procedure: CARPAL TUNNEL RELEASE;  Surgeon: Myra Rude, MD;  Location: [facility] OR[facility];  Service: Orthopedics;  Laterality: Right;   CHOLECYSTECTOMY     COLONOSCOPY WITH PROPOFOL N/A 05/30/2021   Procedure: COLONOSCOPY WITH PROPOFOL;  Surgeon: Pasty Spillers, MD;  Location: [facility];  Service: Endoscopy;  Laterality: N/A;   KNEE SURGERY Right    TUBAL LIGATION     Family History  Problem Relation  Age of Onset   Stroke Mother    Heart failure Mother    Heart disease Mother    Hypertension Mother    Thyroid disease Mother    Depression Mother    Anxiety disorder Mother    Obesity Mother    Heart attack Father 70   Hypertension Father    Hyperlipidemia Father    Alzheimer's disease Father    Heart disease Father    Obesity Father    Cancer Paternal Uncle        prostate   Diabetes Maternal Grandmother    Diabetes Paternal Grandmother    CAD Cousin 54       passed   Breast cancer Neg Hx    Social History   Socioeconomic History   Marital status: Married    Spouse name: Tinnie Gens   Number of children: Not on file   Years of education: Not on file   Highest education level: Not on file  Occupational History   Occupation: retired from [employer] 30 yrs of service  Tobacco Use   Smoking status: Former    Current packs/day: 0.00    Average packs/day: 0.3 packs/day for 10.0 years (2.5 ttl pk-yrs)    Types: Cigarettes    Start date: 07/01/1985    Quit date: 07/02/1995    Years since quitting: 27.9   Smokeless tobacco: Never  Vaping Use   Vaping status: Never Used  Substance and Sexual Activity   Alcohol use: No    Alcohol/week: 0.0 standard drinks of alcohol   Drug use: No   Sexual activity: Yes    Partners: Male  Other Topics Concern   Not on file  Social History Narrative   Married to [place], has 1 daughter and 1 son and 1 step-daughter and 1 step-son   Social Determinants of Health   Financial Resource Strain: Low Risk  (05/26/2023)   Overall Financial Resource Strain (CARDIA)    Difficulty of Paying Living Expenses: Not hard at all  Food Insecurity: No Food Insecurity (05/26/2023)   Hunger Vital Sign    Worried About Running Out of Food in the Last Year: Never true    Ran Out of Food in the Last Year: Never true  Transportation Needs: No Transportation Needs (05/26/2023)   PRAPARE - Administrator, Civil Service (Medical): No    Lack of  Transportation (Non-Medical): No  Physical Activity: Inactive (05/26/2023)   Exercise Vital Sign    Days of Exercise per Week: 0 days    Minutes of Exercise per Session: 0 min  Stress: No Stress Concern Present (05/26/2023)   [employer] of Occupational Health - Occupational Stress Questionnaire    Feeling of Stress : Only a little  Social Connections: Socially Integrated (05/26/2023)   Social Connection and Isolation Panel [NHANES]    Frequency of Communication with Friends and Family: Three times a week  Frequency of Social Gatherings with Friends and Family: Patient declined    Attends Religious Services: More than 4 times per year    Active Member of [employer] or Organizations: Yes    Attends Engineer, structural: More than 4 times per year    Marital Status: Married    Tobacco Counseling Counseling given: Not Answered   Clinical Intake:  Pre-visit preparation completed: Yes  Pain : 0-10 Pain Score: 3  Pain Type: Chronic pain Pain Location: Back Pain Descriptors / Indicators: Aching     BMI - recorded: 35.53 Nutritional Status: BMI > 30  Obese Diabetes: No (pre-diabetes)  How often do you need to have someone help you when you read instructions, pamphlets, or other written materials from your doctor or pharmacy?: 2 - Rarely  Interpreter Needed?: No  Comments: doesn't understand some things that she reads, but for the most part she is good with reading Information entered by :: Tora Kindred, CMA   Activities of Daily Living    05/26/2023   12:00 PM  In your present state of health, do you have any difficulty performing the following activities:  Hearing? 0  Vision? 0  Difficulty concentrating or making decisions? 0  Walking or climbing stairs? 0  Dressing or bathing? 0  Doing errands, shopping? 0  Preparing Food and eating ? N  Using the Toilet? N  In the past six months, have you accidently leaked urine? N  Do you have problems with loss of bowel  control? N  Managing your Medications? N  Managing your Finances? N  Housekeeping or managing your Housekeeping? N    Patient Care Team: Dorcas Carrow, DO as PCP - General (Family Medicine) Kandyce Rud., MD (Rheumatology)  Indicate any recent Medical Services you may have received from other than [facility] providers in the past year (date may be approximate).     Assessment:   This is a routine wellness examination for [place].  Hearing/Vision screen Hearing Screening - Comments:: Denies hearing loss  Dietary issues and exercise activities discussed:     Goals Addressed               This Visit's Progress     Weight (lb) < 170 lb (77.1 kg) (pt-stated)   207 lb (93.9 kg)     Depression Screen    05/26/2023   12:54 PM 10/27/2022    8:18 AM 08/13/2022   11:23 AM 04/21/2022   11:56 AM 03/13/2022   11:15 AM 02/10/2022    2:40 PM 12/10/2021    9:16 AM  PHQ 2/9 Scores  PHQ - 2 Score 1 4 1 2 2  0 0  PHQ- 9 Score 2 12 5 3 5 2 5     Fall Risk    05/26/2023   12:00 PM 10/27/2022    8:18 AM 08/13/2022   11:23 AM 04/21/2022   11:54 AM 03/13/2022   11:15 AM  Fall Risk   Falls in the past year? 0 1 0 0 0  Number falls in past yr: 0 0 0 0 0  Injury with Fall? 0 0 0 0 0  Risk for fall due to : No Fall Risks History of fall(s) No Fall Risks  No Fall Risks  Follow up Falls prevention discussed Falls evaluation completed Falls evaluation completed Falls evaluation completed;Education provided;Falls prevention discussed Falls evaluation completed    MEDICARE RISK AT HOME:  Medicare Risk at Home - 05/26/23 1200     If  so, are there any without handrails? No             TIMED UP AND GO:  Was the test performed?  No    Cognitive Function:        05/26/2023   12:58 PM 04/21/2022   11:52 AM  6CIT Screen  What Year? 0 points 0 points  What month? 0 points 0 points  What time? 0 points 0 points  Count back from 20 0 points 0 points  Months in reverse 0 points 0 points   Repeat phrase 0 points 0 points  Total Score 0 points 0 points    Immunizations Immunization History  Administered Date(s) Administered   Influenza,inj,Quad PF,6+ Mos 08/27/2015, 07/21/2017, 08/26/2018, 10/15/2021   Pneumococcal Polysaccharide-23 04/07/2016   Tdap 07/25/2009, 02/28/2020   Zoster Recombinant(Shingrix) 03/13/2022, 06/09/2022    TDAP status: Up to date  Flu Vaccine status: Due, Education has been provided regarding the importance of this vaccine. Advised may receive this vaccine at local pharmacy or Health Dept. Aware to provide a copy of the vaccination record if obtained from local pharmacy or Health Dept. Verbalized acceptance and understanding.  Pneumococcal vaccine status: Due, Education has been provided regarding the importance of this vaccine. Advised may receive this vaccine at local pharmacy or Health Dept. Aware to provide a copy of the vaccination record if obtained from local pharmacy or Health Dept. Verbalized acceptance and understanding.  Covid-19 vaccine status: Declined, Education has been provided regarding the importance of this vaccine but patient still declined. Advised may receive this vaccine at local pharmacy or Health Dept.or vaccine clinic. Aware to provide a copy of the vaccination record if obtained from local pharmacy or Health Dept. Verbalized acceptance and understanding.  Qualifies for Shingles Vaccine? Yes   Zostavax completed No   Shingrix Completed?: Yes  Screening Tests Health Maintenance  Topic Date Due   COVID-19 Vaccine (1 - 2023-24 season) Never done   INFLUENZA VACCINE  05/21/2023   MAMMOGRAM  12/07/2023   Medicare Annual Wellness (AWV)  05/25/2024   PAP SMEAR-Modifier  03/04/2026   DTaP/Tdap/Td (3 - Td or Tdap) 02/27/2030   Colonoscopy  05/31/2031   Hepatitis C Screening  Completed   HIV Screening  Completed   Zoster Vaccines- Shingrix  Completed   HPV VACCINES  Aged Out    Health Maintenance  Health Maintenance Due   Topic Date Due   COVID-19 Vaccine (1 - 2023-24 season) Never done   INFLUENZA VACCINE  05/21/2023    Colorectal cancer screening: Type of screening: Colonoscopy. Completed 05/30/21. Repeat every 10 years  Mammogram status: Completed 12/06/21. Repeat every year. Order placed.  Bone Density status: Completed 06/2022. Results reflect: Bone density results: OSTEOPENIA. Repeat every 2 years.  Lung Cancer Screening: (Low Dose CT Chest recommended if Age 19-80 years, 20 pack-year currently smoking OR have quit w/in 15years.) does not qualify.   Lung Cancer Screening Referral: n/a  Additional Screening:  Hepatitis C Screening: does not qualify; Completed 04/07/16  Vision Screening: Recommended annual ophthalmology exams for early detection of glaucoma and other disorders of the eye.  Dental Screening: Recommended annual dental exams for proper oral hygiene  Community Resource Referral / Chronic Care Management: CRR required this visit?  No   CCM required this visit?  No     Plan:     I have personally reviewed and noted the following in the patient's chart:   Medical and social history Use of alcohol, tobacco or illicit drugs  Current medications and supplements including opioid prescriptions. Patient is not currently taking opioid prescriptions. Functional ability and status Nutritional status Physical activity Advanced directives List of other physicians Hospitalizations, surgeries, and ER visits in previous 12 months Vitals Screenings to include cognitive, depression, and falls Referrals and appointments  In addition, I have reviewed and discussed with patient certain preventive protocols, quality metrics, and best practice recommendations. A written personalized care plan for preventive services as well as general preventive health recommendations were provided to patient.     Tora Kindred, CMA   05/26/2023   After Visit Summary: (MyChart) Due to this being a telephonic  visit, the after visit summary with patients personalized plan was offered to patient via MyChart   Nurse Notes:  Needs flu shot this fall Needs Prevnar 20 MMG order placed Patient declined Covid vaccines

## 2023-05-27 ENCOUNTER — Other Ambulatory Visit: Payer: Self-pay | Admitting: Family Medicine

## 2023-05-28 ENCOUNTER — Other Ambulatory Visit: Payer: Self-pay | Admitting: Family Medicine

## 2023-05-28 NOTE — Telephone Encounter (Signed)
Requested Prescriptions  Pending Prescriptions Disp Refills   venlafaxine XR (EFFEXOR-XR) 150 MG 24 hr capsule [Pharmacy Med Name: VENLAFAXINE ER 150MG  CAPSULES] 90 capsule 0    Sig: TAKE 1 CAPSULE(150 MG) BY MOUTH DAILY WITH BREAKFAST     Psychiatry: Antidepressants - SNRI - desvenlafaxine & venlafaxine Failed - 05/27/2023 12:26 PM      Failed - Last BP in normal range    BP Readings from Last 1 Encounters:  04/28/23 (!) 144/83         Failed - Lipid Panel in normal range within the last 12 months    Cholesterol, Total  Date Value Ref Range Status  10/06/2022 214 (H) 100 - 199 mg/dL Final   LDL Chol Calc (NIH)  Date Value Ref Range Status  10/06/2022 134 (H) 0 - 99 mg/dL Final   HDL  Date Value Ref Range Status  10/06/2022 47 >39 mg/dL Final   Triglycerides  Date Value Ref Range Status  10/06/2022 184 (H) 0 - 149 mg/dL Final         Passed - Cr in normal range and within 360 days    Creatinine  Date Value Ref Range Status  10/24/2014 0.76 0.60 - 1.30 mg/dL Final   Creatinine, Ser  Date Value Ref Range Status  10/06/2022 0.67 0.57 - 1.00 mg/dL Final         Passed - Completed PHQ-2 or PHQ-9 in the last 360 days      Passed - Valid encounter within last 6 months    Recent Outpatient Visits           5 months ago Otalgia, unspecified laterality   [facility] Larae Grooms, NP   7 months ago Dysuria   [facility] St[facility] Clifton, Megan P, DO   9 months ago Urinary tract infection with hematuria, site unspecified   [facility] Mecum, Oswaldo Conroy, PA-C   1 year ago Routine general medical examination at a health care facility   [facility], [place] P, DO   1 year ago Pain with urination   [facility] Mecum, Oswaldo Conroy, PA-C       Future Appointments             In 2 weeks Laural Benes, Oralia Rud, DO [facility]

## 2023-06-01 ENCOUNTER — Ambulatory Visit: Payer: PRIVATE HEALTH INSURANCE | Admitting: Nurse Practitioner

## 2023-06-08 ENCOUNTER — Encounter: Payer: Self-pay | Admitting: Nurse Practitioner

## 2023-06-08 ENCOUNTER — Ambulatory Visit (INDEPENDENT_AMBULATORY_CARE_PROVIDER_SITE_OTHER): Payer: BC Managed Care – PPO | Admitting: Nurse Practitioner

## 2023-06-08 VITALS — BP 130/75 | HR 85 | Temp 98.5°F | Ht 64.0 in | Wt 202.0 lb

## 2023-06-08 DIAGNOSIS — E669 Obesity, unspecified: Secondary | ICD-10-CM | POA: Diagnosis not present

## 2023-06-08 DIAGNOSIS — Z6834 Body mass index (BMI) 34.0-34.9, adult: Secondary | ICD-10-CM

## 2023-06-08 DIAGNOSIS — R632 Polyphagia: Secondary | ICD-10-CM | POA: Diagnosis not present

## 2023-06-08 MED ORDER — TRULICITY 0.75 MG/0.5ML ~~LOC~~ SOAJ
0.7500 mg | SUBCUTANEOUS | 0 refills | Status: DC
Start: 2023-06-08 — End: 2023-11-13

## 2023-06-08 NOTE — Patient Instructions (Signed)

## 2023-06-08 NOTE — Progress Notes (Signed)
Office: [phone]  /  Fax: [phone]  WEIGHT SUMMARY AND BIOMETRICS  Weight Lost Since Last Visit: 5lb  Weight Gained Since Last Visit: 0lb   Vitals Temp: 98.5 F (36.9 C) BP: 130/75 Pulse Rate: 85 SpO2: 97 %   Anthropometric Measurements Height: 5\' 4"  (1.626 m) Weight: 202 lb (91.6 kg) BMI (Calculated): 34.66 Weight at Last Visit: 207lb Weight Lost Since Last Visit: 5lb Weight Gained Since Last Visit: 0lb Starting Weight: 218lb Total Weight Loss (lbs): 16 lb (7.258 kg)   Body Composition  Body Fat %: 45.1 % Fat Mass (lbs): 91.4 lbs Muscle Mass (lbs): 105.8 lbs Total Body Water (lbs): 69.2 lbs Visceral Fat Rating : 13   Other Clinical Data Fasting: No Labs: No Today's Visit #: 20 Starting Date: 10/24/21     HPI  Chief Complaint: OBESITY  Emily [name] is here to discuss her progress with her obesity treatment plan. She is on the the Category 2 Plan and states she is following her eating plan approximately 80 % of the time. She states she is exercising 0 minutes 0 days per week.   Interval History:  Since last office visit she has lost 5 pounds.  She has overall done better with food choices.  She is eating 2 meals and 1-2 snacks per day.  Misses the first meal due to sleeping late.     Pharmacotherapy for weight loss: She is not currently taking medications  for medical weight loss.    Previous pharmacotherapy for medical weight loss:  none  Bariatric surgery:  Patient has not had bariatric surgery  Polyphagia She is not currently on meds.  I had prescribed her Ozempic after her last visit and she wasn't able to start due to cost.  She was taking Trulicity and I switched her to Ozempic to see if would help better with polyphagia.  Since being off Trulicity she is struggling with polyphagia and cravings.      PHYSICAL EXAM:  Blood pressure 130/75, pulse 85, temperature 98.5 F (36.9 C), height 5\' 4"  (1.626 m), weight 202 lb (91.6 kg), SpO2  97%. Body mass index is 34.67 kg/m.  General: She is overweight, cooperative, alert, well developed, and in no acute distress. PSYCH: Has normal mood, affect and thought process.   Extremities: No edema.  Neurologic: No gross sensory or motor deficits. No tremors or fasciculations noted.    DIAGNOSTIC DATA REVIEWED:  BMET    Component Value Date/Time   NA 142 10/06/2022 1152   NA 139 10/24/2014 0708   K 4.4 10/06/2022 1152   K 3.9 10/24/2014 0708   CL 105 10/06/2022 1152   CL 106 10/24/2014 0708   CO2 21 10/06/2022 1152   CO2 27 10/24/2014 0708   GLUCOSE 86 10/06/2022 1152   GLUCOSE 97 11/12/2015 1827   GLUCOSE 108 (H) 10/24/2014 0708   BUN 20 10/06/2022 1152   BUN 21 (H) 10/24/2014 0708   CREATININE 0.67 10/06/2022 1152   CREATININE 0.76 10/24/2014 0708   CALCIUM 9.7 10/06/2022 1152   CALCIUM 8.8 10/24/2014 0708   GFRNONAA 80 02/28/2020 1532   GFRNONAA >60 10/24/2014 0708   GFRAA 93 02/28/2020 1532   GFRAA >60 10/24/2014 0708   Lab Results  Component Value Date   HGBA1C 5.4 10/06/2022   HGBA1C 5.6 08/26/2018   Lab Results  Component Value Date   INSULIN 16.2 10/06/2022   INSULIN 33.8 (H) 11/17/2018   Lab Results  Component Value Date   TSH 0.593 06/03/2022  CBC    Component Value Date/Time   WBC 8.5 10/27/2022 0809   WBC 7.5 11/12/2015 1827   RBC 4.29 10/27/2022 0809   RBC 4.44 11/12/2015 1827   HGB 13.1 10/27/2022 0809   HCT 39.9 10/27/2022 0809   PLT 270 10/27/2022 0809   MCV 93 10/27/2022 0809   MCV 94 10/24/2014 0708   MCH 30.5 10/27/2022 0809   MCH 30.9 11/12/2015 1827   MCHC 32.8 10/27/2022 0809   MCHC 33.7 11/12/2015 1827   RDW 12.0 10/27/2022 0809   RDW 13.2 10/24/2014 0708   Iron Studies No results found for: "IRON", "TIBC", "FERRITIN", "IRONPCTSAT" Lipid Panel     Component Value Date/Time   CHOL 214 (H) 10/06/2022 1152   TRIG 184 (H) 10/06/2022 1152   HDL 47 10/06/2022 1152   LDLCALC 134 (H) 10/06/2022 1152   Hepatic Function  Panel     Component Value Date/Time   PROT 7.2 10/06/2022 1152   ALBUMIN 4.5 10/06/2022 1152   AST 21 10/06/2022 1152   ALT 25 10/06/2022 1152   ALKPHOS 87 10/06/2022 1152   BILITOT 0.3 10/06/2022 1152      Component Value Date/Time   TSH 0.593 06/03/2022 1557   Nutritional Lab Results  Component Value Date   VD25OH 36.9 10/06/2022   VD25OH 61.1 06/03/2022   VD25OH 30.1 03/13/2022     ASSESSMENT AND PLAN  TREATMENT PLAN FOR OBESITY:  Recommended Dietary Goals  Emily [name] is currently in the action stage of change. As such, her goal is to continue weight management plan. She has agreed to keeping a food journal and adhering to recommended goals of 1200-1300 calories and 75+ protein.  Behavioral Intervention  We discussed the following Behavioral Modification Strategies today: increasing lean protein intake, decreasing simple carbohydrates , increasing vegetables, increasing lower glycemic fruits, increasing water intake, work on meal planning and preparation, keeping healthy foods at home, continue to practice mindfulness when eating, and planning for success.  Additional resources provided today: NA  Recommended Physical Activity Goals  Emily [name] has been advised to work up to 150 minutes of moderate intensity aerobic activity a week and strengthening exercises 2-3 times per week for cardiovascular health, weight loss maintenance and preservation of muscle mass.   She has agreed to Think about ways to increase daily physical activity and overcoming barriers to exercise and Increase physical activity in their day and reduce sedentary time (increase NEAT).    ASSOCIATED CONDITIONS ADDRESSED TODAY  Action/Plan  Polyphagia -     Start Trulicity; Inject 0.75 mg into the skin once a week.  Dispense: 2 mL; Refill: 0.  Side effects discussed  Intensive lifestyle modifications are the first line treatment for this issue. We discussed several lifestyle modifications today and she will  continue to work on diet, exercise and weight loss efforts. Orders and follow up as documented in patient record.  Counseling Polyphagia is excessive hunger. Causes can include: low blood sugars, hypERthyroidism, PMS, lack of sleep, stress, insulin resistance, diabetes, certain medications, and diets that are deficient in protein and fiber.    Generalized obesity  BMI 34.0-34.9,adult       She is scheduled for CPE and labs this week.    Return in about 4 weeks (around 07/06/2023).Marland Kitchen She was informed of the importance of frequent follow up visits to maximize her success with intensive lifestyle modifications for her multiple health conditions.   ATTESTASTION STATEMENTS:  Reviewed by clinician on day of visit: allergies, medications, problem list, medical  history, surgical history, family history, social history, and previous encounter notes.     Theodis Sato. Verena Shawgo FNP-C

## 2023-06-11 ENCOUNTER — Ambulatory Visit (INDEPENDENT_AMBULATORY_CARE_PROVIDER_SITE_OTHER): Payer: BC Managed Care – PPO | Admitting: Family Medicine

## 2023-06-11 ENCOUNTER — Encounter: Payer: Self-pay | Admitting: Family Medicine

## 2023-06-11 VITALS — BP 164/90 | HR 94 | Temp 98.2°F | Ht 64.0 in | Wt 203.4 lb

## 2023-06-11 DIAGNOSIS — R42 Dizziness and giddiness: Secondary | ICD-10-CM

## 2023-06-11 DIAGNOSIS — E559 Vitamin D deficiency, unspecified: Secondary | ICD-10-CM

## 2023-06-11 DIAGNOSIS — Z0000 Encounter for general adult medical examination without abnormal findings: Secondary | ICD-10-CM

## 2023-06-11 DIAGNOSIS — E782 Mixed hyperlipidemia: Secondary | ICD-10-CM | POA: Diagnosis not present

## 2023-06-11 DIAGNOSIS — I10 Essential (primary) hypertension: Secondary | ICD-10-CM

## 2023-06-11 DIAGNOSIS — R7303 Prediabetes: Secondary | ICD-10-CM | POA: Diagnosis not present

## 2023-06-11 DIAGNOSIS — E038 Other specified hypothyroidism: Secondary | ICD-10-CM | POA: Diagnosis not present

## 2023-06-11 DIAGNOSIS — Z6834 Body mass index (BMI) 34.0-34.9, adult: Secondary | ICD-10-CM

## 2023-06-11 DIAGNOSIS — F339 Major depressive disorder, recurrent, unspecified: Secondary | ICD-10-CM

## 2023-06-11 LAB — MICROSCOPIC EXAMINATION: Bacteria, UA: NONE SEEN

## 2023-06-11 LAB — CBC
Hematocrit: 43.2 % (ref 34.0–46.6)
Hemoglobin: 15.4 g/dL (ref 11.1–15.9)
MCH: 32 pg (ref 26.6–33.0)
MCHC: 35.6 g/dL (ref 31.5–35.7)
MCV: 90 fL (ref 79–97)
Platelets: 300 10*3/uL (ref 150–450)
RBC: 4.81 x10E6/uL (ref 3.77–5.28)
RDW: 12.9 % (ref 11.7–15.4)
WBC: 8.7 10*3/uL (ref 3.4–10.8)

## 2023-06-11 LAB — URINALYSIS, ROUTINE W REFLEX MICROSCOPIC
Bilirubin, UA: NEGATIVE
Glucose, UA: NEGATIVE
Leukocytes,UA: NEGATIVE
Nitrite, UA: NEGATIVE
Specific Gravity, UA: >1.03 — ABNORMAL HIGH (ref 1.005–1.030)
Urobilinogen, Ur: 0.2 mg/dL (ref 0.2–1.0)
pH, UA: 5.5 (ref 5.0–7.5)

## 2023-06-11 LAB — BAYER DCA HB A1C WAIVED: HB A1C (BAYER DCA - WAIVED): 5.7 % — ABNORMAL HIGH (ref 4.8–5.6)

## 2023-06-11 LAB — MICROALBUMIN, URINE WAIVED
Creatinine, Urine Waived: 300 mg/dL (ref 10–300)
Microalb, Ur Waived: 150 mg/L — ABNORMAL HIGH (ref 0–19)

## 2023-06-11 NOTE — Progress Notes (Signed)
BP (!) 164/90   Pulse 94   Temp 98.2 F (36.8 C) (Oral)   Ht 5\' 4"  (1.626 m)   Wt 203 lb 6.4 oz (92.3 kg)   SpO2 95%   BMI 34.91 kg/m    Subjective:    Patient ID: Emily [name], female    DOB: 28-May-1961, 62 y.o.   MRN: [id number]  HPI: Emily [name] is a 62 y.o. female presenting on 06/11/2023 for comprehensive medical examination. Current medical complaints include:  DIZZINESS Duration: 3 days Description of symptoms: room spinning Duration of episode: constant Dizziness frequency: recurrent Triggered by rolling over in bed: no Triggered by bending over: no Aggravated by head movement: no Aggravated by exertion, coughing, loud noises: no Recent head injury: no Recent or current viral symptoms: no History of vasovagal episodes: no Nausea: yes Vomiting: no Tinnitus: no Hearing loss: no Aural fullness: no Headache: yes Photophobia/phonophobia: no Unsteady gait: yes Postural instability: no Diplopia, dysarthria, dysphagia: no Generalized weakness: no Related to exertion: no Pallor: yes Diaphoresis: yes Dyspnea: yes Chest pain: no  HYPOTHYROIDISM Thyroid control status:controlled Satisfied with current treatment? yes Medication side effects: no Medication compliance: excellent compliance Recent dose adjustment:no Fatigue: yes Cold intolerance: no Heat intolerance: no Weight gain: no Weight loss: no Constipation: no Diarrhea/loose stools: no Palpitations: no Lower extremity edema: no Anxiety/depressed mood: no  DEPRESSION Mood status: uncontrolled Satisfied with current treatment?: yes Symptom severity: mild  Duration of current treatment : chronic Side effects: no Medication compliance: excellent compliance Psychotherapy/counseling: no  Previous psychiatric medications: effexor Depressed mood: yes Anxious mood: yes Anhedonia: no Significant weight loss or gain: no Insomnia: no  Fatigue: yes Feelings of worthlessness or guilt: no Impaired  concentration/indecisiveness: no Suicidal ideations: no Hopelessness: no Crying spells: no    06/11/2023    2:23 PM 05/26/2023   12:54 PM 10/27/2022    8:18 AM 08/13/2022   11:23 AM 04/21/2022   11:56 AM  Depression screen PHQ 2/9  Decreased Interest 1 0 2 1 1   Down, Depressed, Hopeless 0 1 2 0 1  PHQ - 2 Score 1 1 4 1 2   Altered sleeping 3 0 2 2 0  Tired, decreased energy 0 1 3 1 1   Change in appetite 0 0 1 0 0  Feeling bad or failure about yourself  0 0 0 0 0  Trouble concentrating 0 0 1 1 0  Moving slowly or fidgety/restless 0 0 1 0 0  Suicidal thoughts 0 0 0 0 0  PHQ-9 Score 4 2 12 5 3   Difficult doing work/chores Not difficult at all Not difficult at all  Not difficult at all    HYPERTENSION / HYPERLIPIDEMIA Satisfied with current treatment? yes Duration of hypertension: chronic BP monitoring frequency: not checking BP medication side effects: no Past BP meds: hydrochlorothiazide, losartan Duration of hyperlipidemia: chronic Cholesterol medication side effects: no Cholesterol supplements: none Past cholesterol medications: none Medication compliance: excellent compliance Aspirin: no Recent stressors: no Recurrent headaches: no Visual changes: no Palpitations: no Dyspnea: no Chest pain: no Lower extremity edema: no Dizzy/lightheaded: yes  She currently lives with: husband Menopausal Symptoms: no  Depression Screen done today and results listed below:     06/11/2023    2:23 PM 05/26/2023   12:54 PM 10/27/2022    8:18 AM 08/13/2022   11:23 AM 04/21/2022   11:56 AM  Depression screen PHQ 2/9  Decreased Interest 1 0 2 1 1   Down, Depressed, Hopeless 0 1 2 0  1  PHQ - 2 Score 1 1 4 1 2   Altered sleeping 3 0 2 2 0  Tired, decreased energy 0 1 3 1 1   Change in appetite 0 0 1 0 0  Feeling bad or failure about yourself  0 0 0 0 0  Trouble concentrating 0 0 1 1 0  Moving slowly or fidgety/restless 0 0 1 0 0  Suicidal thoughts 0 0 0 0 0  PHQ-9 Score 4 2 12 5 3   Difficult  doing work/chores Not difficult at all Not difficult at all  Not difficult at all     Past Medical History:  Past Medical History:  Diagnosis Date   Anxiety    Back pain    Chest pain    a. 10/2013 Neg ETT; b. 10/2015 Refused MV->Cath: nl cors, nl EF.   Constipation    Degenerative disc disease, lumbar    Depression    Essential hypertension    Fatigue    GERD (gastroesophageal reflux disease)    Hypothyroidism    Joint pain in fingers of both hands    Morbid obesity (HCC)    Myalgia    Neck pain    SOB (shortness of breath)     Surgical History:  Past Surgical History:  Procedure Laterality Date   CARDIAC CATHETERIZATION N/A 11/14/2015   Procedure: Left Heart Cath and Coronary Angiography;  Surgeon: Iran Ouch, MD;  Location: [facility];  Service: Cardiovascular;  Laterality: N/A;   CARPAL TUNNEL RELEASE Left 07/03/2015   Procedure: CARPAL TUNNEL RELEASE;  Surgeon: Myra Rude, MD;  Location: [facility];  Service: Orthopedics;  Laterality: Left;   CARPAL TUNNEL RELEASE Right 09/07/2015   Procedure: CARPAL TUNNEL RELEASE;  Surgeon: Myra Rude, MD;  Location: [facility];  Service: Orthopedics;  Laterality: Right;   CHOLECYSTECTOMY     COLONOSCOPY WITH PROPOFOL N/A 05/30/2021   Procedure: COLONOSCOPY WITH PROPOFOL;  Surgeon: Pasty Spillers, MD;  Location: [facility];  Service: Endoscopy;  Laterality: N/A;   KNEE SURGERY Right    TUBAL LIGATION      Medications:  Current Outpatient Medications on File Prior to Visit  Medication Sig   Dulaglutide (TRULICITY) 0.75 MG/0.5ML SOPN Inject 0.75 mg into the skin once a week.   hydrochlorothiazide (HYDRODIURIL) 25 MG tablet Take 1 tablet (25 mg total) by mouth daily.   levothyroxine (SYNTHROID) 137 MCG tablet TAKE 1 TABLET DAILY BEFORE BREAKFAST   losartan (COZAAR) 100 MG tablet TAKE 1 TABLET DAILY   meloxicam (MOBIC) 7.5 MG tablet TAKE 1 TABLET DAILY   triamcinolone ointment (KENALOG) 0.5 % Apply 1  Application topically 2 (two) times daily.   venlafaxine XR (EFFEXOR-XR) 150 MG 24 hr capsule TAKE 1 CAPSULE(150 MG) BY MOUTH DAILY WITH BREAKFAST   Vitamin D, Ergocalciferol, (DRISDOL) 1.25 MG (50000 UNIT) CAPS capsule Take one pill po every 10 days   No current facility-administered medications on file prior to visit.    Allergies:  No Known Allergies  Social History:  Social History   Socioeconomic History   Marital status: Married    Spouse name: Tinnie Gens   Number of children: Not on file   Years of education: Not on file   Highest education level: Not on file  Occupational History   Occupation: retired from [employer] 30 yrs of service  Tobacco Use   Smoking status: Former    Current packs/day: 0.00    Average packs/day: 0.3 packs/day for 10.0 years (2.5 ttl  pk-yrs)    Types: Cigarettes    Start date: 07/01/1985    Quit date: 07/02/1995    Years since quitting: 27.9   Smokeless tobacco: Never  Vaping Use   Vaping status: Never Used  Substance and Sexual Activity   Alcohol use: No    Alcohol/week: 0.0 standard drinks of alcohol   Drug use: No   Sexual activity: Yes    Partners: Male  Other Topics Concern   Not on file  Social History Narrative   Married to [place], has 1 daughter and 1 son and 1 step-daughter and 1 step-son   Social Determinants of Health   Financial Resource Strain: Low Risk  (05/26/2023)   Overall Financial Resource Strain (CARDIA)    Difficulty of Paying Living Expenses: Not hard at all  Food Insecurity: No Food Insecurity (05/26/2023)   Hunger Vital Sign    Worried About Running Out of Food in the Last Year: Never true    Ran Out of Food in the Last Year: Never true  Transportation Needs: No Transportation Needs (05/26/2023)   PRAPARE - Administrator, Civil Service (Medical): No    Lack of Transportation (Non-Medical): No  Physical Activity: Inactive (05/26/2023)   Exercise Vital Sign    Days of Exercise per Week: 0 days     Minutes of Exercise per Session: 0 min  Stress: No Stress Concern Present (05/26/2023)   [employer] of Occupational Health - Occupational Stress Questionnaire    Feeling of Stress : Only a little  Social Connections: Socially Integrated (05/26/2023)   Social Connection and Isolation Panel [NHANES]    Frequency of Communication with Friends and Family: Three times a week    Frequency of Social Gatherings with Friends and Family: Patient declined    Attends Religious Services: More than 4 times per year    Active Member of [employer] or Organizations: Yes    Attends Engineer, structural: More than 4 times per year    Marital Status: Married  Catering manager Violence: Not At Risk (05/26/2023)   Humiliation, Afraid, Rape, and Kick questionnaire    Fear of Current or Ex-Partner: No    Emotionally Abused: No    Physically Abused: No    Sexually Abused: No   Social History   Tobacco Use  Smoking Status Former   Current packs/day: 0.00   Average packs/day: 0.3 packs/day for 10.0 years (2.5 ttl pk-yrs)   Types: Cigarettes   Start date: 07/01/1985   Quit date: 07/02/1995   Years since quitting: 27.9  Smokeless Tobacco Never   Social History   Substance and Sexual Activity  Alcohol Use No   Alcohol/week: 0.0 standard drinks of alcohol    Family History:  Family History  Problem Relation Age of Onset   Stroke Mother    Heart failure Mother    Heart disease Mother    Hypertension Mother    Thyroid disease Mother    Depression Mother    Anxiety disorder Mother    Obesity Mother    Heart attack Father 61   Hypertension Father    Hyperlipidemia Father    Alzheimer's disease Father    Heart disease Father    Obesity Father    Cancer Paternal Uncle        prostate   Diabetes Maternal Grandmother    Diabetes Paternal Grandmother    CAD Cousin 22       passed   Breast cancer Neg Hx  Past medical history, surgical history, medications, allergies, family history and  social history reviewed with patient today and changes made to appropriate areas of the chart.   Review of Systems  Constitutional:  Positive for malaise/fatigue. Negative for chills, diaphoresis, fever and weight loss.  HENT: Negative.    Eyes: Negative.   Respiratory: Negative.    Cardiovascular: Negative.   Gastrointestinal:  Positive for diarrhea and nausea. Negative for abdominal pain, blood in stool, constipation, heartburn, melena and vomiting.  Genitourinary: Negative.   Musculoskeletal: Negative.   Skin: Negative.   Neurological:  Positive for dizziness and headaches. Negative for tingling, tremors, sensory change, speech change, focal weakness, seizures, loss of consciousness and weakness.  Endo/Heme/Allergies: Negative.   Psychiatric/Behavioral: Negative.     All other ROS negative except what is listed above and in the HPI.      Objective:    BP (!) 164/90   Pulse 94   Temp 98.2 F (36.8 C) (Oral)   Ht 5\' 4"  (1.626 m)   Wt 203 lb 6.4 oz (92.3 kg)   SpO2 95%   BMI 34.91 kg/m   Wt Readings from Last 3 Encounters:  06/11/23 203 lb 6.4 oz (92.3 kg)  06/08/23 202 lb (91.6 kg)  05/26/23 207 lb (93.9 kg)    Physical Exam Vitals and nursing note reviewed.  Constitutional:      General: She is not in acute distress.    Appearance: Normal appearance. She is obese. She is not ill-appearing, toxic-appearing or diaphoretic.  HENT:     Head: Normocephalic and atraumatic.     Right Ear: Tympanic membrane, ear canal and external ear normal. There is no impacted cerumen.     Left Ear: Tympanic membrane, ear canal and external ear normal. There is no impacted cerumen.     Nose: Nose normal. No congestion or rhinorrhea.     Mouth/Throat:     Mouth: Mucous membranes are moist.     Pharynx: Oropharynx is clear. No oropharyngeal exudate or posterior oropharyngeal erythema.  Eyes:     General: No scleral icterus.       Right eye: No discharge.        Left eye: No discharge.      Extraocular Movements: Extraocular movements intact.     Conjunctiva/sclera: Conjunctivae normal.     Pupils: Pupils are equal, round, and reactive to light.  Neck:     Vascular: No carotid bruit.  Cardiovascular:     Rate and Rhythm: Normal rate and regular rhythm.     Pulses: Normal pulses.     Heart sounds: No murmur heard.    No friction rub. No gallop.  Pulmonary:     Effort: Pulmonary effort is normal. No respiratory distress.     Breath sounds: Normal breath sounds. No stridor. No wheezing, rhonchi or rales.  Chest:     Chest wall: No tenderness.  Abdominal:     General: Abdomen is flat. Bowel sounds are normal. There is no distension.     Palpations: Abdomen is soft. There is no mass.     Tenderness: There is no abdominal tenderness. There is no right CVA tenderness, left CVA tenderness, guarding or rebound.     Hernia: No hernia is present.  Genitourinary:    Comments: Breast and pelvic exams deferred with shared decision making Musculoskeletal:        General: No swelling, tenderness, deformity or signs of injury.     Cervical back: Normal range of motion  and neck supple. No rigidity. No muscular tenderness.     Right lower leg: No edema.     Left lower leg: No edema.  Lymphadenopathy:     Cervical: No cervical adenopathy.  Skin:    General: Skin is warm and dry.     Capillary Refill: Capillary refill takes less than 2 seconds.     Coloration: Skin is not jaundiced or pale.     Findings: No bruising, erythema, lesion or rash.  Neurological:     General: No focal deficit present.     Mental Status: She is alert and oriented to person, place, and time. Mental status is at baseline.     Cranial Nerves: No cranial nerve deficit.     Sensory: No sensory deficit.     Motor: No weakness.     Coordination: Coordination normal.     Gait: Gait normal.     Deep Tendon Reflexes: Reflexes normal.  Psychiatric:        Mood and Affect: Mood normal.        Behavior: Behavior  normal.        Thought Content: Thought content normal.        Judgment: Judgment normal.     Results for orders placed or performed in visit on 06/11/23  Novel Coronavirus, NAA ([facility])   Specimen: Nasopharyngeal(NP) swabs in vial transport medium  Result Value Ref Range   SARS-CoV-2, NAA Not Detected Not Detected  Microscopic Examination   Urine  Result Value Ref Range   WBC, UA 0-5 0 - 5 /hpf   RBC, Urine 0-2 0 - 2 /hpf   Epithelial Cells (non renal) 0-10 0 - 10 /hpf   Casts Present (A) None seen /lpf   Cast Type Hyaline casts N/A   Mucus, UA Present (A) Not Estab.   Bacteria, UA None seen None seen/Few  CBC with Differential/Platelet  Result Value Ref Range   WBC 8.1 3.4 - 10.8 x10E3/uL   RBC 4.82 3.77 - 5.28 x10E6/uL   Hemoglobin 15.0 11.1 - 15.9 g/dL   Hematocrit 84.6 96.2 - 46.6 %   MCV 93 79 - 97 fL   MCH 31.1 26.6 - 33.0 pg   MCHC 33.6 31.5 - 35.7 g/dL   RDW 95.2 84.1 - 32.4 %   Platelets 299 150 - 450 x10E3/uL   Neutrophils 54 Not Estab. %   Lymphs 35 Not Estab. %   Monocytes 8 Not Estab. %   Eos 2 Not Estab. %   Basos 1 Not Estab. %   Neutrophils Absolute 4.4 1.4 - 7.0 x10E3/uL   Lymphocytes Absolute 2.9 0.7 - 3.1 x10E3/uL   Monocytes Absolute 0.6 0.1 - 0.9 x10E3/uL   EOS (ABSOLUTE) 0.1 0.0 - 0.4 x10E3/uL   Basophils Absolute 0.1 0.0 - 0.2 x10E3/uL   Immature Granulocytes 0 Not Estab. %   Immature Grans (Abs) 0.0 0.0 - 0.1 x10E3/uL  Comprehensive metabolic panel  Result Value Ref Range   Glucose 122 (H) 70 - 99 mg/dL   BUN 23 8 - 27 mg/dL   Creatinine, Ser 4.01 0.57 - 1.00 mg/dL   eGFR 83 >02 VO/ZDG/6.44   BUN/Creatinine Ratio 29 (H) 12 - 28   Sodium 139 134 - 144 mmol/L   Potassium 3.6 3.5 - 5.2 mmol/L   Chloride 101 96 - 106 mmol/L   CO2 22 20 - 29 mmol/L   Calcium 10.4 (H) 8.7 - 10.3 mg/dL   Total Protein 7.5 6.0 - 8.5 g/dL  Albumin 4.6 3.9 - 4.9 g/dL   Globulin, Total 2.9 1.5 - 4.5 g/dL   Bilirubin Total 0.4 0.0 - 1.2 mg/dL   Alkaline  Phosphatase 87 44 - 121 IU/L   AST 24 0 - 40 IU/L   ALT 38 (H) 0 - 32 IU/L  Lipid Panel w/o Chol/HDL Ratio  Result Value Ref Range   Cholesterol, Total 207 (H) 100 - 199 mg/dL   Triglycerides 161 0 - 149 mg/dL   HDL 46 >09 mg/dL   VLDL Cholesterol Cal 26 5 - 40 mg/dL   LDL Chol Calc (NIH) 604 (H) 0 - 99 mg/dL  Urinalysis, Routine w reflex microscopic  Result Value Ref Range   Specific Gravity, UA >1.030 (H) 1.005 - 1.030   pH, UA 5.5 5.0 - 7.5   Color, UA Yellow Yellow   Appearance Ur Clear Clear   Leukocytes,UA Negative Negative   Protein,UA 1+ (A) Negative/Trace   Glucose, UA Negative Negative   Ketones, UA Trace (A) Negative   RBC, UA Trace (A) Negative   Bilirubin, UA Negative Negative   Urobilinogen, Ur 0.2 0.2 - 1.0 mg/dL   Nitrite, UA Negative Negative   Microscopic Examination See below:   TSH  Result Value Ref Range   TSH 0.158 (L) 0.450 - 4.500 uIU/mL  Microalbumin, Urine Waived  Result Value Ref Range   Microalb, Ur Waived 150 (H) 0 - 19 mg/L   Creatinine, Urine Waived 300 10 - 300 mg/dL   Microalb/Creat Ratio 30-300 (H) <30 mg/g  Bayer DCA Hb A1c Waived  Result Value Ref Range   HB A1C (BAYER DCA - WAIVED) 5.7 (H) 4.8 - 5.6 %  VITAMIN D 25 Hydroxy (Vit-D Deficiency, Fractures)  Result Value Ref Range   Vit D, 25-Hydroxy 47.6 30.0 - 100.0 ng/mL  CBC  Result Value Ref Range   WBC 8.7 3.4 - 10.8 x10E3/uL   RBC 4.81 3.77 - 5.28 x10E6/uL   Hemoglobin 15.4 11.1 - 15.9 g/dL   Hematocrit 54.0 98.1 - 46.6 %   MCV 90 79 - 97 fL   MCH 32.0 26.6 - 33.0 pg   MCHC 35.6 31.5 - 35.7 g/dL   RDW 19.1 47.8 - 29.5 %   Platelets 300 150 - 450 x10E3/uL      Assessment & Plan:   Problem List Items Addressed This Visit       Cardiovascular and Mediastinum   Essential hypertension    Running high- possibly due to vertigo. Will get vertigo under better control and recheck in about 2 weeks. Call with any concerns.       Relevant Orders   CBC with Differential/Platelet  (Completed)   Comprehensive metabolic panel (Completed)   Microalbumin, Urine Waived (Completed)     Endocrine   Hypothyroidism    Rechecking labs today. Await results. Treat as needed.       Relevant Orders   CBC with Differential/Platelet (Completed)   Comprehensive metabolic panel (Completed)   TSH (Completed)     Other   Depression, recurrent (HCC)    Under good control on current regimen. Continue current regimen. Continue to monitor. Call with any concerns. Refills given. Labs drawn today.        Relevant Orders   CBC with Differential/Platelet (Completed)   Comprehensive metabolic panel (Completed)   BMI 34.0-34.9,adult    Continue to follow with healthy weight and wellness. Continue to monitor.       Vitamin D deficiency    Rechecking labs  today. Await results. Treat as needed.       Relevant Orders   CBC with Differential/Platelet (Completed)   Comprehensive metabolic panel (Completed)   VITAMIN D 25 Hydroxy (Vit-D Deficiency, Fractures) (Completed)   Pre-diabetes    Rechecking labs today. Await results. Treat as needed.       Relevant Orders   CBC with Differential/Platelet (Completed)   Comprehensive metabolic panel (Completed)   Urinalysis, Routine w reflex microscopic (Completed)   Bayer DCA Hb A1c Waived (Completed)   Hyperlipidemia    Rechecking labs today. Await results. Treat as needed.       Relevant Orders   CBC with Differential/Platelet (Completed)   Comprehensive metabolic panel (Completed)   Lipid Panel w/o Chol/HDL Ratio (Completed)   Other Visit Diagnoses     Vertigo    -  Primary   Will start eply's manuver. Will start on meclizine and zofran. Call if not significantly better by Monday and we'll refer to PT.   Dizziness       See discussion under vertigo.   Relevant Orders   CBC (Completed)   EKG 12-Lead (Completed)   Novel Coronavirus, NAA ([facility]) (Completed)        Follow up plan: Return in about 3 weeks (around  07/02/2023) for ok to double book virtual if needed.   LABORATORY TESTING:  - Pap smear: not applicable  IMMUNIZATIONS:   - Tdap: Tetanus vaccination status reviewed: last tetanus booster within 10 years. - Influenza: Postponed to flu season - Pneumovax: Up to date - Prevnar: Up to date - COVID: Refused - HPV: Not applicable - Shingrix vaccine: Up to date  SCREENING: -Mammogram: Ordered today  - Colonoscopy: Up to date  - Bone Density: Up to date   PATIENT COUNSELING:   Advised to take 1 mg of folate supplement per day if capable of pregnancy.   Sexuality: Discussed sexually transmitted diseases, partner selection, use of condoms, avoidance of unintended pregnancy  and contraceptive alternatives.   Advised to avoid cigarette smoking.  I discussed with the patient that most people either abstain from alcohol or drink within safe limits (<=14/week and <=4 drinks/occasion for males, <=7/weeks and <= 3 drinks/occasion for females) and that the risk for alcohol disorders and other health effects rises proportionally with the number of drinks per week and how often a drinker exceeds daily limits.  Discussed cessation/primary prevention of drug use and availability of treatment for abuse.   Diet: Encouraged to adjust caloric intake to maintain  or achieve ideal body weight, to reduce intake of dietary saturated fat and total fat, to limit sodium intake by avoiding high sodium foods and not adding table salt, and to maintain adequate dietary potassium and calcium preferably from fresh fruits, vegetables, and low-fat dairy products.    stressed the importance of regular exercise  Injury prevention: Discussed safety belts, safety helmets, smoke detector, smoking near bedding or upholstery.   Dental health: Discussed importance of regular tooth brushing, flossing, and dental visits.    NEXT PREVENTATIVE PHYSICAL DUE IN 1 YEAR. Return in about 3 weeks (around 07/02/2023) for ok to double  book virtual if needed.

## 2023-06-11 NOTE — Progress Notes (Signed)
Interpreted by me today. Poor baseline. NSR at 96bpm.

## 2023-06-12 ENCOUNTER — Ambulatory Visit: Payer: Self-pay | Admitting: *Deleted

## 2023-06-12 ENCOUNTER — Encounter: Payer: Self-pay | Admitting: Family Medicine

## 2023-06-12 LAB — CBC WITH DIFFERENTIAL/PLATELET
Basophils Absolute: 0.1 10*3/uL (ref 0.0–0.2)
Basos: 1 %
EOS (ABSOLUTE): 0.1 10*3/uL (ref 0.0–0.4)
Eos: 2 %
Hematocrit: 44.7 % (ref 34.0–46.6)
Hemoglobin: 15 g/dL (ref 11.1–15.9)
Immature Grans (Abs): 0 10*3/uL (ref 0.0–0.1)
Immature Granulocytes: 0 %
Lymphocytes Absolute: 2.9 10*3/uL (ref 0.7–3.1)
Lymphs: 35 %
MCH: 31.1 pg (ref 26.6–33.0)
MCHC: 33.6 g/dL (ref 31.5–35.7)
MCV: 93 fL (ref 79–97)
Monocytes Absolute: 0.6 10*3/uL (ref 0.1–0.9)
Monocytes: 8 %
Neutrophils Absolute: 4.4 10*3/uL (ref 1.4–7.0)
Neutrophils: 54 %
Platelets: 299 10*3/uL (ref 150–450)
RBC: 4.82 x10E6/uL (ref 3.77–5.28)
RDW: 12.3 % (ref 11.7–15.4)
WBC: 8.1 10*3/uL (ref 3.4–10.8)

## 2023-06-12 LAB — COMPREHENSIVE METABOLIC PANEL
ALT: 38 IU/L — ABNORMAL HIGH (ref 0–32)
AST: 24 IU/L (ref 0–40)
Albumin: 4.6 g/dL (ref 3.9–4.9)
Alkaline Phosphatase: 87 IU/L (ref 44–121)
BUN/Creatinine Ratio: 29 — ABNORMAL HIGH (ref 12–28)
BUN: 23 mg/dL (ref 8–27)
Bilirubin Total: 0.4 mg/dL (ref 0.0–1.2)
CO2: 22 mmol/L (ref 20–29)
Calcium: 10.4 mg/dL — ABNORMAL HIGH (ref 8.7–10.3)
Chloride: 101 mmol/L (ref 96–106)
Creatinine, Ser: 0.8 mg/dL (ref 0.57–1.00)
Globulin, Total: 2.9 g/dL (ref 1.5–4.5)
Glucose: 122 mg/dL — ABNORMAL HIGH (ref 70–99)
Potassium: 3.6 mmol/L (ref 3.5–5.2)
Sodium: 139 mmol/L (ref 134–144)
Total Protein: 7.5 g/dL (ref 6.0–8.5)
eGFR: 83 mL/min/{1.73_m2} (ref >59)

## 2023-06-12 LAB — TSH: TSH: 0.158 u[IU]/mL — ABNORMAL LOW (ref 0.450–4.500)

## 2023-06-12 LAB — LIPID PANEL W/O CHOL/HDL RATIO
Cholesterol, Total: 207 mg/dL — ABNORMAL HIGH (ref 100–199)
HDL: 46 mg/dL (ref >39)
LDL Chol Calc (NIH): 135 mg/dL — ABNORMAL HIGH (ref 0–99)
Triglycerides: 144 mg/dL (ref 0–149)
VLDL Cholesterol Cal: 26 mg/dL (ref 5–40)

## 2023-06-12 LAB — NOVEL CORONAVIRUS, NAA: SARS-CoV-2, NAA: NOT DETECTED

## 2023-06-12 LAB — VITAMIN D 25 HYDROXY (VIT D DEFICIENCY, FRACTURES): Vit D, 25-Hydroxy: 47.6 ng/mL (ref 30.0–100.0)

## 2023-06-12 NOTE — Telephone Encounter (Signed)
  Chief Complaint: medication  Rx for dizziness and nausea and covid test results Symptoms: seen in OV yesterday  Frequency: yesterday  Pertinent Negatives: Patient denies na  Disposition: [] ED /[] Urgent Care (no appt availability in office) / [] Appointment(In office/virtual)/ []  [facility]/ [] Home Care/ [] Refused Recommended Disposition /[] [facility]/ [x]  Follow-up with PCP Additional Notes:   Patient requesting if medication prescribed for nausea and dizziness as discussed in OV yesterday? None at her pharmacy. Requesting covid test results. Results still in process. Please advise.    Summary: medication request   Patient called stated she was in to see the provider on yesterday and was told she would get a medication for nausea and dizziness but the pharmacy does not have it. Please f/u with provider for medication          Reason for Disposition  Prescription request for new medicine (not a refill)  Answer Assessment - Initial Assessment Questions 1. NAME of MEDICINE: "What medicine(s) are you calling about?"     Medications for dizziness and nausea  and requesting covid results  2. QUESTION: "What is your question?" (e.g., double dose of medicine, side effect)     What medications were you going to order from OV yesterday for dizziness /nausea?  Covid results? 3. PRESCRIBER: "Who prescribed the medicine?" Reason: if prescribed by specialist, call should be referred to that group.     Na  4. SYMPTOMS: "Do you have any symptoms?" If Yes, ask: "What symptoms are you having?"  "How bad are the symptoms (e.g., mild, moderate, severe)     Dizziness  nausea  5. PREGNANCY:  "Is there any chance that you are pregnant?" "When was your last menstrual period?"     na  Protocols used: Medication Question Call-A-AH

## 2023-06-14 ENCOUNTER — Other Ambulatory Visit: Payer: Self-pay | Admitting: Family Medicine

## 2023-06-14 DIAGNOSIS — E038 Other specified hypothyroidism: Secondary | ICD-10-CM

## 2023-06-14 MED ORDER — MECLIZINE HCL 25 MG PO TABS: 25.0000 mg | ORAL_TABLET | Freq: Three times a day (TID) | ORAL | 0 refills | Status: AC | PRN

## 2023-06-14 MED ORDER — ONDANSETRON HCL 4 MG PO TABS: 4.0000 mg | ORAL_TABLET | Freq: Three times a day (TID) | ORAL | 0 refills | Status: DC | PRN

## 2023-06-14 MED ORDER — LEVOTHYROXINE SODIUM 125 MCG PO TABS
125.0000 ug | ORAL_TABLET | Freq: Every day | ORAL | 0 refills | Status: DC
Start: 2023-06-14 — End: 2023-09-02

## 2023-06-14 NOTE — Assessment & Plan Note (Signed)
Rechecking labs today. Await results. Treat as needed.  °

## 2023-06-14 NOTE — Assessment & Plan Note (Signed)
Under good control on current regimen. Continue current regimen. Continue to monitor. Call with any concerns. Refills given. Labs drawn today.   

## 2023-06-14 NOTE — Assessment & Plan Note (Signed)
Continue to follow with healthy weight and wellness. Continue to monitor.

## 2023-06-14 NOTE — Assessment & Plan Note (Signed)
Running high- possibly due to vertigo. Will get vertigo under better control and recheck in about 2 weeks. Call with any concerns.

## 2023-06-17 ENCOUNTER — Other Ambulatory Visit: Payer: Self-pay | Admitting: Family Medicine

## 2023-06-17 NOTE — Telephone Encounter (Signed)
Medications were sent in several days ago- can we please make sure she got them?

## 2023-06-17 NOTE — Telephone Encounter (Signed)
Patient states that she is picking up the medication today.

## 2023-06-17 NOTE — Progress Notes (Signed)
Called and scheduled patient Jul 23, 2023 @ 2:00 pm.

## 2023-06-18 NOTE — Telephone Encounter (Signed)
Requested Prescriptions  Pending Prescriptions Disp Refills   losartan (COZAAR) 100 MG tablet [Pharmacy Med Name: LOSARTAN TABS 100MG ] 30 tablet 0    Sig: TAKE 1 TABLET DAILY     Cardiovascular:  Angiotensin Receptor Blockers Failed - 06/17/2023  7:01 PM      Failed - Last BP in normal range    BP Readings from Last 1 Encounters:  06/11/23 (!) 164/90         Passed - Cr in normal range and within 180 days    Creatinine  Date Value Ref Range Status  10/24/2014 0.76 0.60 - 1.30 mg/dL Final   Creatinine, Ser  Date Value Ref Range Status  06/11/2023 0.80 0.57 - 1.00 mg/dL Final         Passed - K in normal range and within 180 days    Potassium  Date Value Ref Range Status  06/11/2023 3.6 3.5 - 5.2 mmol/L Final  10/24/2014 3.9 3.5 - 5.1 mmol/L Final         Passed - Patient is not pregnant      Passed - Valid encounter within last 6 months    Recent Outpatient Visits           1 week ago Vertigo   [facility] [place], Megan P, DO   6 months ago Otalgia, unspecified laterality   [facility] Larae Grooms, NP   7 months ago Dysuria   [place] [facility] [place], Megan P, DO   10 months ago Urinary tract infection with hematuria, site unspecified   [place] [facility] Mecum, Oswaldo Conroy, PA-C   1 year ago Routine general medical examination at a health care facility   [facility] Dorcas Carrow, DO       Future Appointments             In 2 weeks Laural Benes, Oralia Rud, DO [place] [facility], [facility]

## 2023-06-19 ENCOUNTER — Other Ambulatory Visit: Payer: Self-pay | Admitting: Family Medicine

## 2023-06-23 NOTE — Telephone Encounter (Signed)
Unable to refill per protocol, Rx request is too soon, last refill 05/28/23 for 90 days.  Requested Prescriptions  Pending Prescriptions Disp Refills   venlafaxine XR (EFFEXOR-XR) 150 MG 24 hr capsule [Pharmacy Med Name: VENLAFAXINE ER 150MG  CAPSULES] 90 capsule 0    Sig: TAKE 1 CAPSULE(150 MG) BY MOUTH DAILY WITH BREAKFAST     Psychiatry: Antidepressants - SNRI - desvenlafaxine & venlafaxine Failed - 06/19/2023  7:16 PM      Failed - Last BP in normal range    BP Readings from Last 1 Encounters:  06/11/23 (!) 164/90         Failed - Lipid Panel in normal range within the last 12 months    Cholesterol, Total  Date Value Ref Range Status  06/11/2023 207 (H) 100 - 199 mg/dL Final   LDL Chol Calc (NIH)  Date Value Ref Range Status  06/11/2023 135 (H) 0 - 99 mg/dL Final   HDL  Date Value Ref Range Status  06/11/2023 46 >39 mg/dL Final   Triglycerides  Date Value Ref Range Status  06/11/2023 144 0 - 149 mg/dL Final         Passed - Cr in normal range and within 360 days    Creatinine  Date Value Ref Range Status  10/24/2014 0.76 0.60 - 1.30 mg/dL Final   Creatinine, Ser  Date Value Ref Range Status  06/11/2023 0.80 0.57 - 1.00 mg/dL Final         Passed - Completed PHQ-2 or PHQ-9 in the last 360 days      Passed - Valid encounter within last 6 months    Recent Outpatient Visits           1 week ago Vertigo   [place] [facility] [place], Megan P, DO   6 months ago Otalgia, unspecified laterality   [facility] Larae Grooms, NP   7 months ago Dysuria   [place] [facility] [place], Megan P, DO   10 months ago Urinary tract infection with hematuria, site unspecified   [place] [facility] Mecum, Oswaldo Conroy, PA-C   1 year ago Routine general medical examination at a health care facility   [facility] Dorcas Carrow, DO       Future Appointments             In 1  week Dorcas Carrow, DO  [facility], [facility]

## 2023-07-01 ENCOUNTER — Other Ambulatory Visit: Payer: Self-pay | Admitting: Family Medicine

## 2023-07-02 MED ORDER — VENLAFAXINE HCL ER 150 MG PO CP24: 150.0000 mg | ORAL_CAPSULE | Freq: Every day | ORAL | 0 refills | Status: DC

## 2023-07-03 ENCOUNTER — Ambulatory Visit: Payer: Medicare PPO | Admitting: Family Medicine

## 2023-07-06 ENCOUNTER — Encounter: Payer: Self-pay | Admitting: Nurse Practitioner

## 2023-07-06 ENCOUNTER — Other Ambulatory Visit: Payer: Self-pay | Admitting: Family Medicine

## 2023-07-06 ENCOUNTER — Ambulatory Visit (INDEPENDENT_AMBULATORY_CARE_PROVIDER_SITE_OTHER): Payer: Medicare PPO | Admitting: Nurse Practitioner

## 2023-07-06 VITALS — BP 129/72 | HR 73 | Temp 98.9°F | Ht 64.0 in | Wt 205.0 lb

## 2023-07-06 DIAGNOSIS — I10 Essential (primary) hypertension: Secondary | ICD-10-CM

## 2023-07-06 DIAGNOSIS — Z6835 Body mass index (BMI) 35.0-35.9, adult: Secondary | ICD-10-CM

## 2023-07-06 DIAGNOSIS — E88819 Insulin resistance, unspecified: Secondary | ICD-10-CM

## 2023-07-06 DIAGNOSIS — R7303 Prediabetes: Secondary | ICD-10-CM

## 2023-07-06 DIAGNOSIS — E669 Obesity, unspecified: Secondary | ICD-10-CM

## 2023-07-06 DIAGNOSIS — E785 Hyperlipidemia, unspecified: Secondary | ICD-10-CM

## 2023-07-06 DIAGNOSIS — E038 Other specified hypothyroidism: Secondary | ICD-10-CM

## 2023-07-06 DIAGNOSIS — R748 Abnormal levels of other serum enzymes: Secondary | ICD-10-CM

## 2023-07-06 DIAGNOSIS — R632 Polyphagia: Secondary | ICD-10-CM

## 2023-07-06 DIAGNOSIS — Z87891 Personal history of nicotine dependence: Secondary | ICD-10-CM

## 2023-07-06 MED ORDER — TRULICITY 1.5 MG/0.5ML ~~LOC~~ SOAJ: 1.5000 mg | SUBCUTANEOUS | 0 refills | Status: DC

## 2023-07-06 NOTE — Progress Notes (Signed)
Office: [phone]  /  Fax: [phone]  WEIGHT SUMMARY AND BIOMETRICS  Weight Lost Since Last Visit: 0lb  Weight Gained Since Last Visit: 3lb   Vitals Temp: 98.9 F (37.2 C) BP: 129/72 Pulse Rate: 73 SpO2: 97 %   Anthropometric Measurements Height: 5\' 4"  (1.626 m) Weight: 205 lb (93 kg) BMI (Calculated): 35.17 Weight at Last Visit: 202lb Weight Lost Since Last Visit: 0lb Weight Gained Since Last Visit: 3lb Starting Weight: 218lb Total Weight Loss (lbs): 13 lb (5.897 kg)   Body Composition  Body Fat %: 44.2 % Fat Mass (lbs): 90.6 lbs Muscle Mass (lbs): 108.8 lbs Total Body Water (lbs): 71.6 lbs Visceral Fat Rating : 13   Other Clinical Data Fasting: No Labs: No Today's Visit #: 21 Starting Date: 10/24/21     HPI  Chief Complaint: OBESITY  Emily [name] is here to discuss her progress with her obesity treatment plan. She is on the the Category 2 Plan and states she is following her eating plan approximately 50 % of the time. She states she is exercising 0 minutes 0 days per week.   Interval History:  Since last office visit she has gained 3 pounds.  Has gotten back to old habits, gotten lazy and has gotten off track.  She is struggling with polyphagia and cravings.   She is drinking water daily. Denies sodas.      Pharmacotherapy for weight loss: She is not currently taking medications  for medical weight loss.    Previous pharmacotherapy for medical weight loss:  none  Bariatric surgery:  has not had bariatric surgery  Polyphagia Taking Trulicity 0.75mg .  Denies side effects.  Took Ozempic in the past and did well.  Wasn't able to continue due to cost.   I had prescribed her Ozempic in the past and she wasn't able to take due to cost.   Hypertension Hypertension BP looks better today.  Medication(s): hydrochlorothiazide 25mg ,  cozaar 100mg  Denies chest pain, palpitations and SOB.  BP Readings from Last 3 Encounters:  07/06/23 129/72  06/11/23  (!) 164/90  06/08/23 130/75   Lab Results  Component Value Date   CREATININE 0.80 06/11/2023   CREATININE 0.67 10/06/2022   CREATININE 0.62 06/03/2022    Prediabetes/IR Last A1c was 5.7  Medication(s): trulicity 0.75mg .  Took Ozempic for 2 weeks and did well.  Wasn't able to continue due to cost.  Polyphagia:Yes Lab Results  Component Value Date   HGBA1C 5.7 (H) 06/11/2023   HGBA1C 5.4 10/06/2022   HGBA1C 5.2 03/13/2022   HGBA1C 5.9 (H) 10/15/2021   HGBA1C 5.5 01/21/2021   Lab Results  Component Value Date   INSULIN 16.2 10/06/2022   INSULIN 21.9 10/24/2021   INSULIN 33.8 (H) 11/17/2018   Hypothyroidism Stable.  Does not report symptoms associated with uncontrolled hypothyroidism. Medication(s): Levothyroxine 125 mcg daily.  Was decreased by PCP.  Has appt on Friday with PCP for follow up.   Lab Results  Component Value Date   TSH 0.158 (L) 06/11/2023   Hyperlipidemia Medication(s): never been on meds.   Cardiovascular risk factors: family history of premature cardiovascular disease, hypertension, obesity (BMI >= 30 kg/m2), and sedentary lifestyle FH:  mgm, pgm, father, mother  Lab Results  Component Value Date   CHOL 207 (H) 06/11/2023   HDL 46 06/11/2023   LDLCALC 135 (H) 06/11/2023   TRIG 144 06/11/2023   Lab Results  Component Value Date   ALT 38 (H) 06/11/2023   AST 24 06/11/2023  ALKPHOS 87 06/11/2023   BILITOT 0.4 06/11/2023   The 10-year ASCVD risk score (Arnett DK, et al., 2019) is: 7.6%   Values used to calculate the score:     Age: 62 years     Sex: Female     Is Non-Hispanic African American: No     Diabetic: No     Tobacco smoker: No     Systolic Blood Pressure: 129 mmHg     Is BP treated: Yes     HDL Cholesterol: 46 mg/dL     Total Cholesterol: 207 mg/dL   Elevated liver enzyme Last ALT was elevated.  Denies alcohol.  Took Tylenol prior to labs. Seeing PCP this week for follow up.    PHYSICAL EXAM:  Blood pressure 129/72, pulse 73,  temperature 98.9 F (37.2 C), height 5\' 4"  (1.626 m), weight 205 lb (93 kg), SpO2 97%. Body mass index is 35.19 kg/m.  General: She is overweight, cooperative, alert, well developed, and in no acute distress. PSYCH: Has normal mood, affect and thought process.   Extremities: No edema.  Neurologic: No gross sensory or motor deficits. No tremors or fasciculations noted.    DIAGNOSTIC DATA REVIEWED:  BMET    Component Value Date/Time   NA 139 06/11/2023 1439   NA 139 10/24/2014 0708   K 3.6 06/11/2023 1439   K 3.9 10/24/2014 0708   CL 101 06/11/2023 1439   CL 106 10/24/2014 0708   CO2 22 06/11/2023 1439   CO2 27 10/24/2014 0708   GLUCOSE 122 (H) 06/11/2023 1439   GLUCOSE 97 11/12/2015 1827   GLUCOSE 108 (H) 10/24/2014 0708   BUN 23 06/11/2023 1439   BUN 21 (H) 10/24/2014 0708   CREATININE 0.80 06/11/2023 1439   CREATININE 0.76 10/24/2014 0708   CALCIUM 10.4 (H) 06/11/2023 1439   CALCIUM 8.8 10/24/2014 0708   GFRNONAA 80 02/28/2020 1532   GFRNONAA >60 10/24/2014 0708   GFRAA 93 02/28/2020 1532   GFRAA >60 10/24/2014 0708   Lab Results  Component Value Date   HGBA1C 5.7 (H) 06/11/2023   HGBA1C 5.6 08/26/2018   Lab Results  Component Value Date   INSULIN 16.2 10/06/2022   INSULIN 33.8 (H) 11/17/2018   Lab Results  Component Value Date   TSH 0.158 (L) 06/11/2023   CBC    Component Value Date/Time   WBC 8.1 06/11/2023 1439   WBC 8.7 06/11/2023 1439   WBC 7.5 11/12/2015 1827   RBC 4.82 06/11/2023 1439   RBC 4.81 06/11/2023 1439   RBC 4.44 11/12/2015 1827   HGB 15.0 06/11/2023 1439   HGB 15.4 06/11/2023 1439   HCT 44.7 06/11/2023 1439   HCT 43.2 06/11/2023 1439   PLT 299 06/11/2023 1439   PLT 300 06/11/2023 1439   MCV 93 06/11/2023 1439   MCV 90 06/11/2023 1439   MCV 94 10/24/2014 0708   MCH 31.1 06/11/2023 1439   MCH 32.0 06/11/2023 1439   MCH 30.9 11/12/2015 1827   MCHC 33.6 06/11/2023 1439   MCHC 35.6 06/11/2023 1439   MCHC 33.7 11/12/2015 1827    RDW 12.3 06/11/2023 1439   RDW 12.9 06/11/2023 1439   RDW 13.2 10/24/2014 0708   Iron Studies No results found for: "IRON", "TIBC", "FERRITIN", "IRONPCTSAT" Lipid Panel     Component Value Date/Time   CHOL 207 (H) 06/11/2023 1439   TRIG 144 06/11/2023 1439   HDL 46 06/11/2023 1439   LDLCALC 135 (H) 06/11/2023 1439   Hepatic Function Panel  Component Value Date/Time   PROT 7.5 06/11/2023 1439   ALBUMIN 4.6 06/11/2023 1439   AST 24 06/11/2023 1439   ALT 38 (H) 06/11/2023 1439   ALKPHOS 87 06/11/2023 1439   BILITOT 0.4 06/11/2023 1439      Component Value Date/Time   TSH 0.158 (L) 06/11/2023 1439   Nutritional Lab Results  Component Value Date   VD25OH 47.6 06/11/2023   VD25OH 36.9 10/06/2022   VD25OH 61.1 06/03/2022     ASSESSMENT AND PLAN  TREATMENT PLAN FOR OBESITY:  Recommended Dietary Goals  Yuzuki is currently in the action stage of change. As such, her goal is to continue weight management plan. She has agreed to the Category 2 Plan.  Behavioral Intervention  We discussed the following Behavioral Modification Strategies today: increasing lean protein intake, decreasing simple carbohydrates , increasing vegetables, increasing lower glycemic fruits, increasing water intake, work on meal planning and preparation, reading food labels , keeping healthy foods at home, continue to practice mindfulness when eating, and planning for success.  Additional resources provided today: NA  Recommended Physical Activity Goals  Emily [name] has been advised to work up to 150 minutes of moderate intensity aerobic activity a week and strengthening exercises 2-3 times per week for cardiovascular health, weight loss maintenance and preservation of muscle mass.   She has agreed to Think about ways to increase daily physical activity and overcoming barriers to exercise and Increase physical activity in their day and reduce sedentary time (increase NEAT).   Pharmacotherapy Avoid  Phentermine due to HTN Unable to take Wegovy or Zepbound due to cost  ASSOCIATED CONDITIONS ADDRESSED TODAY  Action/Plan  Essential hypertension Continue to follow up with PCP. Continue meds as directed  Pre-diabetes Emily [name] will continue to work on weight loss, exercise, and decreasing simple carbohydrates to help decrease the risk of diabetes.    Insulin resistance Emily [name] will continue to work on weight loss, exercise, and decreasing simple carbohydrates to help decrease the risk of diabetes. Emily [name] agreed to follow-up with [name] as directed to closely monitor her progress.   Polyphagia -     Increase Trulicity; Inject 1.5 mg into the skin once a week.  Dispense: 2 mL; Refill: 0.  Side effects discussed.   Other specified hypothyroidism Keep follow up appt with PCP  Elevated liver enzymes Keep follow up appt with PCP  Generalized obesity  BMI 35.0-35.9,adult     Labs reviewed in chart with patient from 06/11/23    Return in about 4 weeks (around 08/03/2023).Marland Kitchen She was informed of the importance of frequent follow up visits to maximize her success with intensive lifestyle modifications for her multiple health conditions.   ATTESTASTION STATEMENTS:  Reviewed by clinician on day of visit: allergies, medications, problem list, medical history, surgical history, family history, social history, and previous encounter notes.    Theodis Sato. Chanique Duca FNP-C

## 2023-07-06 NOTE — Patient Instructions (Signed)

## 2023-07-07 NOTE — Telephone Encounter (Signed)
Unable to refill per protocol, Rx expired.   Requested Prescriptions  Pending Prescriptions Disp Refills   levothyroxine (SYNTHROID) 137 MCG tablet [Pharmacy Med Name: L-THYROXINE (SYNTHROID) TABS 137MCG] 90 tablet 3    Sig: TAKE 1 TABLET DAILY BEFORE BREAKFAST     Endocrinology:  Hypothyroid Agents Failed - 07/06/2023  1:23 AM      Failed - TSH in normal range and within 360 days    TSH  Date Value Ref Range Status  06/11/2023 0.158 (L) 0.450 - 4.500 uIU/mL Final         Passed - Valid encounter within last 12 months    Recent Outpatient Visits           3 weeks ago Vertigo   [place] [facility] [place], Megan P, DO   7 months ago Otalgia, unspecified laterality   [place] [facility] Larae Grooms, NP   8 months ago Dysuria   [place] [facility] [place], Megan P, DO   10 months ago Urinary tract infection with hematuria, site unspecified   [place] [facility] Mecum, Oswaldo Conroy, PA-C   1 year ago Routine general medical examination at a health care facility   [facility] Dorcas Carrow, DO       Future Appointments             In 3 days Dorcas Carrow, DO [place] [facility]

## 2023-07-10 ENCOUNTER — Encounter: Payer: Self-pay | Admitting: Family Medicine

## 2023-07-10 ENCOUNTER — Ambulatory Visit: Payer: Medicare PPO | Admitting: Family Medicine

## 2023-07-10 VITALS — BP 124/82 | HR 77 | Ht 64.0 in | Wt 210.2 lb

## 2023-07-10 DIAGNOSIS — R42 Dizziness and giddiness: Secondary | ICD-10-CM

## 2023-07-10 DIAGNOSIS — E038 Other specified hypothyroidism: Secondary | ICD-10-CM

## 2023-07-10 DIAGNOSIS — I10 Essential (primary) hypertension: Secondary | ICD-10-CM | POA: Diagnosis not present

## 2023-07-10 NOTE — Assessment & Plan Note (Signed)
Due for recheck in 2 weeks. Await results.

## 2023-07-10 NOTE — Progress Notes (Signed)
BP 124/82   Pulse 77   Ht 5\' 4"  (1.626 m)   Wt 210 lb 3.2 oz (95.3 kg)   SpO2 96%   BMI 36.08 kg/m    Subjective:    Patient ID: Emily [name], female    DOB: May 18, 1961, 62 y.o.   MRN: [id number]  HPI: Emily [name] is a 62 y.o. female  Chief Complaint  Patient presents with   Hypertension   Dizziness        Vertigo has entirely resolved. She is feeling back to normal. No concerns.   HYPERTENSION  Hypertension status: controlled  Satisfied with current treatment? yes Duration of hypertension: chronic BP monitoring frequency:  a few times a month BP medication side effects:  no Medication compliance: excellent Previous BP meds:losartan, HCTZ Aspirin: no Recurrent headaches: no Visual changes: no Palpitations: no Dyspnea: no Chest pain: no Lower extremity edema: no Dizzy/lightheaded: no   Relevant past medical, surgical, family and social history reviewed and updated as indicated. Interim medical history since our last visit reviewed. Allergies and medications reviewed and updated.  Review of Systems  Constitutional: Negative.   Respiratory: Negative.    Cardiovascular: Negative.   Gastrointestinal: Negative.   Musculoskeletal: Negative.   Psychiatric/Behavioral: Negative.      Per HPI unless specifically indicated above     Objective:    BP 124/82   Pulse 77   Ht 5\' 4"  (1.626 m)   Wt 210 lb 3.2 oz (95.3 kg)   SpO2 96%   BMI 36.08 kg/m   Wt Readings from Last 3 Encounters:  07/10/23 210 lb 3.2 oz (95.3 kg)  07/06/23 205 lb (93 kg)  06/11/23 203 lb 6.4 oz (92.3 kg)    Physical Exam Vitals and nursing note reviewed.  Constitutional:      General: She is not in acute distress.    Appearance: Normal appearance. She is not ill-appearing, toxic-appearing or diaphoretic.  HENT:     Head: Normocephalic and atraumatic.     Right Ear: External ear normal.     Left Ear: External ear normal.     Nose: Nose normal.     Mouth/Throat:     Mouth:  Mucous membranes are moist.     Pharynx: Oropharynx is clear.  Eyes:     General: No scleral icterus.       Right eye: No discharge.        Left eye: No discharge.     Extraocular Movements: Extraocular movements intact.     Conjunctiva/sclera: Conjunctivae normal.     Pupils: Pupils are equal, round, and reactive to light.  Cardiovascular:     Rate and Rhythm: Normal rate and regular rhythm.     Pulses: Normal pulses.     Heart sounds: Normal heart sounds. No murmur heard.    No friction rub. No gallop.  Pulmonary:     Effort: Pulmonary effort is normal. No respiratory distress.     Breath sounds: Normal breath sounds. No stridor. No wheezing, rhonchi or rales.  Chest:     Chest wall: No tenderness.  Musculoskeletal:        General: Normal range of motion.     Cervical back: Normal range of motion and neck supple.  Skin:    General: Skin is warm and dry.     Capillary Refill: Capillary refill takes less than 2 seconds.     Coloration: Skin is not jaundiced or pale.     Findings: No bruising,  erythema, lesion or rash.  Neurological:     General: No focal deficit present.     Mental Status: She is alert and oriented to person, place, and time. Mental status is at baseline.  Psychiatric:        Mood and Affect: Mood normal.        Behavior: Behavior normal.        Thought Content: Thought content normal.        Judgment: Judgment normal.     Results for orders placed or performed in visit on 06/11/23  Novel Coronavirus, NAA ([facility])   Specimen: Nasopharyngeal(NP) swabs in vial transport medium  Result Value Ref Range   SARS-CoV-2, NAA Not Detected Not Detected  Microscopic Examination   Urine  Result Value Ref Range   WBC, UA 0-5 0 - 5 /hpf   RBC, Urine 0-2 0 - 2 /hpf   Epithelial Cells (non renal) 0-10 0 - 10 /hpf   Casts Present (A) None seen /lpf   Cast Type Hyaline casts N/A   Mucus, UA Present (A) Not Estab.   Bacteria, UA None seen None seen/Few  CBC with  Differential/Platelet  Result Value Ref Range   WBC 8.1 3.4 - 10.8 x10E3/uL   RBC 4.82 3.77 - 5.28 x10E6/uL   Hemoglobin 15.0 11.1 - 15.9 g/dL   Hematocrit 16.1 09.6 - 46.6 %   MCV 93 79 - 97 fL   MCH 31.1 26.6 - 33.0 pg   MCHC 33.6 31.5 - 35.7 g/dL   RDW 04.5 40.9 - 81.1 %   Platelets 299 150 - 450 x10E3/uL   Neutrophils 54 Not Estab. %   Lymphs 35 Not Estab. %   Monocytes 8 Not Estab. %   Eos 2 Not Estab. %   Basos 1 Not Estab. %   Neutrophils Absolute 4.4 1.4 - 7.0 x10E3/uL   Lymphocytes Absolute 2.9 0.7 - 3.1 x10E3/uL   Monocytes Absolute 0.6 0.1 - 0.9 x10E3/uL   EOS (ABSOLUTE) 0.1 0.0 - 0.4 x10E3/uL   Basophils Absolute 0.1 0.0 - 0.2 x10E3/uL   Immature Granulocytes 0 Not Estab. %   Immature Grans (Abs) 0.0 0.0 - 0.1 x10E3/uL  Comprehensive metabolic panel  Result Value Ref Range   Glucose 122 (H) 70 - 99 mg/dL   BUN 23 8 - 27 mg/dL   Creatinine, Ser 9.14 0.57 - 1.00 mg/dL   eGFR 83 >78 GN/FAO/1.30   BUN/Creatinine Ratio 29 (H) 12 - 28   Sodium 139 134 - 144 mmol/L   Potassium 3.6 3.5 - 5.2 mmol/L   Chloride 101 96 - 106 mmol/L   CO2 22 20 - 29 mmol/L   Calcium 10.4 (H) 8.7 - 10.3 mg/dL   Total Protein 7.5 6.0 - 8.5 g/dL   Albumin 4.6 3.9 - 4.9 g/dL   Globulin, Total 2.9 1.5 - 4.5 g/dL   Bilirubin Total 0.4 0.0 - 1.2 mg/dL   Alkaline Phosphatase 87 44 - 121 IU/L   AST 24 0 - 40 IU/L   ALT 38 (H) 0 - 32 IU/L  Lipid Panel w/o Chol/HDL Ratio  Result Value Ref Range   Cholesterol, Total 207 (H) 100 - 199 mg/dL   Triglycerides 865 0 - 149 mg/dL   HDL 46 >78 mg/dL   VLDL Cholesterol Cal 26 5 - 40 mg/dL   LDL Chol Calc (NIH) 469 (H) 0 - 99 mg/dL  Urinalysis, Routine w reflex microscopic  Result Value Ref Range   Specific Gravity, UA >1.030 (  H) 1.005 - 1.030   pH, UA 5.5 5.0 - 7.5   Color, UA Yellow Yellow   Appearance Ur Clear Clear   Leukocytes,UA Negative Negative   Protein,UA 1+ (A) Negative/Trace   Glucose, UA Negative Negative   Ketones, UA Trace (A)  Negative   RBC, UA Trace (A) Negative   Bilirubin, UA Negative Negative   Urobilinogen, Ur 0.2 0.2 - 1.0 mg/dL   Nitrite, UA Negative Negative   Microscopic Examination See below:   TSH  Result Value Ref Range   TSH 0.158 (L) 0.450 - 4.500 uIU/mL  Microalbumin, Urine Waived  Result Value Ref Range   Microalb, Ur Waived 150 (H) 0 - 19 mg/L   Creatinine, Urine Waived 300 10 - 300 mg/dL   Microalb/Creat Ratio 30-300 (H) <30 mg/g  Bayer DCA Hb A1c Waived  Result Value Ref Range   HB A1C (BAYER DCA - WAIVED) 5.7 (H) 4.8 - 5.6 %  VITAMIN D 25 Hydroxy (Vit-D Deficiency, Fractures)  Result Value Ref Range   Vit D, 25-Hydroxy 47.6 30.0 - 100.0 ng/mL  CBC  Result Value Ref Range   WBC 8.7 3.4 - 10.8 x10E3/uL   RBC 4.81 3.77 - 5.28 x10E6/uL   Hemoglobin 15.4 11.1 - 15.9 g/dL   Hematocrit 16.1 09.6 - 46.6 %   MCV 90 79 - 97 fL   MCH 32.0 26.6 - 33.0 pg   MCHC 35.6 31.5 - 35.7 g/dL   RDW 04.5 40.9 - 81.1 %   Platelets 300 150 - 450 x10E3/uL      Assessment & Plan:   Problem List Items Addressed This Visit       Cardiovascular and Mediastinum   Essential hypertension    Under good control on current regimen. Continue current regimen. Continue to monitor. Call with any concerns.        Endocrine   Hypothyroidism - Primary    Due for recheck in 2 weeks. Await results.       Other Visit Diagnoses     Vertigo       Resolved. Feeling back to normal. Continue to monitor.        Follow up plan: Return in about 5 months (around 12/10/2023).

## 2023-07-10 NOTE — Assessment & Plan Note (Signed)
Under good control on current regimen. Continue current regimen. Continue to monitor. Call with any concerns.

## 2023-07-23 ENCOUNTER — Other Ambulatory Visit: Payer: Medicare Other

## 2023-07-24 ENCOUNTER — Other Ambulatory Visit: Payer: Self-pay | Admitting: Family Medicine

## 2023-07-24 NOTE — Telephone Encounter (Signed)
Requested Prescriptions  Pending Prescriptions Disp Refills   meloxicam (MOBIC) 7.5 MG tablet [Pharmacy Med Name: MELOXICAM TABS 7.5MG ] 90 tablet 0    Sig: TAKE 1 TABLET DAILY     Analgesics:  COX2 Inhibitors Failed - 07/24/2023  1:29 AM      Failed - Manual Review: Labs are only required if the patient has taken medication for more than 8 weeks.      Failed - ALT in normal range and within 360 days    ALT  Date Value Ref Range Status  06/11/2023 38 (H) 0 - 32 IU/L Final         Passed - HGB in normal range and within 360 days    Hemoglobin  Date Value Ref Range Status  06/11/2023 15.0 11.1 - 15.9 g/dL Final  16/07/9603 54.0 11.1 - 15.9 g/dL Final         Passed - Cr in normal range and within 360 days    Creatinine  Date Value Ref Range Status  10/24/2014 0.76 0.60 - 1.30 mg/dL Final   Creatinine, Ser  Date Value Ref Range Status  06/11/2023 0.80 0.57 - 1.00 mg/dL Final         Passed - HCT in normal range and within 360 days    Hematocrit  Date Value Ref Range Status  06/11/2023 44.7 34.0 - 46.6 % Final  06/11/2023 43.2 34.0 - 46.6 % Final         Passed - AST in normal range and within 360 days    AST  Date Value Ref Range Status  06/11/2023 24 0 - 40 IU/L Final         Passed - eGFR is 30 or above and within 360 days    EGFR (African American)  Date Value Ref Range Status  10/24/2014 >60 >65mL/min Final   GFR calc Af Amer  Date Value Ref Range Status  02/28/2020 93 >59 mL/min/1.73 Final    Comment:    **[facility] currently reports eGFR in compliance with the current**   recommendations of the [employer]. [facility] will   update reporting as new guidelines are published from the NKF-ASN   Task force.    EGFR (Non-African Amer.)  Date Value Ref Range Status  10/24/2014 >60 >30mL/min Final    Comment:    eGFR values <69mL/min/1.73 m2 may be an indication of chronic kidney disease (CKD). Calculated eGFR, using the MRDR Study equation, is  useful in  patients with stable renal function. The eGFR calculation will not be reliable in acutely ill patients when serum creatinine is changing rapidly. It is not useful in patients on dialysis. The eGFR calculation may not be applicable to patients at the low and high extremes of body sizes, pregnant women, and vegetarians.    GFR calc non Af Amer  Date Value Ref Range Status  02/28/2020 80 >59 mL/min/1.73 Final   eGFR  Date Value Ref Range Status  06/11/2023 83 >59 mL/min/1.73 Final         Passed - Patient is not pregnant      Passed - Valid encounter within last 12 months    Recent Outpatient Visits           2 weeks ago Other specified hypothyroidism   [place] [facility] [place], Megan P, DO   1 month ago Vertigo   [place] [facility] [place], Megan P, DO   7 months ago Otalgia, unspecified laterality  [place] [facility] Larae Grooms, NP   9 months ago Dysuria   [place] [facility] [place], Megan P, DO   11 months ago Urinary tract infection with hematuria, site unspecified   [place] [facility] Mecum, Oswaldo Conroy, PA-C

## 2023-07-26 ENCOUNTER — Other Ambulatory Visit: Payer: Self-pay | Admitting: Nurse Practitioner

## 2023-07-26 DIAGNOSIS — E559 Vitamin D deficiency, unspecified: Secondary | ICD-10-CM

## 2023-07-28 ENCOUNTER — Other Ambulatory Visit: Payer: Self-pay | Admitting: Family Medicine

## 2023-07-29 NOTE — Telephone Encounter (Signed)
Requested Prescriptions  Pending Prescriptions Disp Refills   losartan (COZAAR) 100 MG tablet [Pharmacy Med Name: LOSARTAN TABS 100MG ] 90 tablet 0    Sig: TAKE 1 TABLET DAILY     Cardiovascular:  Angiotensin Receptor Blockers Passed - 07/28/2023  4:06 PM      Passed - Cr in normal range and within 180 days    Creatinine  Date Value Ref Range Status  10/24/2014 0.76 0.60 - 1.30 mg/dL Final   Creatinine, Ser  Date Value Ref Range Status  06/11/2023 0.80 0.57 - 1.00 mg/dL Final         Passed - K in normal range and within 180 days    Potassium  Date Value Ref Range Status  06/11/2023 3.6 3.5 - 5.2 mmol/L Final  10/24/2014 3.9 3.5 - 5.1 mmol/L Final         Passed - Patient is not pregnant      Passed - Last BP in normal range    BP Readings from Last 1 Encounters:  07/10/23 124/82         Passed - Valid encounter within last 6 months    Recent Outpatient Visits           2 weeks ago Other specified hypothyroidism   [place] [facility] [place], [place], DO   1 month ago Vertigo   [place] [facility] [place], [place], DO   7 months ago Otalgia, unspecified laterality   [place] [facility] Larae Grooms, NP   9 months ago Dysuria   [place] [facility], Megan P, DO   11 months ago Urinary tract infection with hematuria, site unspecified   [place] [facility] Mecum, Oswaldo Conroy, PA-C

## 2023-08-10 ENCOUNTER — Ambulatory Visit: Payer: BC Managed Care – PPO | Admitting: Nurse Practitioner

## 2023-08-11 ENCOUNTER — Other Ambulatory Visit: Payer: Self-pay | Admitting: Nurse Practitioner

## 2023-08-11 DIAGNOSIS — R632 Polyphagia: Secondary | ICD-10-CM

## 2023-08-24 ENCOUNTER — Ambulatory Visit: Payer: BC Managed Care – PPO | Admitting: Nurse Practitioner

## 2023-08-31 ENCOUNTER — Other Ambulatory Visit: Payer: Self-pay | Admitting: Family Medicine

## 2023-08-31 DIAGNOSIS — E038 Other specified hypothyroidism: Secondary | ICD-10-CM

## 2023-08-31 NOTE — Telephone Encounter (Signed)
Request short supply to [facility] until mail order arrives.

## 2023-08-31 NOTE — Telephone Encounter (Signed)
Medication Refill -  Most Recent Primary Care Visit:  Provider: Olevia Perches P  Department: [facility]  Visit Type: OFFICE VISIT  Date: 07/10/2023  Medication:  levothyroxine (SYNTHROID) 125 MCG tablet  *completely out, been out for 2 days and [facility] says this needs to be sent to [facility] because insurance won't cover this medication at [facility]  *Patient is wanting the prescription sent to [facility] but patient states it takes a week so needs a short supply sent to [facility] while she waits on [facility], patient is willing to pay out of pocket for this.  [facility][id number] [place],  - [address] AT [facility] [address] [facility] Phone: [phone]  Fax: [phone]        Has the patient contacted their pharmacy? Yes, advised to contact PCP to send to another pharmacy  Is this the correct pharmacy for this prescription? YES, the one listed below If no, delete pharmacy and type the correct one.  This is the patient's preferred pharmacy:  [facility] - [place], [place] - [address] [place] [address] Phone: [phone] Fax: [phone]  Has the prescription been filled recently?  Last fill 8.25.2024  Is the patient out of the medication? Yes, been out for 2 days  Has the patient been seen for an appointment in the last year OR does the patient have an upcoming appointment? YES. F/U scheduled on 11.26.2024 with PCP

## 2023-09-01 ENCOUNTER — Other Ambulatory Visit: Payer: Self-pay | Admitting: Family Medicine

## 2023-09-01 DIAGNOSIS — E038 Other specified hypothyroidism: Secondary | ICD-10-CM

## 2023-09-01 NOTE — Telephone Encounter (Signed)
Requested medications are due for refill today.  yes  Requested medications are on the active medications list.  yes  Last refill. 06/14/2023 #90 0 rf  Future visit scheduled.   yes  Notes to clinic.  Abnormal labs. Pt need [facility] , and regular refill sent to mail away.    Requested Prescriptions  Pending Prescriptions Disp Refills   levothyroxine (SYNTHROID) 125 MCG tablet 90 tablet 0    Sig: Take 1 tablet (125 mcg total) by mouth daily before breakfast.     Endocrinology:  Hypothyroid Agents Failed - 08/31/2023  4:09 PM      Failed - TSH in normal range and within 360 days    TSH  Date Value Ref Range Status  06/11/2023 0.158 (L) 0.450 - 4.500 uIU/mL Final         Passed - Valid encounter within last 12 months    Recent Outpatient Visits           1 month ago Other specified hypothyroidism   [place] [facility] [place], [place], DO   2 months ago Vertigo   [place] [facility] [place], Megan P, DO   8 months ago Otalgia, unspecified laterality   [facility] Larae Grooms, NP   10 months ago Dysuria   [place] [facility] [place], [place], DO   1 year ago Urinary tract infection with hematuria, site unspecified   [place] [facility] Mecum, Oswaldo Conroy, PA-C       Future Appointments             In 2 weeks Laural Benes, Oralia Rud, DO [place] [facility], [facility]

## 2023-09-01 NOTE — Telephone Encounter (Signed)
Medication Refill -  Most Recent Primary Care Visit:  Provider: Olevia Perches P  Department: [facility]  Visit Type: OFFICE VISIT  Date: 07/10/2023  Medication:  levothyroxine (SYNTHROID) 125 MCG tablet venlafaxine XR (EFFEXOR-XR) 150 MG 24 hr capsule losartan (COZAAR) 100 MG tablet meloxicam (MOBIC) 7.5 MG tablet  Has the patient contacted their pharmacy? Yes (This is mail order calling for the pt  Is this the correct pharmacy for this prescription? Yes If no, delete pharmacy and type the correct one.  This is the patient's preferred pharmacy:  [facility] - [place], [place] - [address]  Has the prescription been filled recently? Yes  Is the patient out of the medication? unsure  Has the patient been seen for an appointment in the last year OR does the patient have an upcoming appointment? Yes  Can we respond through MyChart? Yes  Agent: Please be advised that Rx refills may take up to 3 business days. We ask that you follow-up with your pharmacy.

## 2023-09-02 MED ORDER — MELOXICAM 7.5 MG PO TABS: 7.5000 mg | ORAL_TABLET | Freq: Every day | ORAL | 0 refills | Status: DC

## 2023-09-02 MED ORDER — LEVOTHYROXINE SODIUM 125 MCG PO TABS: 125.0000 ug | ORAL_TABLET | Freq: Every day | ORAL | 0 refills | Status: DC

## 2023-09-02 MED ORDER — VENLAFAXINE HCL ER 150 MG PO CP24: 150.0000 mg | ORAL_CAPSULE | Freq: Every day | ORAL | 0 refills | Status: DC

## 2023-09-02 MED ORDER — LOSARTAN POTASSIUM 100 MG PO TABS: 100.0000 mg | ORAL_TABLET | Freq: Every day | ORAL | 0 refills | Status: DC

## 2023-09-02 NOTE — Telephone Encounter (Signed)
Pt requested pharmacy change to [facility]  Requested Prescriptions  Pending Prescriptions Disp Refills   levothyroxine (SYNTHROID) 125 MCG tablet 90 tablet 0    Sig: Take 1 tablet (125 mcg total) by mouth daily before breakfast.     Endocrinology:  Hypothyroid Agents Failed - 09/01/2023  9:39 AM      Failed - TSH in normal range and within 360 days    TSH  Date Value Ref Range Status  06/11/2023 0.158 (L) 0.450 - 4.500 uIU/mL Final         Passed - Valid encounter within last 12 months    Recent Outpatient Visits           1 month ago Other specified hypothyroidism   [place] [facility] [place], Megan P, DO   2 months ago Vertigo   [place] [facility] [place], Megan P, DO   8 months ago Otalgia, unspecified laterality   [place] [facility] Larae Grooms, NP   10 months ago Dysuria   [place] [facility] [place], Megan P, DO   1 year ago Urinary tract infection with hematuria, site unspecified   [place] [facility] Mecum, Oswaldo Conroy, PA-C       Future Appointments             In 1 week Laural Benes, Oralia Rud, DO [place] [facility], [facility]             venlafaxine XR (EFFEXOR-XR) 150 MG 24 hr capsule 90 capsule 0    Sig: Take 1 capsule (150 mg total) by mouth daily with breakfast.     Psychiatry: Antidepressants - SNRI - desvenlafaxine & venlafaxine Failed - 09/01/2023  9:39 AM      Failed - Lipid Panel in normal range within the last 12 months    Cholesterol, Total  Date Value Ref Range Status  06/11/2023 207 (H) 100 - 199 mg/dL Final   LDL Chol Calc (NIH)  Date Value Ref Range Status  06/11/2023 135 (H) 0 - 99 mg/dL Final   HDL  Date Value Ref Range Status  06/11/2023 46 >39 mg/dL Final   Triglycerides  Date Value Ref Range Status  06/11/2023 144 0 - 149 mg/dL Final         Passed - Cr in normal range and within 360 days    Creatinine  Date Value Ref  Range Status  10/24/2014 0.76 0.60 - 1.30 mg/dL Final   Creatinine, Ser  Date Value Ref Range Status  06/11/2023 0.80 0.57 - 1.00 mg/dL Final         Passed - Completed PHQ-2 or PHQ-9 in the last 360 days      Passed - Last BP in normal range    BP Readings from Last 1 Encounters:  07/10/23 124/82         Passed - Valid encounter within last 6 months    Recent Outpatient Visits           1 month ago Other specified hypothyroidism   [place] [facility] [place], [place], DO   2 months ago Vertigo   [place] [facility] [place], [place], DO   8 months ago Otalgia, unspecified laterality   [place] [facility]ae Grooms, NP   10 months ago Dysuria   [place] [facility] [place], Megan P, DO   1 year ago Urinary tract infection with hematuria,  site unspecified   [place] [facility] Mecum, Oswaldo Conroy, PA-C       Future Appointments             In 1 week Laural Benes, Oralia Rud, DO [place] [facility], [facility]             losartan (COZAAR) 100 MG tablet 90 tablet 0    Sig: Take 1 tablet (100 mg total) by mouth daily.     Cardiovascular:  Angiotensin Receptor Blockers Passed - 09/01/2023  9:39 AM      Passed - Cr in normal range and within 180 days    Creatinine  Date Value Ref Range Status  10/24/2014 0.76 0.60 - 1.30 mg/dL Final   Creatinine, Ser  Date Value Ref Range Status  06/11/2023 0.80 0.57 - 1.00 mg/dL Final         Passed - K in normal range and within 180 days    Potassium  Date Value Ref Range Status  06/11/2023 3.6 3.5 - 5.2 mmol/L Final  10/24/2014 3.9 3.5 - 5.1 mmol/L Final         Passed - Patient is not pregnant      Passed - Last BP in normal range    BP Readings from Last 1 Encounters:  07/10/23 124/82         Passed - Valid encounter within last 6 months    Recent Outpatient Visits           1 month ago Other specified hypothyroidism    [place] [facility] [place], [place], DO   2 months ago Vertigo   [place] [facility] [place], [place], DO   8 months ago Otalgia, unspecified laterality   [place] [facility] Larae Grooms, NP   10 months ago Dysuria   [place] [facility] [place], [place], DO   1 year ago Urinary tract infection with hematuria, site unspecified   [place] [facility] Mecum, Oswaldo Conroy, PA-C       Future Appointments             In 1 week Laural Benes, Oralia Rud, DO [place] [facility], [facility]             meloxicam (MOBIC) 7.5 MG tablet 90 tablet 0    Sig: Take 1 tablet (7.5 mg total) by mouth daily.     Analgesics:  COX2 Inhibitors Failed - 09/01/2023  9:39 AM      Failed - Manual Review: Labs are only required if the patient has taken medication for more than 8 weeks.      Failed - ALT in normal range and within 360 days    ALT  Date Value Ref Range Status  06/11/2023 38 (H) 0 - 32 IU/L Final         Passed - HGB in normal range and within 360 days    Hemoglobin  Date Value Ref Range Status  06/11/2023 15.0 11.1 - 15.9 g/dL Final  16/07/9603 54.0 11.1 - 15.9 g/dL Final         Passed - Cr in normal range and within 360 days    Creatinine  Date Value Ref Range Status  10/24/2014 0.76 0.60 - 1.30 mg/dL Final   Creatinine, Ser  Date Value Ref Range Status  06/11/2023 0.80 0.57 - 1.00 mg/dL Final         Passed - HCT in  normal range and within 360 days    Hematocrit  Date Value Ref Range Status  06/11/2023 44.7 34.0 - 46.6 % Final  06/11/2023 43.2 34.0 - 46.6 % Final         Passed - AST in normal range and within 360 days    AST  Date Value Ref Range Status  06/11/2023 24 0 - 40 IU/L Final         Passed - eGFR is 30 or above and within 360 days    EGFR (African American)  Date Value Ref Range Status  10/24/2014 >60 >74mL/min Final   GFR calc Af Amer  Date Value Ref Range  Status  02/28/2020 93 >59 mL/min/1.73 Final    Comment:    **[facility] currently reports eGFR in compliance with the current**   recommendations of the [employer]. [facility] will   update reporting as new guidelines are published from the NKF-ASN   Task force.    EGFR (Non-African Amer.)  Date Value Ref Range Status  10/24/2014 >60 >14mL/min Final    Comment:    eGFR values <55mL/min/1.73 m2 may be an indication of chronic kidney disease (CKD). Calculated eGFR, using the MRDR Study equation, is useful in  patients with stable renal function. The eGFR calculation will not be reliable in acutely ill patients when serum creatinine is changing rapidly. It is not useful in patients on dialysis. The eGFR calculation may not be applicable to patients at the low and high extremes of body sizes, pregnant women, and vegetarians.    GFR calc non Af Amer  Date Value Ref Range Status  02/28/2020 80 >59 mL/min/1.73 Final   eGFR  Date Value Ref Range Status  06/11/2023 83 >59 mL/min/1.73 Final         Passed - Patient is not pregnant      Passed - Valid encounter within last 12 months    Recent Outpatient Visits           1 month ago Other specified hypothyroidism   [place] [facility] Groom, [place], DO   2 months ago Vertigo   [place] [facility] [place], Megan P, DO   8 months ago Otalgia, unspecified laterality   [facility] Larae Grooms, NP   10 months ago Dysuria   [place] [facility] [place], [place], DO   1 year ago Urinary tract infection with hematuria, site unspecified   [place] [facility] Mecum, Oswaldo Conroy, PA-C       Future Appointments             In 1 week Laural Benes, Oralia Rud, DO [place] [facility], [facility]

## 2023-09-02 NOTE — Telephone Encounter (Signed)
Requested Prescriptions  Pending Prescriptions Disp Refills   levothyroxine (SYNTHROID) 125 MCG tablet [Pharmacy Med Name: LEVOTHYROXINE 0.125MG  ( ) TAB] 90 tablet 0    Sig: TAKE 1 TABLET(125 MCG) BY MOUTH DAILY BEFORE BREAKFAST     Endocrinology:  Hypothyroid Agents Failed - 08/31/2023  5:25 PM      Failed - TSH in normal range and within 360 days    TSH  Date Value Ref Range Status  06/11/2023 0.158 (L) 0.450 - 4.500 uIU/mL Final         Passed - Valid encounter within last 12 months    Recent Outpatient Visits           1 month ago Other specified hypothyroidism   [place] [facility] [place], [place], DO   2 months ago Vertigo   [place] [facility] [place], Megan P, DO   8 months ago Otalgia, unspecified laterality   [place] [facility] [place] Larae Grooms, NP   10 months ago Dysuria   [place] [facility] [place], [place], DO   1 year ago Urinary tract infection with hematuria, site unspecified   [place] [facility] Mecum, Oswaldo Conroy, PA-C       Future Appointments             In 1 week Laural Benes, Oralia Rud, DO [place] [facility], [facility]

## 2023-09-15 ENCOUNTER — Ambulatory Visit (INDEPENDENT_AMBULATORY_CARE_PROVIDER_SITE_OTHER): Payer: Medicare PPO | Admitting: Family Medicine

## 2023-09-15 ENCOUNTER — Encounter: Payer: Self-pay | Admitting: Family Medicine

## 2023-09-15 VITALS — BP 133/75 | HR 76 | Temp 99.1°F | Resp 16 | Wt 212.0 lb

## 2023-09-15 DIAGNOSIS — R42 Dizziness and giddiness: Secondary | ICD-10-CM | POA: Diagnosis not present

## 2023-09-15 DIAGNOSIS — E038 Other specified hypothyroidism: Secondary | ICD-10-CM

## 2023-09-15 DIAGNOSIS — J0101 Acute recurrent maxillary sinusitis: Secondary | ICD-10-CM | POA: Diagnosis not present

## 2023-09-15 DIAGNOSIS — I10 Essential (primary) hypertension: Secondary | ICD-10-CM

## 2023-09-15 MED ORDER — HYDROCHLOROTHIAZIDE 25 MG PO TABS: 25.0000 mg | ORAL_TABLET | Freq: Every day | ORAL | 1 refills | Status: DC

## 2023-09-15 MED ORDER — AMOXICILLIN-POT CLAVULANATE 875-125 MG PO TABS: 1.0000 | ORAL_TABLET | Freq: Two times a day (BID) | ORAL | 0 refills | Status: DC

## 2023-09-15 NOTE — Assessment & Plan Note (Signed)
Has been off her hydrochlorothiazide. Will restart and recheck in 6 weeks.

## 2023-09-15 NOTE — Assessment & Plan Note (Signed)
Rechecking labs today. Await results. Treat as needed.  °

## 2023-09-15 NOTE — Progress Notes (Signed)
BP 133/75 (BP Location: Left Arm, Patient Position: Sitting, Cuff Size: Large)   Pulse 76   Temp 99.1 F (37.3 C) (Oral)   Resp 16   Wt 212 lb (96.2 kg)   SpO2 97%   BMI 36.39 kg/m    Subjective:    Patient ID: Emily [name], female    DOB: 03/12/1961, 62 y.o.   MRN: [id number]  HPI: [name] Emily is a 62 y.o. female  Chief Complaint  Patient presents with   Thyroid Problem    Missed last appt and this is a r/s.     Dizziness    Started about 2 weeks with cold symptoms. Then had draining from her nasal's over night and when she stood up felt "drunk" and incredibly dizzy. States does not drink but that's how she felt. Fluid was clear like water. Cough still comes and goes. No energy but this is ongoing.    HYPOTHYROIDISM Thyroid control status:unsure Satisfied with current treatment? no Medication side effects: no Medication compliance: fair compliance Recent dose adjustment:yes Fatigue: yes Cold intolerance: no Heat intolerance: no Weight gain: no Weight loss: no Constipation: no Diarrhea/loose stools: no Palpitations: no Lower extremity edema: no Anxiety/depressed mood: yes  UPPER RESPIRATORY TRACT INFECTION Duration: 2+ weeks Worst symptom: aching and tired Fever: yes- about 2 weeks ago Cough: yes Shortness of breath: no Wheezing: no Chest pain: no Chest tightness: yes Chest congestion: no Nasal congestion: yes Runny nose: yes Post nasal drip: no Sneezing: yes Sore throat: no Swollen glands: no Sinus pressure: yes Headache: yes Face pain: no Toothache: no Ear pain: no  Ear pressure: no  Eyes red/itching:no Eye drainage/crusting: no  Vomiting: no Rash: no Fatigue: yes Sick contacts: no Strep contacts: no  Context: better, but not normal Recurrent sinusitis: no Relief with OTC cold/cough medications: no  Treatments attempted: none   Relevant past medical, surgical, family and social history reviewed and updated as indicated. Interim  medical history since our last visit reviewed. Allergies and medications reviewed and updated.  Review of Systems  Constitutional:  Positive for fatigue. Negative for activity change, appetite change, chills, diaphoresis, fever and unexpected weight change.  HENT:  Positive for congestion, postnasal drip, rhinorrhea, sinus pressure, sinus pain and sneezing. Negative for dental problem, drooling, ear discharge, ear pain, facial swelling, hearing loss, mouth sores, nosebleeds, sore throat, tinnitus, trouble swallowing and voice change.   Eyes: Negative.   Respiratory: Negative.    Cardiovascular: Negative.   Gastrointestinal: Negative.   Musculoskeletal: Negative.   Neurological:  Positive for dizziness. Negative for tremors, seizures, syncope, facial asymmetry, speech difficulty, weakness, light-headedness, numbness and headaches.  Psychiatric/Behavioral: Negative.      Per HPI unless specifically indicated above     Objective:    BP 133/75 (BP Location: Left Arm, Patient Position: Sitting, Cuff Size: Large)   Pulse 76   Temp 99.1 F (37.3 C) (Oral)   Resp 16   Wt 212 lb (96.2 kg)   SpO2 97%   BMI 36.39 kg/m   Wt Readings from Last 3 Encounters:  09/15/23 212 lb (96.2 kg)  07/10/23 210 lb 3.2 oz (95.3 kg)  07/06/23 205 lb (93 kg)    Physical Exam Vitals and nursing note reviewed.  Constitutional:      General: She is not in acute distress.    Appearance: Normal appearance. She is obese. She is not ill-appearing, toxic-appearing or diaphoretic.  HENT:     Head: Normocephalic and atraumatic.  Right Ear: External ear normal.     Left Ear: External ear normal.     Nose: Nose normal.     Mouth/Throat:     Mouth: Mucous membranes are moist.     Pharynx: Oropharynx is clear.  Eyes:     General: No scleral icterus.       Right eye: No discharge.        Left eye: No discharge.     Extraocular Movements: Extraocular movements intact.     Conjunctiva/sclera: Conjunctivae  normal.     Pupils: Pupils are equal, round, and reactive to light.  Cardiovascular:     Rate and Rhythm: Normal rate and regular rhythm.     Pulses: Normal pulses.     Heart sounds: Normal heart sounds. No murmur heard.    No friction rub. No gallop.  Pulmonary:     Effort: Pulmonary effort is normal. No respiratory distress.     Breath sounds: Normal breath sounds. No stridor. No wheezing, rhonchi or rales.  Chest:     Chest wall: No tenderness.  Musculoskeletal:        General: Normal range of motion.     Cervical back: Normal range of motion and neck supple.  Skin:    General: Skin is warm and dry.     Capillary Refill: Capillary refill takes less than 2 seconds.     Coloration: Skin is not jaundiced or pale.     Findings: No bruising, erythema, lesion or rash.  Neurological:     General: No focal deficit present.     Mental Status: She is alert and oriented to person, place, and time. Mental status is at baseline.  Psychiatric:        Mood and Affect: Mood normal.        Behavior: Behavior normal.        Thought Content: Thought content normal.        Judgment: Judgment normal.     Results for orders placed or performed in visit on 06/11/23  Novel Coronavirus, NAA ([facility])   Specimen: Nasopharyngeal(NP) swabs in vial transport medium  Result Value Ref Range   SARS-CoV-2, NAA Not Detected Not Detected  Microscopic Examination   Urine  Result Value Ref Range   WBC, UA 0-5 0 - 5 /hpf   RBC, Urine 0-2 0 - 2 /hpf   Epithelial Cells (non renal) 0-10 0 - 10 /hpf   Casts Present (A) None seen /lpf   Cast Type Hyaline casts N/A   Mucus, UA Present (A) Not Estab.   Bacteria, UA None seen None seen/Few  CBC with Differential/Platelet  Result Value Ref Range   WBC 8.1 3.4 - 10.8 x10E3/uL   RBC 4.82 3.77 - 5.28 x10E6/uL   Hemoglobin 15.0 11.1 - 15.9 g/dL   Hematocrit 16.1 09.6 - 46.6 %   MCV 93 79 - 97 fL   MCH 31.1 26.6 - 33.0 pg   MCHC 33.6 31.5 - 35.7 g/dL   RDW  04.5 40.9 - 81.1 %   Platelets 299 150 - 450 x10E3/uL   Neutrophils 54 Not Estab. %   Lymphs 35 Not Estab. %   Monocytes 8 Not Estab. %   Eos 2 Not Estab. %   Basos 1 Not Estab. %   Neutrophils Absolute 4.4 1.4 - 7.0 x10E3/uL   Lymphocytes Absolute 2.9 0.7 - 3.1 x10E3/uL   Monocytes Absolute 0.6 0.1 - 0.9 x10E3/uL   EOS (ABSOLUTE) 0.1 0.0 - 0.4  x10E3/uL   Basophils Absolute 0.1 0.0 - 0.2 x10E3/uL   Immature Granulocytes 0 Not Estab. %   Immature Grans (Abs) 0.0 0.0 - 0.1 x10E3/uL  Comprehensive metabolic panel  Result Value Ref Range   Glucose 122 (H) 70 - 99 mg/dL   BUN 23 8 - 27 mg/dL   Creatinine, Ser 9.62 0.57 - 1.00 mg/dL   eGFR 83 >95 MW/UXL/2.44   BUN/Creatinine Ratio 29 (H) 12 - 28   Sodium 139 134 - 144 mmol/L   Potassium 3.6 3.5 - 5.2 mmol/L   Chloride 101 96 - 106 mmol/L   CO2 22 20 - 29 mmol/L   Calcium 10.4 (H) 8.7 - 10.3 mg/dL   Total Protein 7.5 6.0 - 8.5 g/dL   Albumin 4.6 3.9 - 4.9 g/dL   Globulin, Total 2.9 1.5 - 4.5 g/dL   Bilirubin Total 0.4 0.0 - 1.2 mg/dL   Alkaline Phosphatase 87 44 - 121 IU/L   AST 24 0 - 40 IU/L   ALT 38 (H) 0 - 32 IU/L  Lipid Panel w/o Chol/HDL Ratio  Result Value Ref Range   Cholesterol, Total 207 (H) 100 - 199 mg/dL   Triglycerides 010 0 - 149 mg/dL   HDL 46 >27 mg/dL   VLDL Cholesterol Cal 26 5 - 40 mg/dL   LDL Chol Calc (NIH) 253 (H) 0 - 99 mg/dL  Urinalysis, Routine w reflex microscopic  Result Value Ref Range   Specific Gravity, UA >1.030 (H) 1.005 - 1.030   pH, UA 5.5 5.0 - 7.5   Color, UA Yellow Yellow   Appearance Ur Clear Clear   Leukocytes,UA Negative Negative   Protein,UA 1+ (A) Negative/Trace   Glucose, UA Negative Negative   Ketones, UA Trace (A) Negative   RBC, UA Trace (A) Negative   Bilirubin, UA Negative Negative   Urobilinogen, Ur 0.2 0.2 - 1.0 mg/dL   Nitrite, UA Negative Negative   Microscopic Examination See below:   TSH  Result Value Ref Range   TSH 0.158 (L) 0.450 - 4.500 uIU/mL  Microalbumin,  Urine Waived  Result Value Ref Range   Microalb, Ur Waived 150 (H) 0 - 19 mg/L   Creatinine, Urine Waived 300 10 - 300 mg/dL   Microalb/Creat Ratio 30-300 (H) <30 mg/g  Bayer DCA Hb A1c Waived  Result Value Ref Range   HB A1C (BAYER DCA - WAIVED) 5.7 (H) 4.8 - 5.6 %  VITAMIN D 25 Hydroxy (Vit-D Deficiency, Fractures)  Result Value Ref Range   Vit D, 25-Hydroxy 47.6 30.0 - 100.0 ng/mL  CBC  Result Value Ref Range   WBC 8.7 3.4 - 10.8 x10E3/uL   RBC 4.81 3.77 - 5.28 x10E6/uL   Hemoglobin 15.4 11.1 - 15.9 g/dL   Hematocrit 66.4 40.3 - 46.6 %   MCV 90 79 - 97 fL   MCH 32.0 26.6 - 33.0 pg   MCHC 35.6 31.5 - 35.7 g/dL   RDW 47.4 25.9 - 56.3 %   Platelets 300 150 - 450 x10E3/uL      Assessment & Plan:   Problem List Items Addressed This Visit       Cardiovascular and Mediastinum   Essential hypertension    Has been off her hydrochlorothiazide. Will restart and recheck in 6 weeks.       Relevant Medications   hydrochlorothiazide (HYDRODIURIL) 25 MG tablet     Endocrine   Hypothyroidism - Primary    Rechecking labs today. Await results. Treat as needed.  Other Visit Diagnoses     Acute recurrent maxillary sinusitis       Will treat with augmentin. Call with any concerns or if not improving. Continue to monitor.   Relevant Medications   amoxicillin-clavulanate (AUGMENTIN) 875-125 MG tablet   Vertigo       Likely acting up with sinusitis. Will treat with augmentin and start epley's manuvers. Call if not better by Monday and we'll get her into PT for [facility].        Follow up plan: Return in about 6 weeks (around 10/27/2023) for physical.

## 2023-09-16 ENCOUNTER — Other Ambulatory Visit: Payer: Self-pay | Admitting: Family Medicine

## 2023-09-16 DIAGNOSIS — E038 Other specified hypothyroidism: Secondary | ICD-10-CM

## 2023-09-16 LAB — TSH: TSH: 2.85 u[IU]/mL (ref 0.450–4.500)

## 2023-09-16 MED ORDER — LEVOTHYROXINE SODIUM 125 MCG PO TABS: 125.0000 ug | ORAL_TABLET | Freq: Every day | ORAL | 3 refills | Status: DC

## 2023-10-05 ENCOUNTER — Other Ambulatory Visit: Payer: Self-pay | Admitting: Family Medicine

## 2023-10-05 DIAGNOSIS — E038 Other specified hypothyroidism: Secondary | ICD-10-CM

## 2023-10-06 NOTE — Telephone Encounter (Signed)
Requested Prescriptions  Refused Prescriptions Disp Refills   levothyroxine (SYNTHROID) 125 MCG tablet [Pharmacy Med Name: LEVOTHYROXINE 0.125MG  ( ) TAB] 90 tablet 3    Sig: TAKE 1 TABLET(125 MCG) BY MOUTH DAILY BEFORE BREAKFAST     Endocrinology:  Hypothyroid Agents Passed - 10/06/2023 11:27 AM      Passed - TSH in normal range and within 360 days    TSH  Date Value Ref Range Status  09/15/2023 2.850 0.450 - 4.500 uIU/mL Final         Passed - Valid encounter within last 12 months    Recent Outpatient Visits           3 weeks ago Other specified hypothyroidism   [place] [facility] [place], Megan P, DO   2 months ago Other specified hypothyroidism   [place] [facility] [place], [place], DO   3 months ago Vertigo   [place] [facility] [place], Megan P, DO   10 months ago Otalgia, unspecified laterality   [place] [facility] Larae Grooms, NP   11 months ago Dysuria   [place] [facility] [place], Oralia Rud, DO       Future Appointments             In 2 weeks [place], Oralia Rud, DO [place] [facility], [facility]

## 2023-10-16 ENCOUNTER — Other Ambulatory Visit: Payer: Self-pay | Admitting: Family Medicine

## 2023-10-20 NOTE — Telephone Encounter (Signed)
 Requested Prescriptions  Pending Prescriptions Disp Refills   losartan  (COZAAR ) 100 MG tablet [Pharmacy Med Name: LOSARTAN  TABS 100MG ] 90 tablet 3    Sig: TAKE 1 TABLET DAILY     Cardiovascular:  Angiotensin Receptor Blockers Passed - 10/20/2023  4:04 PM      Passed - Cr in normal range and within 180 days    Creatinine  Date Value Ref Range Status  10/24/2014 0.76 0.60 - 1.30 mg/dL Final   Creatinine, Ser  Date Value Ref Range Status  06/11/2023 0.80 0.57 - 1.00 mg/dL Final         Passed - K in normal range and within 180 days    Potassium  Date Value Ref Range Status  06/11/2023 3.6 3.5 - 5.2 mmol/L Final  10/24/2014 3.9 3.5 - 5.1 mmol/L Final         Passed - Patient is not pregnant      Passed - Last BP in normal range    BP Readings from Last 1 Encounters:  09/15/23 133/75         Passed - Valid encounter within last 6 months    Recent Outpatient Visits           1 month ago Other specified hypothyroidism   [place] [facility] [place], Megan P, DO   3 months ago Other specified hypothyroidism   [place] [facility] [place], [place], DO   4 months ago Vertigo   [place] [facility] [place], Megan P, DO   10 months ago Otalgia, unspecified laterality   [place] [facility] Melvin Pao, NP   11 months ago Dysuria   [place] [facility] [place], Duwaine SQUIBB, DO       Future Appointments             In 6 days Vicci Duwaine SQUIBB, DO  [facility], [facility]

## 2023-10-22 ENCOUNTER — Other Ambulatory Visit: Payer: Self-pay | Admitting: Family Medicine

## 2023-10-26 ENCOUNTER — Encounter: Payer: BC Managed Care – PPO | Admitting: Family Medicine

## 2023-10-26 NOTE — Telephone Encounter (Signed)
 Last reordered 09/02/23 #90 tablets   Requested Prescriptions  Refused Prescriptions Disp Refills   meloxicam  (MOBIC ) 7.5 MG tablet [Pharmacy Med Name: MELOXICAM  TABS 7.5MG ] 90 tablet 3    Sig: TAKE 1 TABLET DAILY     Analgesics:  COX2 Inhibitors Failed - 10/26/2023 10:03 AM      Failed - Manual Review: Labs are only required if the patient has taken medication for more than 8 weeks.      Failed - ALT in normal range and within 360 days    ALT  Date Value Ref Range Status  06/11/2023 38 (H) 0 - 32 IU/L Final         Passed - HGB in normal range and within 360 days    Hemoglobin  Date Value Ref Range Status  06/11/2023 15.0 11.1 - 15.9 g/dL Final  91/77/7975 84.5 11.1 - 15.9 g/dL Final         Passed - Cr in normal range and within 360 days    Creatinine  Date Value Ref Range Status  10/24/2014 0.76 0.60 - 1.30 mg/dL Final   Creatinine, Ser  Date Value Ref Range Status  06/11/2023 0.80 0.57 - 1.00 mg/dL Final         Passed - HCT in normal range and within 360 days    Hematocrit  Date Value Ref Range Status  06/11/2023 44.7 34.0 - 46.6 % Final  06/11/2023 43.2 34.0 - 46.6 % Final         Passed - AST in normal range and within 360 days    AST  Date Value Ref Range Status  06/11/2023 24 0 - 40 IU/L Final         Passed - eGFR is 30 or above and within 360 days    EGFR (African American)  Date Value Ref Range Status  10/24/2014 >60 >99mL/min Final   GFR calc Af Amer  Date Value Ref Range Status  02/28/2020 93 >59 mL/min/1.73 Final    Comment:    **[facility] currently reports eGFR in compliance with the current**   recommendations of the [employer]. [facility] will   update reporting as new guidelines are published from the NKF-ASN   Task force.    EGFR (Non-African Amer.)  Date Value Ref Range Status  10/24/2014 >60 >41mL/min Final    Comment:    eGFR values <49mL/min/1.73 m2 may be an indication of chronic kidney disease (CKD). Calculated  eGFR, using the MRDR Study equation, is useful in  patients with stable renal function. The eGFR calculation will not be reliable in acutely ill patients when serum creatinine is changing rapidly. It is not useful in patients on dialysis. The eGFR calculation may not be applicable to patients at the low and high extremes of body sizes, pregnant women, and vegetarians.    GFR calc non Af Amer  Date Value Ref Range Status  02/28/2020 80 >59 mL/min/1.73 Final   eGFR  Date Value Ref Range Status  06/11/2023 83 >59 mL/min/1.73 Final         Passed - Patient is not pregnant      Passed - Valid encounter within last 12 months    Recent Outpatient Visits           1 month ago Other specified hypothyroidism   [place] [facility] [place], Megan P, DO   3 months ago Other specified hypothyroidism   [place] [facility] [place], [place], DO  4 months ago Vertigo   [place] [facility] [place], Megan P, DO   10 months ago Otalgia, unspecified laterality   [place] [facility] Melvin Pao, NP   12 months ago Dysuria   [place] [facility] [place], Duwaine SQUIBB, DO       Future Appointments             Today Vicci Duwaine SQUIBB, DO [place], [facility]

## 2023-10-26 NOTE — Telephone Encounter (Signed)
 Pt has an appointment today @ 3:20 pm.

## 2023-10-27 ENCOUNTER — Encounter: Payer: BC Managed Care – PPO | Admitting: Family Medicine

## 2023-11-02 ENCOUNTER — Ambulatory Visit: Payer: Self-pay | Admitting: *Deleted

## 2023-11-02 NOTE — Telephone Encounter (Signed)
  Chief Complaint: flu like sx , exposed to flu approx 2 weeks ago , requesting Tamiflu  Symptoms: body aches, cough , sounds coarse per pt husband on DPR. Fever 100.6 at 2 pm . Runny nose, poor appetite. Husband reports patient has taken mucinex, robitussin cough medication at night and tylenol  for fever with little relief.   Frequency: yesterday  Pertinent Negatives: Patient denies chest pain no difficulty breathing no SOB Disposition: [] ED /[] Urgent Care (no appt availability in office) / [x] Appointment(In office/virtual)/ []  [place] [facility]/ [] Home Care/ [] Refused Recommended Disposition /[] [place] [facility]/ []  Follow-up with PCP Additional Notes:   Patient husband agreed to My chart VV scheduled for tomorrow with different provider [facility] / [facility] same day. Patient felt too bad today per husband. Recommended if fever continued to rise call back or go to UC /ED if greater than 101.        Reason for Disposition  [1] Fever > 100.0 F (37.8 C) AND [2] diabetes mellitus or weak immune system (e.g., HIV positive, cancer chemo, splenectomy, organ transplant, chronic steroids)  Answer Assessment - Initial Assessment Questions 1. WORST SYMPTOM: What is your worst symptom? (e.g., cough, runny nose, muscle aches, headache, sore throat, fever)      Body aches, fever, cough , runny nose 2. ONSET: When did your flu symptoms start?      Yesterday  3. COUGH: How bad is the cough?       Cough sounds Course per husband  4. RESPIRATORY DISTRESS: Describe your breathing.      Na  5. FEVER: Do you have a fever? If Yes, ask: What is your temperature, how was it measured, and when did it start?     Yes last checked for 100.6 at 2 pm  6. EXPOSURE: Were you exposed to someone with influenza?       Yes granddaughters approx 2 weeks ago  7. FLU VACCINE: Did you get a flu shot this year?     na 8. HIGH RISK DISEASE: Do you have any chronic medical problems? (e.g., heart or lung  disease, asthma, weak immune system, or other HIGH RISK conditions)     na 9. PREGNANCY: Is there any chance you are pregnant? When was your last menstrual period?     na 10. OTHER SYMPTOMS: Do you have any other symptoms?  (e.g., runny nose, muscle aches, headache, sore throat)       Runny nose body aches. Cough fever  Protocols used: Influenza - [facility]

## 2023-11-02 NOTE — Telephone Encounter (Signed)
 Summary: Pt having flu like symptoms and request Rx for Tamiflu    Pt spouse reports pt having flu like symptoms and would like to request Rx for Tamiflu . Offered to schedule virtual appt for today at 3:40 with Darice Petty but pt declined and stated that she does not want to talk with anyone. Pt spouse requests call back regarding request for Tamiflu .Cb# [phone]        Called patient's husband on DPR [phone] to review patient sx  and medication request. No answer, VM box full unable to leave message to call back.

## 2023-11-03 ENCOUNTER — Telehealth (INDEPENDENT_AMBULATORY_CARE_PROVIDER_SITE_OTHER): Payer: Medicare Other | Admitting: Physician Assistant

## 2023-11-03 DIAGNOSIS — R6889 Other general symptoms and signs: Secondary | ICD-10-CM | POA: Diagnosis not present

## 2023-11-03 MED ORDER — BENZONATATE 100 MG PO CAPS: 100.0000 mg | ORAL_CAPSULE | Freq: Two times a day (BID) | ORAL | 0 refills | Status: DC | PRN

## 2023-11-03 MED ORDER — OSELTAMIVIR PHOSPHATE 75 MG PO CAPS: 75.0000 mg | ORAL_CAPSULE | Freq: Two times a day (BID) | ORAL | 0 refills | Status: AC

## 2023-11-03 NOTE — Patient Instructions (Addendum)
 Based on your symptoms and your recent interactions with people who have tested positive for the Flu, we decided to treat you with Tamiflu  for likely influenza infection    VISIT SUMMARY:  You presented with a fever, cough, diarrhea, scratchy throat, nasal congestion, and body aches that began on Sunday night. You also reported headaches, dizziness, and heavy breathing. You had been in contact with your grandchildren who were diagnosed with the flu. Despite taking Tylenol , Mucinex, and Robitussin, your symptoms, especially the cough, seemed to be worsening. You were within the 72-hour window for taking Tamiflu  at the time of your visit.  YOUR PLAN:  -INFLUENZA: Influenza, commonly known as the flu, is a viral infection that affects your respiratory system. You have been prescribed oseltamivir , an antiviral medication that is most effective when started within the first 72 hours of symptom onset. This medication can help reduce the severity and duration of your symptoms. Continue taking acetaminophen  for fever, guaifenesin and dextromethorphan for your cough, and you have also been prescribed benzonatate  to help manage your persistent cough. Please monitor your symptoms and seek medical attention if you experience difficulty breathing, wheezing, increased fever, chills, severe nausea or vomiting, or diarrhea. If you experience severe difficulty breathing, go to the emergency room immediately.  INSTRUCTIONS:  Your prescription for oseltamivir  has been sent to [facility] in [place]. Continue taking your current medications as advised. Seek medical attention if you experience any worsening symptoms as discussed. Go to the emergency room if you experience severe difficulty breathing.  SABRA

## 2023-11-03 NOTE — Telephone Encounter (Signed)
 FYI: Patient has MyChart video appt 11-03-23

## 2023-11-03 NOTE — Progress Notes (Signed)
 Virtual Visit via Video Note  I connected with Emily [name] on 11/03/23 at 10:20 AM EST by a video enabled telemedicine application and verified that I am speaking with the correct person using two identifiers.  Today's Provider: [place], MHS, PA-C Introduced myself to the patient as a PA-C and provided education on APPs in clinical practice.   Location: Patient: At home  Provider: [facility], [place], [place]    I discussed the limitations of evaluation and management by telemedicine and the availability of in person appointments. The patient expressed understanding and agreed to proceed.  Chief Complaint  Patient presents with   Cough    OTC meds not helping   Generalized Body Aches    Exposed to flu 2 weeks ago. Sx started yesterday.    History of Present Illness:  Discussed the use of AI scribe software for clinical note transcription with the patient, who gave verbal consent to proceed.  History of Present Illness   The patient presented with a fever of approximately 100.4 degrees, a cough, and diarrhea that began on Sunday night. They also reported a scratchy throat, nasal congestion, and body aches. The patient described the body aches as the most severe symptom. The cough was persistent and the patient reported difficulty in managing it. They also reported heavy breathing but denied any wheezing. The patient experienced headaches and dizziness, and had an episode of diarrhea the previous night, but denied any nausea or vomiting.  The patient had been in contact with their grandchildren who were diagnosed with the flu during Christmas and after the New Year. The patient had been managing their symptoms with Tylenol , Mucinex, and Robitussin. Despite these medications, the patient reported that the cough seemed to be worsening.  The patient has previously taken Tamiflu  and was within the 72-hour window for its administration at the time of the consultation  today. They also requested a medication to manage their persistent cough.         Review of Systems  Constitutional:  Positive for chills, fever and malaise/fatigue.  HENT:  Positive for congestion, sinus pain and sore throat.   Respiratory:  Positive for cough. Negative for sputum production, shortness of breath and wheezing.   Gastrointestinal:  Positive for diarrhea. Negative for nausea and vomiting.  Musculoskeletal:  Positive for myalgias.  Neurological:  Positive for dizziness and headaches.      Observations/Objective:  Due to the nature of the virtual visit, physical exam and observations are limited. Able to obtain the following observations:   Alert, oriented, x3 Appears comfortable, in no acute distress.  No scleral injection, no appreciated hoarseness, tachypnea, wheeze or strider. Able to maintain conversation without visible strain.  cough appreciated during visit.    Assessment and Plan:  Problem List Items Addressed This Visit   None Visit Diagnoses       Flu-like symptoms    -  Primary   Relevant Medications   oseltamivir  (TAMIFLU ) 75 MG capsule   benzonatate  (TESSALON ) 100 MG capsule      Assessment and Plan     Acute onset of fever, cough, sore throat, nasal congestion, body aches, headache, and diarrhea starting Sunday night. Exposure to grandchildren diagnosed with influenza. Symptoms consistent with influenza, especially given recent contact with confirmed cases. Patient is within the 72-hour window for oseltamivir  initiation. Discussed that oseltamivir  is an antiviral medication, not an antibiotic, and is most effective when started within the first 72 hours of symptom onset.  Explained that oseltamivir  can help reduce the severity and duration of symptoms, with many patients experiencing relief/reduction in symptoms within 1-2 days. Advised to continue current medications (acetaminophen , guaifenesin, dextromethorphan) and to monitor for worsening  symptoms. - Prescribe oseltamivir  - Continue acetaminophen  for fever - Continue guaifenesin and dextromethorphan for cough - Prescribe benzonatate  for cough - Provide after-visit summary with instructions - Advise to seek medical attention if experiencing dyspnea, wheezing, increased fever, chills, severe nausea or vomiting, or diarrhea - Advise to go to the emergency room if experiencing severe dyspnea.       Follow Up Instructions:    I discussed the assessment and treatment plan with the patient. The patient was provided an opportunity to ask questions and all were answered. The patient agreed with the plan and demonstrated an understanding of the instructions.   The patient was advised to call back or seek an in-person evaluation if the symptoms worsen or if the condition fails to improve as anticipated.  I provided 8 minutes of non-face-to-face time during this encounter.  No follow-ups on file.   I, Anush Wiedeman E Weiland Tomich, PA-C, have reviewed all documentation for this visit. The documentation on 11/03/23 for the exam, diagnosis, procedures, and orders are all accurate and complete.   [place], MHS, PA-C [facility]

## 2023-11-13 ENCOUNTER — Ambulatory Visit (INDEPENDENT_AMBULATORY_CARE_PROVIDER_SITE_OTHER): Payer: Medicare Other | Admitting: Family Medicine

## 2023-11-13 ENCOUNTER — Encounter: Payer: Self-pay | Admitting: Family Medicine

## 2023-11-13 ENCOUNTER — Other Ambulatory Visit: Payer: Self-pay | Admitting: Family Medicine

## 2023-11-13 VITALS — BP 156/90 | HR 87 | Temp 98.7°F | Ht 63.0 in | Wt 222.8 lb

## 2023-11-13 DIAGNOSIS — R10819 Abdominal tenderness, unspecified site: Secondary | ICD-10-CM

## 2023-11-13 DIAGNOSIS — F339 Major depressive disorder, recurrent, unspecified: Secondary | ICD-10-CM

## 2023-11-13 DIAGNOSIS — E559 Vitamin D deficiency, unspecified: Secondary | ICD-10-CM

## 2023-11-13 DIAGNOSIS — I10 Essential (primary) hypertension: Secondary | ICD-10-CM | POA: Diagnosis not present

## 2023-11-13 DIAGNOSIS — R232 Flushing: Secondary | ICD-10-CM | POA: Diagnosis not present

## 2023-11-13 DIAGNOSIS — Z0000 Encounter for general adult medical examination without abnormal findings: Secondary | ICD-10-CM

## 2023-11-13 DIAGNOSIS — E038 Other specified hypothyroidism: Secondary | ICD-10-CM | POA: Diagnosis not present

## 2023-11-13 DIAGNOSIS — R7303 Prediabetes: Secondary | ICD-10-CM | POA: Diagnosis not present

## 2023-11-13 DIAGNOSIS — E782 Mixed hyperlipidemia: Secondary | ICD-10-CM | POA: Diagnosis not present

## 2023-11-13 DIAGNOSIS — R632 Polyphagia: Secondary | ICD-10-CM

## 2023-11-13 DIAGNOSIS — R8281 Pyuria: Secondary | ICD-10-CM

## 2023-11-13 LAB — URINALYSIS, ROUTINE W REFLEX MICROSCOPIC
Bilirubin, UA: NEGATIVE
Glucose, UA: NEGATIVE
Ketones, UA: NEGATIVE
Nitrite, UA: NEGATIVE
Protein,UA: NEGATIVE
RBC, UA: NEGATIVE
Specific Gravity, UA: >1.03 — ABNORMAL HIGH (ref 1.005–1.030)
Urobilinogen, Ur: 0.2 mg/dL (ref 0.2–1.0)
pH, UA: 5.5 (ref 5.0–7.5)

## 2023-11-13 LAB — MICROALBUMIN, URINE WAIVED
Creatinine, Urine Waived: 300 mg/dL (ref 10–300)
Microalb, Ur Waived: 80 mg/L — ABNORMAL HIGH (ref 0–19)

## 2023-11-13 LAB — MICROSCOPIC EXAMINATION: RBC, Urine: NONE SEEN /[HPF] (ref 0–2)

## 2023-11-13 LAB — BAYER DCA HB A1C WAIVED: HB A1C (BAYER DCA - WAIVED): 5.8 % — ABNORMAL HIGH (ref 4.8–5.6)

## 2023-11-13 MED ORDER — HYDROCHLOROTHIAZIDE 25 MG PO TABS: 25.0000 mg | ORAL_TABLET | Freq: Every day | ORAL | 1 refills | Status: DC

## 2023-11-13 MED ORDER — TRULICITY 0.75 MG/0.5ML ~~LOC~~ SOAJ: 0.7500 mg | SUBCUTANEOUS | 2 refills | Status: DC

## 2023-11-13 MED ORDER — LOSARTAN POTASSIUM 100 MG PO TABS: 100.0000 mg | ORAL_TABLET | Freq: Every day | ORAL | 1 refills | Status: DC

## 2023-11-13 MED ORDER — MELOXICAM 7.5 MG PO TABS: 7.5000 mg | ORAL_TABLET | Freq: Every day | ORAL | 1 refills | Status: DC

## 2023-11-13 MED ORDER — VENLAFAXINE HCL ER 150 MG PO CP24: 150.0000 mg | ORAL_CAPSULE | Freq: Every day | ORAL | 1 refills | Status: DC

## 2023-11-13 MED ORDER — AMLODIPINE BESYLATE 2.5 MG PO TABS: 2.5000 mg | ORAL_TABLET | Freq: Every day | ORAL | 2 refills | Status: DC

## 2023-11-13 NOTE — Assessment & Plan Note (Signed)
Will continue diet and exercise. Will add back in trulicity. Recheck 6-8 weeks. Call with any concerns.

## 2023-11-13 NOTE — Assessment & Plan Note (Signed)
Running high. Will start her on amlodipine and recheck in about 6 weeks. Call with any concerns. Labs drawn today.

## 2023-11-13 NOTE — Assessment & Plan Note (Signed)
Rechecking labs today. Await results. Treat as needed.

## 2023-11-13 NOTE — Assessment & Plan Note (Signed)
Doing well with A1c of 5.8. Will continue diet and exercise. Will add back in trulicity. Recheck 6-8 weeks. Call with any concerns.

## 2023-11-13 NOTE — Progress Notes (Signed)
BP (!) 156/90   Pulse 87   Temp 98.7 F (37.1 C) (Oral)   Ht 5\' 3"  (1.6 m)   Wt 222 lb 12.8 oz (101.1 kg)   BMI 39.47 kg/m    Subjective:    Patient ID: Emily [name], female    DOB: 1961/08/16, 63 y.o.   MRN: [id number]  HPI: Emily [name] is a 63 y.o. female presenting on 11/13/2023 for comprehensive medical examination. Current medical complaints include:  Impaired Fasting Glucose- last took trulicity in October 2024 HbA1C:  Lab Results  Component Value Date   HGBA1C 5.7 (H) 06/11/2023   Duration of elevated blood sugar: chronic Polydipsia: yes Polyuria: yes Weight change: yes Visual disturbance: no Glucose Monitoring: no Diabetic Education: Completed Family history of diabetes: yes  HYPOTHYROIDISM Thyroid control status:stable Satisfied with current treatment? yes Medication side effects: no Medication compliance: excellent compliance Recent dose adjustment:no Fatigue: yes Cold intolerance: no Heat intolerance: no Weight gain: yes Weight loss: no Constipation: no Diarrhea/loose stools: no Palpitations: no Lower extremity edema: no Anxiety/depressed mood: yes  HYPERTENSION / HYPERLIPIDEMIA Satisfied with current treatment? no Duration of hypertension: chronic BP monitoring frequency: not checking BP medication side effects: no Past BP meds: losartan, hydrochlorothiazide Duration of hyperlipidemia: chronic Cholesterol medication side effects: no Cholesterol supplements: none Past cholesterol medications: none Medication compliance: excellent compliance Aspirin: no Recent stressors: no Recurrent headaches: no Visual changes: no Palpitations: no Dyspnea: no Chest pain: no Lower extremity edema: no Dizzy/lightheaded: no  ANXIETY/DEPRESSION Duration: chronic Status:controlled Anxious mood: no  Excessive worrying: no Irritability: no  Sweating: no Nausea: no Palpitations:no Hyperventilation: no Panic attacks: no Agoraphobia: no   Obscessions/compulsions: no Depressed mood: no    11/13/2023   10:04 AM 09/15/2023    3:27 PM 07/10/2023    2:26 PM 06/11/2023    2:23 PM 05/26/2023   12:54 PM  Depression screen PHQ 2/9  Decreased Interest 1 0 0 1 0  Down, Depressed, Hopeless 0 0 1 0 1  PHQ - 2 Score 1 0 1 1 1   Altered sleeping 1 1 1 3  0  Tired, decreased energy 1 3 1  0 1  Change in appetite 1 1 1  0 0  Feeling bad or failure about yourself  0 0 0 0 0  Trouble concentrating 1 1 1  0 0  Moving slowly or fidgety/restless 0 0 0 0 0  Suicidal thoughts 0 0 0 0 0  PHQ-9 Score 5 6 5 4 2   Difficult doing work/chores  Not difficult at all Not difficult at all Not difficult at all Not difficult at all   Anhedonia: no Weight changes: no Insomnia: no   Hypersomnia: no Fatigue/loss of energy: no Feelings of worthlessness: no Feelings of guilt: no Impaired concentration/indecisiveness: no Suicidal ideations: no  Crying spells: no Recent Stressors/Life Changes: no   Relationship problems: no   Family stress: no     Financial stress: no    Job stress: no    Recent death/loss: no   She currently lives with: husband Menopausal Symptoms: no  Depression Screen done today and results listed below:     11/13/2023   10:04 AM 09/15/2023    3:27 PM 07/10/2023    2:26 PM 06/11/2023    2:23 PM 05/26/2023   12:54 PM  Depression screen PHQ 2/9  Decreased Interest 1 0 0 1 0  Down, Depressed, Hopeless 0 0 1 0 1  PHQ - 2 Score 1 0 1 1 1   Altered  sleeping 1 1 1 3  0  Tired, decreased energy 1 3 1  0 1  Change in appetite 1 1 1  0 0  Feeling bad or failure about yourself  0 0 0 0 0  Trouble concentrating 1 1 1  0 0  Moving slowly or fidgety/restless 0 0 0 0 0  Suicidal thoughts 0 0 0 0 0  PHQ-9 Score 5 6 5 4 2   Difficult doing work/chores  Not difficult at all Not difficult at all Not difficult at all Not difficult at all     Past Medical History:  Past Medical History:  Diagnosis Date   Anxiety    Back pain    Chest pain     a. 10/2013 Neg ETT; b. 10/2015 Refused MV->Cath: nl cors, nl EF.   Constipation    Degenerative disc disease, lumbar    Depression    Essential hypertension    Fatigue    GERD (gastroesophageal reflux disease)    Hypothyroidism    Joint pain in fingers of both hands    Morbid obesity (HCC)    Myalgia    Neck pain    SOB (shortness of breath)     Surgical History:  Past Surgical History:  Procedure Laterality Date   CARDIAC CATHETERIZATION N/A 11/14/2015   Procedure: Left Heart Cath and Coronary Angiography;  Surgeon: Iran Ouch, MD;  Location: [facility];  Service: Cardiovascular;  Laterality: N/A;   CARPAL TUNNEL RELEASE Left 07/03/2015   Procedure: CARPAL TUNNEL RELEASE;  Surgeon: Myra Rude, MD;  Location: [facility] ORS;  Service: Orthopedics;  Laterality: Left;   CARPAL TUNNEL RELEASE Right 09/07/2015   Procedure: CARPAL TUNNEL RELEASE;  Surgeon: Myra Rude, MD;  Location: [facility] ORS;  Service: Orthopedics;  Laterality: Right;   CHOLECYSTECTOMY     COLONOSCOPY WITH PROPOFOL N/A 05/30/2021   Procedure: COLONOSCOPY WITH PROPOFOL;  Surgeon: Pasty Spillers, MD;  Location: [facility] ENDOSCOPY;  Service: Endoscopy;  Laterality: N/A;   KNEE SURGERY Right    TUBAL LIGATION      Medications:  Current Outpatient Medications on File Prior to Visit  Medication Sig   meclizine (ANTIVERT) 25 MG tablet Take 1 tablet (25 mg total) by mouth 3 (three) times daily as needed for dizziness.   triamcinolone ointment (KENALOG) 0.5 % Apply 1 Application topically 2 (two) times daily.   No current facility-administered medications on file prior to visit.    Allergies:  No Known Allergies  Social History:  Social History   Socioeconomic History   Marital status: Married    Spouse name: Tinnie Gens   Number of children: Not on file   Years of education: Not on file   Highest education level: Not on file  Occupational History   Occupation: retired from [employer] 30  yrs of service  Tobacco Use   Smoking status: Former    Current packs/day: 0.00    Average packs/day: 0.3 packs/day for 10.0 years (2.5 ttl pk-yrs)    Types: Cigarettes    Start date: 07/01/1985    Quit date: 07/02/1995    Years since quitting: 28.3   Smokeless tobacco: Never  Vaping Use   Vaping status: Never Used  Substance and Sexual Activity   Alcohol use: No    Alcohol/week: 0.0 standard drinks of alcohol   Drug use: No   Sexual activity: Yes    Partners: Male  Other Topics Concern   Not on file  Social History Narrative   Married to [place],  has 1 daughter and 1 son and 1 step-daughter and 1 step-son   Social Drivers of Corporate investment banker Strain: Low Risk  (05/26/2023)   Overall Financial Resource Strain (CARDIA)    Difficulty of Paying Living Expenses: Not hard at all  Food Insecurity: No Food Insecurity (05/26/2023)   Hunger Vital Sign    Worried About Running Out of Food in the Last Year: Never true    Ran Out of Food in the Last Year: Never true  Transportation Needs: No Transportation Needs (05/26/2023)   PRAPARE - Administrator, Civil Service (Medical): No    Lack of Transportation (Non-Medical): No  Physical Activity: Inactive (05/26/2023)   Exercise Vital Sign    Days of Exercise per Week: 0 days    Minutes of Exercise per Session: 0 min  Stress: No Stress Concern Present (05/26/2023)   [employer] of Occupational Health - Occupational Stress Questionnaire    Feeling of Stress : Only a little  Social Connections: Socially Integrated (05/26/2023)   Social Connection and Isolation Panel [NHANES]    Frequency of Communication with Friends and Family: Three times a week    Frequency of Social Gatherings with Friends and Family: Patient declined    Attends Religious Services: More than 4 times per year    Active Member of [employer] or Organizations: Yes    Attends Engineer, structural: More than 4 times per year    Marital Status: Married   Catering manager Violence: Not At Risk (05/26/2023)   Humiliation, Afraid, Rape, and Kick questionnaire    Fear of Current or Ex-Partner: No    Emotionally Abused: No    Physically Abused: No    Sexually Abused: No   Social History   Tobacco Use  Smoking Status Former   Current packs/day: 0.00   Average packs/day: 0.3 packs/day for 10.0 years (2.5 ttl pk-yrs)   Types: Cigarettes   Start date: 07/01/1985   Quit date: 07/02/1995   Years since quitting: 28.3  Smokeless Tobacco Never   Social History   Substance and Sexual Activity  Alcohol Use No   Alcohol/week: 0.0 standard drinks of alcohol    Family History:  Family History  Problem Relation Age of Onset   Stroke Mother    Heart failure Mother    Heart disease Mother    Hypertension Mother    Thyroid disease Mother    Depression Mother    Anxiety disorder Mother    Obesity Mother    Heart attack Father 65   Hypertension Father    Hyperlipidemia Father    Alzheimer's disease Father    Heart disease Father    Obesity Father    Cancer Paternal Uncle        prostate   Diabetes Maternal Grandmother    Diabetes Paternal Grandmother    CAD Cousin 2       passed   Breast cancer Neg Hx     Past medical history, surgical history, medications, allergies, family history and social history reviewed with patient today and changes made to appropriate areas of the chart.   Review of Systems  Constitutional:  Positive for diaphoresis (mainly at night but also during the day) and malaise/fatigue. Negative for chills, fever and weight loss.  HENT: Negative.    Eyes: Negative.   Respiratory: Negative.    Cardiovascular:  Positive for palpitations. Negative for chest pain, orthopnea, claudication, leg swelling and PND.  Gastrointestinal: Negative.  Genitourinary:  Positive for dysuria. Negative for flank pain, frequency, hematuria and urgency.  Musculoskeletal:  Positive for myalgias. Negative for back pain, falls, joint pain  and neck pain.  Skin: Negative.   Neurological:  Positive for tingling. Negative for dizziness, tremors, sensory change, speech change, focal weakness, seizures, loss of consciousness, weakness and headaches.  Endo/Heme/Allergies: Negative.   Psychiatric/Behavioral: Negative.     All other ROS negative except what is listed above and in the HPI.      Objective:    BP (!) 156/90   Pulse 87   Temp 98.7 F (37.1 C) (Oral)   Ht 5\' 3"  (1.6 m)   Wt 222 lb 12.8 oz (101.1 kg)   BMI 39.47 kg/m   Wt Readings from Last 3 Encounters:  11/13/23 222 lb 12.8 oz (101.1 kg)  09/15/23 212 lb (96.2 kg)  07/10/23 210 lb 3.2 oz (95.3 kg)    Physical Exam Vitals and nursing note reviewed.  Constitutional:      General: She is not in acute distress.    Appearance: Normal appearance. She is obese. She is diaphoretic. She is not ill-appearing or toxic-appearing.  HENT:     Head: Normocephalic and atraumatic.     Right Ear: Tympanic membrane, ear canal and external ear normal. There is no impacted cerumen.     Left Ear: Tympanic membrane, ear canal and external ear normal. There is no impacted cerumen.     Nose: Nose normal. No congestion or rhinorrhea.     Mouth/Throat:     Mouth: Mucous membranes are moist.     Pharynx: Oropharynx is clear. No oropharyngeal exudate or posterior oropharyngeal erythema.  Eyes:     General: No scleral icterus.       Right eye: No discharge.        Left eye: No discharge.     Extraocular Movements: Extraocular movements intact.     Conjunctiva/sclera: Conjunctivae normal.     Pupils: Pupils are equal, round, and reactive to light.  Neck:     Vascular: No carotid bruit.  Cardiovascular:     Rate and Rhythm: Normal rate and regular rhythm.     Pulses: Normal pulses.     Heart sounds: No murmur heard.    No friction rub. No gallop.  Pulmonary:     Effort: Pulmonary effort is normal. No respiratory distress.     Breath sounds: Normal breath sounds. No stridor.  No wheezing, rhonchi or rales.  Chest:     Chest wall: No tenderness.  Abdominal:     General: Abdomen is flat. Bowel sounds are normal. There is no distension.     Palpations: Abdomen is soft. There is no mass.     Tenderness: There is no abdominal tenderness. There is no right CVA tenderness, left CVA tenderness, guarding or rebound.     Hernia: No hernia is present.  Genitourinary:    Comments: Breast and pelvic exams deferred with shared decision making Musculoskeletal:        General: No swelling, tenderness, deformity or signs of injury.     Cervical back: Normal range of motion and neck supple. No rigidity. No muscular tenderness.     Right lower leg: No edema.     Left lower leg: No edema.  Lymphadenopathy:     Cervical: No cervical adenopathy.  Skin:    General: Skin is warm.     Capillary Refill: Capillary refill takes less than 2 seconds.  Coloration: Skin is not jaundiced or pale.     Findings: No bruising, erythema, lesion or rash.  Neurological:     General: No focal deficit present.     Mental Status: She is alert and oriented to person, place, and time. Mental status is at baseline.     Cranial Nerves: No cranial nerve deficit.     Sensory: No sensory deficit.     Motor: No weakness.     Coordination: Coordination normal.     Gait: Gait normal.     Deep Tendon Reflexes: Reflexes normal.  Psychiatric:        Mood and Affect: Mood normal.        Behavior: Behavior normal.        Thought Content: Thought content normal.        Judgment: Judgment normal.     Results for orders placed or performed in visit on 09/15/23  TSH   Collection Time: 09/15/23  3:52 PM  Result Value Ref Range   TSH 2.850 0.450 - 4.500 uIU/mL      Assessment & Plan:   Problem List Items Addressed This Visit       Cardiovascular and Mediastinum   Essential hypertension   Running high. Will start her on amlodipine and recheck in about 6 weeks. Call with any concerns. Labs drawn  today.       Relevant Medications   amLODipine (NORVASC) 2.5 MG tablet   hydrochlorothiazide (HYDRODIURIL) 25 MG tablet   losartan (COZAAR) 100 MG tablet   Other Relevant Orders   Microalbumin, Urine Waived   CBC with Differential/Platelet   Comprehensive metabolic panel     Endocrine   Hypothyroidism   Rechecking labs today. Await results. Treat as needed.       Relevant Orders   CBC with Differential/Platelet   Comprehensive metabolic panel   TSH     Other   Depression, recurrent (HCC)   Under good control on current regimen. Continue current regimen. Continue to monitor. Call with any concerns. Refills given.        Relevant Medications   venlafaxine XR (EFFEXOR-XR) 150 MG 24 hr capsule   Vitamin D deficiency   Rechecking labs today. Await results. Treat as needed.       Relevant Orders   CBC with Differential/Platelet   Comprehensive metabolic panel   VITAMIN D 25 Hydroxy (Vit-D Deficiency, Fractures)   Pre-diabetes   Doing well with A1c of 5.8. Will continue diet and exercise. Will add back in trulicity. Recheck 6-8 weeks. Call with any concerns.       Relevant Orders   Bayer DCA Hb A1c Waived   CBC with Differential/Platelet   Comprehensive metabolic panel   Lipid Panel w/o Chol/HDL Ratio   Hyperlipidemia   Under good control on current regimen. Continue current regimen. Continue to monitor. Call with any concerns. Refills given. Labs drawn today.        Relevant Medications   amLODipine (NORVASC) 2.5 MG tablet   hydrochlorothiazide (HYDRODIURIL) 25 MG tablet   losartan (COZAAR) 100 MG tablet   Polyphagia   Will continue diet and exercise. Will add back in trulicity. Recheck 6-8 weeks. Call with any concerns.       Relevant Medications   Dulaglutide (TRULICITY) 0.75 MG/0.5ML SOAJ   Other Visit Diagnoses       Routine general medical examination at a health care facility    -  Primary   Vaccines up to date. Screening labs checked  today. Pap up to  date. Mammo ordered. Colonoscopy up to date. Continue diet and exercise. Call with any concerns.     Hot flashes       Will check labs. Await results. Treat as needed.   Relevant Medications   amLODipine (NORVASC) 2.5 MG tablet   hydrochlorothiazide (HYDRODIURIL) 25 MG tablet   losartan (COZAAR) 100 MG tablet   Other Relevant Orders   CBC with Differential/Platelet   Comprehensive metabolic panel   LH   FSH   Estradiol     Suprapubic tenderness       + pyuria. Will check culture. Call with any concerns.   Relevant Orders   CBC with Differential/Platelet   Comprehensive metabolic panel   Urinalysis, Routine w reflex microscopic     Pyuria       Will check culture. Await results.   Relevant Orders   Urine Culture        Follow up plan: Return in about 6 weeks (around 12/25/2023).   LABORATORY TESTING:  - Pap smear: not applicable  IMMUNIZATIONS:   - Tdap: Tetanus vaccination status reviewed: last tetanus booster within 10 years. - Influenza: Refused - Pneumovax: Not applicable - Prevnar: Not applicable - COVID: Refused - HPV: Not applicable - Shingrix vaccine: Up to date  SCREENING: -Mammogram: Ordered today  - Colonoscopy: Up to date  - Bone Density: Up to date   PATIENT COUNSELING:   Advised to take 1 mg of folate supplement per day if capable of pregnancy.   Sexuality: Discussed sexually transmitted diseases, partner selection, use of condoms, avoidance of unintended pregnancy  and contraceptive alternatives.   Advised to avoid cigarette smoking.  I discussed with the patient that most people either abstain from alcohol or drink within safe limits (<=14/week and <=4 drinks/occasion for males, <=7/weeks and <= 3 drinks/occasion for females) and that the risk for alcohol disorders and other health effects rises proportionally with the number of drinks per week and how often a drinker exceeds daily limits.  Discussed cessation/primary prevention of drug use and  availability of treatment for abuse.   Diet: Encouraged to adjust caloric intake to maintain  or achieve ideal body weight, to reduce intake of dietary saturated fat and total fat, to limit sodium intake by avoiding high sodium foods and not adding table salt, and to maintain adequate dietary potassium and calcium preferably from fresh fruits, vegetables, and low-fat dairy products.    stressed the importance of regular exercise  Injury prevention: Discussed safety belts, safety helmets, smoke detector, smoking near bedding or upholstery.   Dental health: Discussed importance of regular tooth brushing, flossing, and dental visits.    NEXT PREVENTATIVE PHYSICAL DUE IN 1 YEAR. Return in about 6 weeks (around 12/25/2023).

## 2023-11-13 NOTE — Telephone Encounter (Signed)
Requested Prescriptions  Pending Prescriptions Disp Refills   levothyroxine (SYNTHROID) 125 MCG tablet [Pharmacy Med Name: L-THYROXINE (SYNTHROID) TABS 125MCG] 90 tablet 1    Sig: TAKE 1 TABLET DAILY BEFORE BREAKFAST     Endocrinology:  Hypothyroid Agents Passed - 11/13/2023  1:04 PM      Passed - TSH in normal range and within 360 days    TSH  Date Value Ref Range Status  09/15/2023 2.850 0.450 - 4.500 uIU/mL Final         Passed - Valid encounter within last 12 months    Recent Outpatient Visits           Today Other specified hypothyroidism   [place] [facility] [place], Megan P, DO   1 week ago Flu-like symptoms   [place] [facility] Mecum, Oswaldo Conroy, PA-C   1 month ago Other specified hypothyroidism   [place] [facility] [place], [place], DO   4 months ago Other specified hypothyroidism   [place] [facility] [place], [place], DO   5 months ago Vertigo   [place] [facility] [place], Oralia Rud, DO       Future Appointments             In 1 month Johnson, Oralia Rud, DO [place] [facility]

## 2023-11-13 NOTE — Patient Instructions (Signed)
Please call to schedule your mammogram and/or bone density: [facility]  Address: [address], [place], [place] [address] Phone: [phone]   Imaging at [facility] [address] [place],  [place]  [address] Phone: [phone]

## 2023-11-13 NOTE — Assessment & Plan Note (Signed)
Under good control on current regimen. Continue current regimen. Continue to monitor. Call with any concerns. Refills given.

## 2023-11-13 NOTE — Assessment & Plan Note (Signed)
Under good control on current regimen. Continue current regimen. Continue to monitor. Call with any concerns. Refills given. Labs drawn today.

## 2023-11-14 ENCOUNTER — Other Ambulatory Visit: Payer: Self-pay | Admitting: Nurse Practitioner

## 2023-11-14 DIAGNOSIS — E559 Vitamin D deficiency, unspecified: Secondary | ICD-10-CM

## 2023-11-14 LAB — LIPID PANEL W/O CHOL/HDL RATIO
Cholesterol, Total: 259 mg/dL — ABNORMAL HIGH (ref 100–199)
HDL: 43 mg/dL (ref >39)
LDL Chol Calc (NIH): 154 mg/dL — ABNORMAL HIGH (ref 0–99)
Triglycerides: 335 mg/dL — ABNORMAL HIGH (ref 0–149)
VLDL Cholesterol Cal: 62 mg/dL — ABNORMAL HIGH (ref 5–40)

## 2023-11-14 LAB — CBC WITH DIFFERENTIAL/PLATELET
Basophils Absolute: 0.1 10*3/uL (ref 0.0–0.2)
Basos: 1 %
EOS (ABSOLUTE): 0.3 10*3/uL (ref 0.0–0.4)
Eos: 4 %
Hematocrit: 42.9 % (ref 34.0–46.6)
Hemoglobin: 13.8 g/dL (ref 11.1–15.9)
Immature Grans (Abs): 0 10*3/uL (ref 0.0–0.1)
Immature Granulocytes: 0 %
Lymphocytes Absolute: 3.5 10*3/uL — ABNORMAL HIGH (ref 0.7–3.1)
Lymphs: 40 %
MCH: 30.1 pg (ref 26.6–33.0)
MCHC: 32.2 g/dL (ref 31.5–35.7)
MCV: 94 fL (ref 79–97)
Monocytes Absolute: 0.6 10*3/uL (ref 0.1–0.9)
Monocytes: 7 %
Neutrophils Absolute: 4.2 10*3/uL (ref 1.4–7.0)
Neutrophils: 48 %
Platelets: 317 10*3/uL (ref 150–450)
RBC: 4.58 x10E6/uL (ref 3.77–5.28)
RDW: 12.6 % (ref 11.7–15.4)
WBC: 8.7 10*3/uL (ref 3.4–10.8)

## 2023-11-14 LAB — COMPREHENSIVE METABOLIC PANEL
ALT: 24 [IU]/L (ref 0–32)
AST: 20 [IU]/L (ref 0–40)
Albumin: 4.5 g/dL (ref 3.9–4.9)
Alkaline Phosphatase: 91 [IU]/L (ref 44–121)
BUN/Creatinine Ratio: 30 — ABNORMAL HIGH (ref 12–28)
BUN: 21 mg/dL (ref 8–27)
Bilirubin Total: 0.3 mg/dL (ref 0.0–1.2)
CO2: 24 mmol/L (ref 20–29)
Calcium: 9.8 mg/dL (ref 8.7–10.3)
Chloride: 99 mmol/L (ref 96–106)
Creatinine, Ser: 0.71 mg/dL (ref 0.57–1.00)
Globulin, Total: 3.1 g/dL (ref 1.5–4.5)
Glucose: 104 mg/dL — ABNORMAL HIGH (ref 70–99)
Potassium: 4.1 mmol/L (ref 3.5–5.2)
Sodium: 139 mmol/L (ref 134–144)
Total Protein: 7.6 g/dL (ref 6.0–8.5)
eGFR: 95 mL/min/{1.73_m2} (ref >59)

## 2023-11-14 LAB — FOLLICLE STIMULATING HORMONE: FSH: 31.4 m[IU]/mL (ref 25.8–134.8)

## 2023-11-14 LAB — ESTRADIOL: Estradiol: <5 pg/mL (ref 0.0–54.7)

## 2023-11-14 LAB — VITAMIN D 25 HYDROXY (VIT D DEFICIENCY, FRACTURES): Vit D, 25-Hydroxy: 23.3 ng/mL — ABNORMAL LOW (ref 30.0–100.0)

## 2023-11-14 LAB — TSH: TSH: 5.55 u[IU]/mL — ABNORMAL HIGH (ref 0.450–4.500)

## 2023-11-14 LAB — LUTEINIZING HORMONE: LH: 17.5 m[IU]/mL (ref 7.7–58.5)

## 2023-11-15 ENCOUNTER — Encounter: Payer: Self-pay | Admitting: Family Medicine

## 2023-11-15 ENCOUNTER — Other Ambulatory Visit: Payer: Self-pay | Admitting: Family Medicine

## 2023-11-15 DIAGNOSIS — E038 Other specified hypothyroidism: Secondary | ICD-10-CM

## 2023-11-15 MED ORDER — VITAMIN D (ERGOCALCIFEROL) 1.25 MG (50000 UNIT) PO CAPS: 50000.0000 [IU] | ORAL_CAPSULE | ORAL | 1 refills | Status: AC

## 2023-11-15 MED ORDER — LEVOTHYROXINE SODIUM 137 MCG PO TABS: 137.0000 ug | ORAL_TABLET | Freq: Every day | ORAL | 1 refills | Status: DC

## 2023-11-16 ENCOUNTER — Other Ambulatory Visit: Payer: Self-pay | Admitting: Family Medicine

## 2023-11-16 DIAGNOSIS — E038 Other specified hypothyroidism: Secondary | ICD-10-CM

## 2023-11-16 LAB — URINE CULTURE

## 2023-11-18 NOTE — Telephone Encounter (Signed)
Requested medication (s) are due for refill today- no  Requested medication (s) are on the active medication list -yes  Future visit scheduled -yes  Last refill: 11/15/23 #30 1RF  Notes to clinic: Request for 90 day supply- change in dosage Rx- does not include a 90 day supply- needs lab recheck 6 weeks  Requested Prescriptions  Pending Prescriptions Disp Refills   levothyroxine (SYNTHROID) 137 MCG tablet [Pharmacy Med Name: LEVOTHYROXINE 0.137MG  ( ) TAB] 90 tablet     Sig: TAKE 1 TABLET(137 MCG) BY MOUTH DAILY BEFORE BREAKFAST     Endocrinology:  Hypothyroid Agents Failed - 11/18/2023 11:19 AM      Failed - TSH in normal range and within 360 days    TSH  Date Value Ref Range Status  11/13/2023 5.550 (H) 0.450 - 4.500 uIU/mL Final         Passed - Valid encounter within last 12 months    Recent Outpatient Visits           5 days ago Routine general medical examination at a health care facility   [facility] [place], [place] P, DO   2 weeks ago Flu-like symptoms   [place] [facility] Mecum, Oswaldo Conroy, PA-C   2 months ago Other specified hypothyroidism   [place] [facility] [place], [place], DO   4 months ago Other specified hypothyroidism   [place] [facility] [place], [place], DO   5 months ago Vertigo   [place] [facility] [place], [place], DO       Future Appointments             In 1 month Johnson, Oralia Rud, DO [place] [facility]               Requested Prescriptions  Pending Prescriptions Disp Refills   levothyroxine (SYNTHROID) 137 MCG tablet [Pharmacy Med Name: LEVOTHYROXINE 0.137MG  ( ) TAB] 90 tablet     Sig: TAKE 1 TABLET(137 MCG) BY MOUTH DAILY BEFORE BREAKFAST     Endocrinology:  Hypothyroid Agents Failed - 11/18/2023 11:19 AM      Failed - TSH in normal range and within 360 days    TSH  Date Value Ref Range Status  11/13/2023  5.550 (H) 0.450 - 4.500 uIU/mL Final         Passed - Valid encounter within last 12 months    Recent Outpatient Visits           5 days ago Routine general medical examination at a health care facility   [facility] [place], [place] P, DO   2 weeks ago Flu-like symptoms   [place] [facility] Mecum, Oswaldo Conroy, PA-C   2 months ago Other specified hypothyroidism   [place] [facility] [place], [place], DO   4 months ago Other specified hypothyroidism   [place] [facility] [place], [place], DO   5 months ago Vertigo   [place] [facility] [place], Oralia Rud, DO       Future Appointments             In 1 month Johnson, Oralia Rud, DO [place] [facility]

## 2023-11-24 NOTE — Telephone Encounter (Signed)
-----   Message from Duwaine Louder sent at 11/15/2023  8:07 PM EST ----- Patient notified of results  by mychart  Everything looks mostly nice and normal. Your vitamin D  is low and your thyroid  is underactive. I've sent you in a supplement for your vitamin D  and I'm going to increase your thyroid  medicine to 137mcg and we'll recheck it in 6 weeks. Everything else is looking good. You are in the post-menopausal range on your hormones. Have a great day!  Please get her scheduled for lab only visit in 6 weeks. (Order in)

## 2023-11-24 NOTE — Telephone Encounter (Signed)
 This encounter was created in error - please disregard.

## 2023-11-27 ENCOUNTER — Telehealth: Payer: Self-pay | Admitting: Family Medicine

## 2023-11-27 NOTE — Telephone Encounter (Signed)
 Husband dropped off "Notice of Denial drug Coverage Form" for Trulicity  0.75/0.5. I am placing forms in providers folder.

## 2023-11-29 MED ORDER — AMOXICILLIN 500 MG PO CAPS: 500.0000 mg | ORAL_CAPSULE | Freq: Two times a day (BID) | ORAL | 0 refills | Status: AC

## 2023-12-01 ENCOUNTER — Other Ambulatory Visit: Payer: Self-pay | Admitting: Family Medicine

## 2023-12-01 NOTE — Telephone Encounter (Signed)
Rx 11/13/23 #90 1RF-  too soon Requested Prescriptions  Pending Prescriptions Disp Refills   venlafaxine XR (EFFEXOR-XR) 150 MG 24 hr capsule [Pharmacy Med Name: VENLAFAXINE HCL ER CAPS 150MG ] 90 capsule 3    Sig: TAKE 1 CAPSULE DAILY WITH BREAKFAST     Psychiatry: Antidepressants - SNRI - desvenlafaxine & venlafaxine Failed - 12/01/2023  4:20 PM      Failed - Last BP in normal range    BP Readings from Last 1 Encounters:  11/13/23 (!) 156/90         Failed - Lipid Panel in normal range within the last 12 months    Cholesterol, Total  Date Value Ref Range Status  11/13/2023 259 (H) 100 - 199 mg/dL Final   LDL Chol Calc (NIH)  Date Value Ref Range Status  11/13/2023 154 (H) 0 - 99 mg/dL Final   HDL  Date Value Ref Range Status  11/13/2023 43 >39 mg/dL Final   Triglycerides  Date Value Ref Range Status  11/13/2023 335 (H) 0 - 149 mg/dL Final         Passed - Cr in normal range and within 360 days    Creatinine  Date Value Ref Range Status  10/24/2014 0.76 0.60 - 1.30 mg/dL Final   Creatinine, Ser  Date Value Ref Range Status  11/13/2023 0.71 0.57 - 1.00 mg/dL Final         Passed - Completed PHQ-2 or PHQ-9 in the last 360 days      Passed - Valid encounter within last 6 months    Recent Outpatient Visits           2 weeks ago Routine general medical examination at a health care facility   [facility] [place], Megan P, DO   4 weeks ago Flu-like symptoms   [place] [facility] Mecum, Oswaldo Conroy, PA-C   2 months ago Other specified hypothyroidism   [place] [facility] [place], [place], DO   4 months ago Other specified hypothyroidism   [place] [facility] [place], [place], DO   5 months ago Vertigo   [place] [facility] [place], [place], DO       Future Appointments             In 3 weeks [place], Oralia Rud, DO [place] [facility]   In 2 months  Gollan, Tollie Pizza, MD [place] [facility]

## 2023-12-25 ENCOUNTER — Encounter: Payer: Self-pay | Admitting: Family Medicine

## 2023-12-25 ENCOUNTER — Ambulatory Visit (INDEPENDENT_AMBULATORY_CARE_PROVIDER_SITE_OTHER): Payer: Medicare Other | Admitting: Family Medicine

## 2023-12-25 VITALS — BP 146/74 | HR 84 | Temp 98.8°F | Ht 63.0 in | Wt 225.6 lb

## 2023-12-25 DIAGNOSIS — E038 Other specified hypothyroidism: Secondary | ICD-10-CM

## 2023-12-25 DIAGNOSIS — I10 Essential (primary) hypertension: Secondary | ICD-10-CM | POA: Diagnosis not present

## 2023-12-25 DIAGNOSIS — R7303 Prediabetes: Secondary | ICD-10-CM | POA: Diagnosis not present

## 2023-12-25 DIAGNOSIS — R0602 Shortness of breath: Secondary | ICD-10-CM

## 2023-12-25 MED ORDER — METFORMIN HCL ER 500 MG PO TB24: ORAL_TABLET | ORAL | 3 refills | Status: DC

## 2023-12-25 MED ORDER — AMLODIPINE BESYLATE 5 MG PO TABS: 5.0000 mg | ORAL_TABLET | Freq: Every day | ORAL | 2 refills | Status: DC

## 2023-12-25 NOTE — Assessment & Plan Note (Signed)
 Insurance will not cover trulicity. Will start her on metformin and recheck in 4-6 weeks.

## 2023-12-25 NOTE — Assessment & Plan Note (Signed)
 Improved, but still running high. Will increase her amlodipine to 5mg  and recheck in 4-6 weeks. Call with any concerns.

## 2023-12-25 NOTE — Assessment & Plan Note (Signed)
 Rechecking labs today. Await results. Treat as needed.

## 2023-12-25 NOTE — Assessment & Plan Note (Signed)
 Has appointment to see cardiology in about 6 weeks. Will put in referral for her. Checking EKG today. Await results.

## 2023-12-25 NOTE — Progress Notes (Signed)
 BP (!) 146/74   Pulse 84   Temp 98.8 F (37.1 C) (Oral)   Ht 5\' 3"  (1.6 m)   Wt 225 lb 9.6 oz (102.3 kg)   SpO2 98%   BMI 39.96 kg/m    Subjective:    Patient ID: Emily [name], female    DOB: Oct 18, 1961, 63 y.o.   MRN: [id number]  HPI: Emily [name] is a 63 y.o. female  Chief Complaint  Patient presents with   Hypertension   HYPERTENSION  Hypertension status: better  Satisfied with current treatment? no Duration of hypertension: chronic BP monitoring frequency:  rarely BP medication side effects:  no Medication compliance: excellent compliance Previous BP meds:amlodipine, hydrochlorothiazide, losartan Aspirin: no Recurrent headaches: no Visual changes: no Palpitations: no Dyspnea: yes Chest pain: yes Lower extremity edema: no Dizzy/lightheaded: no  Impaired Fasting Glucose HbA1C:  Lab Results  Component Value Date   HGBA1C 5.8 (H) 11/13/2023   Duration of elevated blood sugar: chronic Polydipsia: no Polyuria: no Weight change: yes Visual disturbance: no Glucose Monitoring: no Diabetic Education: Not Completed Family history of diabetes: yes  HYPOTHYROIDISM Thyroid control status:stable Satisfied with current treatment? yes Medication side effects: no Medication compliance: excellent compliance Recent dose adjustment:yes Fatigue: yes Cold intolerance: no Heat intolerance: no Weight gain: no Weight loss: no Constipation: no Diarrhea/loose stools: no Palpitations: no Lower extremity edema: no Anxiety/depressed mood: no   Relevant past medical, surgical, family and social history reviewed and updated as indicated. Interim medical history since our last visit reviewed. Allergies and medications reviewed and updated.  Review of Systems  Constitutional: Negative.   Respiratory:  Positive for shortness of breath. Negative for apnea, cough, choking, chest tightness, wheezing and stridor.   Cardiovascular: Negative.   Gastrointestinal: Negative.    Musculoskeletal: Negative.   Neurological: Negative.   Psychiatric/Behavioral: Negative.      Per HPI unless specifically indicated above     Objective:    BP (!) 146/74   Pulse 84   Temp 98.8 F (37.1 C) (Oral)   Ht 5\' 3"  (1.6 m)   Wt 225 lb 9.6 oz (102.3 kg)   SpO2 98%   BMI 39.96 kg/m   Wt Readings from Last 3 Encounters:  12/25/23 225 lb 9.6 oz (102.3 kg)  11/13/23 222 lb 12.8 oz (101.1 kg)  09/15/23 212 lb (96.2 kg)    Physical Exam Vitals and nursing note reviewed.  Constitutional:      General: She is not in acute distress.    Appearance: Normal appearance. She is not ill-appearing, toxic-appearing or diaphoretic.  HENT:     Head: Normocephalic and atraumatic.     Right Ear: External ear normal.     Left Ear: External ear normal.     Nose: Nose normal.     Mouth/Throat:     Mouth: Mucous membranes are moist.     Pharynx: Oropharynx is clear.  Eyes:     General: No scleral icterus.       Right eye: No discharge.        Left eye: No discharge.     Extraocular Movements: Extraocular movements intact.     Conjunctiva/sclera: Conjunctivae normal.     Pupils: Pupils are equal, round, and reactive to light.  Cardiovascular:     Rate and Rhythm: Normal rate and regular rhythm.     Pulses: Normal pulses.     Heart sounds: Normal heart sounds. No murmur heard.    No friction rub. No gallop.  Pulmonary:     Effort: Pulmonary effort is normal. No respiratory distress.     Breath sounds: Normal breath sounds. No stridor. No wheezing, rhonchi or rales.  Chest:     Chest wall: No tenderness.  Musculoskeletal:        General: Normal range of motion.     Cervical back: Normal range of motion and neck supple.  Skin:    General: Skin is warm and dry.     Capillary Refill: Capillary refill takes less than 2 seconds.     Coloration: Skin is not jaundiced or pale.     Findings: No bruising, erythema, lesion or rash.  Neurological:     General: No focal deficit  present.     Mental Status: She is alert and oriented to person, place, and time. Mental status is at baseline.  Psychiatric:        Mood and Affect: Mood normal.        Behavior: Behavior normal.        Thought Content: Thought content normal.        Judgment: Judgment normal.     Results for orders placed or performed in visit on 11/13/23  Microscopic Examination   Collection Time: 11/13/23 10:36 AM   BLD  Result Value Ref Range   WBC, UA 0-5 0 - 5 /hpf   RBC, Urine None seen 0 - 2 /hpf   Epithelial Cells (non renal) 0-10 0 - 10 /hpf   Casts Present (A) None seen /lpf   Cast Type Hyaline casts N/A   Mucus, UA Present (A) Not Estab.   Bacteria, UA Few None seen/Few  Bayer DCA Hb A1c Waived   Collection Time: 11/13/23 10:36 AM  Result Value Ref Range   HB A1C (BAYER DCA - WAIVED) 5.8 (H) 4.8 - 5.6 %  Microalbumin, Urine Waived   Collection Time: 11/13/23 10:36 AM  Result Value Ref Range   Microalb, Ur Waived 80 (H) 0 - 19 mg/L   Creatinine, Urine Waived 300 10 - 300 mg/dL   Microalb/Creat Ratio 30-300 (H) <30 mg/g  Urinalysis, Routine w reflex microscopic   Collection Time: 11/13/23 10:36 AM  Result Value Ref Range   Specific Gravity, UA >1.030 (H) 1.005 - 1.030   pH, UA 5.5 5.0 - 7.5   Color, UA Yellow Yellow   Appearance Ur Hazy (A) Clear   Leukocytes,UA Trace (A) Negative   Protein,UA Negative Negative/Trace   Glucose, UA Negative Negative   Ketones, UA Negative Negative   RBC, UA Negative Negative   Bilirubin, UA Negative Negative   Urobilinogen, Ur 0.2 0.2 - 1.0 mg/dL   Nitrite, UA Negative Negative   Microscopic Examination See below:   CBC with Differential/Platelet   Collection Time: 11/13/23 10:37 AM  Result Value Ref Range   WBC 8.7 3.4 - 10.8 x10E3/uL   RBC 4.58 3.77 - 5.28 x10E6/uL   Hemoglobin 13.8 11.1 - 15.9 g/dL   Hematocrit 16.1 09.6 - 46.6 %   MCV 94 79 - 97 fL   MCH 30.1 26.6 - 33.0 pg   MCHC 32.2 31.5 - 35.7 g/dL   RDW 04.5 40.9 - 81.1 %    Platelets 317 150 - 450 x10E3/uL   Neutrophils 48 Not Estab. %   Lymphs 40 Not Estab. %   Monocytes 7 Not Estab. %   Eos 4 Not Estab. %   Basos 1 Not Estab. %   Neutrophils Absolute 4.2 1.4 - 7.0 x10E3/uL  Lymphocytes Absolute 3.5 (H) 0.7 - 3.1 x10E3/uL   Monocytes Absolute 0.6 0.1 - 0.9 x10E3/uL   EOS (ABSOLUTE) 0.3 0.0 - 0.4 x10E3/uL   Basophils Absolute 0.1 0.0 - 0.2 x10E3/uL   Immature Granulocytes 0 Not Estab. %   Immature Grans (Abs) 0.0 0.0 - 0.1 x10E3/uL  Comprehensive metabolic panel   Collection Time: 11/13/23 10:37 AM  Result Value Ref Range   Glucose 104 (H) 70 - 99 mg/dL   BUN 21 8 - 27 mg/dL   Creatinine, Ser 1.61 0.57 - 1.00 mg/dL   eGFR 95 >09 UE/AVW/0.98   BUN/Creatinine Ratio 30 (H) 12 - 28   Sodium 139 134 - 144 mmol/L   Potassium 4.1 3.5 - 5.2 mmol/L   Chloride 99 96 - 106 mmol/L   CO2 24 20 - 29 mmol/L   Calcium 9.8 8.7 - 10.3 mg/dL   Total Protein 7.6 6.0 - 8.5 g/dL   Albumin 4.5 3.9 - 4.9 g/dL   Globulin, Total 3.1 1.5 - 4.5 g/dL   Bilirubin Total 0.3 0.0 - 1.2 mg/dL   Alkaline Phosphatase 91 44 - 121 IU/L   AST 20 0 - 40 IU/L   ALT 24 0 - 32 IU/L  Lipid Panel w/o Chol/HDL Ratio   Collection Time: 11/13/23 10:37 AM  Result Value Ref Range   Cholesterol, Total 259 (H) 100 - 199 mg/dL   Triglycerides 119 (H) 0 - 149 mg/dL   HDL 43 >14 mg/dL   VLDL Cholesterol Cal 62 (H) 5 - 40 mg/dL   LDL Chol Calc (NIH) 782 (H) 0 - 99 mg/dL  TSH   Collection Time: 11/13/23 10:37 AM  Result Value Ref Range   TSH 5.550 (H) 0.450 - 4.500 uIU/mL  VITAMIN D 25 Hydroxy (Vit-D Deficiency, Fractures)   Collection Time: 11/13/23 10:37 AM  Result Value Ref Range   Vit D, 25-Hydroxy 23.3 (L) 30.0 - 100.0 ng/mL  LH   Collection Time: 11/13/23 10:37 AM  Result Value Ref Range   LH 17.5 7.7 - 58.5 mIU/mL  [facility]   Collection Time: 11/13/23 10:37 AM  Result Value Ref Range   FSH 31.4 25.8 - 134.8 mIU/mL  Estradiol   Collection Time: 11/13/23 10:37 AM  Result Value  Ref Range   Estradiol <5.0 0.0 - 54.7 pg/mL  Urine Culture   Collection Time: 11/13/23 10:57 AM   Specimen: Urine   UR  Result Value Ref Range   Urine Culture, Routine Final report (A)    Organism ID, Bacteria Comment (A)       Assessment & Plan:   Problem List Items Addressed This Visit       Cardiovascular and Mediastinum   Essential hypertension   Improved, but still running high. Will increase her amlodipine to 5mg  and recheck in 4-6 weeks. Call with any concerns.      Relevant Medications   amLODipine (NORVASC) 5 MG tablet   Other Relevant Orders   Basic metabolic panel     Endocrine   Hypothyroidism   Rechecking labs today. Await results. Treat as needed.         Other   SOB (shortness of breath) on exertion - Primary   Has appointment to see cardiology in about 6 weeks. Will put in referral for her. Checking EKG today. Await results.       Relevant Orders   Ambulatory referral to Cardiology   EKG 12-Lead   Pre-diabetes   Insurance will not cover trulicity. Will start  her on metformin and recheck in 4-6 weeks.         Follow up plan: Return in about 4 weeks (around 01/22/2024).

## 2023-12-26 LAB — BASIC METABOLIC PANEL
BUN/Creatinine Ratio: 25 (ref 12–28)
BUN: 20 mg/dL (ref 8–27)
CO2: 22 mmol/L (ref 20–29)
Calcium: 10.1 mg/dL (ref 8.7–10.3)
Chloride: 99 mmol/L (ref 96–106)
Creatinine, Ser: 0.79 mg/dL (ref 0.57–1.00)
Glucose: 113 mg/dL — ABNORMAL HIGH (ref 70–99)
Potassium: 3.9 mmol/L (ref 3.5–5.2)
Sodium: 137 mmol/L (ref 134–144)
eGFR: 83 mL/min/{1.73_m2} (ref >59)

## 2023-12-26 LAB — TSH: TSH: 6.35 u[IU]/mL — ABNORMAL HIGH (ref 0.450–4.500)

## 2023-12-30 ENCOUNTER — Other Ambulatory Visit: Payer: Self-pay | Admitting: Family Medicine

## 2023-12-30 ENCOUNTER — Encounter: Payer: Self-pay | Admitting: Family Medicine

## 2023-12-30 DIAGNOSIS — E038 Other specified hypothyroidism: Secondary | ICD-10-CM

## 2023-12-30 MED ORDER — LEVOTHYROXINE SODIUM 150 MCG PO TABS: 150.0000 ug | ORAL_TABLET | Freq: Every day | ORAL | 1 refills | Status: DC

## 2023-12-30 NOTE — Progress Notes (Signed)
 Appt moved to 02-11-24

## 2024-01-28 ENCOUNTER — Ambulatory Visit: Admitting: Family Medicine

## 2024-02-02 NOTE — Progress Notes (Signed)
 Cardiology Office Note  Date:  02/05/2024   ID:  Cherri, Yera [birth year], MRN [id number]  PCP:  Solomon Dupre, DO   Cc: Fatigue  HPI:  [name] is a 63 year old woman with past medical history of Chronic chest pain,  Fatigue, depression Morbid obesity Hypertension Shortness of breath on exertion, deconditioning Cardiac catheterization January 2017 normal coronary arteries Who presents for symptoms of fatigue, shortness of breath   Last seen in clinic October 2019 On further discussion today, she reports having no energy, chronic fatigue With exertion she develops palpitations, Also with SOB on exertion, Husband feels that for the past 6-8 months , she has been "declining" She struggles to get around Feels weak, heart pounding with any movement No regular exercise program  Feet feel numb and tingly at nighttime She is taking vitamin D  weekly  Lab work reviewed A1C 5.8 Vit D 23.3   EKG personally reviewed by myself on todays visit EKG Interpretation Date/Time:  Friday February 05 2024 08:53:45 EDT Ventricular Rate:  89 PR Interval:  178 QRS Duration:  80 QT Interval:  380 QTC Calculation: 462 R Axis:   76  Text Interpretation: Normal sinus rhythm When compared with ECG of 24-Oct-2014 06:55, No significant change was found Confirmed by Belva Boyden [phone]) on 02/05/2024 8:58:31 AM   PMH:   has a past medical history of Anxiety, Back pain, Chest pain, Constipation, Degenerative disc disease, lumbar, Depression, Essential hypertension, Fatigue, GERD (gastroesophageal reflux disease), Hypothyroidism, Joint pain in fingers of both hands, Morbid obesity (HCC), Myalgia, Neck pain, and SOB (shortness of breath).  PSH:    Past Surgical History:  Procedure Laterality Date   CARDIAC CATHETERIZATION N/A 11/14/2015   Procedure: Left Heart Cath and Coronary Angiography;  Surgeon: Wenona Hamilton, MD;  Location: [facility];  Service: Cardiovascular;   Laterality: N/A;   CARPAL TUNNEL RELEASE Left 07/03/2015   Procedure: CARPAL TUNNEL RELEASE;  Surgeon: Daris Edman, MD;  Location: [facility];  Service: Orthopedics;  Laterality: Left;   CARPAL TUNNEL RELEASE Right 09/07/2015   Procedure: CARPAL TUNNEL RELEASE;  Surgeon: Daris Edman, MD;  Location: [facility] OR[facility];  Service: Orthopedics;  Laterality: Right;   CHOLECYSTECTOMY     COLONOSCOPY WITH PROPOFOL  N/A 05/30/2021   Procedure: COLONOSCOPY WITH PROPOFOL ;  Surgeon: Irby Mannan, MD;  Location: [facility];  Service: Endoscopy;  Laterality: N/A;   KNEE SURGERY Right    TUBAL LIGATION      Current Outpatient Medications  Medication Sig Dispense Refill   amLODipine  (NORVASC ) 5 MG tablet Take 1 tablet (5 mg total) by mouth daily. 30 tablet 2   hydrochlorothiazide  (HYDRODIURIL ) 25 MG tablet Take 1 tablet (25 mg total) by mouth daily. 90 tablet 1   levothyroxine  (SYNTHROID ) 150 MCG tablet Take 1 tablet (150 mcg total) by mouth daily before breakfast. 30 tablet 1   losartan  (COZAAR ) 100 MG tablet Take 1 tablet (100 mg total) by mouth daily. 90 tablet 1   meclizine  (ANTIVERT ) 25 MG tablet Take 1 tablet (25 mg total) by mouth 3 (three) times daily as needed for dizziness. 30 tablet 0   meloxicam  (MOBIC ) 7.5 MG tablet Take 1 tablet (7.5 mg total) by mouth daily. 90 tablet 1   metFORMIN  (GLUCOPHAGE -XR) 500 MG 24 hr tablet Take 1 pill in the AM for 1 week, then 1 pill BID for 1 week, then 2 pills in the AM and 1 pill in the PM for 1 week, then 2 pills  BID 120 tablet 3   triamcinolone  ointment (KENALOG ) 0.5 % Apply 1 Application topically 2 (two) times daily. 30 g 0   venlafaxine  XR (EFFEXOR -XR) 150 MG 24 hr capsule Take 1 capsule (150 mg total) by mouth daily with breakfast. 90 capsule 1   Vitamin D , Ergocalciferol , (DRISDOL ) 1.25 MG (50000 UNIT) CAPS capsule Take 1 capsule (50,000 Units total) by mouth every 7 (seven) days. 12 capsule 1   No current facility-administered medications for  this visit.     Allergies:   Patient has no known allergies.   Social History:  The patient  reports that she quit smoking about 28 years ago. Her smoking use included cigarettes. She started smoking about 38 years ago. She has a 2.5 pack-year smoking history. She has never used smokeless tobacco. She reports that she does not drink alcohol and does not use drugs.   Family History:   family history includes Alzheimer's disease in her father; Anxiety disorder in her mother; CAD (age of onset: 70) in her cousin; Cancer in her paternal uncle; Depression in her mother; Diabetes in her maternal grandmother and paternal grandmother; Heart attack (age of onset: 38) in her father; Heart disease in her father and mother; Heart failure in her mother; Hyperlipidemia in her father; Hypertension in her father and mother; Obesity in her father and mother; Stroke in her mother; Thyroid  disease in her mother.    Review of Systems: Review of Systems  Constitutional: Negative.   HENT: Negative.    Respiratory: Negative.    Cardiovascular: Negative.   Gastrointestinal: Negative.   Musculoskeletal: Negative.   Neurological: Negative.   Psychiatric/Behavioral: Negative.    All other systems reviewed and are negative.  PHYSICAL EXAM: VS:  BP 136/74 (BP Location: Left Arm, Patient Position: Sitting, Cuff Size: Normal)   Pulse 89   Ht 5\' 3"  (1.6 m)   Wt 224 lb 3.2 oz (101.7 kg)   SpO2 95%   BMI 39.72 kg/m  , BMI Body mass index is 39.72 kg/m. Constitutional:  oriented to person, place, and time. No distress.  HENT:  Head: Grossly normal Eyes:  no discharge. No scleral icterus.  Neck: No JVD, no carotid bruits  Cardiovascular: Regular rate and rhythm, no murmurs appreciated Pulmonary/Chest: Clear to auscultation bilaterally, no wheezes or rails Abdominal: Soft.  no distension.  no tenderness.  Musculoskeletal: Normal range of motion Neurological:  normal muscle tone. Coordination normal. No  atrophy Skin: Skin warm and dry Psychiatric: normal affect, pleasant  Recent Labs: 11/13/2023: ALT 24; Hemoglobin 13.8; Platelets 317 12/25/2023: BUN 20; Creatinine, Ser 0.79; Potassium 3.9; Sodium 137; TSH 6.350    Lipid Panel Lab Results  Component Value Date   CHOL 259 (H) 11/13/2023   HDL 43 11/13/2023   LDLCALC 154 (H) 11/13/2023   TRIG 335 (H) 11/13/2023    Wt Readings from Last 3 Encounters:  02/05/24 224 lb 3.2 oz (101.7 kg)  12/25/23 225 lb 9.6 oz (102.3 kg)  11/13/23 222 lb 12.8 oz (101.1 kg)     ASSESSMENT AND PLAN:  Problem List Items Addressed This Visit       Cardiology Problems   Essential hypertension   Hyperlipidemia     Other   SOB (shortness of breath) on exertion - Primary   Relevant Orders   EKG 12-Lead (Completed)   Pre-diabetes   Other Visit Diagnoses       Morbid obesity (HCC)          Palpitations Reports having palpitations  on exertion, etiology unclear Could be secondary to deconditioning Recommend Zio monitor to rule out arrhythmia Will hold off on initiating beta-blockers until results available  Shortness of breath on exertion No prior echocardiogram available to assess cardiac function Echocardiogram ordered Calcium scoring for risk stratification Recommend weight loss, lifestyle modification, walking program Typically sedentary  Weakness Etiology unclear, Recommend regular walking program for conditioning Taking vitamin D  weekly, could consider multivitamin/B12 Denies sleep apnea   Signed, Juanda Noon, M.D., Ph.D. [facility] [place], [place] [phone]

## 2024-02-05 ENCOUNTER — Encounter: Payer: Self-pay | Admitting: Cardiovascular Disease

## 2024-02-05 ENCOUNTER — Ambulatory Visit

## 2024-02-05 ENCOUNTER — Ambulatory Visit: Payer: PRIVATE HEALTH INSURANCE | Attending: Cardiovascular Disease | Admitting: Cardiovascular Disease

## 2024-02-05 VITALS — BP 136/74 | HR 89 | Ht 63.0 in | Wt 224.2 lb

## 2024-02-05 DIAGNOSIS — R0602 Shortness of breath: Secondary | ICD-10-CM | POA: Diagnosis not present

## 2024-02-05 DIAGNOSIS — R002 Palpitations: Secondary | ICD-10-CM

## 2024-02-05 DIAGNOSIS — E782 Mixed hyperlipidemia: Secondary | ICD-10-CM | POA: Diagnosis not present

## 2024-02-05 DIAGNOSIS — R7303 Prediabetes: Secondary | ICD-10-CM | POA: Diagnosis not present

## 2024-02-05 DIAGNOSIS — I10 Essential (primary) hypertension: Secondary | ICD-10-CM | POA: Diagnosis not present

## 2024-02-05 NOTE — Patient Instructions (Addendum)
 Medication Instructions:  No changes  If you need a refill on your cardiac medications before your next appointment, please call your pharmacy.   Lab work: No new labs needed  Testing/Procedures: Your physician has requested that you have an echocardiogram. Echocardiography is a painless test that uses sound waves to create images of your heart. It provides your doctor with information about the size and shape of your heart and how well your heart's chambers and valves are working.   You may receive an ultrasound enhancing agent through an IV if needed to better visualize your heart during the echo. This procedure takes approximately one hour.  There are no restrictions for this procedure.  This will take place at [address] [facility], [place] [address]  Please note: We ask at that you not bring children with you during ultrasound (echo/ vascular) testing. Due to room size and safety concerns, children are not allowed in the ultrasound rooms during exams. Our front office staff cannot provide observation of children in our lobby area while testing is being conducted. An adult accompanying a patient to their appointment will only be allowed in the ultrasound room at the discretion of the ultrasound technician under special circumstances. We apologize for any inconvenience.   CT coronary calcium score.   - $99 out of pocket cost at the time of your test - Call [phone] to schedule at your convenience.  [facility] [address] [place], [place] [address]   Coronary CalciumScan A coronary calcium scan is an imaging test used to look for deposits of calcium and other fatty materials (plaques) in the inner lining of the blood vessels of the heart (coronary arteries). These deposits of calcium and plaques can partly clog and narrow the coronary arteries without producing any symptoms or warning signs. This puts a  person at risk for a heart attack. This test can detect these deposits before symptoms develop. Tell a health care provider about: Any allergies you have. All medicines you are taking, including vitamins, herbs, eye drops, creams, and over-the-counter medicines. Any problems you or family members have had with anesthetic medicines. Any blood disorders you have. Any surgeries you have had. Any medical conditions you have. Whether you are pregnant or may be pregnant. What are the risks? Generally, this is a safe procedure. However, problems may occur, including: Harm to a pregnant woman and her unborn baby. This test involves the use of radiation. Radiation exposure can be dangerous to a pregnant woman and her unborn baby. If you are pregnant, you generally should not have this procedure done. Slight increase in the risk of cancer. This is because of the radiation involved in the test. What happens before the procedure? No preparation is needed for this procedure. What happens during the procedure? You will undress and remove any jewelry around your neck or chest. You will put on a hospital gown. Sticky electrodes will be placed on your chest. The electrodes will be connected to an electrocardiogram (ECG) machine to record a tracing of the electrical activity of your heart. A CT scanner will take pictures of your heart. During this time, you will be asked to lie still and hold your breath for 2-3 seconds while a picture of your heart is being taken. The procedure may vary among health care providers and hospitals. What happens after the procedure? You can get dressed. You can return to your normal activities. It is up to you  to get the results of your test. Ask your health care provider, or the department that is doing the test, when your results will be ready. Summary A coronary calcium scan is an imaging test used to look for deposits of calcium and other fatty materials (plaques) in the  inner lining of the blood vessels of the heart (coronary arteries). Generally, this is a safe procedure. Tell your health care provider if you are pregnant or may be pregnant. No preparation is needed for this procedure. A CT scanner will take pictures of your heart. You can return to your normal activities after the scan is done. This information is not intended to replace advice given to you by your health care provider. Make sure you discuss any questions you have with your health care provider. Document Released: 04/03/2008 Document Revised: 08/25/2016 Document Reviewed: 08/25/2016 Elsevier Interactive Patient Education  2017 Elsevier Inc.   Emily [name]- Long Term Monitor Instructions  Your physician has requested you wear a ZIO patch monitor for 14 days.  This is a single patch monitor. [employer] supplies one patch monitor per enrollment. Additional stickers are not available. Please do not apply patch if you will be having a Nuclear Stress Test,  Echocardiogram, Cardiac CT, MRI, or Chest Xray during the period you would be wearing the  monitor. The patch cannot be worn during these tests. You cannot remove and re-apply the  ZIO XT patch monitor.  Your ZIO patch monitor will be mailed 3 day USPS to your address on file. It may take 3-5 days  to receive your monitor after you have been enrolled.  Once you have received your monitor, please review the enclosed instructions. Your monitor  has already been registered assigning a specific monitor serial # to you.  Billing and Patient Assistance Program Information  We have supplied [employer] with any of your insurance information on file for billing purposes. [employer] offers a sliding scale Patient Assistance Program for patients that do not have  insurance, or whose insurance does not completely cover the cost of the ZIO monitor.  You must apply for the Patient Assistance Program to qualify for this discounted rate.  To apply, please call [employer]  at [phone], select option 4, select option 2, ask to apply for  Patient Assistance Program. Meredeth will ask your household income, and how many people  are in your household. They will quote your out-of-pocket cost based on that information.  [employer] will also be able to set up a 58-month, interest-free payment plan if needed.  Applying the monitor   Shave hair from upper left chest.  Hold abrader disc by orange tab. Rub abrader in 40 strokes over the upper left chest as  indicated in your monitor instructions.  Clean area with 4 enclosed alcohol pads. Let dry.  Apply patch as indicated in monitor instructions. Patch will be placed under collarbone on left  side of chest with arrow pointing upward.  Rub patch adhesive wings for 2 minutes. Remove white label marked 1. Remove the white  label marked 2. Rub patch adhesive wings for 2 additional minutes.  While looking in a mirror, press and release button in center of patch. A small green light will  flash 3-4 times. This will be your only indicator that the monitor has been turned on.  Do not shower for the first 24 hours. You may shower after the first 24 hours.  Press the button if you feel a symptom. You will hear a small  click. Record Date, Time and  Symptom in the Patient Logbook.  When you are ready to remove the patch, follow instructions on the last 2 pages of Patient  Logbook. Stick patch monitor onto the last page of Patient Logbook.  Place Patient Logbook in the blue and white box. Use locking tab on box and tape box closed  securely. The blue and white box has prepaid postage on it. Please place it in the mailbox as  soon as possible. Your physician should have your test results approximately 7 days after the  monitor has been mailed back to [facility].  Call [facility] Customer Care at [phone] if you have questions regarding  your ZIO XT patch monitor. Call them immediately if you see an orange light  blinking on your  monitor.  If your monitor falls off in less than 4 days, contact our Monitor department at [phone].  If your monitor becomes loose or falls off after 4 days call [employer] at [phone] for  suggestions on securing your monitor   Follow-Up: At [facility], you and your health needs are our priority.  As part of our continuing mission to provide you with exceptional heart care, we have created designated Provider Care Teams.  These Care Teams include your primary Cardiologist (physician) and Advanced Practice Providers (APPs -  Physician Assistants and Nurse Practitioners) who all work together to provide you with the care you need, when you need it.  You will need a follow up appointment as needed  Providers on your designated Care Team:   Lonni Meager, NP Bernardino Bring, PA-C Cadence [place], [place]  COVID-19 Vaccine Information can be found at: [email or web address] For questions related to vaccine distribution or appointments, please email [email or web address] or call [email or web address].

## 2024-02-10 ENCOUNTER — Ambulatory Visit
Admission: RE | Admit: 2024-02-10 | Discharge: 2024-02-10 | Disposition: A | Discharge status: Home / Self Care | Payer: Self-pay | Source: Ambulatory Visit | Attending: Cardiovascular Disease | Admitting: Cardiovascular Disease

## 2024-02-10 DIAGNOSIS — E782 Mixed hyperlipidemia: Secondary | ICD-10-CM | POA: Insufficient documentation

## 2024-02-11 ENCOUNTER — Ambulatory Visit (INDEPENDENT_AMBULATORY_CARE_PROVIDER_SITE_OTHER): Admitting: Family Medicine

## 2024-02-11 ENCOUNTER — Encounter: Payer: Self-pay | Admitting: Family Medicine

## 2024-02-11 VITALS — BP 128/80 | HR 79 | Ht 63.0 in | Wt 224.2 lb

## 2024-02-11 DIAGNOSIS — R7303 Prediabetes: Secondary | ICD-10-CM

## 2024-02-11 DIAGNOSIS — E038 Other specified hypothyroidism: Secondary | ICD-10-CM | POA: Diagnosis not present

## 2024-02-11 DIAGNOSIS — I10 Essential (primary) hypertension: Secondary | ICD-10-CM | POA: Diagnosis not present

## 2024-02-11 MED ORDER — TRIAMCINOLONE ACETONIDE 0.5 % EX OINT: 1.0000 | TOPICAL_OINTMENT | Freq: Two times a day (BID) | CUTANEOUS | 0 refills | Status: AC

## 2024-02-11 MED ORDER — METFORMIN HCL ER 500 MG PO TB24: 500.0000 mg | ORAL_TABLET | Freq: Two times a day (BID) | ORAL | 1 refills | Status: DC

## 2024-02-11 NOTE — Progress Notes (Signed)
 BP 128/80 (BP Location: Left Arm, Patient Position: Sitting, Cuff Size: Large)   Pulse 79   Ht 5\' 3"  (1.6 m)   Wt 224 lb 3.2 oz (101.7 kg)   SpO2 97%   BMI 39.72 kg/m    Subjective:    Patient ID: Emily [name], female    DOB: July 11, 1961, 63 y.o.   MRN: [id number]  HPI: Emily [name] is a 63 y.o. female  Chief Complaint  Patient presents with   Hypertension   Obesity        HYPERTENSION  Hypertension status: better  Satisfied with current treatment? yes Duration of hypertension: chronic BP monitoring frequency:  rarely BP range:  BP medication side effects:  no Medication compliance: excellent compliance Previous BP meds:amlodipine , hydrochlorothiazide , losartan  Aspirin : no Recurrent headaches: no Visual changes: no Palpitations: no Dyspnea: yes Chest pain: no Lower extremity edema: no Dizzy/lightheaded: no  OBESITY Duration: chronic Previous attempts at weight loss: yes Complications of obesity: HTN HLD, IFG, depression Peak weight: 225lbs Weight loss goal: unsure Weight loss to date: 1lb Requesting obesity pharmacotherapy: yes Current weight loss supplements/medications: no Previous weight loss supplements/meds: yes  HYPOTHYROIDISM Thyroid  control status:stable Satisfied with current treatment? yes Medication side effects: no Medication compliance: excellent compliance Recent dose adjustment:yes Fatigue: yes Cold intolerance: no Heat intolerance: no Weight gain: no Weight loss: no Constipation: no Diarrhea/loose stools: no Palpitations: no Lower extremity edema: no Anxiety/depressed mood: yes   Relevant past medical, surgical, family and social history reviewed and updated as indicated. Interim medical history since our last visit reviewed. Allergies and medications reviewed and updated.  Review of Systems  Constitutional:  Positive for fatigue. Negative for activity change, appetite change, chills, diaphoresis, fever and unexpected weight  change.  Respiratory:  Positive for shortness of breath. Negative for apnea, cough, choking, chest tightness, wheezing and stridor.   Cardiovascular: Negative.   Musculoskeletal: Negative.   Neurological: Negative.   Psychiatric/Behavioral: Negative.      Per HPI unless specifically indicated above     Objective:    BP 128/80 (BP Location: Left Arm, Patient Position: Sitting, Cuff Size: Large)   Pulse 79   Ht 5\' 3"  (1.6 m)   Wt 224 lb 3.2 oz (101.7 kg)   SpO2 97%   BMI 39.72 kg/m   Wt Readings from Last 3 Encounters:  02/11/24 224 lb 3.2 oz (101.7 kg)  02/05/24 224 lb 3.2 oz (101.7 kg)  12/25/23 225 lb 9.6 oz (102.3 kg)    Physical Exam Vitals and nursing note reviewed.  Constitutional:      General: She is not in acute distress.    Appearance: Normal appearance. She is not ill-appearing, toxic-appearing or diaphoretic.  HENT:     Head: Normocephalic and atraumatic.     Right Ear: External ear normal.     Left Ear: External ear normal.     Nose: Nose normal.     Mouth/Throat:     Mouth: Mucous membranes are moist.     Pharynx: Oropharynx is clear.  Eyes:     General: No scleral icterus.       Right eye: No discharge.        Left eye: No discharge.     Extraocular Movements: Extraocular movements intact.     Conjunctiva/sclera: Conjunctivae normal.     Pupils: Pupils are equal, round, and reactive to light.  Cardiovascular:     Rate and Rhythm: Normal rate and regular rhythm.     Pulses: Normal  pulses.     Heart sounds: Normal heart sounds. No murmur heard.    No friction rub. No gallop.  Pulmonary:     Effort: Pulmonary effort is normal. No respiratory distress.     Breath sounds: Normal breath sounds. No stridor. No wheezing, rhonchi or rales.  Chest:     Chest wall: No tenderness.  Musculoskeletal:        General: Normal range of motion.     Cervical back: Normal range of motion and neck supple.  Skin:    General: Skin is warm and dry.     Capillary  Refill: Capillary refill takes less than 2 seconds.     Coloration: Skin is not jaundiced or pale.     Findings: No bruising, erythema, lesion or rash.  Neurological:     General: No focal deficit present.     Mental Status: She is alert and oriented to person, place, and time. Mental status is at baseline.  Psychiatric:        Mood and Affect: Mood normal.        Behavior: Behavior normal.        Thought Content: Thought content normal.        Judgment: Judgment normal.     Results for orders placed or performed in visit on 12/25/23  TSH   Collection Time: 12/25/23  3:15 PM  Result Value Ref Range   TSH 6.350 (H) 0.450 - 4.500 uIU/mL  Basic metabolic panel   Collection Time: 12/25/23  3:15 PM  Result Value Ref Range   Glucose 113 (H) 70 - 99 mg/dL   BUN 20 8 - 27 mg/dL   Creatinine, Ser 1.61 0.57 - 1.00 mg/dL   eGFR 83 >09 UE/AVW/0.98   BUN/Creatinine Ratio 25 12 - 28   Sodium 137 134 - 144 mmol/L   Potassium 3.9 3.5 - 5.2 mmol/L   Chloride 99 96 - 106 mmol/L   CO2 22 20 - 29 mmol/L   Calcium 10.1 8.7 - 10.3 mg/dL      Assessment & Plan:   Problem List Items Addressed This Visit       Cardiovascular and Mediastinum   Essential hypertension   Under good control on current regimen. Continue current regimen. Continue to monitor. Call with any concerns. Refills given. Labs drawn today.        Relevant Orders   Basic metabolic panel with GFR     Endocrine   Hypothyroidism - Primary   Rechecking labs today. Await result. Treat as needed.       Relevant Medications   levothyroxine  (SYNTHROID ) 125 MCG tablet   Other Relevant Orders   TSH     Other   Pre-diabetes   Was unable to tolerate higher dose of metformin . Did well on 1000mg  daily. Will continue current regimen and recheck in 3 months. Congratulated patient on 1lb weight loss. Continue diet and exercise with goal of losing 1-2lbs a week.         Follow up plan: Return in about 3 months (around  05/12/2024).

## 2024-02-11 NOTE — Assessment & Plan Note (Signed)
 Under good control on current regimen. Continue current regimen. Continue to monitor. Call with any concerns. Refills given. Labs drawn today.

## 2024-02-11 NOTE — Assessment & Plan Note (Signed)
 Rechecking labs today. Await result. Treat as needed.

## 2024-02-11 NOTE — Assessment & Plan Note (Signed)
 Was unable to tolerate higher dose of metformin . Did well on 1000mg  daily. Will continue current regimen and recheck in 3 months. Congratulated patient on 1lb weight loss. Continue diet and exercise with goal of losing 1-2lbs a week.

## 2024-02-12 ENCOUNTER — Encounter: Payer: Self-pay | Admitting: Cardiovascular Disease

## 2024-02-12 ENCOUNTER — Other Ambulatory Visit: Payer: Self-pay | Admitting: Family Medicine

## 2024-02-12 ENCOUNTER — Encounter: Payer: Self-pay | Admitting: Family Medicine

## 2024-02-12 DIAGNOSIS — E038 Other specified hypothyroidism: Secondary | ICD-10-CM

## 2024-02-12 LAB — BASIC METABOLIC PANEL WITH GFR
BUN/Creatinine Ratio: 25 (ref 12–28)
BUN: 15 mg/dL (ref 8–27)
CO2: 23 mmol/L (ref 20–29)
Calcium: 10 mg/dL (ref 8.7–10.3)
Chloride: 99 mmol/L (ref 96–106)
Creatinine, Ser: 0.59 mg/dL (ref 0.57–1.00)
Glucose: 80 mg/dL (ref 70–99)
Potassium: 3.8 mmol/L (ref 3.5–5.2)
Sodium: 138 mmol/L (ref 134–144)
eGFR: 101 mL/min/{1.73_m2} (ref >59)

## 2024-02-12 LAB — TSH: TSH: 0.314 u[IU]/mL — ABNORMAL LOW (ref 0.450–4.500)

## 2024-02-12 MED ORDER — LEVOTHYROXINE SODIUM 125 MCG PO TABS: 125.0000 ug | ORAL_TABLET | Freq: Every day | ORAL | 0 refills | Status: DC

## 2024-03-02 DIAGNOSIS — M5412 Radiculopathy, cervical region: Secondary | ICD-10-CM | POA: Diagnosis not present

## 2024-03-07 DIAGNOSIS — M5412 Radiculopathy, cervical region: Secondary | ICD-10-CM | POA: Diagnosis not present

## 2024-03-08 ENCOUNTER — Ambulatory Visit

## 2024-03-09 DIAGNOSIS — M5412 Radiculopathy, cervical region: Secondary | ICD-10-CM | POA: Diagnosis not present

## 2024-03-18 ENCOUNTER — Ambulatory Visit: Attending: Cardiovascular Disease

## 2024-03-18 DIAGNOSIS — R0602 Shortness of breath: Secondary | ICD-10-CM

## 2024-03-22 ENCOUNTER — Other Ambulatory Visit

## 2024-03-22 ENCOUNTER — Other Ambulatory Visit: Payer: Self-pay | Admitting: Family Medicine

## 2024-03-22 DIAGNOSIS — E038 Other specified hypothyroidism: Secondary | ICD-10-CM | POA: Diagnosis not present

## 2024-03-22 DIAGNOSIS — Z1231 Encounter for screening mammogram for malignant neoplasm of breast: Secondary | ICD-10-CM

## 2024-03-22 LAB — ECHOCARDIOGRAM COMPLETE
AR max vel: 2.88 cm2
AV Area VTI: 2.95 cm2
AV Area mean vel: 2.93 cm2
AV Mean grad: 6 mmHg
AV Peak grad: 10.2 mmHg
Ao pk vel: 1.6 m/s
Area-P 1/2: 3.85 cm2
Calc EF: 63.8 %
S' Lateral: 2.2 cm
Single Plane A2C EF: 64 %
Single Plane A4C EF: 63.1 %

## 2024-03-23 ENCOUNTER — Other Ambulatory Visit: Payer: Self-pay | Admitting: Family Medicine

## 2024-03-23 LAB — TSH: TSH: 5.67 u[IU]/mL — ABNORMAL HIGH (ref 0.450–4.500)

## 2024-03-24 NOTE — Telephone Encounter (Signed)
 Requested Prescriptions  Pending Prescriptions Disp Refills   amLODipine  (NORVASC ) 5 MG tablet [Pharmacy Med Name: AMLODIPINE  BESYLATE 5MG  TABLETS] 90 tablet 0    Sig: TAKE 1 TABLET(5 MG) BY MOUTH DAILY     Cardiovascular: Calcium Channel Blockers 2 Passed - 03/24/2024  8:21 AM      Passed - Last BP in normal range    BP Readings from Last 1 Encounters:  02/11/24 128/80         Passed - Last Heart Rate in normal range    Pulse Readings from Last 1 Encounters:  02/11/24 79         Passed - Valid encounter within last 6 months    Recent Outpatient Visits           1 month ago Other specified hypothyroidism   [place] [facility] [place], Megan P, DO   3 months ago SOB (shortness of breath) on exertion   [place] [facility] [place], Megan P, DO

## 2024-03-24 NOTE — Telephone Encounter (Signed)
 Too soon for refill.  Requested Prescriptions  Pending Prescriptions Disp Refills   metFORMIN  (GLUCOPHAGE -XR) 500 MG 24 hr tablet [Pharmacy Med Name: METFORMIN  ER 500MG  24HR TABS] 245 tablet     Sig: TAKE 1 PILL IN THE AM FOR 1 WEEK THEN 1 PILL TWICE DAILY FOR 1 WEEK THEN 2 PILLS IN THE AM AND 1 PILL IN THE PM FOR 1 WEEK THEN 2 PILLS TWICE DAILY     Endocrinology:  Diabetes - Biguanides Passed - 03/24/2024 10:42 AM      Passed - Cr in normal range and within 360 days    Creatinine  Date Value Ref Range Status  10/24/2014 0.76 0.60 - 1.30 mg/dL Final   Creatinine, Ser  Date Value Ref Range Status  02/11/2024 0.59 0.57 - 1.00 mg/dL Final         Passed - HBA1C is between 0 and 7.9 and within 180 days    HB A1C (BAYER DCA - WAIVED)  Date Value Ref Range Status  11/13/2023 5.8 (H) 4.8 - 5.6 % Final    Comment:             Prediabetes: 5.7 - 6.4          Diabetes: >6.4          Glycemic control for adults with diabetes: <7.0          Passed - eGFR in normal range and within 360 days    EGFR (African American)  Date Value Ref Range Status  10/24/2014 >60 >33mL/min Final   GFR calc Af Amer  Date Value Ref Range Status  02/28/2020 93 >59 mL/min/1.73 Final    Comment:    **[facility] currently reports eGFR in compliance with the current**   recommendations of the [employer]. [facility] will   update reporting as new guidelines are published from the NKF-ASN   Task force.    EGFR (Non-African Amer.)  Date Value Ref Range Status  10/24/2014 >60 >55mL/min Final    Comment:    eGFR values <45mL/min/1.73 m2 may be an indication of chronic kidney disease (CKD). Calculated eGFR, using the MRDR Study equation, is useful in  patients with stable renal function. The eGFR calculation will not be reliable in acutely ill patients when serum creatinine is changing rapidly. It is not useful in patients on dialysis. The eGFR calculation may not be applicable to patients at the  low and high extremes of body sizes, pregnant women, and vegetarians.    GFR calc non Af Amer  Date Value Ref Range Status  02/28/2020 80 >59 mL/min/1.73 Final   eGFR  Date Value Ref Range Status  02/11/2024 101 >59 mL/min/1.73 Final         Passed - B12 Level in normal range and within 720 days    Vitamin B-12  Date Value Ref Range Status  06/03/2022 843 232 - 1,245 pg/mL Final         Passed - Valid encounter within last 6 months    Recent Outpatient Visits           1 month ago Other specified hypothyroidism   [place] [facility] [place], Megan P, DO   3 months ago SOB (shortness of breath) on exertion   [place] [facility] [place], Megan P, DO              Passed - CBC within normal limits and completed in the last 12 months    WBC  Date Value Ref Range Status  11/13/2023 8.7 3.4 - 10.8 x10E3/uL Final  11/12/2015 7.5 3.6 - 11.0 K/uL Final   RBC  Date Value Ref Range Status  11/13/2023 4.58 3.77 - 5.28 x10E6/uL Final  11/12/2015 4.44 3.80 - 5.20 MIL/uL Final   Hemoglobin  Date Value Ref Range Status  11/13/2023 13.8 11.1 - 15.9 g/dL Final   Hematocrit  Date Value Ref Range Status  11/13/2023 42.9 34.0 - 46.6 % Final   MCHC  Date Value Ref Range Status  11/13/2023 32.2 31.5 - 35.7 g/dL Final  09/81/1914 78.2 32.0 - 36.0 g/dL Final   [facility]  Date Value Ref Range Status  11/13/2023 30.1 26.6 - 33.0 pg Final  11/12/2015 30.9 26.0 - 34.0 pg Final   MCV  Date Value Ref Range Status  11/13/2023 94 79 - 97 fL Final  10/24/2014 94 80 - 100 fL Final   No results found for: "PLTCOUNTKUC", "LABPLAT", "POCPLA" RDW  Date Value Ref Range Status  11/13/2023 12.6 11.7 - 15.4 % Final  10/24/2014 13.2 11.5 - 14.5 % Final

## 2024-03-25 DIAGNOSIS — M5412 Radiculopathy, cervical region: Secondary | ICD-10-CM | POA: Diagnosis not present

## 2024-03-28 ENCOUNTER — Ambulatory Visit: Payer: Self-pay | Admitting: Family Medicine

## 2024-03-28 DIAGNOSIS — E038 Other specified hypothyroidism: Secondary | ICD-10-CM

## 2024-03-28 MED ORDER — LEVOTHYROXINE SODIUM 137 MCG PO TABS: 137.0000 ug | ORAL_TABLET | Freq: Every day | ORAL | 0 refills | Status: DC

## 2024-04-03 ENCOUNTER — Ambulatory Visit: Payer: Self-pay | Admitting: Cardiovascular Disease

## 2024-04-03 ENCOUNTER — Other Ambulatory Visit: Payer: Self-pay | Admitting: Family Medicine

## 2024-04-04 ENCOUNTER — Encounter: Payer: Self-pay | Admitting: Emergency Medicine

## 2024-04-05 NOTE — Telephone Encounter (Signed)
 Requested medications are due for refill today.  yes  Requested medications are on the active medications list.  yes  Last refill. 02/11/2024 #180 1 rf  Future visit scheduled.   yes  Notes to clinic.  Rx needs an updated sig.    Requested Prescriptions  Pending Prescriptions Disp Refills   metFORMIN  (GLUCOPHAGE -XR) 500 MG 24 hr tablet [Pharmacy Med Name: METFORMIN  ER 500MG  24HR TABS] 245 tablet     Sig: TAKE 1 PILL IN THE AM FOR 1 WEEK THEN 1 PILL TWICE DAILY FOR 1 WEEK THEN 2 PILLS IN THE AM AND 1 PILL IN THE PM FOR 1 WEEK THEN 2 PILLS TWICE DAILY     Endocrinology:  Diabetes - Biguanides Passed - 04/05/2024  2:09 PM      Passed - Cr in normal range and within 360 days    Creatinine  Date Value Ref Range Status  10/24/2014 0.76 0.60 - 1.30 mg/dL Final   Creatinine, Ser  Date Value Ref Range Status  02/11/2024 0.59 0.57 - 1.00 mg/dL Final         Passed - HBA1C is between 0 and 7.9 and within 180 days    HB A1C (BAYER DCA - WAIVED)  Date Value Ref Range Status  11/13/2023 5.8 (H) 4.8 - 5.6 % Final    Comment:             Prediabetes: 5.7 - 6.4          Diabetes: >6.4          Glycemic control for adults with diabetes: <7.0          Passed - eGFR in normal range and within 360 days    EGFR (African American)  Date Value Ref Range Status  10/24/2014 >60 >41mL/min Final   GFR calc Af Amer  Date Value Ref Range Status  02/28/2020 93 >59 mL/min/1.73 Final    Comment:    **[facility] currently reports eGFR in compliance with the current**   recommendations of the [employer]. [facility] will   update reporting as new guidelines are published from the NKF-ASN   Task force.    EGFR (Non-African Amer.)  Date Value Ref Range Status  10/24/2014 >60 >27mL/min Final    Comment:    eGFR values <64mL/min/1.73 m2 may be an indication of chronic kidney disease (CKD). Calculated eGFR, using the MRDR Study equation, is useful in  patients with stable renal  function. The eGFR calculation will not be reliable in acutely ill patients when serum creatinine is changing rapidly. It is not useful in patients on dialysis. The eGFR calculation may not be applicable to patients at the low and high extremes of body sizes, pregnant women, and vegetarians.    GFR calc non Af Amer  Date Value Ref Range Status  02/28/2020 80 >59 mL/min/1.73 Final   eGFR  Date Value Ref Range Status  02/11/2024 101 >59 mL/min/1.73 Final         Passed - B12 Level in normal range and within 720 days    Vitamin B-12  Date Value Ref Range Status  06/03/2022 843 232 - 1,245 pg/mL Final         Passed - Valid encounter within last 6 months    Recent Outpatient Visits           1 month ago Other specified hypothyroidism   [place] [facility] [place], Megan P, DO   3 months ago SOB (shortness of breath)  on exertion   [place] [facility] [place], [place] P, DO              Passed - CBC within normal limits and completed in the last 12 months    WBC  Date Value Ref Range Status  11/13/2023 8.7 3.4 - 10.8 x10E3/uL Final  11/12/2015 7.5 3.6 - 11.0 K/uL Final   RBC  Date Value Ref Range Status  11/13/2023 4.58 3.77 - 5.28 x10E6/uL Final  11/12/2015 4.44 3.80 - 5.20 MIL/uL Final   Hemoglobin  Date Value Ref Range Status  11/13/2023 13.8 11.1 - 15.9 g/dL Final   Hematocrit  Date Value Ref Range Status  11/13/2023 42.9 34.0 - 46.6 % Final   MCHC  Date Value Ref Range Status  11/13/2023 32.2 31.5 - 35.7 g/dL Final  16/07/9603 54.0 32.0 - 36.0 g/dL Final   [facility]  Date Value Ref Range Status  11/13/2023 30.1 26.6 - 33.0 pg Final  11/12/2015 30.9 26.0 - 34.0 pg Final   MCV  Date Value Ref Range Status  11/13/2023 94 79 - 97 fL Final  10/24/2014 94 80 - 100 fL Final   No results found for: PLTCOUNTKUC, LABPLAT, POCPLA RDW  Date Value Ref Range Status  11/13/2023 12.6 11.7 - 15.4 % Final  10/24/2014 13.2 11.5 -  14.5 % Final

## 2024-04-13 DIAGNOSIS — M5412 Radiculopathy, cervical region: Secondary | ICD-10-CM | POA: Diagnosis not present

## 2024-05-08 ENCOUNTER — Other Ambulatory Visit: Payer: Self-pay | Admitting: Family Medicine

## 2024-05-10 NOTE — Telephone Encounter (Signed)
 Requested medication (s) are due for refill today: yes  Requested medication (s) are on the active medication list: yes  Last refill:  11/15/43 #12 1 RF  Future visit scheduled: yes  Notes to clinic:  med not delegated to NT to RF   Requested Prescriptions  Pending Prescriptions Disp Refills   Vitamin D , Ergocalciferol , (DRISDOL ) 1.25 MG (50000 UNIT) CAPS capsule [Pharmacy Med Name: VITAMIN D2 50,000IU (ERGO) CAP RX] 12 capsule 1    Sig: TAKE 1 CAPSULE BY MOUTH EVERY 7 DAYS     Endocrinology:  Vitamins - Vitamin D  Supplementation 2 Failed - 05/10/2024  3:48 PM      Failed - Manual Review: Route requests for 50,000 IU strength to the provider      Failed - Vitamin D  in normal range and within 360 days    Vit D, 25-Hydroxy  Date Value Ref Range Status  11/13/2023 23.3 (L) 30.0 - 100.0 ng/mL Final    Comment:    Vitamin D  deficiency has been defined by the Institute of Medicine and an Endocrine Society practice guideline as a level of serum 25-OH vitamin D  less than 20 ng/mL (1,2). The Endocrine Society went on to further define vitamin D  insufficiency as a level between 21 and 29 ng/mL (2). 1. IOM (Institute of Medicine). 2010. Dietary reference    intakes for calcium and D. [place]: The    [employer]. 2. Holick MF, Binkley [place], Bischoff-Ferrari HA, et al.    Evaluation, treatment, and prevention of vitamin D     deficiency: an Endocrine Society clinical practice    guideline. JCEM. 2011 Jul; 96(7):1911-30.          Passed - Ca in normal range and within 360 days    Calcium  Date Value Ref Range Status  02/11/2024 10.0 8.7 - 10.3 mg/dL Final   Calcium, Total  Date Value Ref Range Status  10/24/2014 8.8 8.5 - 10.1 mg/dL Final         Passed - Valid encounter within last 12 months    Recent Outpatient Visits           2 months ago Other specified hypothyroidism   [facility] [place], Megan P, DO   4 months ago SOB (shortness  of breath) on exertion   [place] [facility] [place], Megan P, DO

## 2024-05-19 ENCOUNTER — Encounter: Payer: Self-pay | Admitting: Family Medicine

## 2024-05-19 ENCOUNTER — Ambulatory Visit (INDEPENDENT_AMBULATORY_CARE_PROVIDER_SITE_OTHER): Admitting: Family Medicine

## 2024-05-19 VITALS — BP 134/83 | HR 85 | Temp 99.7°F | Ht 63.0 in | Wt 222.6 lb

## 2024-05-19 DIAGNOSIS — F4321 Adjustment disorder with depressed mood: Secondary | ICD-10-CM

## 2024-05-19 DIAGNOSIS — F339 Major depressive disorder, recurrent, unspecified: Secondary | ICD-10-CM | POA: Diagnosis not present

## 2024-05-19 DIAGNOSIS — E559 Vitamin D deficiency, unspecified: Secondary | ICD-10-CM

## 2024-05-19 DIAGNOSIS — I10 Essential (primary) hypertension: Secondary | ICD-10-CM | POA: Diagnosis not present

## 2024-05-19 DIAGNOSIS — R7303 Prediabetes: Secondary | ICD-10-CM

## 2024-05-19 DIAGNOSIS — E782 Mixed hyperlipidemia: Secondary | ICD-10-CM | POA: Diagnosis not present

## 2024-05-19 DIAGNOSIS — E038 Other specified hypothyroidism: Secondary | ICD-10-CM

## 2024-05-19 LAB — BAYER DCA HB A1C WAIVED: HB A1C (BAYER DCA - WAIVED): 5.7 % — ABNORMAL HIGH (ref 4.8–5.6)

## 2024-05-19 MED ORDER — HYDROCHLOROTHIAZIDE 25 MG PO TABS: 25.0000 mg | ORAL_TABLET | Freq: Every day | ORAL | 1 refills | Status: DC

## 2024-05-19 MED ORDER — VENLAFAXINE HCL ER 75 MG PO CP24: 75.0000 mg | ORAL_CAPSULE | Freq: Every day | ORAL | 0 refills | Status: DC

## 2024-05-19 MED ORDER — LOSARTAN POTASSIUM 100 MG PO TABS: 100.0000 mg | ORAL_TABLET | Freq: Every day | ORAL | 1 refills | Status: DC

## 2024-05-19 MED ORDER — MELOXICAM 7.5 MG PO TABS: 7.5000 mg | ORAL_TABLET | Freq: Every day | ORAL | 1 refills | Status: DC

## 2024-05-19 MED ORDER — AMLODIPINE BESYLATE 5 MG PO TABS: 5.0000 mg | ORAL_TABLET | Freq: Every day | ORAL | 1 refills | Status: AC

## 2024-05-19 MED ORDER — VENLAFAXINE HCL ER 150 MG PO CP24: 150.0000 mg | ORAL_CAPSULE | Freq: Every day | ORAL | 1 refills | Status: DC

## 2024-05-19 MED ORDER — METFORMIN HCL ER 500 MG PO TB24: 500.0000 mg | ORAL_TABLET | Freq: Two times a day (BID) | ORAL | 1 refills | Status: AC

## 2024-05-19 NOTE — Assessment & Plan Note (Signed)
 Under good control on current regimen. Continue current regimen. Continue to monitor. Call with any concerns. Refills given. Labs drawn today.

## 2024-05-19 NOTE — Assessment & Plan Note (Signed)
 Not doing well with the death of her stepmother. Will increase her effexor  to 225mg  and get her hooked in with grief counseling. Call with any concerns. Recheck 1 month.

## 2024-05-19 NOTE — Assessment & Plan Note (Signed)
 Rechecking labs today. Await results. Treat as needed.

## 2024-05-19 NOTE — Progress Notes (Signed)
 BP 134/83   Pulse 85   Temp 99.7 F (37.6 C) (Oral)   Ht 5' 3 (1.6 m)   Wt 222 lb 9.6 oz (101 kg)   SpO2 98%   BMI 39.43 kg/m    Subjective:    Patient ID: Emily [name], female    DOB: 1961/05/16, 63 y.o.   MRN: [id number]  HPI: Emily [name] is a 63 y.o. female  Chief Complaint  Patient presents with   Prediabetes   DEPRESSION-just recently lost her step mother and not doing well Mood status: exacerbated Satisfied with current treatment?: no Symptom severity: moderate  Duration of current treatment : chronic Side effects: no Medication compliance: excellent compliance Psychotherapy/counseling: no  Previous psychiatric medications: effexor  Depressed mood: yes Anxious mood: no Anhedonia: no Significant weight loss or gain: no Insomnia: no  Fatigue: yes Feelings of worthlessness or guilt: yes Impaired concentration/indecisiveness: yes Suicidal ideations: no Hopelessness: no Crying spells: yes    05/19/2024    3:17 PM 02/11/2024    2:23 PM 12/25/2023    2:33 PM 11/13/2023   10:04 AM 09/15/2023    3:27 PM  Depression screen PHQ 2/9  Decreased Interest 1 0 1 1 0  Down, Depressed, Hopeless 1 0 1 0 0  PHQ - 2 Score 2 0 2 1 0  Altered sleeping 1 2 1 1 1   Tired, decreased energy 1 3 3 1 3   Change in appetite 0 1 0 1 1  Feeling bad or failure about yourself  0 0 0 0 0  Trouble concentrating 1 0 1 1 1   Moving slowly or fidgety/restless 0 0 0 0 0  Suicidal thoughts 0 0 0 0 0  PHQ-9 Score 5 6 7 5 6   Difficult doing work/chores   Somewhat difficult  Not difficult at all   Impaired Fasting Glucose HbA1C:  Lab Results  Component Value Date   HGBA1C 5.8 (H) 11/13/2023   Duration of elevated blood sugar: chronic Polydipsia: no Polyuria: no Weight change: yes Visual disturbance: no Glucose Monitoring: no Diabetic Education: Not Completed Family history of diabetes: yes  HYPERTENSION / HYPERLIPIDEMIA Satisfied with current treatment? yes Duration of  hypertension: chronic BP monitoring frequency: not checking BP medication side effects: no Past BP meds: hydrochlorothiazide , amlodipine , losartan  Duration of hyperlipidemia: chronic Cholesterol medication side effects: N/A Cholesterol supplements: none Past cholesterol medications: none Medication compliance: excellent compliance Aspirin : no Recent stressors: yes Recurrent headaches: no Visual changes: no Palpitations: no Dyspnea: no Chest pain: no Lower extremity edema: no Dizzy/lightheaded: no  HYPOTHYROIDISM Thyroid  control status:stable Satisfied with current treatment? yes Medication side effects: no Medication compliance: excellent compliance Recent dose adjustment:no Fatigue: yes Cold intolerance: no Heat intolerance: no Weight gain: no Weight loss: no Constipation: no Diarrhea/loose stools: no Palpitations: no Lower extremity edema: no Anxiety/depressed mood: yes  Relevant past medical, surgical, family and social history reviewed and updated as indicated. Interim medical history since our last visit reviewed. Allergies and medications reviewed and updated.  Review of Systems  Constitutional: Negative.   Respiratory: Negative.    Cardiovascular: Negative.   Skin: Negative.   Psychiatric/Behavioral:  Positive for dysphoric mood. Negative for agitation, behavioral problems, confusion, decreased concentration, hallucinations, self-injury, sleep disturbance and suicidal ideas. The patient is not nervous/anxious and is not hyperactive.     Per HPI unless specifically indicated above     Objective:    BP 134/83   Pulse 85   Temp 99.7 F (37.6 C) (Oral)  Ht 5' 3 (1.6 m)   Wt 222 lb 9.6 oz (101 kg)   SpO2 98%   BMI 39.43 kg/m   Wt Readings from Last 3 Encounters:  05/19/24 222 lb 9.6 oz (101 kg)  02/11/24 224 lb 3.2 oz (101.7 kg)  02/05/24 224 lb 3.2 oz (101.7 kg)    Physical Exam Vitals and nursing note reviewed.  Constitutional:      General:  She is not in acute distress.    Appearance: Normal appearance. She is obese. She is not ill-appearing, toxic-appearing or diaphoretic.  HENT:     Head: Normocephalic and atraumatic.     Right Ear: External ear normal.     Left Ear: External ear normal.     Nose: Nose normal.     Mouth/Throat:     Mouth: Mucous membranes are moist.     Pharynx: Oropharynx is clear.  Eyes:     General: No scleral icterus.       Right eye: No discharge.        Left eye: No discharge.     Extraocular Movements: Extraocular movements intact.     Conjunctiva/sclera: Conjunctivae normal.     Pupils: Pupils are equal, round, and reactive to light.  Cardiovascular:     Rate and Rhythm: Normal rate and regular rhythm.     Pulses: Normal pulses.     Heart sounds: Normal heart sounds. No murmur heard.    No friction rub. No gallop.  Pulmonary:     Effort: Pulmonary effort is normal. No respiratory distress.     Breath sounds: Normal breath sounds. No stridor. No wheezing, rhonchi or rales.  Chest:     Chest wall: No tenderness.  Musculoskeletal:        General: Normal range of motion.     Cervical back: Normal range of motion and neck supple.  Skin:    General: Skin is warm and dry.     Capillary Refill: Capillary refill takes less than 2 seconds.     Coloration: Skin is not jaundiced or pale.     Findings: No bruising, erythema, lesion or rash.  Neurological:     General: No focal deficit present.     Mental Status: She is alert and oriented to person, place, and time. Mental status is at baseline.  Psychiatric:        Mood and Affect: Mood normal.        Behavior: Behavior normal.        Thought Content: Thought content normal.        Judgment: Judgment normal.     Results for orders placed or performed in visit on 03/22/24  TSH   Collection Time: 03/22/24 10:11 AM  Result Value Ref Range   TSH 5.670 (H) 0.450 - 4.500 uIU/mL      Assessment & Plan:   Problem List Items Addressed This  Visit       Cardiovascular and Mediastinum   Essential hypertension   Under good control on current regimen. Continue current regimen. Continue to monitor. Call with any concerns. Refills given. Labs drawn today.        Relevant Medications   amLODipine  (NORVASC ) 5 MG tablet   hydrochlorothiazide  (HYDRODIURIL ) 25 MG tablet   losartan  (COZAAR ) 100 MG tablet   Other Relevant Orders   CBC with Differential/Platelet   Comprehensive metabolic panel with GFR     Endocrine   Hypothyroidism   Rechecking labs today. Await results. Treat as needed.  Relevant Orders   CBC with Differential/Platelet   Comprehensive metabolic panel with GFR   TSH     Other   Depression, recurrent (HCC) - Primary   Not doing well with the death of her stepmother. Will increase her effexor  to 225mg  and get her hooked in with grief counseling. Call with any concerns. Recheck 1 month.      Relevant Medications   venlafaxine  XR (EFFEXOR  XR) 75 MG 24 hr capsule   venlafaxine  XR (EFFEXOR -XR) 150 MG 24 hr capsule   Other Relevant Orders   CBC with Differential/Platelet   Comprehensive metabolic panel with GFR   AMB Referral VBCI Care Management   Vitamin D  deficiency   Rechecking labs today. Await results. Treat as needed.       Relevant Orders   CBC with Differential/Platelet   Comprehensive metabolic panel with GFR   VITAMIN D  25 Hydroxy (Vit-D Deficiency, Fractures)   Pre-diabetes   Rechecking labs today. Await results. Treat as needed.       Relevant Orders   Bayer DCA Hb A1c Waived   CBC with Differential/Platelet   Comprehensive metabolic panel with GFR   Hyperlipidemia   Under good control on current regimen. Continue current regimen. Continue to monitor. Call with any concerns. Refills given. Labs drawn today.        Relevant Medications   amLODipine  (NORVASC ) 5 MG tablet   hydrochlorothiazide  (HYDRODIURIL ) 25 MG tablet   losartan  (COZAAR ) 100 MG tablet   Other Relevant Orders    CBC with Differential/Platelet   Comprehensive metabolic panel with GFR   Lipid Panel w/o Chol/HDL Ratio   Other Visit Diagnoses       Grief       Will get grief counseling involved. Call with any concerns.   Relevant Orders   AMB Referral VBCI Care Management        Follow up plan: Return 4-6 weeks.

## 2024-05-20 LAB — LIPID PANEL W/O CHOL/HDL RATIO
Cholesterol, Total: 217 mg/dL — ABNORMAL HIGH (ref 100–199)
HDL: 42 mg/dL (ref >39)
LDL Chol Calc (NIH): 130 mg/dL — ABNORMAL HIGH (ref 0–99)
Triglycerides: 253 mg/dL — ABNORMAL HIGH (ref 0–149)
VLDL Cholesterol Cal: 45 mg/dL — ABNORMAL HIGH (ref 5–40)

## 2024-05-20 LAB — CBC WITH DIFFERENTIAL/PLATELET
Basophils Absolute: 0.1 x10E3/uL (ref 0.0–0.2)
Basos: 1 %
EOS (ABSOLUTE): 0.2 x10E3/uL (ref 0.0–0.4)
Eos: 3 %
Hematocrit: 39.9 % (ref 34.0–46.6)
Hemoglobin: 13.2 g/dL (ref 11.1–15.9)
Immature Grans (Abs): 0 x10E3/uL (ref 0.0–0.1)
Immature Granulocytes: 0 %
Lymphocytes Absolute: 2.7 x10E3/uL (ref 0.7–3.1)
Lymphs: 39 %
MCH: 31.7 pg (ref 26.6–33.0)
MCHC: 33.1 g/dL (ref 31.5–35.7)
MCV: 96 fL (ref 79–97)
Monocytes Absolute: 0.5 x10E3/uL (ref 0.1–0.9)
Monocytes: 7 %
Neutrophils Absolute: 3.4 x10E3/uL (ref 1.4–7.0)
Neutrophils: 50 %
Platelets: 292 x10E3/uL (ref 150–450)
RBC: 4.16 x10E6/uL (ref 3.77–5.28)
RDW: 13 % (ref 11.7–15.4)
WBC: 7 x10E3/uL (ref 3.4–10.8)

## 2024-05-20 LAB — COMPREHENSIVE METABOLIC PANEL WITH GFR
ALT: 43 IU/L — ABNORMAL HIGH (ref 0–32)
AST: 32 IU/L (ref 0–40)
Albumin: 4.6 g/dL (ref 3.9–4.9)
Alkaline Phosphatase: 81 IU/L (ref 44–121)
BUN/Creatinine Ratio: 23 (ref 12–28)
BUN: 18 mg/dL (ref 8–27)
Bilirubin Total: 0.3 mg/dL (ref 0.0–1.2)
CO2: 20 mmol/L (ref 20–29)
Calcium: 10.2 mg/dL (ref 8.7–10.3)
Chloride: 100 mmol/L (ref 96–106)
Creatinine, Ser: 0.79 mg/dL (ref 0.57–1.00)
Globulin, Total: 2.7 g/dL (ref 1.5–4.5)
Glucose: 106 mg/dL — ABNORMAL HIGH (ref 70–99)
Potassium: 3.7 mmol/L (ref 3.5–5.2)
Sodium: 139 mmol/L (ref 134–144)
Total Protein: 7.3 g/dL (ref 6.0–8.5)
eGFR: 83 mL/min/1.73 (ref >59)

## 2024-05-20 LAB — VITAMIN D 25 HYDROXY (VIT D DEFICIENCY, FRACTURES): Vit D, 25-Hydroxy: 42.3 ng/mL (ref 30.0–100.0)

## 2024-05-20 LAB — TSH: TSH: 1.08 u[IU]/mL (ref 0.450–4.500)

## 2024-05-23 ENCOUNTER — Telehealth: Payer: Self-pay

## 2024-05-23 ENCOUNTER — Ambulatory Visit: Payer: Self-pay | Admitting: Family Medicine

## 2024-05-23 DIAGNOSIS — E038 Other specified hypothyroidism: Secondary | ICD-10-CM

## 2024-05-23 MED ORDER — LEVOTHYROXINE SODIUM 137 MCG PO TABS: 137.0000 ug | ORAL_TABLET | Freq: Every day | ORAL | 3 refills | Status: AC

## 2024-05-23 NOTE — Progress Notes (Signed)
 Complex Care Management Note  Care Guide Note 05/23/2024 Name: EULANDA DORION MRN: [id number] DOB: 1960/12/13  SARANDA LEGRANDE is a 63 y.o. year old female who sees Vicci Duwaine SQUIBB, DO for primary care. I reached out to [name] by phone today to offer complex care management services.  Ms. Sens was given information about Complex Care Management services today including:   The Complex Care Management services include support from the care team which includes your Nurse Care Manager, Clinical Social Worker, or Pharmacist.  The Complex Care Management team is here to help remove barriers to the health concerns and goals most important to you. Complex Care Management services are voluntary, and the patient may decline or stop services at any time by request to their care team member.   Complex Care Management Consent Status: Patient agreed to services and verbal consent obtained.   Follow up plan:  Telephone appointment with complex care management team member scheduled for:  05/30/2024  Encounter Outcome:  Patient Scheduled  Jeoffrey Buffalo , RMA     [place]  [facility], [facility] Guide  Direct Dial: [phone]  Website: [email or web address]

## 2024-05-30 ENCOUNTER — Telehealth: Admitting: Licensed Clinical Social Worker

## 2024-05-31 ENCOUNTER — Ambulatory Visit: Payer: Self-pay

## 2024-05-31 VITALS — Ht 63.0 in | Wt 222.0 lb

## 2024-05-31 DIAGNOSIS — Z0000 Encounter for general adult medical examination without abnormal findings: Secondary | ICD-10-CM

## 2024-05-31 DIAGNOSIS — Z1231 Encounter for screening mammogram for malignant neoplasm of breast: Secondary | ICD-10-CM

## 2024-05-31 NOTE — Progress Notes (Signed)
 Subjective:   Emily [name] is a 63 y.o. who presents for a Medicare Wellness preventive visit.  As a reminder, Annual Wellness Visits don't include a physical exam, and some assessments may be limited, especially if this visit is performed virtually. We may recommend an in-person follow-up visit with your provider if needed.  Visit Complete: Virtual I connected with  [name] on 05/31/24 by a audio enabled telemedicine application and verified that I am speaking with the correct person using two identifiers.  Patient Location: Home  Provider Location: Home Office  I discussed the limitations of evaluation and management by telemedicine. The patient expressed understanding and agreed to proceed.  Vital Signs: Because this visit was a virtual/telehealth visit, some criteria may be missing or patient reported. Any vitals not documented were not able to be obtained and vitals that have been documented are patient reported.  VideoDeclined- This patient declined Librarian, academic. Therefore the visit was completed with audio only.  Persons Participating in Visit: Patient.  AWV Questionnaire: No: Patient Medicare AWV questionnaire was not completed prior to this visit.  Cardiac Risk Factors include: dyslipidemia;hypertension;sedentary lifestyle     Objective:    Today's Vitals   05/31/24 1318  Weight: 222 lb (100.7 kg)  Height: 5' 3 (1.6 m)   Body mass index is 39.33 kg/m.     05/31/2024    1:23 PM 05/26/2023   12:57 PM 04/21/2022   11:54 AM 05/30/2021    7:02 AM 11/14/2015    8:58 AM 09/07/2015    8:35 AM 03/03/2014    8:34 AM  Advanced Directives  Does Patient Have a Medical Advance Directive? No No No No No  No  Patient would not like information   Would patient like information on creating a medical advance directive? Yes (MAU/Ambulatory/Procedural Areas - Information given) Yes (MAU/Ambulatory/Procedural Areas - Information given) No -  Patient declined  Yes - Educational materials given  No - patient declined information       Data saved with a previous flowsheet row definition    Current Medications (verified) Outpatient Encounter Medications as of 05/31/2024  Medication Sig   amLODipine  (NORVASC ) 5 MG tablet Take 1 tablet (5 mg total) by mouth daily.   hydrochlorothiazide  (HYDRODIURIL ) 25 MG tablet Take 1 tablet (25 mg total) by mouth daily.   levothyroxine  (SYNTHROID ) 137 MCG tablet Take 1 tablet (137 mcg total) by mouth daily before breakfast.   losartan  (COZAAR ) 100 MG tablet Take 1 tablet (100 mg total) by mouth daily.   meclizine  (ANTIVERT ) 25 MG tablet Take 1 tablet (25 mg total) by mouth 3 (three) times daily as needed for dizziness.   meloxicam  (MOBIC ) 7.5 MG tablet Take 1 tablet (7.5 mg total) by mouth daily.   metFORMIN  (GLUCOPHAGE -XR) 500 MG 24 hr tablet Take 1 tablet (500 mg total) by mouth 2 (two) times daily with a meal.   triamcinolone  ointment (KENALOG ) 0.5 % Apply 1 Application topically 2 (two) times daily.   venlafaxine  XR (EFFEXOR  XR) 75 MG 24 hr capsule Take 1 capsule (75 mg total) by mouth daily with breakfast. Take with the 150mg  pill for 225mg  daily   venlafaxine  XR (EFFEXOR -XR) 150 MG 24 hr capsule Take 1 capsule (150 mg total) by mouth daily with breakfast. Take with the 75mg  pill for 225mg  daily   Vitamin D , Ergocalciferol , (DRISDOL ) 1.25 MG (50000 UNIT) CAPS capsule Take 1 capsule (50,000 Units total) by mouth every 7 (seven) days.  No facility-administered encounter medications on file as of 05/31/2024.    Allergies (verified) Patient has no known allergies.   History: Past Medical History:  Diagnosis Date   Anxiety    Back pain    Chest pain    a. 10/2013 Neg ETT; b. 10/2015 Refused MV->Cath: nl cors, nl EF.   Constipation    Degenerative disc disease, lumbar    Depression    Essential hypertension    Fatigue    GERD (gastroesophageal reflux disease)    Hypothyroidism    Joint  pain in fingers of both hands    Morbid obesity (HCC)    Myalgia    Neck pain    SOB (shortness of breath)    Past Surgical History:  Procedure Laterality Date   CARDIAC CATHETERIZATION N/A 11/14/2015   Procedure: Left Heart Cath and Coronary Angiography;  Surgeon: Deatrice DELENA Cage, MD;  [facility]: [facility];  Service: Cardiovascular;  Laterality: N/A;   CARPAL TUNNEL RELEASE Left 07/03/2015   Procedure: CARPAL TUNNEL RELEASE;  Surgeon: Lonni Sharps, MD;  Location: [facility];  Service: Orthopedics;  Laterality: Left;   CARPAL TUNNEL RELEASE Right 09/07/2015   Procedure: CARPAL TUNNEL RELEASE;  Surgeon: Lonni Sharps, MD;  Location: [facility] OR[facility];  Service: Orthopedics;  Laterality: Right;   CHOLECYSTECTOMY     COLONOSCOPY WITH PROPOFOL  N/A 05/30/2021   Procedure: COLONOSCOPY WITH PROPOFOL ;  Surgeon: Janalyn Keene NOVAK, MD;  Location: [facility] ENDOSCOPY;  Service: Endoscopy;  Laterality: N/A;   KNEE SURGERY Right    TUBAL LIGATION     Family History  Problem Relation Age of Onset   Stroke Mother    Heart failure Mother    Heart disease Mother    Hypertension Mother    Thyroid  disease Mother    Depression Mother    Anxiety disorder Mother    Obesity Mother    Heart attack Father 52   Hypertension Father    Hyperlipidemia Father    Alzheimer's disease Father    Heart disease Father    Obesity Father    Cancer Paternal Uncle        prostate   Diabetes Maternal Grandmother    Diabetes Paternal Grandmother    CAD Cousin 25       passed   Breast cancer Neg Hx    Social History   Socioeconomic History   Marital status: Married    Spouse name: Reyes   Number of children: Not on file   Years of education: Not on file   Highest education level: Not on file  Occupational History   Occupation: retired from [employer] 30 yrs of service  Tobacco Use   Smoking status: Former    Current packs/day: 0.00    Average packs/day: 0.3 packs/day for 10.0 years (2.5 ttl  pk-yrs)    Types: Cigarettes    Start date: 07/01/1985    Quit date: 07/02/1995    Years since quitting: 28.9   Smokeless tobacco: Never  Vaping Use   Vaping status: Never Used  Substance and Sexual Activity   Alcohol use: No    Alcohol/week: 0.0 standard drinks of alcohol   Drug use: No   Sexual activity: Yes    Partners: Male  Other Topics Concern   Not on file  Social History Narrative   Married to [place], has 1 daughter and 1 son and 1 step-daughter and 1 step-son   Social Drivers of Corporate investment banker Strain: Low Risk  (  05/31/2024)   Overall Financial Resource Strain (CARDIA)    Difficulty of Paying Living Expenses: Not hard at all  Food Insecurity: No Food Insecurity (05/31/2024)   Hunger Vital Sign    Worried About Running Out of Food in the Last Year: Never true    Ran Out of Food in the Last Year: Never true  Transportation Needs: No Transportation Needs (05/31/2024)   PRAPARE - Administrator, Civil Service (Medical): No    Lack of Transportation (Non-Medical): No  Physical Activity: Inactive (05/31/2024)   Exercise Vital Sign    Days of Exercise per Week: 0 days    Minutes of Exercise per Session: 0 min  Stress: No Stress Concern Present (05/31/2024)   [employer] of Occupational Health - Occupational Stress Questionnaire    Feeling of Stress: Only a little  Social Connections: Socially Integrated (05/31/2024)   Social Connection and Isolation Panel    Frequency of Communication with Friends and Family: Three times a week    Frequency of Social Gatherings with Friends and Family: Patient declined    Attends Religious Services: More than 4 times per year    Active Member of [employer] or Organizations: Yes    Attends Engineer, structural: More than 4 times per year    Marital Status: Married    Tobacco Counseling Counseling given: Not Answered    Clinical Intake:  Pre-visit preparation completed: Yes  Pain : No/denies  pain  Diabetes: No  Lab Results  Component Value Date   HGBA1C 5.7 (H) 05/19/2024   HGBA1C 5.8 (H) 11/13/2023   HGBA1C 5.7 (H) 06/11/2023     How often do you need to have someone help you when you read instructions, pamphlets, or other written materials from your doctor or pharmacy?: 1 - Never  Interpreter Needed?: No  Information entered by :: Charmaine Bloodgood LPN   Activities of Daily Living     05/31/2024    1:23 PM  In your present state of health, do you have any difficulty performing the following activities:  Hearing? 0  Vision? 0  Difficulty concentrating or making decisions? 0  Walking or climbing stairs? 0  Dressing or bathing? 0  Doing errands, shopping? 0  Preparing Food and eating ? N  Using the Toilet? N  In the past six months, have you accidently leaked urine? N  Do you have problems with loss of bowel control? N  Managing your Medications? N  Managing your Finances? N  Housekeeping or managing your Housekeeping? N    Patient Care Team: Vicci Duwaine SQUIBB, DO as PCP - General (Family Medicine) Ortho, [facility] (Orthopedic Surgery) Gollan, Timothy J, MD as Consulting Physician (Cardiology) Pa, [facility]  I have updated your Care Teams any recent Medical Services you may have received from other providers in the past year.     Assessment:   This is a routine wellness examination for Aunesty.  Hearing/Vision screen Hearing Screening - Comments:: Denies hearing difficulties   Vision Screening - Comments:: Wears rx glasses - up to date with routine eye exams with [facility]    Goals Addressed             This Visit's Progress    Maintain health and independence   On track      Depression Screen     05/31/2024    1:22 PM 05/19/2024    3:17 PM 02/11/2024    2:23 PM 12/25/2023  2:33 PM 11/13/2023   10:04 AM 09/15/2023    3:27 PM 07/10/2023    2:26 PM  PHQ 2/9 Scores  PHQ - 2 Score 2 2 0 2 1 0 1  PHQ- 9 Score 5 5 6 7 5 6 5     Fall  Risk     05/31/2024    1:23 PM 12/25/2023    2:33 PM 11/13/2023   10:02 AM 09/15/2023    3:28 PM 06/11/2023    2:23 PM  Fall Risk   Falls in the past year? 0 0 0 0 0  Number falls in past yr: 0 0 0 0 0  Injury with Fall? 0 0 0 0 0  Risk for fall due to : No Fall Risks No Fall Risks No Fall Risks  No Fall Risks  Follow up Falls prevention discussed;Education provided;Falls evaluation completed Falls evaluation completed Falls evaluation completed  Falls evaluation completed    MEDICARE RISK AT HOME:  Medicare Risk at Home Any stairs in or around the home?: No If so, are there any without handrails?: No Home free of loose throw rugs in walkways, pet beds, electrical cords, etc?: Yes Adequate lighting in your home to reduce risk of falls?: Yes Life alert?: No Use of a cane, walker or w/c?: No Grab bars in the bathroom?: Yes Shower chair or bench in shower?: No Elevated toilet seat or a handicapped toilet?: Yes  TIMED UP AND GO:  Was the test performed?  No  Cognitive Function: Declined/Normal: No cognitive concerns noted by patient or family. Patient alert, oriented, able to answer questions appropriately and recall recent events. No signs of memory loss or confusion.        05/26/2023   12:58 PM 04/21/2022   11:52 AM  6CIT Screen  What Year? 0 points 0 points  What month? 0 points 0 points  What time? 0 points 0 points  Count back from 20 0 points 0 points  Months in reverse 0 points 0 points  Repeat phrase 0 points 0 points  Total Score 0 points 0 points    Immunizations Immunization History  Administered Date(s) Administered   Influenza,inj,Quad PF,6+ Mos 08/27/2015, 07/21/2017, 08/26/2018, 10/15/2021   Pneumococcal Polysaccharide-23 04/07/2016   Tdap 07/25/2009, 02/28/2020   Zoster Recombinant(Shingrix ) 03/13/2022, 06/09/2022    Screening Tests Health Maintenance  Topic Date Due   Pneumococcal Vaccine: 50+ Years (2 of 2 - PCV) 04/07/2017   MAMMOGRAM  12/07/2023    INFLUENZA VACCINE  05/20/2024   DEXA SCAN  07/09/2024   Medicare Annual Wellness (AWV)  05/31/2025   Cervical Cancer Screening (HPV/Pap Cotest)  03/04/2026   DTaP/Tdap/Td (3 - Td or Tdap) 02/27/2030   Colonoscopy  05/31/2031   Hepatitis C Screening  Completed   HIV Screening  Completed   Zoster Vaccines- Shingrix   Completed   Pneumococcal Vaccine: 19-49 Years  Aged Out   Hepatitis B Vaccines  Aged Out   HPV VACCINES  Aged Out   Meningococcal B Vaccine  Aged Out   COVID-19 Vaccine  Discontinued    Health Maintenance  Health Maintenance Due  Topic Date Due   Pneumococcal Vaccine: 50+ Years (2 of 2 - PCV) 04/07/2017   MAMMOGRAM  12/07/2023   INFLUENZA VACCINE  05/20/2024   Health Maintenance Items Addressed: Mammogram ordered  Additional Screening:  Vision Screening: Recommended annual ophthalmology exams for early detection of glaucoma and other disorders of the eye. Would you like a referral to an eye doctor? No  Dental Screening: Recommended annual dental exams for proper oral hygiene  Community Resource Referral / Chronic Care Management: CRR required this visit?  No   CCM required this visit?  No   Plan:    I have personally reviewed and noted the following in the patient's chart:   Medical and social history Use of alcohol, tobacco or illicit drugs  Current medications and supplements including opioid prescriptions. Patient is not currently taking opioid prescriptions. Functional ability and status Nutritional status Physical activity Advanced directives List of other physicians Hospitalizations, surgeries, and ER visits in previous 12 months Vitals Screenings to include cognitive, depression, and falls Referrals and appointments  In addition, I have reviewed and discussed with patient certain preventive protocols, quality metrics, and best practice recommendations. A written personalized care plan for preventive services as well as general preventive  health recommendations were provided to patient.   Lavelle Pfeiffer [place], [place]   1/87/7974   After Visit Summary: (MyChart) Due to this being a telephonic visit, the after visit summary with patients personalized plan was offered to patient via MyChart   Notes: Nothing significant to report at this time.

## 2024-05-31 NOTE — Patient Instructions (Signed)
 Emily [name] , Thank you for taking time out of your busy schedule to complete your Annual Wellness Visit with me. I enjoyed our conversation and look forward to speaking with you again next year. I, as well as your care team,  appreciate your ongoing commitment to your health goals. Please review the following plan we discussed and let me know if I can assist you in the future. Your Game plan/ To Do List    Referrals: You have an order for:  []   2D Mammogram  [x]   3D Mammogram  []   Bone Density     Please call for appointment:   [facility]  [address] [place] [address] [phone]  Make sure to wear two-piece clothing.  No lotions, powders, or deodorants the day of the appointment. Make sure to bring picture ID and insurance card.  Bring list of medications you are currently taking including any supplements.   Schedule your  screening mammogram through MyChart!   Log into your MyChart account.  Go to 'Visit' (or 'Appointments' if on mobile App) --> Schedule an Appointment  Under 'Select a Reason for Visit' choose the Mammogram Screening option.  Complete the pre-visit questions and select the time and place that best fits your schedule.     Follow up Visits: We will see or speak with you next year for your Next Medicare AWV with our clinical staff Have you seen your provider in the last 6 months (3 months if uncontrolled diabetes)? Yes  Clinician Recommendations:  Aim for 30 minutes of exercise or brisk walking, 6-8 glasses of water, and 5 servings of fruits and vegetables each day.       This is a list of the screenings recommended for you:  Health Maintenance  Topic Date Due   Pneumococcal Vaccine for age over 15 (2 of 2 - PCV) 04/07/2017   Mammogram  12/07/2023   Flu Shot  05/20/2024   DEXA scan (bone density measurement)  07/09/2024   Medicare Annual Wellness Visit  05/31/2025   Pap with HPV  screening  03/04/2026   DTaP/Tdap/Td vaccine (3 - Td or Tdap) 02/27/2030   Colon Cancer Screening  05/31/2031   Hepatitis C Screening  Completed   HIV Screening  Completed   Zoster (Shingles) Vaccine  Completed   Pneumococcal Vaccine for high risk medical condition  Aged Out   Hepatitis B Vaccine  Aged Out   HPV Vaccine  Aged Out   Meningitis B Vaccine  Aged Out   COVID-19 Vaccine  Discontinued    Advanced directives: (ACP Link)Information on Advanced Care Planning can be found at [employer] [facility] Advance Health Care Directives Advance Health Care Directives. [email or web address]   Advance Care Planning is important because it:  [x]  Makes sure you receive the medical care that is consistent with your values, goals, and preferences  [x]  It provides guidance to your family and loved ones and reduces their decisional burden about whether or not they are making the right decisions based on your wishes.  Follow the link provided in your after visit summary or read over the paperwork we have mailed to you to help you started getting your Advance Directives in place. If you need assistance in completing these, please reach out to us  so that we can help you!  See attachments for Preventive Care and Fall Prevention Tips.

## 2024-06-08 ENCOUNTER — Encounter: Payer: Self-pay | Admitting: Licensed Clinical Social Worker

## 2024-06-08 ENCOUNTER — Telehealth: Payer: Self-pay | Admitting: Licensed Clinical Social Worker

## 2024-06-13 ENCOUNTER — Telehealth: Payer: Self-pay

## 2024-06-13 NOTE — Progress Notes (Signed)
 Complex Care Management Care Guide Note  06/13/2024 Name: Emily [name] MRN: [id number] DOB: Nov 05, 1960  Emily [name] is a 63 y.o. year old female who is a primary care patient of Vicci Duwaine SQUIBB, DO and is actively engaged with the care management team. I reached out to [name] by phone today to assist with re-scheduling  with the Licensed Clinical Child psychotherapist.  Follow up plan: Unsuccessful telephone outreach attempt made. A HIPAA compliant phone message was left for the patient providing contact information and requesting a return call.  Jeoffrey Buffalo , RMA     [facility]  [facility], [facility] Guide  Direct Dial: [phone]  Website: [email or web address]

## 2024-06-22 NOTE — Progress Notes (Signed)
 Complex Care Management Care Guide Note  06/22/2024 Name: Emily [name] MRN: [id number] DOB: 11/04/1960  Emily [name] is a 63 y.o. year old female who is a primary care patient of Vicci Duwaine SQUIBB, DO and is actively engaged with the care management team. I reached out to [name] by phone today to assist with re-scheduling  with the Licensed Clinical Child psychotherapist.  Follow up plan: Unsuccessful telephone outreach attempt made.   Jeoffrey Buffalo , RMA     [facility]  [facility], [facility] Guide  Direct Dial: [phone]  Website: [email or web address]

## 2024-06-29 ENCOUNTER — Other Ambulatory Visit: Payer: Self-pay | Admitting: Family Medicine

## 2024-06-29 DIAGNOSIS — E038 Other specified hypothyroidism: Secondary | ICD-10-CM

## 2024-06-30 NOTE — Telephone Encounter (Signed)
 Requested Prescriptions  Refused Prescriptions Disp Refills   levothyroxine  (SYNTHROID ) 137 MCG tablet [Pharmacy Med Name: LEVOTHYROXINE  0.137MG  ( ) TAB] 90 tablet 3    Sig: TAKE 1 TABLET(137 MCG) BY MOUTH DAILY BEFORE BREAKFAST     Endocrinology:  Hypothyroid Agents Passed - 06/30/2024  1:53 PM      Passed - TSH in normal range and within 360 days    TSH  Date Value Ref Range Status  05/19/2024 1.080 0.450 - 4.500 uIU/mL Final         Passed - Valid encounter within last 12 months    Recent Outpatient Visits           1 month ago Depression, recurrent (HCC)   [place] [facility] [place], Megan P, DO   4 months ago Other specified hypothyroidism   [place] [facility] [place], Megan P, DO   6 months ago SOB (shortness of breath) on exertion   [place] [facility] [place], Megan P, DO

## 2024-07-12 ENCOUNTER — Encounter: Payer: Self-pay | Admitting: Family Medicine

## 2024-07-12 ENCOUNTER — Ambulatory Visit (INDEPENDENT_AMBULATORY_CARE_PROVIDER_SITE_OTHER): Admitting: Family Medicine

## 2024-07-12 VITALS — BP 136/83 | HR 80 | Ht 64.0 in | Wt 215.4 lb

## 2024-07-12 DIAGNOSIS — F339 Major depressive disorder, recurrent, unspecified: Secondary | ICD-10-CM

## 2024-07-12 DIAGNOSIS — Z23 Encounter for immunization: Secondary | ICD-10-CM | POA: Diagnosis not present

## 2024-07-12 DIAGNOSIS — Z780 Asymptomatic menopausal state: Secondary | ICD-10-CM

## 2024-07-12 DIAGNOSIS — Z1231 Encounter for screening mammogram for malignant neoplasm of breast: Secondary | ICD-10-CM | POA: Diagnosis not present

## 2024-07-12 MED ORDER — VENLAFAXINE HCL ER 75 MG PO CP24: 75.0000 mg | ORAL_CAPSULE | Freq: Every day | ORAL | 0 refills | Status: AC

## 2024-07-12 NOTE — Assessment & Plan Note (Signed)
 Under good control on current regimen. Continue current regimen. Continue to monitor. Call with any concerns. Refills given.

## 2024-07-12 NOTE — Patient Instructions (Signed)
 Please call to schedule your mammogram and/or bone density: [facility] at [facility]  Address: [address], [place], [place] [address] Phone: [phone]  [facility] [address] [place],  [place]  [address] Phone: [phone]

## 2024-07-12 NOTE — Assessment & Plan Note (Signed)
 Down 7lbs with metformin ! Continue diet and exercise. Call with any concerns.

## 2024-07-12 NOTE — Progress Notes (Signed)
 BP 136/83 (BP Location: Left Arm, Patient Position: Sitting, Cuff Size: Large)   Pulse 80   Ht 5' 4 (1.626 m)   Wt 215 lb 6.4 oz (97.7 kg)   SpO2 95%   BMI 36.97 kg/m    Subjective:    Patient ID: Emily [name], female    DOB: 15-Oct-1961, 63 y.o.   MRN: [id number]  HPI: Emily [name] is a 63 y.o. female  Chief Complaint  Patient presents with   Depression   DEPRESSION Mood status: better Satisfied with current treatment?: yes Symptom severity: mild  Duration of current treatment : chronic Side effects: no Medication compliance: excellent compliance Psychotherapy/counseling: no  Previous psychiatric medications: effexor  Depressed mood: no Anxious mood: no Anhedonia: no Significant weight loss or gain: no Insomnia: no  Fatigue: no Feelings of worthlessness or guilt: no Impaired concentration/indecisiveness: no Suicidal ideations: no Hopelessness: no Crying spells: no    05/31/2024    1:22 PM 05/19/2024    3:17 PM 02/11/2024    2:23 PM 12/25/2023    2:33 PM 11/13/2023   10:04 AM  Depression screen PHQ 2/9  Decreased Interest 1 1 0 1 1  Down, Depressed, Hopeless 1 1 0 1 0  PHQ - 2 Score 2 2 0 2 1  Altered sleeping 1 1 2 1 1   Tired, decreased energy 1 1 3 3 1   Change in appetite 0 0 1 0 1  Feeling bad or failure about yourself  0 0 0 0 0  Trouble concentrating 1 1 0 1 1  Moving slowly or fidgety/restless 0 0 0 0 0  Suicidal thoughts  0 0 0 0  PHQ-9 Score 5 5 6 7 5   Difficult doing work/chores    Somewhat difficult    OBESITY Duration: chronic Previous attempts at weight loss: yes Complications of obesity: depression, HTN, IFG, HLD Peak weight: 222lbs Weight loss goal: to be healthy Weight loss to date: 7lbs Requesting obesity pharmacotherapy: yes Current weight loss supplements/medications: yes Previous weight loss supplements/meds: no   Relevant past medical, surgical, family and social history reviewed and updated as indicated. Interim medical history  since our last visit reviewed. Allergies and medications reviewed and updated.  Review of Systems  Constitutional: Negative.   Respiratory: Negative.    Cardiovascular: Negative.   Musculoskeletal: Negative.   Psychiatric/Behavioral: Negative.      Per HPI unless specifically indicated above     Objective:    BP 136/83 (BP Location: Left Arm, Patient Position: Sitting, Cuff Size: Large)   Pulse 80   Ht 5' 4 (1.626 m)   Wt 215 lb 6.4 oz (97.7 kg)   SpO2 95%   BMI 36.97 kg/m   Wt Readings from Last 3 Encounters:  07/12/24 215 lb 6.4 oz (97.7 kg)  05/31/24 222 lb (100.7 kg)  05/19/24 222 lb 9.6 oz (101 kg)    Physical Exam Vitals and nursing note reviewed.  Constitutional:      General: She is not in acute distress.    Appearance: Normal appearance. She is obese. She is not ill-appearing, toxic-appearing or diaphoretic.  HENT:     Head: Normocephalic and atraumatic.     Right Ear: External ear normal.     Left Ear: External ear normal.     Nose: Nose normal.     Mouth/Throat:     Mouth: Mucous membranes are moist.     Pharynx: Oropharynx is clear.  Eyes:     General: No scleral  icterus.       Right eye: No discharge.        Left eye: No discharge.     Extraocular Movements: Extraocular movements intact.     Conjunctiva/sclera: Conjunctivae normal.     Pupils: Pupils are equal, round, and reactive to light.  Cardiovascular:     Rate and Rhythm: Normal rate and regular rhythm.     Pulses: Normal pulses.     Heart sounds: Normal heart sounds. No murmur heard.    No friction rub. No gallop.  Pulmonary:     Effort: Pulmonary effort is normal. No respiratory distress.     Breath sounds: Normal breath sounds. No stridor. No wheezing, rhonchi or rales.  Chest:     Chest wall: No tenderness.  Musculoskeletal:        General: Normal range of motion.     Cervical back: Normal range of motion and neck supple.  Skin:    General: Skin is warm and dry.     Capillary  Refill: Capillary refill takes less than 2 seconds.     Coloration: Skin is not jaundiced or pale.     Findings: No bruising, erythema, lesion or rash.  Neurological:     General: No focal deficit present.     Mental Status: She is alert and oriented to person, place, and time. Mental status is at baseline.  Psychiatric:        Mood and Affect: Mood normal.        Behavior: Behavior normal.        Thought Content: Thought content normal.        Judgment: Judgment normal.     Results for orders placed or performed in visit on 05/19/24  Bayer DCA Hb A1c Waived   Collection Time: 05/19/24  3:17 PM  Result Value Ref Range   HB A1C (BAYER DCA - WAIVED) 5.7 (H) 4.8 - 5.6 %  CBC with Differential/Platelet   Collection Time: 05/19/24  3:18 PM  Result Value Ref Range   WBC 7.0 3.4 - 10.8 x10E3/uL   RBC 4.16 3.77 - 5.28 x10E6/uL   Hemoglobin 13.2 11.1 - 15.9 g/dL   Hematocrit 60.0 65.9 - 46.6 %   MCV 96 79 - 97 fL   MCH 31.7 26.6 - 33.0 pg   MCHC 33.1 31.5 - 35.7 g/dL   RDW 86.9 88.2 - 84.5 %   Platelets 292 150 - 450 x10E3/uL   Neutrophils 50 Not Estab. %   Lymphs 39 Not Estab. %   Monocytes 7 Not Estab. %   Eos 3 Not Estab. %   Basos 1 Not Estab. %   Neutrophils Absolute 3.4 1.4 - 7.0 x10E3/uL   Lymphocytes Absolute 2.7 0.7 - 3.1 x10E3/uL   Monocytes Absolute 0.5 0.1 - 0.9 x10E3/uL   EOS (ABSOLUTE) 0.2 0.0 - 0.4 x10E3/uL   Basophils Absolute 0.1 0.0 - 0.2 x10E3/uL   Immature Granulocytes 0 Not Estab. %   Immature Grans (Abs) 0.0 0.0 - 0.1 x10E3/uL  Comprehensive metabolic panel with GFR   Collection Time: 05/19/24  3:18 PM  Result Value Ref Range   Glucose 106 (H) 70 - 99 mg/dL   BUN 18 8 - 27 mg/dL   Creatinine, Ser 9.20 0.57 - 1.00 mg/dL   eGFR 83 >40 fO/fpw/8.26   BUN/Creatinine Ratio 23 12 - 28   Sodium 139 134 - 144 mmol/L   Potassium 3.7 3.5 - 5.2 mmol/L   Chloride 100 96 - 106  mmol/L   CO2 20 20 - 29 mmol/L   Calcium 10.2 8.7 - 10.3 mg/dL   Total Protein 7.3 6.0  - 8.5 g/dL   Albumin 4.6 3.9 - 4.9 g/dL   Globulin, Total 2.7 1.5 - 4.5 g/dL   Bilirubin Total 0.3 0.0 - 1.2 mg/dL   Alkaline Phosphatase 81 44 - 121 IU/L   AST 32 0 - 40 IU/L   ALT 43 (H) 0 - 32 IU/L  Lipid Panel w/o Chol/HDL Ratio   Collection Time: 05/19/24  3:18 PM  Result Value Ref Range   Cholesterol, Total 217 (H) 100 - 199 mg/dL   Triglycerides 746 (H) 0 - 149 mg/dL   HDL 42 >60 mg/dL   VLDL Cholesterol Cal 45 (H) 5 - 40 mg/dL   LDL Chol Calc (NIH) 869 (H) 0 - 99 mg/dL  TSH   Collection Time: 05/19/24  3:18 PM  Result Value Ref Range   TSH 1.080 0.450 - 4.500 uIU/mL  VITAMIN D  25 Hydroxy (Vit-D Deficiency, Fractures)   Collection Time: 05/19/24  3:18 PM  Result Value Ref Range   Vit D, 25-Hydroxy 42.3 30.0 - 100.0 ng/mL      Assessment & Plan:   Problem List Items Addressed This Visit       Other   Depression, recurrent - Primary   Under good control on current regimen. Continue current regimen. Continue to monitor. Call with any concerns. Refills given.        Relevant Medications   venlafaxine  XR (EFFEXOR  XR) 75 MG 24 hr capsule   Morbid obesity (HCC)   Down 7lbs with metformin ! Continue diet and exercise. Call with any concerns.      Other Visit Diagnoses       Needs flu shot       Flu shot given today.   Relevant Orders   Flu vaccine HIGH DOSE PF(Fluzone Trivalent) (Completed)     Need for pneumococcal vaccination       Prevnar given today.   Relevant Orders   Pneumococcal conjugate vaccine 20-valent (Prevnar 20) (Completed)     Encounter for screening mammogram for malignant neoplasm of breast       Mammo ordered today.   Relevant Orders   MM 3D SCREENING MAMMOGRAM BILATERAL BREAST     Postmenopausal estrogen deficiency       DEXA ordered today.   Relevant Orders   DG Bone Density        Follow up plan: Return in about 4 months (around 11/14/2024) for physical.

## 2024-08-21 ENCOUNTER — Other Ambulatory Visit: Payer: Self-pay | Admitting: Family Medicine

## 2024-08-23 NOTE — Telephone Encounter (Signed)
 Requested Prescriptions  Refused Prescriptions Disp Refills   venlafaxine  XR (EFFEXOR -XR) 75 MG 24 hr capsule [Pharmacy Med Name: VENLAFAXINE  ER 75MG  CAPSULES] 90 capsule 0    Sig: TAKE 1 CAPSULE BY MOUTH DAILY WITH BREAKFAST WITH 150 MG DOSE TO TOTAL 225 MG     Psychiatry: Antidepressants - SNRI - desvenlafaxine & venlafaxine  Failed - 08/23/2024 12:18 PM      Failed - Lipid Panel in normal range within the last 12 months    Cholesterol, Total  Date Value Ref Range Status  05/19/2024 217 (H) 100 - 199 mg/dL Final   LDL Chol Calc (NIH)  Date Value Ref Range Status  05/19/2024 130 (H) 0 - 99 mg/dL Final   HDL  Date Value Ref Range Status  05/19/2024 42 >39 mg/dL Final   Triglycerides  Date Value Ref Range Status  05/19/2024 253 (H) 0 - 149 mg/dL Final         Passed - Cr in normal range and within 360 days    Creatinine  Date Value Ref Range Status  10/24/2014 0.76 0.60 - 1.30 mg/dL Final   Creatinine, Ser  Date Value Ref Range Status  05/19/2024 0.79 0.57 - 1.00 mg/dL Final         Passed - Completed PHQ-2 or PHQ-9 in the last 360 days      Passed - Last BP in normal range    BP Readings from Last 1 Encounters:  07/12/24 136/83         Passed - Valid encounter within last 6 months    Recent Outpatient Visits           1 month ago Depression, recurrent   [place] [facility] [place], Megan P, DO   3 months ago Depression, recurrent   [place] [facility] [place], [place], DO   6 months ago Other specified hypothyroidism   [place] [facility] [place], Megan P, DO   8 months ago SOB (shortness of breath) on exertion   [place] [facility] [place], [place], DO

## 2024-10-03 ENCOUNTER — Other Ambulatory Visit: Payer: Self-pay | Admitting: Family Medicine

## 2024-10-03 DIAGNOSIS — E038 Other specified hypothyroidism: Secondary | ICD-10-CM

## 2024-10-03 NOTE — Telephone Encounter (Unsigned)
 Copied from CRM #[id number]. Topic: Clinical - Medication Refill >> Oct 03, 2024 10:53 AM Thersia BROCKS wrote: Medication: levothyroxine  (SYNTHROID ) 137 MCG tablet  Has the patient contacted their pharmacy? Yes (Agent: If no, request that the patient contact the pharmacy for the refill. If patient does not wish to contact the pharmacy document the reason why and proceed with request.) (Agent: If yes, when and what did the pharmacy advise?)  This is the patient's preferred pharmacy:  [id number] [place], [place] - [address] AT [facility] OF [address] [place] [address] Phone: [phone] Fax: [phone]  Is this the correct pharmacy for this prescription? Yes If no, delete pharmacy and type the correct one.   Has the prescription been filled recently? No  Is the patient out of the medication? Yes  Has the patient been seen for an appointment in the last year OR does the patient have an upcoming appointment? Yes  Can we respond through MyChart? Yes  Agent: Please be advised that Rx refills may take up to 3 business days. We ask that you follow-up with your pharmacy.

## 2024-10-05 NOTE — Telephone Encounter (Signed)
 Too soon for refill, LRF 05/23/24 for 90 and 1 RF.  Requested Prescriptions  Pending Prescriptions Disp Refills   levothyroxine  (SYNTHROID ) 137 MCG tablet 90 tablet 3    Sig: Take 1 tablet (137 mcg total) by mouth daily before breakfast.     Endocrinology:  Hypothyroid Agents Passed - 10/05/2024  4:10 PM      Passed - TSH in normal range and within 360 days    TSH  Date Value Ref Range Status  05/19/2024 1.080 0.450 - 4.500 uIU/mL Final         Passed - Valid encounter within last 12 months    Recent Outpatient Visits           2 months ago Depression, recurrent   [facility] [place], Megan P, DO   4 months ago Depression, recurrent   [place] [facility] [place], Megan P, DO   7 months ago Other specified hypothyroidism   [place] [facility] [place], Megan P, DO   9 months ago SOB (shortness of breath) on exertion   [place] [facility], Megan P, DO

## 2024-10-07 ENCOUNTER — Other Ambulatory Visit: Payer: Self-pay | Admitting: Family Medicine

## 2024-10-07 DIAGNOSIS — E038 Other specified hypothyroidism: Secondary | ICD-10-CM

## 2024-10-11 ENCOUNTER — Other Ambulatory Visit: Payer: Self-pay | Admitting: Family Medicine

## 2024-10-11 DIAGNOSIS — E038 Other specified hypothyroidism: Secondary | ICD-10-CM

## 2024-10-11 NOTE — Telephone Encounter (Signed)
 Duplicate request, refilled 05/23/24.  Requested Prescriptions  Pending Prescriptions Disp Refills   levothyroxine  (SYNTHROID ) 137 MCG tablet [Pharmacy Med Name: LEVOTHYROXINE  0.137MG  ( ) TAB] 90 tablet 3    Sig: TAKE 1 TABLET(137 MCG) BY MOUTH DAILY BEFORE BREAKFAST     Endocrinology:  Hypothyroid Agents Passed - 10/11/2024  2:58 PM      Passed - TSH in normal range and within 360 days    TSH  Date Value Ref Range Status  05/19/2024 1.080 0.450 - 4.500 uIU/mL Final         Passed - Valid encounter within last 12 months    Recent Outpatient Visits           3 months ago Depression, recurrent   [place] [facility] [place], Megan P, DO   4 months ago Depression, recurrent   [place] [facility] [place], [place], DO   8 months ago Other specified hypothyroidism   [place] [facility] [place], Megan P, DO   9 months ago SOB (shortness of breath) on exertion   [place] [facility] [place], Megan P, DO

## 2024-11-11 ENCOUNTER — Other Ambulatory Visit: Payer: Self-pay | Admitting: Family Medicine

## 2024-11-11 DIAGNOSIS — I10 Essential (primary) hypertension: Secondary | ICD-10-CM

## 2024-11-11 NOTE — Telephone Encounter (Signed)
 Requested Prescriptions  Pending Prescriptions Disp Refills   hydrochlorothiazide  (HYDRODIURIL ) 25 MG tablet [Pharmacy Med Name: HYDROCHLOROTHIAZIDE  25MG  TABLETS] 90 tablet 0    Sig: TAKE 1 TABLET(25 MG) BY MOUTH DAILY     Cardiovascular: Diuretics - Thiazide Passed - 11/11/2024 11:52 AM      Passed - Cr in normal range and within 180 days    Creatinine  Date Value Ref Range Status  10/24/2014 0.76 0.60 - 1.30 mg/dL Final   Creatinine, Ser  Date Value Ref Range Status  05/19/2024 0.79 0.57 - 1.00 mg/dL Final         Passed - K in normal range and within 180 days    Potassium  Date Value Ref Range Status  05/19/2024 3.7 3.5 - 5.2 mmol/L Final  10/24/2014 3.9 3.5 - 5.1 mmol/L Final         Passed - Na in normal range and within 180 days    Sodium  Date Value Ref Range Status  05/19/2024 139 134 - 144 mmol/L Final  10/24/2014 139 136 - 145 mmol/L Final         Passed - Last BP in normal range    BP Readings from Last 1 Encounters:  07/12/24 136/83         Passed - Valid encounter within last 6 months    Recent Outpatient Visits           4 months ago Depression, recurrent   [place] [facility] [place], Megan P, DO   5 months ago Depression, recurrent   [facility] [place], Megan P, DO   9 months ago Other specified hypothyroidism   [place] [facility] [place], Megan P, DO   10 months ago SOB (shortness of breath) on exertion    [facility] [place], Megan P, DO               venlafaxine  XR (EFFEXOR -XR) 150 MG 24 hr capsule [Pharmacy Med Name: VENLAFAXINE  ER 150MG  CAPSULES] 90 capsule 0    Sig: TAKE 1 CAPSULE BY MOUTH DAILY WITH BREAKFAST WITH 75MG  DOSE FOR A TOTAL OF 225MG  DAILY     Psychiatry: Antidepressants - SNRI - desvenlafaxine & venlafaxine  Failed - 11/11/2024 11:52 AM      Failed - Lipid Panel in normal range within the last 12 months    Cholesterol, Total  Date Value Ref  Range Status  05/19/2024 217 (H) 100 - 199 mg/dL Final   LDL Chol Calc (NIH)  Date Value Ref Range Status  05/19/2024 130 (H) 0 - 99 mg/dL Final   HDL  Date Value Ref Range Status  05/19/2024 42 >39 mg/dL Final   Triglycerides  Date Value Ref Range Status  05/19/2024 253 (H) 0 - 149 mg/dL Final         Passed - Cr in normal range and within 360 days    Creatinine  Date Value Ref Range Status  10/24/2014 0.76 0.60 - 1.30 mg/dL Final   Creatinine, Ser  Date Value Ref Range Status  05/19/2024 0.79 0.57 - 1.00 mg/dL Final         Passed - Completed PHQ-2 or PHQ-9 in the last 360 days      Passed - Last BP in normal range    BP Readings from Last 1 Encounters:  07/12/24 136/83         Passed - Valid encounter within last 6 months    Recent Outpatient  Visits           4 months ago Depression, recurrent   [place] [facility] [place], [place] P, DO   5 months ago Depression, recurrent   [place] [facility] [place], [place], DO   9 months ago Other specified hypothyroidism   [place] [facility] [place], [place] P, DO   10 months ago SOB (shortness of breath) on exertion    [facility] [place], [place], DO

## 2024-11-13 ENCOUNTER — Other Ambulatory Visit: Payer: Self-pay | Admitting: Family Medicine

## 2024-11-14 ENCOUNTER — Encounter: Admitting: Family Medicine

## 2024-11-14 NOTE — Telephone Encounter (Signed)
 Requested Prescriptions  Pending Prescriptions Disp Refills   losartan  (COZAAR ) 100 MG tablet [Pharmacy Med Name: LOSARTAN  100MG  TABLETS] 90 tablet 0    Sig: TAKE 1 TABLET(100 MG) BY MOUTH DAILY     Cardiovascular:  Angiotensin Receptor Blockers Passed - 11/14/2024  4:27 PM      Passed - Cr in normal range and within 180 days    Creatinine  Date Value Ref Range Status  10/24/2014 0.76 0.60 - 1.30 mg/dL Final   Creatinine, Ser  Date Value Ref Range Status  05/19/2024 0.79 0.57 - 1.00 mg/dL Final         Passed - K in normal range and within 180 days    Potassium  Date Value Ref Range Status  05/19/2024 3.7 3.5 - 5.2 mmol/L Final  10/24/2014 3.9 3.5 - 5.1 mmol/L Final         Passed - Patient is not pregnant      Passed - Last BP in normal range    BP Readings from Last 1 Encounters:  07/12/24 136/83         Passed - Valid encounter within last 6 months    Recent Outpatient Visits           4 months ago Depression, recurrent   [place] [facility] [place], Megan P, DO   5 months ago Depression, recurrent   [place] [facility] [place], Megan P, DO   9 months ago Other specified hypothyroidism   [place] [facility] [place], Megan P, DO   10 months ago SOB (shortness of breath) on exertion   [place] [facility] [place], Megan P, DO               meloxicam  (MOBIC ) 7.5 MG tablet [Pharmacy Med Name: MELOXICAM  7.5MG  TABLETS] 90 tablet 0    Sig: TAKE 1 TABLET(7.5 MG) BY MOUTH DAILY     Analgesics:  COX2 Inhibitors Failed - 11/14/2024  4:27 PM      Failed - Manual Review: Labs are only required if the patient has taken medication for more than 8 weeks.      Failed - ALT in normal range and within 360 days    ALT  Date Value Ref Range Status  05/19/2024 43 (H) 0 - 32 IU/L Final         Passed - HGB in normal range and within 360 days    Hemoglobin  Date Value Ref Range Status  05/19/2024 13.2 11.1 -  15.9 g/dL Final         Passed - Cr in normal range and within 360 days    Creatinine  Date Value Ref Range Status  10/24/2014 0.76 0.60 - 1.30 mg/dL Final   Creatinine, Ser  Date Value Ref Range Status  05/19/2024 0.79 0.57 - 1.00 mg/dL Final         Passed - HCT in normal range and within 360 days    Hematocrit  Date Value Ref Range Status  05/19/2024 39.9 34.0 - 46.6 % Final         Passed - AST in normal range and within 360 days    AST  Date Value Ref Range Status  05/19/2024 32 0 - 40 IU/L Final         Passed - eGFR is 30 or above and within 360 days    EGFR (African American)  Date Value Ref Range Status  10/24/2014 >60 >68mL/min Final  GFR calc Af Amer  Date Value Ref Range Status  02/28/2020 93 >59 mL/min/1.73 Final    Comment:    **[facility] currently reports eGFR in compliance with the current**   recommendations of the [employer]. [facility] will   update reporting as new guidelines are published from the NKF-ASN   Task force.    EGFR (Non-African Amer.)  Date Value Ref Range Status  10/24/2014 >60 >80mL/min Final    Comment:    eGFR values <42mL/min/1.73 m2 may be an indication of chronic kidney disease (CKD). Calculated eGFR, using the MRDR Study equation, is useful in  patients with stable renal function. The eGFR calculation will not be reliable in acutely ill patients when serum creatinine is changing rapidly. It is not useful in patients on dialysis. The eGFR calculation may not be applicable to patients at the low and high extremes of body sizes, pregnant women, and vegetarians.    GFR calc non Af Amer  Date Value Ref Range Status  02/28/2020 80 >59 mL/min/1.73 Final   eGFR  Date Value Ref Range Status  05/19/2024 83 >59 mL/min/1.73 Final         Passed - Patient is not pregnant      Passed - Valid encounter within last 12 months    Recent Outpatient Visits           4 months ago Depression, recurrent   [place]  [facility] [place], Megan P, DO   5 months ago Depression, recurrent   [place] [facility] [place], Megan P, DO   9 months ago Other specified hypothyroidism   [place] [facility] [place], Megan P, DO   10 months ago SOB (shortness of breath) on exertion    [facility] [place], Megan P, DO

## 2024-11-22 ENCOUNTER — Other Ambulatory Visit: Payer: Self-pay | Admitting: Family Medicine

## 2024-11-22 DIAGNOSIS — I10 Essential (primary) hypertension: Secondary | ICD-10-CM

## 2024-11-23 NOTE — Telephone Encounter (Signed)
 All Rx are current- refilled 1/26 and 1/23- duplicate request Requested Prescriptions  Pending Prescriptions Disp Refills   venlafaxine  XR (EFFEXOR -XR) 150 MG 24 hr capsule [Pharmacy Med Name: VENLAFAXINE  ER 150MG  CAPSULES] 90 capsule 0    Sig: TAKE 1 CAPSULE BY MOUTH DAILY WITH BREAKFAST WITH 75MG  DOSE FOR A TOTAL OF 225MG  DAILY     Psychiatry: Antidepressants - SNRI - desvenlafaxine & venlafaxine  Failed - 11/23/2024  1:40 PM      Failed - Lipid Panel in normal range within the last 12 months    Cholesterol, Total  Date Value Ref Range Status  05/19/2024 217 (H) 100 - 199 mg/dL Final   LDL Chol Calc (NIH)  Date Value Ref Range Status  05/19/2024 130 (H) 0 - 99 mg/dL Final   HDL  Date Value Ref Range Status  05/19/2024 42 >39 mg/dL Final   Triglycerides  Date Value Ref Range Status  05/19/2024 253 (H) 0 - 149 mg/dL Final         Passed - Cr in normal range and within 360 days    Creatinine  Date Value Ref Range Status  10/24/2014 0.76 0.60 - 1.30 mg/dL Final   Creatinine, Ser  Date Value Ref Range Status  05/19/2024 0.79 0.57 - 1.00 mg/dL Final         Passed - Completed PHQ-2 or PHQ-9 in the last 360 days      Passed - Last BP in normal range    BP Readings from Last 1 Encounters:  07/12/24 136/83         Passed - Valid encounter within last 6 months    Recent Outpatient Visits           4 months ago Depression, recurrent   [place] [facility] [place], Megan P, DO   6 months ago Depression, recurrent   [place] [facility] [place], Megan P, DO   9 months ago Other specified hypothyroidism   [place] [facility] [place], Megan P, DO   11 months ago SOB (shortness of breath) on exertion   [place] [facility] [place], Megan P, DO               meloxicam  (MOBIC ) 7.5 MG tablet [Pharmacy Med Name: MELOXICAM  7.5MG  TABLETS] 90 tablet 0    Sig: TAKE 1 TABLET(7.5 MG) BY MOUTH DAILY     Analgesics:   COX2 Inhibitors Failed - 11/23/2024  1:40 PM      Failed - Manual Review: Labs are only required if the patient has taken medication for more than 8 weeks.      Failed - ALT in normal range and within 360 days    ALT  Date Value Ref Range Status  05/19/2024 43 (H) 0 - 32 IU/L Final         Passed - HGB in normal range and within 360 days    Hemoglobin  Date Value Ref Range Status  05/19/2024 13.2 11.1 - 15.9 g/dL Final         Passed - Cr in normal range and within 360 days    Creatinine  Date Value Ref Range Status  10/24/2014 0.76 0.60 - 1.30 mg/dL Final   Creatinine, Ser  Date Value Ref Range Status  05/19/2024 0.79 0.57 - 1.00 mg/dL Final         Passed - HCT in normal range and within 360 days    Hematocrit  Date Value Ref Range  Status  05/19/2024 39.9 34.0 - 46.6 % Final         Passed - AST in normal range and within 360 days    AST  Date Value Ref Range Status  05/19/2024 32 0 - 40 IU/L Final         Passed - eGFR is 30 or above and within 360 days    EGFR (African American)  Date Value Ref Range Status  10/24/2014 >60 >42mL/min Final   GFR calc Af Amer  Date Value Ref Range Status  02/28/2020 93 >59 mL/min/1.73 Final    Comment:    **[facility] currently reports eGFR in compliance with the current**   recommendations of the [employer]. [facility] will   update reporting as new guidelines are published from the NKF-ASN   Task force.    EGFR (Non-African Amer.)  Date Value Ref Range Status  10/24/2014 >60 >66mL/min Final    Comment:    eGFR values <62mL/min/1.73 m2 may be an indication of chronic kidney disease (CKD). Calculated eGFR, using the MRDR Study equation, is useful in  patients with stable renal function. The eGFR calculation will not be reliable in acutely ill patients when serum creatinine is changing rapidly. It is not useful in patients on dialysis. The eGFR calculation may not be applicable to patients at the low and high  extremes of body sizes, pregnant women, and vegetarians.    GFR calc non Af Amer  Date Value Ref Range Status  02/28/2020 80 >59 mL/min/1.73 Final   eGFR  Date Value Ref Range Status  05/19/2024 83 >59 mL/min/1.73 Final         Passed - Patient is not pregnant      Passed - Valid encounter within last 12 months    Recent Outpatient Visits           4 months ago Depression, recurrent   [facility] [place], Megan P, DO   6 months ago Depression, recurrent   [place] [facility] [place], Megan P, DO   9 months ago Other specified hypothyroidism   [place] [facility] [place], Megan P, DO   11 months ago SOB (shortness of breath) on exertion   [place] [facility], Megan P, DO               losartan  (COZAAR ) 100 MG tablet [Pharmacy Med Name: LOSARTAN  100MG  TABLETS] 90 tablet 0    Sig: TAKE 1 TABLET(100 MG) BY MOUTH DAILY     Cardiovascular:  Angiotensin Receptor Blockers Failed - 11/23/2024  1:40 PM      Failed - Cr in normal range and within 180 days    Creatinine  Date Value Ref Range Status  10/24/2014 0.76 0.60 - 1.30 mg/dL Final   Creatinine, Ser  Date Value Ref Range Status  05/19/2024 0.79 0.57 - 1.00 mg/dL Final         Failed - K in normal range and within 180 days    Potassium  Date Value Ref Range Status  05/19/2024 3.7 3.5 - 5.2 mmol/L Final  10/24/2014 3.9 3.5 - 5.1 mmol/L Final         Passed - Patient is not pregnant      Passed - Last BP in normal range    BP Readings from Last 1 Encounters:  07/12/24 136/83         Passed - Valid encounter within last 6 months  Recent Outpatient Visits           4 months ago Depression, recurrent   [place] [facility] [place], [place] P, DO   6 months ago Depression, recurrent   [place] [facility] [place], Megan P, DO   9 months ago Other specified hypothyroidism   [place] [facility] [place], Megan P, DO   11 months ago SOB (shortness of breath) on exertion   [place] [facility], Megan P, DO               hydrochlorothiazide  (HYDRODIURIL ) 25 MG tablet [Pharmacy Med Name: HYDROCHLOROTHIAZIDE  25MG  TABLETS] 90 tablet 0    Sig: TAKE 1 TABLET(25 MG) BY MOUTH DAILY     Cardiovascular: Diuretics - Thiazide Failed - 11/23/2024  1:40 PM      Failed - Cr in normal range and within 180 days    Creatinine  Date Value Ref Range Status  10/24/2014 0.76 0.60 - 1.30 mg/dL Final   Creatinine, Ser  Date Value Ref Range Status  05/19/2024 0.79 0.57 - 1.00 mg/dL Final         Failed - K in normal range and within 180 days    Potassium  Date Value Ref Range Status  05/19/2024 3.7 3.5 - 5.2 mmol/L Final  10/24/2014 3.9 3.5 - 5.1 mmol/L Final         Failed - Na in normal range and within 180 days    Sodium  Date Value Ref Range Status  05/19/2024 139 134 - 144 mmol/L Final  10/24/2014 139 136 - 145 mmol/L Final         Passed - Last BP in normal range    BP Readings from Last 1 Encounters:  07/12/24 136/83         Passed - Valid encounter within last 6 months    Recent Outpatient Visits           4 months ago Depression, recurrent   [place] [facility] [place], Megan P, DO   6 months ago Depression, recurrent   [place] [facility] [place], Megan P, DO   9 months ago Other specified hypothyroidism   [place] [facility] [place], Megan P, DO   11 months ago SOB (shortness of breath) on exertion   [place] [facility] [place], [place], DO

## 2024-12-27 ENCOUNTER — Encounter: Admitting: Family Medicine

## 2024-12-29 ENCOUNTER — Encounter: Admitting: Family Medicine

## 2025-06-06 ENCOUNTER — Ambulatory Visit
